# Patient Record
Sex: Female | Born: 1965 | Race: Black or African American | Hispanic: No | Smoking: Never smoker
Health system: Southern US, Community
[De-identification: ages and names within clinical notes are randomized; demographics above are authoritative.]

## PROBLEM LIST (undated history)

## (undated) DIAGNOSIS — K259 Gastric ulcer, unspecified as acute or chronic, without hemorrhage or perforation: Secondary | ICD-10-CM

## (undated) DIAGNOSIS — Z9289 Personal history of other medical treatment: Secondary | ICD-10-CM

## (undated) DIAGNOSIS — F4310 Post-traumatic stress disorder, unspecified: Secondary | ICD-10-CM

## (undated) DIAGNOSIS — K219 Gastro-esophageal reflux disease without esophagitis: Secondary | ICD-10-CM

## (undated) DIAGNOSIS — D573 Sickle-cell trait: Secondary | ICD-10-CM

## (undated) DIAGNOSIS — E785 Hyperlipidemia, unspecified: Secondary | ICD-10-CM

## (undated) DIAGNOSIS — K561 Intussusception: Secondary | ICD-10-CM

## (undated) DIAGNOSIS — G43909 Migraine, unspecified, not intractable, without status migrainosus: Secondary | ICD-10-CM

## (undated) DIAGNOSIS — M545 Low back pain, unspecified: Secondary | ICD-10-CM

## (undated) DIAGNOSIS — I10 Essential (primary) hypertension: Secondary | ICD-10-CM

## (undated) DIAGNOSIS — F329 Major depressive disorder, single episode, unspecified: Secondary | ICD-10-CM

## (undated) DIAGNOSIS — K8020 Calculus of gallbladder without cholecystitis without obstruction: Secondary | ICD-10-CM

## (undated) DIAGNOSIS — Z8489 Family history of other specified conditions: Secondary | ICD-10-CM

## (undated) DIAGNOSIS — I509 Heart failure, unspecified: Secondary | ICD-10-CM

## (undated) DIAGNOSIS — G8929 Other chronic pain: Secondary | ICD-10-CM

## (undated) DIAGNOSIS — Z87442 Personal history of urinary calculi: Secondary | ICD-10-CM

## (undated) DIAGNOSIS — F419 Anxiety disorder, unspecified: Secondary | ICD-10-CM

## (undated) DIAGNOSIS — M1990 Unspecified osteoarthritis, unspecified site: Secondary | ICD-10-CM

## (undated) DIAGNOSIS — D509 Iron deficiency anemia, unspecified: Secondary | ICD-10-CM

## (undated) DIAGNOSIS — F32A Depression, unspecified: Secondary | ICD-10-CM

## (undated) DIAGNOSIS — M5126 Other intervertebral disc displacement, lumbar region: Secondary | ICD-10-CM

## (undated) DIAGNOSIS — J189 Pneumonia, unspecified organism: Secondary | ICD-10-CM

## (undated) HISTORY — DX: Major depressive disorder, single episode, unspecified: F32.9

## (undated) HISTORY — DX: Anxiety disorder, unspecified: F41.9

## (undated) HISTORY — DX: Calculus of gallbladder without cholecystitis without obstruction: K80.20

## (undated) HISTORY — DX: Heart failure, unspecified: I50.9

## (undated) HISTORY — DX: Depression, unspecified: F32.A

## (undated) HISTORY — DX: Hyperlipidemia, unspecified: E78.5

---

## 1997-11-13 DIAGNOSIS — Z87442 Personal history of urinary calculi: Secondary | ICD-10-CM

## 1997-11-13 HISTORY — DX: Personal history of urinary calculi: Z87.442

## 1999-01-12 ENCOUNTER — Ambulatory Visit (HOSPITAL_COMMUNITY): Admission: RE | Admit: 1999-01-12 | Discharge: 1999-01-12 | Payer: Self-pay | Admitting: *Deleted

## 1999-01-26 ENCOUNTER — Encounter: Payer: Self-pay | Admitting: *Deleted

## 1999-01-26 ENCOUNTER — Ambulatory Visit (HOSPITAL_COMMUNITY): Admission: RE | Admit: 1999-01-26 | Discharge: 1999-01-26 | Payer: Self-pay | Admitting: *Deleted

## 1999-03-11 ENCOUNTER — Inpatient Hospital Stay (HOSPITAL_COMMUNITY): Admission: AD | Admit: 1999-03-11 | Discharge: 1999-03-11 | Payer: Self-pay | Admitting: *Deleted

## 1999-03-12 ENCOUNTER — Inpatient Hospital Stay (HOSPITAL_COMMUNITY): Admission: AD | Admit: 1999-03-12 | Discharge: 1999-03-14 | Payer: Self-pay | Admitting: *Deleted

## 1999-06-22 ENCOUNTER — Inpatient Hospital Stay (HOSPITAL_COMMUNITY): Admission: AD | Admit: 1999-06-22 | Discharge: 1999-06-22 | Payer: Self-pay | Admitting: *Deleted

## 1999-07-15 HISTORY — PX: TUBAL LIGATION: SHX77

## 1999-07-17 ENCOUNTER — Inpatient Hospital Stay (HOSPITAL_COMMUNITY): Admission: AD | Admit: 1999-07-17 | Discharge: 1999-07-21 | Payer: Self-pay | Admitting: *Deleted

## 1999-07-17 ENCOUNTER — Encounter (INDEPENDENT_AMBULATORY_CARE_PROVIDER_SITE_OTHER): Payer: Self-pay | Admitting: Specialist

## 1999-07-21 ENCOUNTER — Encounter (HOSPITAL_COMMUNITY): Admission: RE | Admit: 1999-07-21 | Discharge: 1999-10-19 | Payer: Self-pay | Admitting: *Deleted

## 1999-10-22 ENCOUNTER — Encounter (HOSPITAL_COMMUNITY): Admission: RE | Admit: 1999-10-22 | Discharge: 2000-01-20 | Payer: Self-pay | Admitting: *Deleted

## 2000-02-20 ENCOUNTER — Encounter: Admission: RE | Admit: 2000-02-20 | Discharge: 2000-05-20 | Payer: Self-pay | Admitting: *Deleted

## 2000-03-16 ENCOUNTER — Emergency Department (HOSPITAL_COMMUNITY): Admission: EM | Admit: 2000-03-16 | Discharge: 2000-03-16 | Payer: Self-pay | Admitting: Emergency Medicine

## 2000-03-16 ENCOUNTER — Encounter: Payer: Self-pay | Admitting: Emergency Medicine

## 2000-05-21 ENCOUNTER — Encounter: Admission: RE | Admit: 2000-05-21 | Discharge: 2000-08-19 | Payer: Self-pay | Admitting: *Deleted

## 2000-08-21 ENCOUNTER — Encounter: Admission: RE | Admit: 2000-08-21 | Discharge: 2000-11-19 | Payer: Self-pay | Admitting: *Deleted

## 2000-08-24 ENCOUNTER — Emergency Department (HOSPITAL_COMMUNITY): Admission: EM | Admit: 2000-08-24 | Discharge: 2000-08-24 | Payer: Self-pay | Admitting: Emergency Medicine

## 2000-11-21 ENCOUNTER — Encounter: Admission: RE | Admit: 2000-11-21 | Discharge: 2001-01-01 | Payer: Self-pay | Admitting: *Deleted

## 2001-03-27 ENCOUNTER — Ambulatory Visit (HOSPITAL_COMMUNITY): Admission: RE | Admit: 2001-03-27 | Discharge: 2001-03-27 | Payer: Self-pay | Admitting: Family Medicine

## 2001-03-27 ENCOUNTER — Encounter: Payer: Self-pay | Admitting: Family Medicine

## 2003-11-14 HISTORY — PX: LAPAROSCOPIC GASTRIC BYPASS: SUR771

## 2006-03-31 ENCOUNTER — Emergency Department (HOSPITAL_COMMUNITY): Admission: EM | Admit: 2006-03-31 | Discharge: 2006-04-01 | Payer: Self-pay | Admitting: Emergency Medicine

## 2006-11-13 HISTORY — PX: HERNIA REPAIR: SHX51

## 2006-11-13 HISTORY — PX: LAPAROSCOPIC TRANSABDOMINAL HERNIA: SHX5933

## 2007-04-15 ENCOUNTER — Inpatient Hospital Stay (HOSPITAL_COMMUNITY): Admission: EM | Admit: 2007-04-15 | Discharge: 2007-04-23 | Payer: Self-pay | Admitting: Emergency Medicine

## 2007-04-15 ENCOUNTER — Ambulatory Visit: Payer: Self-pay | Admitting: Hematology & Oncology

## 2007-04-26 ENCOUNTER — Ambulatory Visit: Payer: Self-pay | Admitting: Hematology & Oncology

## 2011-03-28 NOTE — Discharge Summary (Signed)
Julia Wheeler, ORNE                 ACCOUNT NO.:  [id number]   MEDICAL RECORD NO.:  [id number]          PATIENT TYPE:  INP   LOCATION:  5734                         FACILITY:  [facility]   PHYSICIAN:  Colin Benton, M.D.            DATE OF BIRTH:  02/12/1966   DATE OF ADMISSION:  04/14/2007  DATE OF DISCHARGE:  04/23/2007                               DISCHARGE SUMMARY   OPERATIVE PHYSICIAN:  Dr. Michaell Cowing.   DISCHARGING PHYSICIAN:  Dr. Colin Benton.   CONSULTANTS:  Dr. Myna Hidalgo with hematology.   CHIEF COMPLAINT/REASON FOR ADMISSION:  [name] is a 45 year old  female with a prior history of gastric bypass surgery with laparoscopic  approach in 2005.  She presented with two days of abrupt onset  intermittent crampy abdominal pain in the left upper abdomen radiating  to her back.  She has not passed any flatus since the onset of symptoms.  She presented to the ER because of her severe pain.  A CT of the abdomen  and pelvis revealed distal small bowel dilatation as well as small bowel  dilatation in the left abdomen with the largest diameter being 4.2 cm.  Surgical consultation was requested for admission.   PHYSICAL EXAMINATION:  VITAL SIGNS:  Stable.  She was afebrile.  ABDOMEN:  Slightly distended but soft.  No bowel sounds were  auscultated.  An NG tube had been placed by the ER staff but without a  significant amount of volume noted.  The patient had just recently  vomited.  There were no obvious herniations or scars in the abdominal  region.   LABORATORY DATA:  The patient's lab work was within normal limits except  for an elevated neutrophil count of 82%.  Urinalysis was positive for  nitrite, leukocyte esterase negative, wbc's in the urine 3-6, bacteria  many.  Urine culture pending.   DIAGNOSES:  The patient was admitted with the following diagnoses.  1. Small bowel obstruction with history of gastric bypass surgery.      Rule out adhesions versus internal hernia at bypass site.  2. Urinary  tract infection.  3. Volume depletion due to nausea and vomiting.  4. Anemia of uncertain etiology.   HOSPITAL COURSE:  The patient was admitted to the general floor where  she was kept on bowel rest, NG tube decompression, IV fluids. Labs were  checked as noted.  The patient did have abnormal hemoglobin of 8 on  admission, and after hydration hemoglobin dropped to 6.7.  The patient  was queried about this as a chronic problem.  She states she has been  diagnosed with anemia in the past and was supposed to be taking iron  pills, but had not.  She had also been fatigued at home.  She does have  sickle cell trait and denied any obvious sources of bleeding from the  gastrointestinal tract so hematology was consulted.   Dr. Myna Hidalgo did evaluate the patient, and basic workup  was initiated  per the hematology practice.  Initially they felt that the patient would  not need a  blood transfusion since she was hemodynamically stable with  normal blood pressure and normal heart rate.   In regards to patient's bowel obstruction after the first three days of  admission the patient seemed to be improving.  Her x-rays had improved  slightly.  She was passing flatus so her NG tube was clamped.  She had  also been placed on Cipro empirically for her UTI.  By hospital day #4  the patient's x-rays really had not improved when compared over the time  span.  She still had significant amount of small bowel that was dilated  up in the left upper quadrant, although the distal bowel obstruction  appeared to be resolved.  Therefore, after discussing with the patient  Dr. Michaell Cowing opted to take her to the OR where he did a laparoscopic repair  of internal hernia secondary to the patient's history of gastric bypass  procedure.  There were no adhesions that had to be lysed, and the  patient, otherwise, did well from this procedure.  The patient's  hemoglobin preoperatively was 7, and this stayed stable during the   immediate postoperative period.   In the postoperative period the patient did have an NG tube in place  because she had a mild postoperative ileus, but this was resolved by  postop day #3 and the patient had been started on a clear liquid diet.  She tolerated clear liquids well, but was tired of the sweet taste of  this type of diet so on postoperative day #4 she was advanced to a soft  diet and tolerated this well.   On postop day #4 hematology reevaluated the patient and now that she had  fully recovered from her bowel obstructive issues we were more able to  clarify if she was symptomatic from the anemia.  She reported to  Dr. Myna Hidalgo that she felt lousy.  She was dizzy upon standing and since  her hemoglobin was remaining at 7,  Dr. Myna Hidalgo felt that the patient would benefit from a blood transfusion  so on the evening of postoperative day  #4 the patient did receive 2 units of packed red blood cells.  She was  also treated with Tylenol, Benadryl, and Phenergan for any symptoms  related to the blood transfusion.   By postoperative day #5 the patient's hemoglobin had increased to 9.7  and she felt much better.  She had been tolerating the soft diet  advanced and is, otherwise, deemed appropriate for discharge home.  The  patient's urine culture it is important to note that was positive for  pansensitive E. coli.  The patient had been treated with the  fluoroquinolone since admission, and it was felt that she would not need  to continue any antibiotics after discharge since she was having no  symptoms of dysuria.   FINAL DISCHARGE DIAGNOSES:  1. Small bowel obstruction secondary to internal hernia x2 at her      gastric bypass anastomosis site.  2. Iron deficiency anemia in a patient with sickle cell trait status      post 2 units of packed red blood cells this admission.  3. Postoperative ileus resolved. 4. Pansensitive Escherichia coli urinary tract infection.   DISCHARGE  MEDICATIONS:  The patient will continue her multiple vitamin  and calcium that she was taking prior to admission.  She will be taking  Vicodin 5/325 1-2 tabs every four hours as needed for pain.  At this  point Dr. Myna Hidalgo does not wish to  initiate iron therapy.  Return to  work Monday, June 16.   DIET:  No restrictions.   WOUND CARE:  Remove bandages.   ACTIVITY:  Increase activity slowly.  May walk up steps.  May shower for  the next one week and then may begin tub baths after that if that is how  you normally bathe.  No lifting for one week more than 15 pounds.   FOLLOW-UP APPOINTMENTS:  1. You need to see Dr. Michaell Cowing, telephone number [phone].  You need to      call to be seen in two weeks.  2. You need to follow up with Dr. Myna Hidalgo, telephone number [phone].      You need to call to be seen in four weeks.      Allison L. Rolene Course.    ______________________________  Colin Benton, M.D.    ALE/MEDQ  D:  04/23/2007  T:  04/23/2007  Job:  [id number]   cc:   Ardeth Sportsman, MD  Rose Phi. Myna Hidalgo, M.D.

## 2011-03-28 NOTE — Consult Note (Signed)
NAMEVIRDIA, [name]                 ACCOUNT NO.:  [id number]   MEDICAL RECORD NO.:  [id number]          PATIENT TYPE:  INP   LOCATION:  5703                         FACILITY:  [facility]   PHYSICIAN:  Rose Phi. Myna Hidalgo, M.D. DATE OF BIRTH:  1966-10-29   DATE OF CONSULTATION:  04/17/2007  DATE OF DISCHARGE:                                 CONSULTATION   REFERRING PHYSICIAN:  Dr. Michaell Cowing of  [facility].   REASON FOR CONSULTATION:  1. Iron deficiency anemia.  2. Gastric bypass.  3. Sickle cell trait.   HISTORY OF PRESENT ILLNESS:  Julia Wheeler is a very nice 45 year old black  female who had gastric bypass in [place], [place], about 3 years  ago.  She has since moved to [place], [place], and she has not  had any kind of followup.  She has no family doctor.   She does have history of sickle cell trait.  She said that her brother  has it.   She has been chewing a lot of ice for the past several weeks.  She has  had heavy menstrual cycles for the past couple of years.  She is not a  vegetarian. Her appetite has been pretty good until recently, when she  began to have more abdominal pain.  She basically presented with  increasing abdominal pain.  She went to the emergency room.  She was  found to have small bowel dilation by CT scan.  She felt that there was  maybe a partial small bowel obstruction, likely from her gastric bypass  and adhesions.   On admission, she had a CBC with white blood cell count of 5.3,  hemoglobin 8, hematocrit 27, platelet count 341.  MCV 62.  She had a CBC  on April 16, 2007.  Her hemoglobin was 6.7, hematocrit 21.8.  Her MCV was  61.  The patient had an anemia panel done on April 16, 2007.  She had a  ferritin of 1, iron saturation 3%.  Her B12 and serum folate were  normal.  Her percent reticulocyte count when corrected for hemoglobin  was less than 0.2%.   She has had gastric aspirates from her NG tube, which have shown  positivity on two  occasions, and negative on one.   She has not been taking her iron pills that she had been supposed to.  She says that they cause constipation.   Note she also has an E. coli urinary tract infection on this admission.   We are asked to see her to help with the anemia.   She has about a 140 pound weight loss since her gastric bypass.   PAST MEDICAL HISTORY:  1. Morbid obesity, status post gastric bypass.  2. History of nephrolithiasis.  3. Sickle cell trait.   ALLERGIES:  None.   ADMISSION MEDICATIONS:  Calcium daily, multivitamins daily.  In the  hospital, she is on Cipro 400 mg IV b.i.d., Protonix 40 mg IV q.12  hours, Toradol as needed.   SOCIAL HISTORY:  Negative for tobacco use.  There is no significant  alcohol  use.  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  She has 3 children.   FAMILY HISTORY:  Remarkable for CVA.  Again, there is history of sickle  cell trait in the family.  She is not sure if anybody has iron  deficiency anemia.   REVIEW OF SYSTEMS:  As stated in the history of present illness.   PHYSICAL EXAMINATION:  GENERAL:  This is a fairly well-developed, well-  nourished black female in no obvious distress.  VITAL SIGNS:  Temperature 99.5, pulse is 89, respiratory rate 18, blood  pressure 140/83.  Weight is 140.  HEAD AND NECK:  Head shows a normocephalic, atraumatic skull.  There are  no ocular or oral lesions.  She does have pale conjunctivae.  Neck  supple, with no adenopathy.  Her thyroid is not palpable.  LUNGS:  Clear bilaterally.  CARDIAC:  Regular rate and rhythm.  Normal S1 and S2.  No murmurs, rubs,  or bruits.  ABDOMEN:  She has slightly distended abdomen.  She has occasional bowel  sounds.  There is some slight guarding.  No obvious palpable  hepatosplenomegaly is noted.  EXTREMITIES:  She has no clubbing, cyanosis or edema.  SKIN:  Examination shows no ecchymosis or petechiae.  NEUROLOGIC:  No obvious  neurological deficits.   LABORATORY DATA:  Additional laboratory studies show sodium of 132,  potassium 3.9, BUN 3, creatinine 0.7.  Total protein 7.8, albumin 3.9,  calcium 8.8.   Peripheral smear does show marked microcytic red blood cells.  She has  hypochromic red blood cells.  There is anisocytosis and poikilocytosis.  White cells appear normal in morphology and maturation.  There are no  immature myeloid cells.  There are no hypersegmented polynuclears.  Platelets are adequate in number and size.   IMPRESSION:  [name] is a 45 year old African American female with  sickle cell trait.  She has gastric bypass.  She now has partial small  bowel obstruction.  She has marked iron deficiency anemia.   She is a good candidate for IV iron.  IV iron is certainly the best, and  in my opinion, the only way to replace her iron stores.  She has marked  depletion of her iron stores.  This certainly has been a process going  on for several months, if not a couple of years.  She certainly is  adjusted to the low hemoglobin.  I do not think that she needs to have  blood transfusion at this point.  She is hemodynamically stable.   We will go ahead and give her IV iron on April 18, 2007.  Her total iron  replacement dose is 1900 mg.   I spoke with [name] and her husband.  They are both very nice.  They  do understand the situation.  I explained IV iron and how it is given.  I told [name] that she likely will not notice any benefit probably  for about a week or so.  Hopefully she will stop chewing ice, and begin  to have more energy.   We will certainly follow her along.  I do not see any need for any kind  of bone marrow test on her at this point.  All the laboratory studies  and her clinical scenario clearly indicate iron-deficiency anemia.   Will certainly follow up with [name] and plan to see her as an  outpatient.      Theron Arista  Tacy Dura, M.D. Electronically  Signed     PRE/MEDQ  D:  04/17/2007  T:  04/18/2007  Job:  [id number]   cc:   Ardeth Sportsman, MD

## 2011-03-28 NOTE — H&P (Signed)
NAMEJASLINE, Julia Wheeler NO.:  [id number]   MEDICAL RECORD NO.:  [id number]          PATIENT TYPE:  EMS   LOCATION:  MAJO                         FACILITY:  [facility]   PHYSICIAN:  Ardeth Sportsman, MD     DATE OF BIRTH:  10/16/1966   DATE OF ADMISSION:  04/14/2007  DATE OF DISCHARGE:                              HISTORY & PHYSICAL   CHIEF COMPLAINT:  Abdominal pain, probable SBO s/p Lap Gastric Bypass.   HISTORY OF PRESENT ILLNESS:  Ms. Chandonnet is a 45 year old female patient,  history of prior gastric surgery, laparoscopic in 2005.  About 2 days  ago, she developed abrupt onset of intermittent crampy abdominal pain,  more localized to the left abdomen, radiating towards her back.  She has  not passed any flatus.  She has not had any diarrhea.  Just prior to the  onset of these symptoms, she had noticed an increase of frequency in her  BMs.  She normally has 1 BM a day.  She was significant nausea and  vomiting and because of this and severe pain, she presented to the ER.  CT scan of the abdomen and pelvis was performed and demonstrated distal  small bowel dilatation, as well as small bowel dilatation in the left  abdomen with the largest diameter being 4.2 cm.  Based on these  findings, surgical consultation was requested.   PAST SURGICAL HISTORY:  1. Gastric bypass with laparoscopic approach in 2005, [facility].  Patient does not recall surgeon's name at this time.  2. Tubal ligation.   PAST MEDICAL HISTORY:  Renal calculi.  Chronic anemia.   FAMILY HISTORY:  Both parents have history of CVA.   SOCIAL HISTORY:  No alcohol.  No tobacco.  She works as a Psychologist, occupational.   DRUG ALLERGIES:  NKDA.   CURRENT MEDICATIONS:  Include:  1. Calcium daily.  2. Multivitamin daily.   REVIEW OF SYSTEMS:  Noncontributory, except for the following.  Eyes,  ENT, cardiac, respiratory, lymph, heme, heaptic, renal, endocrine,  neurological,  muskuloskeletal, allergic, dermatologic, gynecologic,  breast otherwise negative. GI as above. She has no problems related to  chronic nausea and vomiting.  No hematochezia, melena, hematemesis. She  tolerates normal sized portions at meals.  She has recently noticed an  unusual smell to her urine.   PHYSICAL EXAMINATION:  GENERAL:  A sleepy female patient, who when  aroused complains of severe abdominal pain.  VITAL SIGNS:  Temp 97.4.  BP 116/82.  Respirations 16.  Pulse 63.  NEURO:  Patient is awake.  She is oriented x3, moving all extremities x4  without any obvious focal neurological deficits.  ENT:  Head normocephalic.  Mucus membrances dry  Eyes: Sclerae noninjected, nonicteric.  PERRL, EOMI  NECK:  Supple.  No masses  Lymph: No adenopathy in head/neck/axillae/groins.  CHEST:  Bilateral lung sounds are clear to auscultation.  Respiratory  effort is nonlabored.  CARDIAC:  S1, S2 without obvious rubs, murmurs, thrills or gallops.  No  JVD.  Pulse is regular.  ABDOMEN:  Soft.  Slightly distended.  No bowel sounds are auscultated.  She has an NG tube that has been placed by the ER staff.  This is to  intermittent suction.  There is some yellow, brownish return noted,  small amount.  Patient has recently vomited.  No obvious herniations or  scars in the abdominal region. No peritonitis.  GEN: NEFM, no VB/VD  EXTREMITIES:  Symmetrical in appearance without edema, cyanosis or  clubbing.  MS:  FROM bilteral upper and lower extremities.   LAB/X-RAYS:  Sodium 137, potassium 4.0, CO2 26, BUN 9, creatinine 0.72,  glucose 135.  LFTs are normal.  White count 5300 with a neutrophil count  of 82%, hemoglobin is 8.0, hematocrit 27.0.  Urinalysis is positive for  nitrate.  Leukocyte esterase is negative.  White blood cell in the urine  is 3-6, bacteria is many.  A urine culture is pending.  CT scan as per  HPI.   IMPRESSION:  1. Small bowel obstruction in patient with prior history of  gastric      bypass surgery, possibly due to adhesions.  2. Urinary tract infection.  Urine culture pending.  3. Volume depletion secondary to nausea and vomiting.  4. Anemia.  Probably secondary to history of gastric bypass surgery.   PLAN:  1. Admit patient to general surgical services.  2. Bowel rest with NG tube, n.p.o. status and IV fluid hydration.  3. Cipro IV for urine infection.  4. Symptomatic treatment, i.e. Tylenol and Dilaudid for pain, Zofran      for nausea.  5. Because of the anemia, this is not a reported problem from the      patient, we will go ahead and check anemia indices and once she      begins having bowel movements we can consider checking stool for      blood, although with an obstructive pattern, patient may have a      false/positive for occult blood because of the processes related to      bowel obstruction.  6. Again, we will continue bowel rest for, at least, 3 days and check      plain films accordingly.      Allison L. Eliott Nine, MD  Electronically Signed    ALE/MEDQ  D:  04/15/2007  T:  04/15/2007  Job:  [id number]

## 2011-03-28 NOTE — Discharge Summary (Signed)
Julia Wheeler, Julia Wheeler                 ACCOUNT NO.:  [id number]   MEDICAL RECORD NO.:  [id number]          PATIENT TYPE:  INP   LOCATION:  5734                         FACILITY:  [facility]   PHYSICIAN:  Alfonse Ras, MD   DATE OF BIRTH:  Apr 28, 1966   DATE OF ADMISSION:  04/14/2007  DATE OF DISCHARGE:  04/23/2007                               DISCHARGE SUMMARY   DISCHARGING PHYSICIAN:  Alfonse Ras, M.D.   OPERATIVE PHYSICIAN:  Ardeth Sportsman, M.D.   CONSULTANTS:  Rose Phi. Myna Hidalgo, M.D. with hematology.   CHIEF COMPLAINT/REASON FOR ADMISSION:  [name] is a 40-year female  with history of prior gastric bypass surgery who 2 days prior to  admission developed intermittent crampy abdominal pain.  She presented  to the ER and on CT scan was found to have distal small bowel  obstruction as well as dilatation of small bowel in the left upper  abdomen with the largest diameter being 4.2 cm.  She was admitted by  surgery with a small bowel obstruction.  On initial exam, she was  afebrile, vital signs stable.  No bowel sounds.  Abdomen was slightly  distended.  Her labs were normal except for a hemoglobin of 8.0.  The  patient was admitted with the following diagnoses:  1. Small bowel obstruction in patient with a history of prior gastric      bypass surgery.  2. Urinary tract infection.  3. Volume depletion.  4. Anemia.   HOSPITAL COURSE:  The patient was admitted to the general floor where  she was kept on IV fluids, NG tube for bowel decompression and bowel  rest.  She had been started empirically on Cipro for a UTI, and after  hydration, the patient's hemoglobin fell to 6.7.  Because of this, I  talked additionally with the patient.  She states she has had a history  of microcytic anemia and has been on iron pills before but has not taken  them.  She has not had any history of any symptoms consistent with a GI  bleed and does have a history of sickle cell trait.  Because of  these  findings, hematology was consulted.  Dr. Myna Hidalgo evaluated the patient.   In regards to the patient's bowel obstruction, after about 4 days in the  hospital, she really was not improving much and her x-ray still  continued to show small bowel dilatation in the right upper quadrant.  Because of these findings, Dr. Michaell Cowing discussed with the patient the need  for surgical intervention, and she was taken to the OR on April 18, 2007  where she underwent a laparoscopic reduction and closure of internal  hernia secondary to internal hernias at her gastric bypass anastomosis  site.   In the immediate postoperative period, the patient had an NG tube in  place and had a mild postoperative ileus.  This resolved by  postoperative day #3, where she was started on clear liquids, and by  postop day #4, she was tolerating clears but did not like the taste.  She was passing flatus,  so she was advanced to a soft diet.  By  postoperative day #5, from a surgical standpoint, the patient was deemed  appropriate for discharge home.   In regards to the patient's anemia, as noted, Dr. Myna Hidalgo evaluated the  patient.  Her anemia indices were abnormal with a ferritin of 1, iron of  13, TIBC 385, MCV was in the mid to low 60s.  The patient initially  seemed to be tolerating her anemia well without any hypotension or  tachycardia, although it was difficult to a determine if she was having  any other symptoms because she was also feeling poorly from the bowel  obstruction and recent surgery.  On postoperative day #4, Dr. Myna Hidalgo  did come by and evaluate the patient in the afternoon, and she was  complaining of feeling lousy, having headaches and vertigo, and since  her hemoglobin remained stable around 7, he felt that at this point she  would probably benefit from transfusion before being discharged home.  She did receive 2 units of packed red blood cells on postoperative day  #4, and by postoperative day #5,  her hemoglobin was up to 9.7 and she  felt much better.  At this point, Dr. Myna Hidalgo did not feel that the  patient needed any oral iron therapy at home and was to follow up with  him in 1 month in the office.   In regards to the patient's UTI, she had been placed empirically on  Cipro IV.  Cultures subsequently grew pansensitive E-coli.  The patient  remained on the fluoroquinolone until date of discharge.  Since she was  asymptomatic and she had been on antibiotics for greater than 7 days, it  was felt that the UTI was adequately treated.   DISCHARGE DIAGNOSES:  1. Abdominal pain and small bowel obstruction secondary to internal      hernias at gastric bypass anastomosis site.  2. Status post laparoscopic assisted reduction and closure of internal      hernias x2.  3. Chronic microcytic anemia status post packed red blood cell      transfusion.  4. Escherichia coli urinary tract infection, pansensitive.   DISCHARGE MEDICATIONS:  1. The patient will continue the multiple vitamin and calcium she was      on prior to admission.  2. Vicodin 5/325, 1-2 tablets every 4 hours as needed for pain.   Return to work Monday, April 29, 2007.   DIET:  No restrictions.   WOUND CARE:  Remove bandages.   ACTIVITY:  Increase activity slowly.  May walk up steps.  May shower.  No lifting for 1 week greater than 15 pounds.   FOLLOWUP:  1. Dr. Michaell Cowing, [phone].  Call to be seen in 2 weeks.  2. Dr. Myna Hidalgo, [phone].  Call to be seen in 4 weeks.      Allison L. Rolene Course      Alfonse Ras, MD  Electronically Signed   ALE/MEDQ  D:  04/23/2007  T:  04/23/2007  Job:  [id number]   cc:   Ardeth Sportsman, MD  Rose Phi Myna Hidalgo, M.D.

## 2011-03-28 NOTE — Op Note (Signed)
Julia Wheeler, Julia Wheeler                 ACCOUNT NO.:  [id number]   MEDICAL RECORD NO.:  [id number]          PATIENT TYPE:  INP   LOCATION:  5703                         FACILITY:  [facility]   PHYSICIAN:  Ardeth Sportsman, MD     DATE OF BIRTH:  1966-09-06   DATE OF PROCEDURE:  DATE OF DISCHARGE:                               OPERATIVE REPORT   SURGEON:  Ardeth Sportsman, MD.   ASSISTANT SURGEON:  None.   PREOPERATIVE DIAGNOSIS:  Small bowel obstruction with a history of  laparoscopic gastric bypass.   POSTOPERATIVE DIAGNOSIS:  1. Small bowel obstruction with a history of laparoscopic gastric      bypass.  2. Internal hernias x two, incarcerated.   PROCEDURE PERFORMED:  1. Diagnostic laparoscopy.  2. Reduction of internal hernias and closure of mesenteric hernia      defects.   ANESTHESIA:  1. General anesthesia.  2. Local anesthetic in a field block around all port sites.   SPECIMENS:  None.   DRAINS:  None.   ESTIMATED BLOOD LOSS:  Less than 5 mL.   COMPLICATIONS:  None apparent.   INDICATIONS FOR PROCEDURE:  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  This was done in [facility] and she has moved to the area more recently.  I do not think she  has obtained a primary care physician at this time.  She had noted some  abdominal pain, nausea and vomiting and was admitted a few days ago on  our service for bowel obstruction.  Unfortunately, she did not improve.  Because nonoperative management had not improved with IV fluid and p.o.  nasogastric decompression, the options were discussed and recommendation  was made for a diagnostic laparoscopy with possible lysis of adhesions  and possible revision of anastomoses, possible closure and repair of  internal hernias.  Other differential diagnoses were discussed.   The risks, such as stroke, heart attack, deep venous  thrombosis,  pulmonary embolism and death were discussed.  Risks such as bleeding,  need for transfusion, wound infection, abscess, injury to other organs,  bowel injury, reoperation, incisional hernia, conversion to open, and  prolonged pain and other risks were discussed.  Questions were answered  and the patient agreed to proceed.   OPERATIVE FINDINGS:  The patient did not really have any significant  adhesions.  She did have a mesenteric defect from her antecolic limb of  the gastro jejunostomy to the jejuno jejunostomy.  That did not seem to  be particularly incarcerated, although there were some small bowel loops  within it.  I think the area of concern was the jejunal mesenteric  defect from the other limb of the jejuno jejunostomy.  There was  otherwise no evidence of any significant abnormalities in her abdomen.   DESCRIPTION OF PROCEDURE:  Informed consent was confirmed.  The patient  was already on IV antibiotics.  She had sequential compression devices  active during the entire case.  She underwent general  anesthesia without  difficulty.  She underwent general anesthesia without difficulty.  She  was positioned supine with both arms tucked.  A Foley catheter had been  placed.  Her abdomen was prepped and draped in the sterile fashion.   Entry was gained into the abdomen;  I initially attempted through  optical entry with the patient in steep reverse Trendelenburg, right  side up, through a stab incision and using a 5 mm / 0-degree scope.  However, on attempting, I could not see the planes very well and was not  comfortable with the amount of the pressure that was needed to help  delineate the planes.  I had stayed within the anterior abdominal wall  and therefore I decided to enter through a Veress technique.   An infraumbilical curvilinear incision was made.  A towel clamp was used  to hold the umbilical stalk for fascial countertraction.  The Veress was  passed into the  abdomen on the first pass easily and there was no  aspiration of blood or bile and it flushed well.  Capnoperitoneum to 15  mm Hg provided good abdominal insufflation.  A 5 mm port was placed.  Camera inspection revealed no intraabdominal injury and no evidence of  any bruising or injury from the attempted optical entry in the right  upper quadrant.  Under direct visualization, 5 mm ports were placed in  the right upper quadrant and in the left midabdomen.   Obviously dilated loops could be seen on the right side, and it was not  easy to tell the exact transition point.  Therefore, I decided to run  the bowel from the ileocecal valve at the terminal ileum more proximally  to help untwist things.  Small bowel was run and there were no interloop  adhesions.  I came to the jejunojejunostomy and, getting close to the  jejuno jejunostomy, it was obvious that there were loops of bowel that  were tucked within an internal hernia, primarily between the jejunal  mesenteric defect from the jejunojejunostomy.  Ultimately this was  reduced and run back all the way.  The biliopancreatic limb was run all  the way to the ligament of Treitz and appeared to be normal.  The  gastrojejunal limb was run from the jejunojejunostomy all the way to the  gastrojejunostomy and was seen to be nice and intact.  There was obvious  mesenteric defect between the gastrojejunostomy and the  jejunojejunostomy, although there did not seem to the source of  incarceration.  There was boggy dilated bowel, but there was no evidence  of any blood or any ischemia or necrosis.   Mesenteric defects were closed using a 2-0 silk running stitch, first  from the gastrojejunostomy all the way to the jejunojejunostomy and the  antecolic limb, taking it back to help secure that antecolic small bowel  limb to the stomach, transverse colon, and ultimately the  retroperitoneum to help close that defect down well.  The jejunal mesenteric  defect was closed from the jejunojejunostomy down to the  retroperitoneum.   The small bowel was again run from the terminal ileum to the jejuno  jejunostomy with no twisting or turning or incarceration.  Both limbs  were run from the jejunojejunostomy to the ligament of Treitz on one  limb, and the jejunojejunostomy to the gastrojejunostomy, and there was  no twisting or turning or incarceration.  This seemed to show adequate  closure of the internal hernias.  Camera inspection revealed no  intraabdominal injury, no leaking of bowel, no evidence of any bowel  injury or serosal tears.  The upper two abdominal ports were removed  with no evidence of leaking of blood on the peritoneal or skin side.   The capnoperitoneum was completely evacuated.  All ports were 5 mm and  there was no fascial defect requiring any fascial closure, and therefore  the skin was closed using a 4-0 Monocryl stitch.  A sterile dressing was  applied.  The patient was extubated and sent to the recovery room in  stable condition.  I made attempts to try and reach the family, but I  cannot get a hold of anybody.  I will continue to try and do that.      Ardeth Sportsman, MD  Electronically Signed     SCG/MEDQ  D:  04/18/2007  T:  04/18/2007  Job:  [id number]

## 2011-08-31 LAB — BASIC METABOLIC PANEL
BUN: 3 — ABNORMAL LOW
BUN: 4 — ABNORMAL LOW
BUN: 45 — ABNORMAL HIGH
BUN: 5 — ABNORMAL LOW
CO2: 30
Calcium: 8.5
Calcium: 8.8
Chloride: 100
Chloride: 102
Chloride: 103
Creatinine, Ser: 0.74
Creatinine, Ser: 0.89
GFR calc Af Amer: >60
GFR calc non Af Amer: >60
GFR calc non Af Amer: >60
Glucose, Bld: 100 — ABNORMAL HIGH
Glucose, Bld: 90
Glucose, Bld: 94
Glucose, Bld: 98
Potassium: 4.1
Sodium: 132 — ABNORMAL LOW
Sodium: 136

## 2011-08-31 LAB — CBC
HCT: 24.4 — ABNORMAL LOW
Hemoglobin: 6.7 — CL
Hemoglobin: 8 — ABNORMAL LOW
MCHC: 29.7 — ABNORMAL LOW
MCHC: 30
MCV: 62.5 — ABNORMAL LOW
MCV: 63.2 — ABNORMAL LOW
MCV: 68.3 — ABNORMAL LOW
MCV: 87.3
Platelets: 239
Platelets: 272
Platelets: 341
RBC: 4.32
RDW: 17.5 — ABNORMAL HIGH
RDW: 18.4 — ABNORMAL HIGH
WBC: 4.6
WBC: 6
WBC: 8.1

## 2011-08-31 LAB — CROSSMATCH: ABO/RH(D): O POS

## 2011-08-31 LAB — COMPREHENSIVE METABOLIC PANEL
ALT: 19
CO2: 26
Calcium: 9.5
GFR calc non Af Amer: >60
Glucose, Bld: 135 — ABNORMAL HIGH
Sodium: 137
Total Bilirubin: 0.6

## 2011-08-31 LAB — URINALYSIS, ROUTINE W REFLEX MICROSCOPIC
Hgb urine dipstick: NEGATIVE
Leukocytes, UA: NEGATIVE
Nitrite: POSITIVE — AB

## 2011-08-31 LAB — DIFFERENTIAL
Basophils Absolute: 0
Eosinophils Absolute: 0
Lymphocytes Relative: 14
Lymphs Abs: 0.7
Monocytes Absolute: 0.2
Neutro Abs: 4.4

## 2011-08-31 LAB — HEMOGLOBIN AND HEMATOCRIT, BLOOD
HCT: 22.6 — ABNORMAL LOW
HCT: 23.3 — ABNORMAL LOW

## 2011-08-31 LAB — URINE MICROSCOPIC-ADD ON

## 2011-08-31 LAB — IRON AND TIBC
Iron: 16 — ABNORMAL LOW
Saturation Ratios: 3 — ABNORMAL LOW
Saturation Ratios: 4 — ABNORMAL LOW
TIBC: 384
TIBC: 402
UIBC: 371

## 2011-08-31 LAB — VITAMIN B12: Vitamin B-12: 407 (ref 211–911)

## 2011-08-31 LAB — AMMONIA: Ammonia: 23

## 2011-08-31 LAB — SAVE SMEAR

## 2011-08-31 LAB — URINE CULTURE

## 2011-08-31 LAB — OCCULT BLOOD GASTRIC / DUODENUM (SPECIMEN CUP): Occult Blood, Gastric: NEGATIVE

## 2011-08-31 LAB — POCT PREGNANCY, URINE: Preg Test, Ur: NEGATIVE

## 2011-08-31 LAB — FOLATE: Folate: 14.5

## 2011-08-31 LAB — GASTRIC OCCULT BLOOD (1-CARD TO LAB): Occult Blood, Gastric: POSITIVE — AB

## 2012-12-08 ENCOUNTER — Encounter (HOSPITAL_COMMUNITY): Payer: Self-pay | Admitting: Emergency Medicine

## 2012-12-08 ENCOUNTER — Emergency Department (HOSPITAL_COMMUNITY)
Admission: EM | Admit: 2012-12-08 | Discharge: 2012-12-08 | Disposition: A | Discharge status: Home / Self Care | Payer: Self-pay | Attending: Emergency Medicine | Admitting: Emergency Medicine

## 2012-12-08 ENCOUNTER — Emergency Department (HOSPITAL_COMMUNITY): Payer: Self-pay

## 2012-12-08 DIAGNOSIS — R110 Nausea: Secondary | ICD-10-CM | POA: Insufficient documentation

## 2012-12-08 DIAGNOSIS — N83209 Unspecified ovarian cyst, unspecified side: Secondary | ICD-10-CM | POA: Insufficient documentation

## 2012-12-08 DIAGNOSIS — D573 Sickle-cell trait: Secondary | ICD-10-CM | POA: Insufficient documentation

## 2012-12-08 DIAGNOSIS — Z3202 Encounter for pregnancy test, result negative: Secondary | ICD-10-CM | POA: Insufficient documentation

## 2012-12-08 DIAGNOSIS — R350 Frequency of micturition: Secondary | ICD-10-CM | POA: Insufficient documentation

## 2012-12-08 DIAGNOSIS — R197 Diarrhea, unspecified: Secondary | ICD-10-CM | POA: Insufficient documentation

## 2012-12-08 DIAGNOSIS — N390 Urinary tract infection, site not specified: Secondary | ICD-10-CM | POA: Insufficient documentation

## 2012-12-08 HISTORY — DX: Sickle-cell trait: D57.3

## 2012-12-08 LAB — CBC WITH DIFFERENTIAL/PLATELET
Basophils Relative: 0 % (ref 0–1)
Eosinophils Relative: 0 % (ref 0–5)
Hemoglobin: 8.9 g/dL — ABNORMAL LOW (ref 12.0–15.0)
Lymphs Abs: 0.5 10*3/uL — ABNORMAL LOW (ref 0.7–4.0)
MCH: 20.1 pg — ABNORMAL LOW (ref 26.0–34.0)
MCHC: 31 g/dL (ref 30.0–36.0)
MCV: 64.8 fL — ABNORMAL LOW (ref 78.0–100.0)
Monocytes Absolute: 0.4 10*3/uL (ref 0.1–1.0)
Platelets: 268 10*3/uL (ref 150–400)
RBC: 4.43 MIL/uL (ref 3.87–5.11)

## 2012-12-08 LAB — URINE MICROSCOPIC-ADD ON

## 2012-12-08 LAB — COMPREHENSIVE METABOLIC PANEL
ALT: 13 U/L (ref 0–35)
AST: 19 U/L (ref 0–37)
Alkaline Phosphatase: 87 U/L (ref 39–117)
CO2: 25 mEq/L (ref 19–32)
Calcium: 9.1 mg/dL (ref 8.4–10.5)
GFR calc non Af Amer: >90 mL/min (ref >90)
Potassium: 4.3 mEq/L (ref 3.5–5.1)
Sodium: 135 mEq/L (ref 135–145)

## 2012-12-08 LAB — URINALYSIS, ROUTINE W REFLEX MICROSCOPIC
Bilirubin Urine: NEGATIVE
Glucose, UA: NEGATIVE mg/dL
Hgb urine dipstick: NEGATIVE
Specific Gravity, Urine: 1.015 (ref 1.005–1.030)
Urobilinogen, UA: 0.2 mg/dL (ref 0.0–1.0)
pH: 5.5 (ref 5.0–8.0)

## 2012-12-08 LAB — PREGNANCY, URINE: Preg Test, Ur: NEGATIVE

## 2012-12-08 MED ORDER — IOHEXOL 300 MG/ML  SOLN
100.0000 mL | Freq: Once | INTRAMUSCULAR | Status: AC | PRN
  Administered 2012-12-08: 100 mL via INTRAVENOUS

## 2012-12-08 MED ORDER — DEXTROSE 5 % IV SOLN
1.0000 g | Freq: Once | INTRAVENOUS | Status: AC
  Administered 2012-12-08: 1 g via INTRAVENOUS
  Filled 2012-12-08: qty 10

## 2012-12-08 MED ORDER — CIPROFLOXACIN HCL 500 MG PO TABS: 500.0000 mg | ORAL_TABLET | Freq: Two times a day (BID) | ORAL | Status: DC

## 2012-12-08 MED ORDER — IOHEXOL 300 MG/ML  SOLN
50.0000 mL | Freq: Once | INTRAMUSCULAR | Status: AC | PRN
  Administered 2012-12-08: 50 mL via ORAL

## 2012-12-08 MED ORDER — PROMETHAZINE HCL 25 MG PO TABS: 25.0000 mg | ORAL_TABLET | Freq: Four times a day (QID) | ORAL | Status: DC | PRN

## 2012-12-08 MED ORDER — ONDANSETRON HCL 4 MG/2ML IJ SOLN
4.0000 mg | Freq: Once | INTRAMUSCULAR | Status: AC
  Administered 2012-12-08: 4 mg via INTRAVENOUS
  Filled 2012-12-08: qty 2

## 2012-12-08 NOTE — ED Notes (Signed)
Left sided flank rads to back  On and off for months worse in the last month  Has not seen a  Dr  Has had frequency  Has had hx of kidney stones when she was preg, odor to urine for months, no vag d/c some diarrhea

## 2012-12-08 NOTE — ED Provider Notes (Signed)
History     CSN: [id number]  Arrival date & time 12/08/12  1000   First MD Initiated Contact with Patient 12/08/12 1002      No chief complaint on file.   (Consider location/radiation/quality/duration/timing/severity/associated sxs/prior treatment) HPI Comments: Patient with a history of Gastric Bypass and SBO secondary to internal abdominal hernia presents today with left sided abdominal pain.  Pain primary located in the LUQ, but radiates to her left flank area and down to her LLQ.  She reports that the pain has been present intermittently for the past few months, but was worse today.  She describes the pain as a twisting pain.  Pain gradual in onset.  She reports that it feels similar to when she was diagnosed with a SBO in the past.  She denies fever or chills.  Denies hematuria.  She has had increased urinary frequency and has noticed a strong odor to her urine over the past couple of weeks.  She denies fever or chills.  She has been nauseous, but no vomiting.  She has had intermittent diarrhea over the past couple of weeks.    The history is provided by the patient.    Past Medical History  Diagnosis Date  . Sickle cell trait     Past Surgical History  Procedure Date  . Gastric bypass   . Hernia repair     No family history on file.  History  Substance Use Topics  . Smoking status: Never Smoker   . Smokeless tobacco: Not on file  . Alcohol Use: Yes    OB History    Grav Para Term Preterm Abortions TAB SAB Ect Mult Living                  Review of Systems  Constitutional: Negative for fever and chills.  Gastrointestinal: Positive for nausea, abdominal pain and diarrhea. Negative for vomiting, constipation and abdominal distention.  Genitourinary: Positive for frequency and flank pain. Negative for dysuria, urgency, hematuria, vaginal bleeding, vaginal discharge, difficulty urinating and pelvic pain.       Urine odorous  Neurological: Negative for dizziness,  syncope and light-headedness.  All other systems reviewed and are negative.    Allergies  Morphine and related  Home Medications  No current outpatient prescriptions on file.  BP 148/97  Pulse 92  Temp 98.4 F (36.9 C) (Oral)  Resp 18  SpO2 100%  Physical Exam  Nursing note and vitals reviewed. Constitutional: She appears well-developed and well-nourished. No distress.  HENT:  Head: Normocephalic and atraumatic.  Mouth/Throat: Oropharynx is clear and moist.  Neck: Normal range of motion. Neck supple.  Cardiovascular: Normal rate, regular rhythm and normal heart sounds.   Pulmonary/Chest: Effort normal and breath sounds normal. No respiratory distress. She has no wheezes. She has no rales.  Abdominal: Soft. Normal appearance and bowel sounds are normal. She exhibits no distension and no mass. There is tenderness in the left upper quadrant and left lower quadrant. There is no rebound, no guarding and no CVA tenderness.  Musculoskeletal: Normal range of motion.  Neurological: She is alert.  Skin: Skin is warm and dry. She is not diaphoretic.  Psychiatric: She has a normal mood and affect.    ED Course  Procedures (including critical care time)  Labs Reviewed - No data to display Ct Abdomen Pelvis W Contrast  12/08/2012  *RADIOLOGY REPORT*  Clinical Data: Left flank pain, back pain  CT ABDOMEN AND PELVIS WITH CONTRAST  Technique:  Multidetector  CT imaging of the abdomen and pelvis was performed following the standard protocol during bolus administration of intravenous contrast.  Contrast: OMNIPAQUE IOHEXOL 300 MG/ML  SOLN, 50mL OMNIPAQUE IOHEXOL 300 MG/ML  SOLN  Comparison: 04/15/2007  Findings: Sagittal images of the spine are unremarkable.  The patient is status post gastric bypass surgery.  Small hiatal hernia is noted.  Lung bases are unremarkable.  The liver, spleen, pancreas and adrenal glands are unremarkable.  No calcified gallstones are noted within gallbladder.  No  aortic aneurysm.  Kidneys are symmetrical in size and enhancement.  No focal renal mass.  No hydronephrosis or hydroureter.  Delayed renal images shows bilateral renal symmetrical excretion. Tiny cyst is noted in medial aspect of the right kidney measures 5 mm.  Oral contrast material was given to the patient.  No small bowel obstruction.  No ascites or free air.  No adenopathy.  There is no pericecal inflammation.  Normal appendix is partially visualized in axial image 60.  A retroflexed uterus is noted.  Small amount of free fluid noted posterior cul-de-sac.  There is a  probable hemorrhagic follicle within left ovary measures 2.1 cm.  Pelvic phleboliths are noted. The urinary bladder is unremarkable.  IMPRESSION:  1.  Status post gastric bypass surgery.  Small hiatal hernia. 2.  No hydronephrosis or hydroureter. 3.  Normal appendix is partially visualized. 4.  Retroflexed uterus.  Probable hemorrhagic follicle within left ovary measures 2.1 cm.  Small amount of pelvic free fluid noted posterior cul-de-sac.   Original Report Authenticated By: Natasha Mead, M.D.      No diagnosis found.    MDM  Patient presenting with intermittent LUQ and LLQ abdominal pain for the past couple of months, worse today.  Due to the patient's history of SBO and Gastric Bypass a CT ab/pelvis was ordered, which shows a hemorrhagic follicle in the left ovary, but is otherwise negative.  Patient informed of results and given referral to Gynecology.  Patient also found to have a UTI.  Patient given dose of IV Ceftriaxone and discharged home with antibiotics.   Return precautions given.        Pascal Lux Snook, PA-C 12/09/12 1233

## 2012-12-08 NOTE — ED Notes (Signed)
Pt up to br to void and get ua which was sent

## 2012-12-09 NOTE — ED Provider Notes (Signed)
Medical screening examination/treatment/procedure(s) were performed by non-physician practitioner and as supervising physician I was immediately available for consultation/collaboration.   Richardean Canal, MD 12/09/12 [phone]

## 2012-12-10 LAB — URINE CULTURE: Colony Count: >=100000

## 2012-12-11 NOTE — ED Notes (Signed)
+   urine Patient treated with Cipro-sensitive to same-chart appended per protocol MD. 

## 2014-05-17 ENCOUNTER — Inpatient Hospital Stay (HOSPITAL_COMMUNITY)
Admission: EM | Admit: 2014-05-17 | Discharge: 2014-05-22 | DRG: 357 | Disposition: A | Discharge status: Home / Self Care | Payer: Medicaid Other | Attending: Gastroenterology | Admitting: Gastroenterology

## 2014-05-17 ENCOUNTER — Emergency Department (HOSPITAL_COMMUNITY): Payer: Medicaid Other

## 2014-05-17 ENCOUNTER — Encounter (HOSPITAL_COMMUNITY): Payer: Self-pay | Admitting: Emergency Medicine

## 2014-05-17 DIAGNOSIS — Z885 Allergy status to narcotic agent status: Secondary | ICD-10-CM

## 2014-05-17 DIAGNOSIS — K921 Melena: Secondary | ICD-10-CM

## 2014-05-17 DIAGNOSIS — Z9884 Bariatric surgery status: Secondary | ICD-10-CM

## 2014-05-17 DIAGNOSIS — K8020 Calculus of gallbladder without cholecystitis without obstruction: Secondary | ICD-10-CM | POA: Diagnosis present

## 2014-05-17 DIAGNOSIS — K561 Intussusception: Principal | ICD-10-CM

## 2014-05-17 DIAGNOSIS — K922 Gastrointestinal hemorrhage, unspecified: Secondary | ICD-10-CM | POA: Diagnosis present

## 2014-05-17 DIAGNOSIS — D573 Sickle-cell trait: Secondary | ICD-10-CM | POA: Diagnosis present

## 2014-05-17 DIAGNOSIS — D62 Acute posthemorrhagic anemia: Secondary | ICD-10-CM | POA: Diagnosis present

## 2014-05-17 DIAGNOSIS — Z87440 Personal history of urinary (tract) infections: Secondary | ICD-10-CM

## 2014-05-17 DIAGNOSIS — I10 Essential (primary) hypertension: Secondary | ICD-10-CM | POA: Diagnosis present

## 2014-05-17 DIAGNOSIS — D509 Iron deficiency anemia, unspecified: Secondary | ICD-10-CM | POA: Diagnosis present

## 2014-05-17 DIAGNOSIS — G8918 Other acute postprocedural pain: Secondary | ICD-10-CM | POA: Diagnosis not present

## 2014-05-17 DIAGNOSIS — R1012 Left upper quadrant pain: Secondary | ICD-10-CM

## 2014-05-17 DIAGNOSIS — D571 Sickle-cell disease without crisis: Secondary | ICD-10-CM | POA: Diagnosis present

## 2014-05-17 HISTORY — DX: Essential (primary) hypertension: I10

## 2014-05-17 HISTORY — DX: Iron deficiency anemia, unspecified: D50.9

## 2014-05-17 LAB — COMPREHENSIVE METABOLIC PANEL
ALT: 11 U/L (ref 0–35)
AST: 17 U/L (ref 0–37)
Albumin: 3.4 g/dL — ABNORMAL LOW (ref 3.5–5.2)
Alkaline Phosphatase: 65 U/L (ref 39–117)
Anion gap: 12 (ref 5–15)
BILIRUBIN TOTAL: 0.4 mg/dL (ref 0.3–1.2)
BUN: 24 mg/dL — ABNORMAL HIGH (ref 6–23)
CALCIUM: 8.7 mg/dL (ref 8.4–10.5)
CHLORIDE: 103 meq/L (ref 96–112)
CO2: 25 meq/L (ref 19–32)
Creatinine, Ser: 0.71 mg/dL (ref 0.50–1.10)
GLUCOSE: 123 mg/dL — AB (ref 70–99)
Potassium: 4 mEq/L (ref 3.7–5.3)
SODIUM: 140 meq/L (ref 137–147)
Total Protein: 6.6 g/dL (ref 6.0–8.3)

## 2014-05-17 LAB — LIPASE, BLOOD: Lipase: 19 U/L (ref 11–59)

## 2014-05-17 LAB — CBC
HCT: 22.7 % — ABNORMAL LOW (ref 36.0–46.0)
HEMOGLOBIN: 7 g/dL — AB (ref 12.0–15.0)
MCH: 21.7 pg — AB (ref 26.0–34.0)
MCHC: 30.8 g/dL (ref 30.0–36.0)
MCV: 70.5 fL — AB (ref 78.0–100.0)
Platelets: 217 10*3/uL (ref 150–400)
RBC: 3.22 MIL/uL — AB (ref 3.87–5.11)
RDW: 17.4 % — ABNORMAL HIGH (ref 11.5–15.5)
WBC: 6.2 10*3/uL (ref 4.0–10.5)

## 2014-05-17 MED ORDER — SODIUM CHLORIDE 0.9 % IV BOLUS (SEPSIS)
1000.0000 mL | Freq: Once | INTRAVENOUS | Status: AC
  Administered 2014-05-17: 1000 mL via INTRAVENOUS

## 2014-05-17 MED ORDER — PANTOPRAZOLE SODIUM 40 MG IV SOLR
40.0000 mg | Freq: Every day | INTRAVENOUS | Status: DC
  Administered 2014-05-17 – 2014-05-19 (×3): 40 mg via INTRAVENOUS
  Filled 2014-05-17 (×5): qty 40

## 2014-05-17 MED ORDER — ONDANSETRON HCL 4 MG/2ML IJ SOLN
4.0000 mg | Freq: Four times a day (QID) | INTRAMUSCULAR | Status: DC | PRN
  Administered 2014-05-19 – 2014-05-20 (×2): 4 mg via INTRAVENOUS
  Filled 2014-05-17 (×2): qty 2

## 2014-05-17 MED ORDER — FENTANYL CITRATE 0.05 MG/ML IJ SOLN
25.0000 ug | Freq: Once | INTRAMUSCULAR | Status: AC
  Administered 2014-05-17: 25 ug via INTRAVENOUS
  Filled 2014-05-17: qty 2

## 2014-05-17 MED ORDER — IOHEXOL 300 MG/ML  SOLN
25.0000 mL | Freq: Once | INTRAMUSCULAR | Status: AC | PRN
  Administered 2014-05-17: 25 mL via ORAL

## 2014-05-17 MED ORDER — KCL IN DEXTROSE-NACL 20-5-0.45 MEQ/L-%-% IV SOLN
INTRAVENOUS | Status: DC
  Administered 2014-05-17 – 2014-05-22 (×9): via INTRAVENOUS
  Filled 2014-05-17 (×17): qty 1000

## 2014-05-17 MED ORDER — FENTANYL CITRATE 0.05 MG/ML IJ SOLN
25.0000 ug | INTRAMUSCULAR | Status: DC | PRN
  Administered 2014-05-18 – 2014-05-22 (×7): 25 ug via INTRAVENOUS
  Filled 2014-05-17 (×7): qty 2

## 2014-05-17 MED ORDER — ONDANSETRON HCL 4 MG/2ML IJ SOLN
4.0000 mg | Freq: Once | INTRAMUSCULAR | Status: AC
  Administered 2014-05-17: 4 mg via INTRAVENOUS
  Filled 2014-05-17: qty 2

## 2014-05-17 MED ORDER — IOHEXOL 300 MG/ML  SOLN
100.0000 mL | Freq: Once | INTRAMUSCULAR | Status: AC | PRN
  Administered 2014-05-17: 100 mL via INTRAVENOUS

## 2014-05-17 NOTE — ED Notes (Signed)
PT returned from CT

## 2014-05-17 NOTE — ED Notes (Signed)
MD Wyatt at bedside.

## 2014-05-17 NOTE — ED Notes (Signed)
Attempted to call report x 1  

## 2014-05-17 NOTE — ED Notes (Signed)
Md Lockwood at bedside.  

## 2014-05-17 NOTE — ED Provider Notes (Signed)
CSN: [id number]     Arrival date & time 05/17/14  1413 History   First MD Initiated Contact with Patient 05/17/14 1504     Chief Complaint  Patient presents with  . Melena  . Abdominal Pain      HPI  Patient presents with concerns of abdominal pain, rectal bleeding, nausea. Symptoms began approximately 3 weeks ago, initially with left-sided abdominal pain.  Persistent in that regard. Soon after the onset of pain, patient also developed nausea.  She has had episodes of retching. Today she has had  3 episodes of bright red blood per rectum she denies any syncope, chest pain, dyspnea. Patient has a history of gastric bypass, Roux-en-Y, as well as prior hernia repair.   Past Medical History  Diagnosis Date  . Sickle cell trait   . Hypertension    Past Surgical History  Procedure Laterality Date  . Gastric bypass    . Hernia repair     No family history on file. History  Substance Use Topics  . Smoking status: Never Smoker   . Smokeless tobacco: Not on file  . Alcohol Use: Yes   OB History   Grav Para Term Preterm Abortions TAB SAB Ect Mult Living                 Review of Systems  Constitutional:       Per HPI, otherwise negative  HENT:       Per HPI, otherwise negative  Respiratory:       Per HPI, otherwise negative  Cardiovascular:       Per HPI, otherwise negative  Gastrointestinal: Positive for nausea, vomiting, abdominal pain and blood in stool.  Endocrine:       Negative aside from HPI  Genitourinary:       Neg aside from HPI   Musculoskeletal:       Per HPI, otherwise negative  Skin: Negative.   Neurological: Positive for dizziness and light-headedness. Negative for syncope.      Allergies  Morphine and related  Home Medications   Prior to Admission medications   Not on File   BP 128/94  Pulse 98  Temp(Src) 98.9 F (37.2 C) (Oral)  Resp 18  SpO2 100% Physical Exam  Nursing note and vitals reviewed. Constitutional: She is oriented to  person, place, and time. She appears well-developed and well-nourished. No distress.  HENT:  Head: Normocephalic and atraumatic.  Eyes: Conjunctivae and EOM are normal.  Cardiovascular: Normal rate and regular rhythm.   Pulmonary/Chest: Effort normal and breath sounds normal. No stridor. No respiratory distress.  Abdominal: Soft. Normal appearance. She exhibits no distension. There is no hepatosplenomegaly. There is tenderness in the left upper quadrant and left lower quadrant. There is no rebound and no CVA tenderness.  Musculoskeletal: She exhibits no edema.  Neurological: She is alert and oriented to person, place, and time. No cranial nerve deficit.  Skin: Skin is warm and dry.  Psychiatric: She has a normal mood and affect.    ED Course  Procedures (including critical care time) Labs Review Labs Reviewed  CBC - Abnormal; Notable for the following:    RBC 3.22 (*)    Hemoglobin 7.0 (*)    HCT 22.7 (*)    MCV 70.5 (*)    MCH 21.7 (*)    RDW 17.4 (*)    All other components within normal limits  COMPREHENSIVE METABOLIC PANEL - Abnormal; Notable for the following:    Glucose, Bld 123 (*)  BUN 24 (*)    Albumin 3.4 (*)    All other components within normal limits  LIPASE, BLOOD  POC OCCULT BLOOD, ED  TYPE AND SCREEN    Imaging Review Ct Abdomen Pelvis W Contrast  05/17/2014   CLINICAL DATA:  LUQ pain, Hx of gastric bypass  EXAM: CT ABDOMEN AND PELVIS WITH CONTRAST  TECHNIQUE: Multidetector CT imaging of the abdomen and pelvis was performed using the standard protocol following bolus administration of intravenous contrast.  CONTRAST:  OMNIPAQUE IOHEXOL 300 MG/ML  SOLN  COMPARISON:  CT and pelvis 12/08/2012  FINDINGS: The lung bases are negative.  The liver, spleen, adrenals, pancreas, kidneys are unremarkable. Stable small benign cyst lower pole right kidney. Patient is status post gastric bypass.  A small bowel intussusception within the left upper quadrant image 38 series  2.  There is no evidence of bowel obstruction. Air field and contrast filled loops of small bowel are appreciated.  The bowel is otherwise negative. The appendix is negative. Small echogenic gallstones within the dependent portion the gallbladder.  Otherwise no evidence of abdominal or pelvic masses, free fluid, loculated fluid collections, nor adenopathy.  There is no abdominal aortic aneurysm. The celiac, SMA, IMA, portal vein, SMV are opacified.  A small fat containing periumbilical hernia is appreciated. No evidence of an inguinal hernia. There are no aggressive appearing osseous lesions.  IMPRESSION: Small bowel intussusception left upper quadrant. No evidence associated bowel obstruction. This can be an incidental transient finding, a small mass lead point cannot be excluded. The patient has complaints left upper quadrant pain and further evaluation with GI consultation recommended.  No further evidence of obstructive or inflammatory abnormalities.  The gallstones within the gallbladder and benign cyst right kidney.   Electronically Signed   By: Salome Holmes M.D.   On: 05/17/2014 18:04    Update: Patient's pain completely resolved with fentanyl. After the previous CT scan I discussed the patient's case with our general surgical team. Review of the patients notes demonstrate that prior hernia repair, bowel structures in a similar region as prior intussusception.   MDM   Patient presents with new rectal bleeding, abdominal pain.  Patient is mildly tachycardic, but afebrile, with a soft abdomen and otherwise reassuring physical exam. Patient's evaluation demonstrates presence of intussusception. Patient was admitted for further evaluation and management of her pain was controlled in the emergency department.     Gerhard Munch, MD 05/17/14 2032

## 2014-05-17 NOTE — ED Notes (Signed)
Per EMS: Pt reports 3 episodes of bloody stools toda. States she has also had some left sided abdominal pain, denies N/V/D. Reports intermittent dizziness, but denies at this time.  Hx of hernia repair, HTN, gastric bypass. ST 120. 158/100.

## 2014-05-17 NOTE — H&P (Signed)
Julia Wheeler is an 48 y.o. female.   Chief Complaint: The patient came to the emergency department with rectal bleeding. HPI: The patient has had several weeks of nagging left upper quadrant abdominal discomfort. She is also chronically anemic. She is status post gastric bypass with normal we anastomosis approximately 3 years ago done in [place].  The patient has not seen a physician in this area for the last 3 years. She has been healthy with the exception of chronic anemia. This nagging left upper quadrant abdominal pain got worse today when she started having bright red blood per rectum followed by 2 prairie dog maroon colored bowel movements associated with bowel movements. The patient also got very lightheaded. She had a near syncopal episode. With this she came into the emergency department for evaluation.  A CT scan of the abdomen and pelvis demonstrated an area in the left upper quadrant of possible intussusception. This is possibly chronic and is nonobstructed.  Past Medical History  Diagnosis Date  . Sickle cell trait   . Hypertension   . Sickle cell anemia     Past Surgical History  Procedure Laterality Date  . Gastric bypass    . Hernia repair      No family history on file. Social History:  reports that she has never smoked. She does not have any smokeless tobacco history on file. She reports that she drinks alcohol. Her drug history is not on file.  Allergies:  Allergies  Allergen Reactions  . Morphine And Related     Makes me crazy     (Not in a hospital admission)  Results for orders placed during the hospital encounter of 05/17/14 (from the past 48 hour(s))  CBC     Status: Abnormal   Collection Time    05/17/14  2:27 PM      Result Value Ref Range   WBC 6.2  4.0 - 10.5 K/uL   RBC 3.22 (*) 3.87 - 5.11 MIL/uL   Hemoglobin 7.0 (*) 12.0 - 15.0 g/dL   HCT 22.7 (*) 36.0 - 46.0 %   MCV 70.5 (*) 78.0 - 100.0 fL   MCH 21.7 (*) 26.0 - 34.0 pg   MCHC  30.8  30.0 - 36.0 g/dL   RDW 17.4 (*) 11.5 - 15.5 %   Platelets 217  150 - 400 K/uL  COMPREHENSIVE METABOLIC PANEL     Status: Abnormal   Collection Time    05/17/14  2:27 PM      Result Value Ref Range   Sodium 140  137 - 147 mEq/L   Potassium 4.0  3.7 - 5.3 mEq/L   Chloride 103  96 - 112 mEq/L   CO2 25  19 - 32 mEq/L   Glucose, Bld 123 (*) 70 - 99 mg/dL   BUN 24 (*) 6 - 23 mg/dL   Creatinine, Ser 0.71  0.50 - 1.10 mg/dL   Calcium 8.7  8.4 - 10.5 mg/dL   Total Protein 6.6  6.0 - 8.3 g/dL   Albumin 3.4 (*) 3.5 - 5.2 g/dL   AST 17  0 - 37 U/L   ALT 11  0 - 35 U/L   Alkaline Phosphatase 65  39 - 117 U/L   Total Bilirubin 0.4  0.3 - 1.2 mg/dL   GFR calc non Af Amer >90  >90 mL/min   GFR calc Af Amer >90  >90 mL/min   Comment: (NOTE)     The eGFR has been  calculated using the CKD EPI equation.     This calculation has not been validated in all clinical situations.     eGFR's persistently <90 mL/min signify possible Chronic Kidney     Disease.   Anion gap 12  5 - 15  LIPASE, BLOOD     Status: None   Collection Time    05/17/14  2:27 PM      Result Value Ref Range   Lipase 19  11 - 59 U/L  TYPE AND SCREEN     Status: None   Collection Time    05/17/14  3:20 PM      Result Value Ref Range   ABO/RH(D) O POS     Antibody Screen NEG     Sample Expiration 05/20/2014     Ct Abdomen Pelvis W Contrast  05/17/2014   CLINICAL DATA:  LUQ pain, Hx of gastric bypass  EXAM: CT ABDOMEN AND PELVIS WITH CONTRAST  TECHNIQUE: Multidetector CT imaging of the abdomen and pelvis was performed using the standard protocol following bolus administration of intravenous contrast.  CONTRAST:  174m OMNIPAQUE IOHEXOL 300 MG/ML  SOLN  COMPARISON:  CT and pelvis 12/08/2012  FINDINGS: The lung bases are negative.  The liver, spleen, adrenals, pancreas, kidneys are unremarkable. Stable small benign cyst lower pole right kidney. Patient is status post gastric bypass.  A small bowel intussusception within the left  upper quadrant image 38 series 2.  There is no evidence of bowel obstruction. Air field and contrast filled loops of small bowel are appreciated.  The bowel is otherwise negative. The appendix is negative. Small echogenic gallstones within the dependent portion the gallbladder.  Otherwise no evidence of abdominal or pelvic masses, free fluid, loculated fluid collections, nor adenopathy.  There is no abdominal aortic aneurysm. The celiac, SMA, IMA, portal vein, SMV are opacified.  A small fat containing periumbilical hernia is appreciated. No evidence of an inguinal hernia. There are no aggressive appearing osseous lesions.  IMPRESSION: Small bowel intussusception left upper quadrant. No evidence associated bowel obstruction. This can be an incidental transient finding, a small mass lead point cannot be excluded. The patient has complaints left upper quadrant pain and further evaluation with GI consultation recommended.  No further evidence of obstructive or inflammatory abnormalities.  The gallstones within the gallbladder and benign cyst right kidney.   Electronically Signed   By: HMargaree MackintoshM.D.   On: 05/17/2014 18:04    Review of Systems  Constitutional: Negative.   HENT: Negative.   Eyes: Negative.   Respiratory: Negative.   Gastrointestinal: Negative for abdominal pain (currnelty the patient has abdominal pain.).  Musculoskeletal: Negative.   Skin: Negative.   Neurological: Positive for dizziness (mild lieghtheadedness earlier today with bleeding).    Blood pressure 145/91, pulse 83, temperature 98.9 F (37.2 C), temperature source Oral, resp. rate 18, last menstrual period 05/16/2014, SpO2 100.00%. Physical Exam  Constitutional: She is oriented to person, place, and time. She appears well-developed and well-nourished.  HENT:  Head: Normocephalic and atraumatic.  Eyes: Conjunctivae and EOM are normal. Pupils are equal, round, and reactive to light.  Mild proptosis bilaterally.  Neck:  Normal range of motion. Neck supple.  Cardiovascular: Normal rate, regular rhythm and normal heart sounds.   Respiratory: Effort normal and breath sounds normal.  GI: Soft. Bowel sounds are normal.  No palpable masses.  Musculoskeletal: Normal range of motion.  Neurological: She is alert and oriented to person, place, and time. She has normal reflexes.  Skin: Skin is warm.  Psychiatric: She has a normal mood and affect. Her behavior is normal. Judgment and thought content normal.     Assessment/Plan In addition to examining the patient I have reviewed her CT scan which does show a small segment of what appears to be intussuscepted bowel in the left upper quadrant. This correlates with her symptoms of abdominal discomfort in the left upper quadrant. However the patient's symptoms preceded today's admission to the emergency department.  The patient does not appear to have any ischemic bowel based on clinical examination. She is completely nontender to examination and she's only received 25 mcg of IV fentanyl.  I am reluctant to send the patient home the cause of the rectal bleeding and also because of her anemia. Although she is chronically anemic in the 9 range she rarely drops down into the 7 range which is where she is now.  I would type and screen the patient, admit her for IV hydration, and then discuss the case with the surgeon, next week for possible laparoscopic exploration and did a reduction of intussusception with small bowel resection. I explained this to the patient and her husband. They understand. She will be allowed to take some ice chips and water tonight but n.p.o. After midnight.  Gwenyth Ober 05/17/2014, 7:42 PM

## 2014-05-17 NOTE — ED Notes (Signed)
CT made aware that pt finished oral contrast  

## 2014-05-18 ENCOUNTER — Encounter (HOSPITAL_COMMUNITY): Payer: Self-pay | Admitting: General Surgery

## 2014-05-18 DIAGNOSIS — K921 Melena: Secondary | ICD-10-CM | POA: Diagnosis present

## 2014-05-18 DIAGNOSIS — G8918 Other acute postprocedural pain: Secondary | ICD-10-CM | POA: Diagnosis not present

## 2014-05-18 DIAGNOSIS — D509 Iron deficiency anemia, unspecified: Secondary | ICD-10-CM | POA: Diagnosis present

## 2014-05-18 DIAGNOSIS — R1012 Left upper quadrant pain: Secondary | ICD-10-CM | POA: Diagnosis present

## 2014-05-18 DIAGNOSIS — K922 Gastrointestinal hemorrhage, unspecified: Secondary | ICD-10-CM | POA: Diagnosis present

## 2014-05-18 DIAGNOSIS — Z87440 Personal history of urinary (tract) infections: Secondary | ICD-10-CM | POA: Diagnosis not present

## 2014-05-18 DIAGNOSIS — K625 Hemorrhage of anus and rectum: Secondary | ICD-10-CM | POA: Diagnosis present

## 2014-05-18 DIAGNOSIS — I10 Essential (primary) hypertension: Secondary | ICD-10-CM | POA: Diagnosis present

## 2014-05-18 DIAGNOSIS — D571 Sickle-cell disease without crisis: Secondary | ICD-10-CM | POA: Diagnosis present

## 2014-05-18 DIAGNOSIS — Z9884 Bariatric surgery status: Secondary | ICD-10-CM | POA: Diagnosis not present

## 2014-05-18 DIAGNOSIS — D62 Acute posthemorrhagic anemia: Secondary | ICD-10-CM

## 2014-05-18 DIAGNOSIS — K8020 Calculus of gallbladder without cholecystitis without obstruction: Secondary | ICD-10-CM | POA: Diagnosis present

## 2014-05-18 DIAGNOSIS — K561 Intussusception: Secondary | ICD-10-CM | POA: Diagnosis present

## 2014-05-18 DIAGNOSIS — Z885 Allergy status to narcotic agent status: Secondary | ICD-10-CM | POA: Diagnosis not present

## 2014-05-18 LAB — BASIC METABOLIC PANEL
ANION GAP: 11 (ref 5–15)
BUN: 19 mg/dL (ref 6–23)
CO2: 26 mEq/L (ref 19–32)
Calcium: 8.9 mg/dL (ref 8.4–10.5)
Chloride: 101 mEq/L (ref 96–112)
Creatinine, Ser: 0.79 mg/dL (ref 0.50–1.10)
GFR calc non Af Amer: >90 mL/min (ref >90)
Glucose, Bld: 86 mg/dL (ref 70–99)
POTASSIUM: 4 meq/L (ref 3.7–5.3)
Sodium: 138 mEq/L (ref 137–147)

## 2014-05-18 LAB — CBC
HCT: 19.5 % — ABNORMAL LOW (ref 36.0–46.0)
HEMATOCRIT: 24.7 % — AB (ref 36.0–46.0)
HEMOGLOBIN: 8.1 g/dL — AB (ref 12.0–15.0)
Hemoglobin: 6.3 g/dL — CL (ref 12.0–15.0)
MCH: 22.1 pg — ABNORMAL LOW (ref 26.0–34.0)
MCH: 24.3 pg — ABNORMAL LOW (ref 26.0–34.0)
MCHC: 31.3 g/dL (ref 30.0–36.0)
MCHC: 32.8 g/dL (ref 30.0–36.0)
MCV: 70.7 fL — ABNORMAL LOW (ref 78.0–100.0)
MCV: 74.2 fL — ABNORMAL LOW (ref 78.0–100.0)
PLATELETS: 192 10*3/uL (ref 150–400)
Platelets: 185 10*3/uL (ref 150–400)
RBC: 2.76 MIL/uL — AB (ref 3.87–5.11)
RBC: 3.33 MIL/uL — ABNORMAL LOW (ref 3.87–5.11)
RDW: 17.8 % — ABNORMAL HIGH (ref 11.5–15.5)
RDW: 17.6 % — AB (ref 11.5–15.5)
WBC: 5.8 10*3/uL (ref 4.0–10.5)
WBC: 6.7 10*3/uL (ref 4.0–10.5)

## 2014-05-18 LAB — PREPARE RBC (CROSSMATCH)

## 2014-05-18 MED ORDER — HYDROCODONE-ACETAMINOPHEN 5-325 MG PO TABS
1.0000 | ORAL_TABLET | ORAL | Status: DC | PRN
  Administered 2014-05-18 – 2014-05-21 (×8): 2 via ORAL
  Filled 2014-05-18 (×8): qty 2

## 2014-05-18 MED ORDER — PEG-KCL-NACL-NASULF-NA ASC-C 100 G PO SOLR
1.0000 | Freq: Once | ORAL | Status: AC
  Administered 2014-05-18: 200 g via ORAL
  Filled 2014-05-18: qty 1

## 2014-05-18 NOTE — Progress Notes (Signed)
Less distention, decreased LUQ pain since yesterday. Had a small bowel movement - possibly with some gross blood Receiving transfusion now. GI to perform EGD/ colonoscopy tomorrow. Patient interested in having Dr. Michaell Cowing perform surgery, if indicated.  Julia Wheeler. Julia Skains, MD, [facility]  General/ Trauma Surgery  05/18/2014 3:04 PM

## 2014-05-18 NOTE — Progress Notes (Signed)
[facility] Progress Note     Subjective: Pt c/o pain is still significant, but improved from yesterday.  No N/V.  Abdominal pain still in LUQ and lower abdomen feels "bloated/pressure".  No more bloody BM's since admission, had 3 prior to coming to the hospital.    Objective: Vital signs in last 24 hours: Temp:  [97.9 F (36.6 C)-98.9 F (37.2 C)] 98 F (36.7 C) (07/06 0617) Pulse Rate:  [78-114] 86 (07/06 0617) Resp:  [16-18] 16 (07/06 0617) BP: (122-145)/(80-98) 124/82 mmHg (07/06 0617) SpO2:  [99 %-100 %] 99 % (07/06 0617) Weight:  [175 lb 6.4 oz (79.561 kg)] 175 lb 6.4 oz (79.561 kg) (07/05 2122)    Intake/Output from previous day: 07/05 0701 - 07/06 0700 In: 712.7 [I.V.:711.7] Out: 300 [Urine:300] Intake/Output this shift:    PE: Gen:  Alert, NAD, pleasant Card:  RRR, no M/G/R heard Pulm:  CTA, no W/R/R Abd: Soft, mild distension, moderate tenderness in LUQ, mild tenderness in suprapubic region, +BS, no HSM Ext:  No erythema, edema, or tenderness Skin/mucous membranes dry and pale  Lab Results:   Recent Labs  05/17/14 1427 05/18/14 0500  WBC 6.2 5.8  HGB 7.0* 6.3*  HCT 22.7* 19.5*  PLT 217 192   BMET  Recent Labs  05/17/14 1427 05/18/14 0500  NA 140 138  K 4.0 4.0  CL 103 101  CO2 25 26  GLUCOSE 123* 86  BUN 24* 19  CREATININE 0.71 0.79  CALCIUM 8.7 8.9   PT/INR No results found for this basename: LABPROT, INR,  in the last 72 hours CMP     Component Value Date/Time   NA 138 05/18/2014 0500   K 4.0 05/18/2014 0500   CL 101 05/18/2014 0500   CO2 26 05/18/2014 0500   GLUCOSE 86 05/18/2014 0500   BUN 19 05/18/2014 0500   CREATININE 0.79 05/18/2014 0500   CALCIUM 8.9 05/18/2014 0500   PROT 6.6 05/17/2014 1427   ALBUMIN 3.4* 05/17/2014 1427   AST 17 05/17/2014 1427   ALT 11 05/17/2014 1427   ALKPHOS 65 05/17/2014 1427   BILITOT 0.4 05/17/2014 1427   GFRNONAA >90 05/18/2014 0500   GFRAA >90 05/18/2014 0500   Lipase     Component Value Date/Time    LIPASE 19 05/17/2014 1427       Studies/Results: Ct Abdomen Pelvis W Contrast  05/17/2014   CLINICAL DATA:  LUQ pain, Hx of gastric bypass  EXAM: CT ABDOMEN AND PELVIS WITH CONTRAST  TECHNIQUE: Multidetector CT imaging of the abdomen and pelvis was performed using the standard protocol following bolus administration of intravenous contrast.  CONTRAST:  OMNIPAQUE IOHEXOL 300 MG/ML  SOLN  COMPARISON:  CT and pelvis 12/08/2012  FINDINGS: The lung bases are negative.  The liver, spleen, adrenals, pancreas, kidneys are unremarkable. Stable small benign cyst lower pole right kidney. Patient is status post gastric bypass.  A small bowel intussusception within the left upper quadrant image 38 series 2.  There is no evidence of bowel obstruction. Air field and contrast filled loops of small bowel are appreciated.  The bowel is otherwise negative. The appendix is negative. Small echogenic gallstones within the dependent portion the gallbladder.  Otherwise no evidence of abdominal or pelvic masses, free fluid, loculated fluid collections, nor adenopathy.  There is no abdominal aortic aneurysm. The celiac, SMA, IMA, portal vein, SMV are opacified.  A small fat containing periumbilical hernia is appreciated. No evidence of an inguinal hernia. There are no  aggressive appearing osseous lesions.  IMPRESSION: Small bowel intussusception left upper quadrant. No evidence associated bowel obstruction. This can be an incidental transient finding, a small mass lead point cannot be excluded. The patient has complaints left upper quadrant pain and further evaluation with GI consultation recommended.  No further evidence of obstructive or inflammatory abnormalities.  The gallstones within the gallbladder and benign cyst right kidney.   Electronically Signed   By: Salome HolmesHector  Cooper M.D.   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 Michaell CowingGross) Sickle cell trait - 1 yr ago Hgb 8.9 H/o Iron deficiency anemia  Plan: 1.  NPO, bowel rest, IVF pain control, antiemetics 2.  Ask GI to consult and eval for upper and lower endoscopy to identify the source of the bleeding and possible reduction of the intussusception.  [facility] to see.  Await their recommendations. 3.  Getting 2 units pRBC's, will recheck CBC after administartion 4.  Ambulate once Hgb immproved and IS 5.  SCD's and hold pharm dvt proph due to bleeding    LOS: 1 day    DORT, Thadius Smisek 05/18/2014, 7:58 AM Pager: [phone]

## 2014-05-18 NOTE — Consult Note (Signed)
[facility] Consult: 11:53 AM 05/18/2014  LOS: 1 day    Referring Provider: Dr Lindie Spruce.   Primary Care Physician:  None for many years Primary Gastroenterologist:  unassigned     Reason for Consultation:  Rectal bleeding and anemia   HPI: Julia Wheeler is a 48 y.o. female.  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  S/p 04/2007 laparoscopic repair of 2 internal hernias.  Has sickle cell trait and long hx of anemia.  In 2008 Hgb was 6.7 and Ferritin 1 during surgery admission, transfused 2 units blood and infused with parenteral iron.  Seen by Dr Myna Hidalgo, but never followed up with him as was the MDs intent. Hx E coli UTI in 2008 and 11/2012.   3 weeks of left abdominal pain. Intensely worse after eating and drinking so po intake down and lost 12 # over last 10 days.  astarting at 3 Am yesterday, again at 11 AM and in afternoon: passed bloody and tarry stool.  Intense pain on left.  Dry heaves but no emesis. Admitted from ED.   CT scan:   Small bowel intussusception left upper quadrant. No evidence  associated bowel obstruction. This can be an incidental transient  finding, a small mass lead point cannot be excluded.  Incidental gallstones noted.   Hgb of 7.0 yesterday, to 6.3 today.  MCV of 70. In Jan 2014 Hgb was 8.9 and MCV was 64.  Getting transfused, 2 units ordered.  Gastric occult blood is negative.  Lots of bacteria, + nittites and some WBCs on urine. No abx yet.        Past Medical History  Diagnosis Date  . Sickle cell trait   . Hypertension   . Sickle cell anemia   . Iron deficiency anemia     Past Surgical History  Procedure Laterality Date  . Gastric bypass      In [place], [place]  . Hernia repair  2008    [name] (internal hernia)     Prior to Admission medications   Not on File    Scheduled Meds: . pantoprazole (PROTONIX) IV  40 mg Intravenous QHS   Infusions: . dextrose 5 % and 0.45 % NaCl with KCl 20 mEq/L 100 mL/hr at 05/17/14 2253   PRN Meds: fentaNYL, ondansetron   Allergies as of 05/17/2014 - Review Complete 05/17/2014  Allergen Reaction Noted  . Morphine and related  12/08/2012    No family history on file.  History   Social History  . Marital Status: Single    Spouse Name: N/A    Number of Children: N/A  . Years of Education: N/A   Occupational History  . Not on file.   Social History Main Topics  . Smoking status: Never Smoker   . Smokeless tobacco: Not on file  . Alcohol Use: Yes  . Drug Use: Not on file  . Sexual Activity: Not on file   Other Topics Concern  . Not on file   Social History Narrative  . No narrative  on file    REVIEW OF SYSTEMS: Constitutional:  Per HPI ENT:  No nose bleeds Pulm:  No dyspnea or cough CV:  No palpitations, no LE edema.  GU:  No hematuria, no frequency GI:  Per HPI Heme:  Per HPI   Transfusions:  Per HPI Neuro:  No headaches, no peripheral tingling or numbness.  Supposed to wear distance glasses, does not have and also has pressure on left eye Derm:  No itching, no rash or sores.  Endocrine:  No sweats or chills.  No polyuria or dysuria Immunization:  None for many years    PHYSICAL EXAM: Vital signs in last 24 hours: Filed Vitals:   05/18/14 1120  BP: 125/82  Pulse: 81  Temp: 98.2 F (36.8 C)  Resp: 18   Wt Readings from Last 3 Encounters:  05/17/14 79.561 kg (175 lb 6.4 oz)    General: looks somewhat chronically unwell Head:  No sweling or asymmetryu  Eyes:  No icterus or pallor Ears:  Not HOH  Nose:  No discharge Mouth:  Clear, moist  Neck:  No mass, no TMG, no JVD Lungs:  Clear bil Heart: RRR.  No MRG Abdomen:  Soft, tender without guarding on left and in suprpubis  No mass, no HSM Rectal: deferred,just passed  maroonish stool.  Musc/Skeltl: no joint swelling or deformity Extremities:  No edema  Neurologic:  orinted x 3.  No limb weakness or tremor Skin:  No telangectasia or rash Tattoos:  none Nodes:  No cervical adenopathy   Psych:  Pleasant, relaxed.   Intake/Output from previous day: 07/05 0701 - 07/06 0700 In: 712.7 [I.V.:711.7] Out: 300 [Urine:300] Intake/Output this shift: Total I/O In: 20 [I.V.:20] Out: -   LAB RESULTS:  Recent Labs  05/17/14 1427 05/18/14 0500  WBC 6.2 5.8  HGB 7.0* 6.3*  HCT 22.7* 19.5*  PLT 217 192   BMET Lab Results  Component Value Date   NA 138 05/18/2014   NA 140 05/17/2014   NA 135 12/08/2012   K 4.0 05/18/2014   K 4.0 05/17/2014   K 4.3 12/08/2012   CL 101 05/18/2014   CL 103 05/17/2014   CL 97 12/08/2012   CO2 26 05/18/2014   CO2 25 05/17/2014   CO2 25 12/08/2012   GLUCOSE 86 05/18/2014   GLUCOSE 123* 05/17/2014   GLUCOSE 90 12/08/2012   BUN 19 05/18/2014   BUN 24* 05/17/2014   BUN 12 12/08/2012   CREATININE 0.79 05/18/2014   CREATININE 0.71 05/17/2014   CREATININE 0.79 12/08/2012   CALCIUM 8.9 05/18/2014   CALCIUM 8.7 05/17/2014   CALCIUM 9.1 12/08/2012   LFT  Recent Labs  05/17/14 1427  PROT 6.6  ALBUMIN 3.4*  AST 17  ALT 11  ALKPHOS 65  BILITOT 0.4   PT/INR No results found for this basename: INR, PROTIME   Lipase     Component Value Date/Time   LIPASE 19 05/17/2014 1427    RADIOLOGY STUDIES: Ct Abdomen Pelvis W Contrast  05/17/2014   CLINICAL DATA:  LUQ pain, Hx of gastric bypass  EXAM: CT ABDOMEN AND PELVIS WITH CONTRAST  TECHNIQUE: Multidetector CT imaging of the abdomen and pelvis was performed using the standard protocol following bolus administration of intravenous contrast.  CONTRAST:  100mL OMNIPAQUE IOHEXOL 300 MG/ML  SOLN  COMPARISON:  CT and pelvis 12/08/2012  FINDINGS: The lung bases are negative.  The liver, spleen, adrenals, pancreas, kidneys are unremarkable. Stable small benign cyst lower pole right kidney. Patient  is status post  gastric bypass.  A small bowel intussusception within the left upper quadrant image 38 series 2.  There is no evidence of bowel obstruction. Air field and contrast filled loops of small bowel are appreciated.  The bowel is otherwise negative. The appendix is negative. Small echogenic gallstones within the dependent portion the gallbladder.  Otherwise no evidence of abdominal or pelvic masses, free fluid, loculated fluid collections, nor adenopathy.  There is no abdominal aortic aneurysm. The celiac, SMA, IMA, portal vein, SMV are opacified.  A small fat containing periumbilical hernia is appreciated. No evidence of an inguinal hernia. There are no aggressive appearing osseous lesions.  IMPRESSION: Small bowel intussusception left upper quadrant. No evidence associated bowel obstruction. This can be an incidental transient finding, a small mass lead point cannot be excluded. The patient has complaints left upper quadrant pain and further evaluation with GI consultation recommended.  No further evidence of obstructive or inflammatory abnormalities.  The gallstones within the gallbladder and benign cyst right kidney.   Electronically Signed   By: Salome Holmes M.D.   On: 05/17/2014 18:04    ENDOSCOPIC STUDIES: None ever  IMPRESSION:   *  GI bleed.  Rule out upper vs lower GI bleed  *  SB intususception  *  UTI?   *  2005 Gastric bypass  *  2008 lap repair of internal hernias.    PLAN:     *  egd and colonoscopy tomorrow.  Hopefully will be able to prep.  *  Needs urine culture.    Jennye Moccasin  05/18/2014, 11:53 AM Pager: [phone]    ________________________________________________________________________  [facility] MD note:  I personally examined the patient, reviewed the data and agree with the assessment and plan described above.  Given her altered anatomy, I think it is unlikely that I will be able to advance upper scope to the level of the intussusception but will certainly try.   That site may be the location of her bleeding, EGD and colonoscopy will be useful to check for other possible etiologies.  Will plan on egd/colonoscopy tomorrow.     Rob Bunting, MD [facility] Pager [phone]

## 2014-05-18 NOTE — Progress Notes (Signed)
Utilization Review Completed.Bobi Daudelin T7/04/2014  

## 2014-05-19 ENCOUNTER — Encounter (HOSPITAL_COMMUNITY): Payer: Self-pay | Admitting: *Deleted

## 2014-05-19 ENCOUNTER — Encounter (HOSPITAL_COMMUNITY): Admission: EM | Disposition: A | Discharge status: Home / Self Care | Payer: Self-pay | Source: Home / Self Care

## 2014-05-19 ENCOUNTER — Inpatient Hospital Stay (HOSPITAL_COMMUNITY): Payer: Medicaid Other

## 2014-05-19 HISTORY — PX: COLONOSCOPY: SHX5424

## 2014-05-19 HISTORY — PX: ESOPHAGOGASTRODUODENOSCOPY: SHX5428

## 2014-05-19 LAB — TYPE AND SCREEN
ABO/RH(D): O POS
Antibody Screen: NEGATIVE
UNIT DIVISION: 0
UNIT DIVISION: 0

## 2014-05-19 LAB — CBC
HCT: 25.1 % — ABNORMAL LOW (ref 36.0–46.0)
HEMOGLOBIN: 8.1 g/dL — AB (ref 12.0–15.0)
MCH: 24.5 pg — AB (ref 26.0–34.0)
MCHC: 32.3 g/dL (ref 30.0–36.0)
MCV: 75.8 fL — ABNORMAL LOW (ref 78.0–100.0)
Platelets: 174 10*3/uL (ref 150–400)
RBC: 3.31 MIL/uL — AB (ref 3.87–5.11)
RDW: 17.9 % — ABNORMAL HIGH (ref 11.5–15.5)
WBC: 4.9 10*3/uL (ref 4.0–10.5)

## 2014-05-19 SURGERY — COLONOSCOPY
Anesthesia: Moderate Sedation

## 2014-05-19 MED ORDER — DIPHENHYDRAMINE HCL 12.5 MG/5ML PO ELIX
12.5000 mg | ORAL_SOLUTION | Freq: Once | ORAL | Status: AC
  Administered 2014-05-19: 12.5 mg via ORAL
  Filled 2014-05-19: qty 5

## 2014-05-19 MED ORDER — MIDAZOLAM HCL 10 MG/2ML IJ SOLN
INTRAMUSCULAR | Status: DC | PRN
  Administered 2014-05-19 (×4): 2 mg via INTRAVENOUS

## 2014-05-19 MED ORDER — SODIUM CHLORIDE 0.9 % IV SOLN
INTRAVENOUS | Status: DC
Start: 2014-05-19 — End: 2014-05-19
  Administered 2014-05-19: 500 mL via INTRAVENOUS

## 2014-05-19 MED ORDER — DIPHENHYDRAMINE HCL 50 MG/ML IJ SOLN
INTRAMUSCULAR | Status: AC
Start: 2014-05-19 — End: 2014-05-19
  Filled 2014-05-19: qty 1

## 2014-05-19 MED ORDER — TECHNETIUM TC 99M-LABELED RED BLOOD CELLS IV KIT
25.0000 | PACK | Freq: Once | INTRAVENOUS | Status: AC | PRN
  Administered 2014-05-19: 25 via INTRAVENOUS

## 2014-05-19 MED ORDER — BUTAMBEN-TETRACAINE-BENZOCAINE 2-2-14 % EX AERO
INHALATION_SPRAY | CUTANEOUS | Status: DC | PRN
Start: 2014-05-19 — End: 2014-05-19
  Administered 2014-05-19: 2 via TOPICAL

## 2014-05-19 MED ORDER — FENTANYL CITRATE 0.05 MG/ML IJ SOLN
INTRAMUSCULAR | Status: DC | PRN
  Administered 2014-05-19 (×4): 25 ug via INTRAVENOUS

## 2014-05-19 MED ORDER — MIDAZOLAM HCL 5 MG/ML IJ SOLN
INTRAMUSCULAR | Status: AC
  Filled 2014-05-19: qty 2

## 2014-05-19 MED ORDER — FENTANYL CITRATE 0.05 MG/ML IJ SOLN
INTRAMUSCULAR | Status: AC
  Filled 2014-05-19: qty 4

## 2014-05-19 NOTE — Progress Notes (Signed)
[facility] Progress Note     Subjective: Pt doing okay, having pain from prep.  Had 4 bloody BM's with prep, BM's now clear.  Overall feeling better this am.  No N/V.  Ready to "find out a diagnosis".  I reminder her this may not give [place] a definitive diagnosis and she understands.    Objective: Vital signs in last 24 hours: Temp:  [97.4 F (36.3 C)-98.5 F (36.9 C)] 97.7 F (36.5 C) (07/06 2120) Pulse Rate:  [77-97] 79 (07/06 2120) Resp:  [17-18] 17 (07/06 2120) BP: (119-140)/(73-90) 127/87 mmHg (07/06 2120) SpO2:  [99 %-100 %] 100 % (07/06 2120) Last BM Date: 05/18/14  Intake/Output from previous day: 07/06 0701 - 07/07 0700 In: 20 [I.V.:20] Out: 3 [Stool:3] Intake/Output this shift:    PE: Gen:  Alert, NAD, pleasant Abd: Soft, tender in LUQ, no other tenderness, ND, +BS, no HSM   Lab Results:   Recent Labs  05/18/14 1855 05/19/14 0720  WBC 6.7 4.9  HGB 8.1* 8.1*  HCT 24.7* 25.1*  PLT 185 174   BMET  Recent Labs  05/17/14 1427 05/18/14 0500  NA 140 138  K 4.0 4.0  CL 103 101  CO2 25 26  GLUCOSE 123* 86  BUN 24* 19  CREATININE 0.71 0.79  CALCIUM 8.7 8.9   PT/INR No results found for this basename: LABPROT, INR,  in the last 72 hours CMP     Component Value Date/Time   NA 138 05/18/2014 0500   K 4.0 05/18/2014 0500   CL 101 05/18/2014 0500   CO2 26 05/18/2014 0500   GLUCOSE 86 05/18/2014 0500   BUN 19 05/18/2014 0500   CREATININE 0.79 05/18/2014 0500   CALCIUM 8.9 05/18/2014 0500   PROT 6.6 05/17/2014 1427   ALBUMIN 3.4* 05/17/2014 1427   AST 17 05/17/2014 1427   ALT 11 05/17/2014 1427   ALKPHOS 65 05/17/2014 1427   BILITOT 0.4 05/17/2014 1427   GFRNONAA >90 05/18/2014 0500   GFRAA >90 05/18/2014 0500   Lipase     Component Value Date/Time   LIPASE 19 05/17/2014 1427       Studies/Results: Ct Abdomen Pelvis W Contrast  05/17/2014   CLINICAL DATA:  LUQ pain, Hx of gastric bypass  EXAM: CT ABDOMEN AND PELVIS WITH CONTRAST  TECHNIQUE: Multidetector CT  imaging of the abdomen and pelvis was performed using the standard protocol following bolus administration of intravenous contrast.  CONTRAST:  OMNIPAQUE IOHEXOL 300 MG/ML  SOLN  COMPARISON:  CT and pelvis 12/08/2012  FINDINGS: The lung bases are negative.  The liver, spleen, adrenals, pancreas, kidneys are unremarkable. Stable small benign cyst lower pole right kidney. Patient is status post gastric bypass.  A small bowel intussusception within the left upper quadrant image 38 series 2.  There is no evidence of bowel obstruction. Air field and contrast filled loops of small bowel are appreciated.  The bowel is otherwise negative. The appendix is negative. Small echogenic gallstones within the dependent portion the gallbladder.  Otherwise no evidence of abdominal or pelvic masses, free fluid, loculated fluid collections, nor adenopathy.  There is no abdominal aortic aneurysm. The celiac, SMA, IMA, portal vein, SMV are opacified.  A small fat containing periumbilical hernia is appreciated. No evidence of an inguinal hernia. There are no aggressive appearing osseous lesions.  IMPRESSION: Small bowel intussusception left upper quadrant. No evidence associated bowel obstruction. This can be an incidental transient finding, a small mass lead point cannot be  excluded. The patient has complaints left upper quadrant pain and further evaluation with GI consultation recommended.  No further evidence of obstructive or inflammatory abnormalities.  The gallstones within the gallbladder and benign cyst right kidney.   Electronically Signed   By: Salome HolmesHector  Cooper M.D.   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 Michaell CowingGross)  Sickle cell  trait - 1 yr ago Hgb 8.9  H/o Iron deficiency anemia   Plan:  1. NPO, bowel rest, IVF pain control, antiemetics  2. GI pending EGD and Colonoscopy today at 11:00 am to see if we can find a source of bleeding, await results 3. Received 2 units pRBC's, Hgb stable today 4. Ambulate and IS   5. SCD's and hold pharm dvt proph due to bleeding ?timing to resume this - after colonoscopy or at all?     LOS: 2 days    Aris GeorgiaDORT, Jara Feider 05/19/2014, 7:55 AM Pager: [phone]

## 2014-05-19 NOTE — Op Note (Signed)
[facility] [address] [place], [address]   COLONOSCOPY PROCEDURE REPORT  PATIENT: Julia, Wheeler  MR#: [id number] BIRTHDATE: Feb 18, 1966 , 47  yrs. old GENDER: Female ENDOSCOPIST: Rachael Fee, MD PROCEDURE DATE:  05/19/2014 PROCEDURE:   Colonoscopy, diagnostic First Screening Colonoscopy - Avg.  risk and is 50 yrs.  old or older - No.  Prior Negative Screening - Now for repeat screening. N/A  History of Adenoma - Now for follow-up colonoscopy & has been > or = to 3 yrs.  N/A  Polyps Removed Today? No.  Recommend repeat exam, <10 yrs? No. ASA CLASS:   Class II INDICATIONS:abdominal pain, overt GI bleeding (marroon stools), intussusception on CT (small bowel). MEDICATIONS: Fentanyl 75 mcg IV and Versed 8 mg IV  DESCRIPTION OF PROCEDURE:   After the risks benefits and alternatives of the procedure were thoroughly explained, informed consent was obtained.  A digital rectal exam revealed no abnormalities of the rectum.   The Pentax Ped Colon [id number] endoscope was introduced through the anus and advanced to the cecum, which was identified by both the appendix and ileocecal valve. No adverse events experienced.   The quality of the prep was good.  The instrument was then slowly withdrawn as the colon was fully examined.    COLON FINDINGS: A normal appearing cecum, ileocecal valve, and appendiceal orifice were identified.  The ascending, hepatic flexure, transverse, splenic flexure, descending, sigmoid colon and rectum appeared unremarkable.  No polyps or cancers were seen. Retroflexed views revealed no abnormalities. The time to cecum=3 minutes 00 seconds.  Withdrawal time=10 minutes 00 seconds.  The scope was withdrawn and the procedure completed. COMPLICATIONS: There were no complications.  ENDOSCOPIC IMPRESSION: Normal colon No old or recent blood.  RECOMMENDATIONS: Will proceed with EGD now.   eSigned:  Rachael Fee, MD 05/19/2014  12:24 PM

## 2014-05-19 NOTE — Progress Notes (Signed)
Pt A&O x4; pt returns back from Endo. Denies any pain. Vitals taken; call light within reach and will continue to monitor quietly. Dionne Bucy RN

## 2014-05-19 NOTE — Op Note (Signed)
[facility] [address] [place], [address]   ENDOSCOPY PROCEDURE REPORT  PATIENT: Wheeler, Julia  MR#: [id number] BIRTHDATE: 01-01-1966 , 47  yrs. old GENDER: Female ENDOSCOPIST: Rachael Fee, MD REFERRED BY: PROCEDURE DATE:  05/19/2014 PROCEDURE:  EGD, diagnostic ASA CLASS:     Class II INDICATIONS:  abdominal pain, gi bleeding; CT scan showed small bowel intussusception; Roux g bypass many years ago, internal hernia repair 3 years ago. MEDICATIONS: Fentanyl 25 mcg IV TOPICAL ANESTHETIC: Cetacaine Spray  DESCRIPTION OF PROCEDURE: After the risks benefits and alternatives of the procedure were thoroughly explained, informed consent was obtained.  The Pentax Gastroscope [id number] endoscope was introduced through the mouth and advanced to the proximal jejunum. Without limitations.  The instrument was slowly withdrawn as the mucosa was fully examined.    Remnant stomach pouch was normal apeparing.  There was gastro-jejunostomy anastomosis that appeared to have two limbs. One was much longer than the other, appeared normal.  The other was short, possibly blind ending.  There was no blood in visualized UGI tract.  Retroflexed views revealed no abnormalities.     The scope was then withdrawn from the patient and the procedure completed.  COMPLICATIONS: There were no complications. ENDOSCOPIC IMPRESSION: Remnant stomach pouch was normal apeparing.  There was gastro-jejunostomy anastomosis that appeared to have two limbs. One was much longer than the other, appeared normal.  The other was short, possibly blind ending.  There was no blood in visualized UGI tract.  RECOMMENDATIONS: I will order nuclear medicine bleeding scan today.  Still unclear location of her overt bleeding, may be from the site of intussusception.    eSigned:  Rachael Fee, MD 05/19/2014 12:37 PM

## 2014-05-19 NOTE — Progress Notes (Signed)
Pt transported off unit to endoscopy for procedure. P. Amo Ladarrious Kirksey Rn.

## 2014-05-19 NOTE — Interval H&P Note (Signed)
History and Physical Interval Note:  05/19/2014 11:47 AM  Julia Wheeler  has presented today for surgery, with the diagnosis of anemia, abdominal pain, gi bleed  The various methods of treatment have been discussed with the patient and family. After consideration of risks, benefits and other options for treatment, the patient has consented to  Procedure(s): COLONOSCOPY (N/A) ESOPHAGOGASTRODUODENOSCOPY (EGD) (N/A) as a surgical intervention .  The patient's history has been reviewed, patient examined, no change in status, stable for surgery.  I have reviewed the patient's chart and labs.  Questions were answered to the patient's satisfaction.     Rachael Fee

## 2014-05-19 NOTE — H&P (View-Only) (Signed)
[facility] Consult: 11:53 AM 05/18/2014  LOS: 1 day    Referring Provider: Dr Lindie Spruce.   Primary Care Physician:  None for many years Primary Gastroenterologist:  unassigned     Reason for Consultation:  Rectal bleeding and anemia   HPI: Julia Wheeler is a 48 y.o. female.  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  S/p 04/2007 laparoscopic repair of 2 internal hernias.  Has sickle cell trait and long hx of anemia.  In 2008 Hgb was 6.7 and Ferritin 1 during surgery admission, transfused 2 units blood and infused with parenteral iron.  Seen by Dr Myna Hidalgo, but never followed up with him as was the MDs intent. Hx E coli UTI in 2008 and 11/2012.   3 weeks of left abdominal pain. Intensely worse after eating and drinking so po intake down and lost 12 # over last 10 days.  astarting at 3 Am yesterday, again at 11 AM and in afternoon: passed bloody and tarry stool.  Intense pain on left.  Dry heaves but no emesis. Admitted from ED.   CT scan:   Small bowel intussusception left upper quadrant. No evidence  associated bowel obstruction. This can be an incidental transient  finding, a small mass lead point cannot be excluded.  Incidental gallstones noted.   Hgb of 7.0 yesterday, to 6.3 today.  MCV of 70. In Jan 2014 Hgb was 8.9 and MCV was 64.  Getting transfused, 2 units ordered.  Gastric occult blood is negative.  Lots of bacteria, + nittites and some WBCs on urine. No abx yet.        Past Medical History  Diagnosis Date  . Sickle cell trait   . Hypertension   . Sickle cell anemia   . Iron deficiency anemia     Past Surgical History  Procedure Laterality Date  . Gastric bypass      In [place], [place]  . Hernia repair  2008    [name] (internal hernia)     Prior to Admission medications   Not on File    Scheduled Meds: . pantoprazole (PROTONIX) IV  40 mg Intravenous QHS   Infusions: . dextrose 5 % and 0.45 % NaCl with KCl 20 mEq/L 100 mL/hr at 05/17/14 2253   PRN Meds: fentaNYL, ondansetron   Allergies as of 05/17/2014 - Review Complete 05/17/2014  Allergen Reaction Noted  . Morphine and related  12/08/2012    No family history on file.  History   Social History  . Marital Status: Single    Spouse Name: N/A    Number of Children: N/A  . Years of Education: N/A   Occupational History  . Not on file.   Social History Main Topics  . Smoking status: Never Smoker   . Smokeless tobacco: Not on file  . Alcohol Use: Yes  . Drug Use: Not on file  . Sexual Activity: Not on file   Other Topics Concern  . Not on file   Social History Narrative  . No narrative  on file    REVIEW OF SYSTEMS: Constitutional:  Per HPI ENT:  No nose bleeds Pulm:  No dyspnea or cough CV:  No palpitations, no LE edema.  GU:  No hematuria, no frequency GI:  Per HPI Heme:  Per HPI   Transfusions:  Per HPI Neuro:  No headaches, no peripheral tingling or numbness.  Supposed to wear distance glasses, does not have and also has pressure on left eye Derm:  No itching, no rash or sores.  Endocrine:  No sweats or chills.  No polyuria or dysuria Immunization:  None for many years    PHYSICAL EXAM: Vital signs in last 24 hours: Filed Vitals:   05/18/14 1120  BP: 125/82  Pulse: 81  Temp: 98.2 F (36.8 C)  Resp: 18   Wt Readings from Last 3 Encounters:  05/17/14 79.561 kg (175 lb 6.4 oz)    General: looks somewhat chronically unwell Head:  No sweling or asymmetryu  Eyes:  No icterus or pallor Ears:  Not HOH  Nose:  No discharge Mouth:  Clear, moist  Neck:  No mass, no TMG, no JVD Lungs:  Clear bil Heart: RRR.  No MRG Abdomen:  Soft, tender without guarding on left and in suprpubis  No mass, no HSM Rectal: deferred,just passed  maroonish stool.  Musc/Skeltl: no joint swelling or deformity Extremities:  No edema  Neurologic:  orinted x 3.  No limb weakness or tremor Skin:  No telangectasia or rash Tattoos:  none Nodes:  No cervical adenopathy   Psych:  Pleasant, relaxed.   Intake/Output from previous day: 07/05 0701 - 07/06 0700 In: 712.7 [I.V.:711.7] Out: 300 [Urine:300] Intake/Output this shift: Total I/O In: 20 [I.V.:20] Out: -   LAB RESULTS:  Recent Labs  05/17/14 1427 05/18/14 0500  WBC 6.2 5.8  HGB 7.0* 6.3*  HCT 22.7* 19.5*  PLT 217 192   BMET Lab Results  Component Value Date   NA 138 05/18/2014   NA 140 05/17/2014   NA 135 12/08/2012   K 4.0 05/18/2014   K 4.0 05/17/2014   K 4.3 12/08/2012   CL 101 05/18/2014   CL 103 05/17/2014   CL 97 12/08/2012   CO2 26 05/18/2014   CO2 25 05/17/2014   CO2 25 12/08/2012   GLUCOSE 86 05/18/2014   GLUCOSE 123* 05/17/2014   GLUCOSE 90 12/08/2012   BUN 19 05/18/2014   BUN 24* 05/17/2014   BUN 12 12/08/2012   CREATININE 0.79 05/18/2014   CREATININE 0.71 05/17/2014   CREATININE 0.79 12/08/2012   CALCIUM 8.9 05/18/2014   CALCIUM 8.7 05/17/2014   CALCIUM 9.1 12/08/2012   LFT  Recent Labs  05/17/14 1427  PROT 6.6  ALBUMIN 3.4*  AST 17  ALT 11  ALKPHOS 65  BILITOT 0.4   PT/INR No results found for this basename: INR, PROTIME   Lipase     Component Value Date/Time   LIPASE 19 05/17/2014 1427    RADIOLOGY STUDIES: Ct Abdomen Pelvis W Contrast  05/17/2014   CLINICAL DATA:  LUQ pain, Hx of gastric bypass  EXAM: CT ABDOMEN AND PELVIS WITH CONTRAST  TECHNIQUE: Multidetector CT imaging of the abdomen and pelvis was performed using the standard protocol following bolus administration of intravenous contrast.  CONTRAST:  100mL OMNIPAQUE IOHEXOL 300 MG/ML  SOLN  COMPARISON:  CT and pelvis 12/08/2012  FINDINGS: The lung bases are negative.  The liver, spleen, adrenals, pancreas, kidneys are unremarkable. Stable small benign cyst lower pole right kidney. Patient  is status post  gastric bypass.  A small bowel intussusception within the left upper quadrant image 38 series 2.  There is no evidence of bowel obstruction. Air field and contrast filled loops of small bowel are appreciated.  The bowel is otherwise negative. The appendix is negative. Small echogenic gallstones within the dependent portion the gallbladder.  Otherwise no evidence of abdominal or pelvic masses, free fluid, loculated fluid collections, nor adenopathy.  There is no abdominal aortic aneurysm. The celiac, SMA, IMA, portal vein, SMV are opacified.  A small fat containing periumbilical hernia is appreciated. No evidence of an inguinal hernia. There are no aggressive appearing osseous lesions.  IMPRESSION: Small bowel intussusception left upper quadrant. No evidence associated bowel obstruction. This can be an incidental transient finding, a small mass lead point cannot be excluded. The patient has complaints left upper quadrant pain and further evaluation with GI consultation recommended.  No further evidence of obstructive or inflammatory abnormalities.  The gallstones within the gallbladder and benign cyst right kidney.   Electronically Signed   By: Salome Holmes M.D.   On: 05/17/2014 18:04    ENDOSCOPIC STUDIES: None ever  IMPRESSION:   *  GI bleed.  Rule out upper vs lower GI bleed  *  SB intususception  *  UTI?   *  2005 Gastric bypass  *  2008 lap repair of internal hernias.    PLAN:     *  egd and colonoscopy tomorrow.  Hopefully will be able to prep.  *  Needs urine culture.    Jennye Moccasin  05/18/2014, 11:53 AM Pager: [phone]    ________________________________________________________________________  [facility] MD note:  I personally examined the patient, reviewed the data and agree with the assessment and plan described above.  Given her altered anatomy, I think it is unlikely that I will be able to advance upper scope to the level of the intussusception but will certainly try.   That site may be the location of her bleeding, EGD and colonoscopy will be useful to check for other possible etiologies.  Will plan on egd/colonoscopy tomorrow.     Rob Bunting, MD [facility] Pager [phone]

## 2014-05-19 NOTE — Progress Notes (Signed)
Patient still with some blood per rectum, although Hgb stable from last night EGD/ colonoscopy showed no sign of bleeding, so bleeding site is probably from SB.  Bleeding scan ordered.    If she continues to have pain/ bleeding, will considered diagnostic laparoscopy/ possible SB resection if able to localize on bleeding scan.  Wilmon Arms. Corliss Skains, MD, [facility]  General/ Trauma Surgery  05/19/2014 1:53 PM

## 2014-05-20 ENCOUNTER — Inpatient Hospital Stay (HOSPITAL_COMMUNITY): Payer: Medicaid Other

## 2014-05-20 ENCOUNTER — Encounter (HOSPITAL_COMMUNITY): Payer: Self-pay | Admitting: Gastroenterology

## 2014-05-20 DIAGNOSIS — K921 Melena: Secondary | ICD-10-CM

## 2014-05-20 DIAGNOSIS — K561 Intussusception: Secondary | ICD-10-CM

## 2014-05-20 DIAGNOSIS — K625 Hemorrhage of anus and rectum: Secondary | ICD-10-CM

## 2014-05-20 DIAGNOSIS — D62 Acute posthemorrhagic anemia: Secondary | ICD-10-CM

## 2014-05-20 DIAGNOSIS — R1012 Left upper quadrant pain: Secondary | ICD-10-CM

## 2014-05-20 LAB — CBC
HEMATOCRIT: 25.2 % — AB (ref 36.0–46.0)
HEMOGLOBIN: 8 g/dL — AB (ref 12.0–15.0)
MCH: 24.2 pg — ABNORMAL LOW (ref 26.0–34.0)
MCHC: 31.7 g/dL (ref 30.0–36.0)
MCV: 76.4 fL — ABNORMAL LOW (ref 78.0–100.0)
Platelets: 196 10*3/uL (ref 150–400)
RBC: 3.3 MIL/uL — ABNORMAL LOW (ref 3.87–5.11)
RDW: 18.4 % — ABNORMAL HIGH (ref 11.5–15.5)
WBC: 5.6 10*3/uL (ref 4.0–10.5)

## 2014-05-20 LAB — SURGICAL PCR SCREEN
MRSA, PCR: NEGATIVE
STAPHYLOCOCCUS AUREUS: NEGATIVE

## 2014-05-20 MED ORDER — MENTHOL 3 MG MT LOZG: 1.0000 | LOZENGE | OROMUCOSAL | Status: DC | PRN

## 2014-05-20 MED ORDER — DIPHENHYDRAMINE HCL 50 MG/ML IJ SOLN: 12.5000 mg | Freq: Four times a day (QID) | INTRAMUSCULAR | Status: DC | PRN

## 2014-05-20 MED ORDER — LORAZEPAM 2 MG/ML IJ SOLN
0.5000 mg | Freq: Three times a day (TID) | INTRAMUSCULAR | Status: DC | PRN
  Administered 2014-05-21 – 2014-05-22 (×2): 0.5 mg via INTRAVENOUS
  Filled 2014-05-20 (×2): qty 1

## 2014-05-20 MED ORDER — BISACODYL 10 MG RE SUPP: 10.0000 mg | Freq: Two times a day (BID) | RECTAL | Status: DC | PRN

## 2014-05-20 MED ORDER — CEFAZOLIN SODIUM-DEXTROSE 2-3 GM-% IV SOLR
2.0000 g | INTRAVENOUS | Status: AC
  Administered 2014-05-21: 2 g via INTRAVENOUS
  Filled 2014-05-20: qty 50

## 2014-05-20 MED ORDER — ACETAMINOPHEN 650 MG RE SUPP: 650.0000 mg | Freq: Four times a day (QID) | RECTAL | Status: DC | PRN

## 2014-05-20 MED ORDER — LACTATED RINGERS IV BOLUS (SEPSIS): 1000.0000 mL | Freq: Three times a day (TID) | INTRAVENOUS | Status: AC | PRN

## 2014-05-20 MED ORDER — ALUM & MAG HYDROXIDE-SIMETH 200-200-20 MG/5ML PO SUSP
30.0000 mL | Freq: Four times a day (QID) | ORAL | Status: DC | PRN
  Administered 2014-05-20: 30 mL via ORAL
  Filled 2014-05-20: qty 30

## 2014-05-20 MED ORDER — METRONIDAZOLE IN NACL 5-0.79 MG/ML-% IV SOLN
500.0000 mg | INTRAVENOUS | Status: AC
  Filled 2014-05-20: qty 100

## 2014-05-20 MED ORDER — PHENOL 1.4 % MT LIQD: 2.0000 | OROMUCOSAL | Status: DC | PRN

## 2014-05-20 MED ORDER — MAGIC MOUTHWASH
15.0000 mL | Freq: Four times a day (QID) | ORAL | Status: DC | PRN
  Administered 2014-05-22: 15 mL via ORAL
  Filled 2014-05-20 (×2): qty 15

## 2014-05-20 MED ORDER — CHLORHEXIDINE GLUCONATE 4 % EX LIQD
1.0000 "application " | Freq: Once | CUTANEOUS | Status: AC
  Administered 2014-05-21: 1 via TOPICAL
  Filled 2014-05-20 (×2): qty 15

## 2014-05-20 MED ORDER — LACTATED RINGERS IV BOLUS (SEPSIS)
1000.0000 mL | Freq: Once | INTRAVENOUS | Status: AC
  Administered 2014-05-21: 1000 mL via INTRAVENOUS

## 2014-05-20 MED ORDER — CHLORHEXIDINE GLUCONATE 4 % EX LIQD
1.0000 "application " | Freq: Once | CUTANEOUS | Status: AC
  Administered 2014-05-20: 1 via TOPICAL
  Filled 2014-05-20 (×2): qty 15

## 2014-05-20 MED ORDER — METOPROLOL TARTRATE 1 MG/ML IV SOLN
5.0000 mg | Freq: Four times a day (QID) | INTRAVENOUS | Status: DC | PRN
  Filled 2014-05-20: qty 5

## 2014-05-20 MED ORDER — LIP MEDEX EX OINT
1.0000 "application " | TOPICAL_OINTMENT | Freq: Two times a day (BID) | CUTANEOUS | Status: DC
  Administered 2014-05-21 – 2014-05-22 (×2): 1 via TOPICAL
  Filled 2014-05-20: qty 7

## 2014-05-20 MED ORDER — PROMETHAZINE HCL 25 MG/ML IJ SOLN: 6.2500 mg | Freq: Four times a day (QID) | INTRAMUSCULAR | Status: DC | PRN

## 2014-05-20 NOTE — Progress Notes (Signed)
[facility] Progress Note    Since last GI note: Colonoscopy, EGD yesterday: no site of bleeding noted.  No blood in GI tract.  Nuc Med bleeding scan last night also showed no bleeding site.  She has not had overt bleeding in 24-36 hours.  Still has left sided abd pains, improved overall since her admission.  Objective: Vital signs in last 24 hours: Temp:  [97.3 F (36.3 C)-98.1 F (36.7 C)] 98 F (36.7 C) (07/08 0617) Pulse Rate:  [65-100] 81 (07/08 0617) Resp:  [10-16] 14 (07/08 0617) BP: (130-165)/(77-112) 141/87 mmHg (07/08 0617) SpO2:  [98 %-100 %] 100 % (07/07 2222) Last BM Date: 05/19/14 General: alert and oriented times 3 Heart: regular rate and rythm Abdomen: soft, non-tender, non-distended, normal bowel sounds   Lab Results:  Recent Labs  05/18/14 1855 05/19/14 0720 05/20/14 0030  WBC 6.7 4.9 5.6  HGB 8.1* 8.1* 8.0*  PLT 185 174 196  MCV 74.2* 75.8* 76.4*    Recent Labs  05/17/14 1427 05/18/14 0500  NA 140 138  K 4.0 4.0  CL 103 101  CO2 25 26  GLUCOSE 123* 86  BUN 24* 19  CREATININE 0.71 0.79  CALCIUM 8.7 8.9    Recent Labs  05/17/14 1427  PROT 6.6  ALBUMIN 3.4*  AST 17  ALT 11  ALKPHOS 65  BILITOT 0.4   Studies/Results: Nm Gi Blood Loss  05/19/2014   CLINICAL DATA:  GI bleeding, site not visible on EGD, colonoscopy; perhaps at site of recent intussusception; remote bariatric surgery  EXAM: NUCLEAR MEDICINE GASTROINTESTINAL BLEEDING SCAN  TECHNIQUE: Sequential abdominal images were obtained following intravenous administration of Tc-39m labeled red blood cells.  RADIOPHARMACEUTICALS:  25 mCi Tc-74m in-vitro labeled red cells.  COMPARISON:  CT abdomen pelvis 05/17/2014  FINDINGS: Expected unremarkable radiotracer activity in normal structures.  There is no evidence of abnormal mobile accumulation of radiotracer activity consistent with active GI hemorrhage.  IMPRESSION: No scintigraphic evidence of active GI hemorrhage.    Electronically Signed   By: Salome Holmes M.D.   On: 05/19/2014 19:25     Medications: Scheduled Meds: . pantoprazole (PROTONIX) IV  40 mg Intravenous QHS   Continuous Infusions: . dextrose 5 % and 0.45 % NaCl with KCl 20 mEq/L 100 mL/hr at 05/19/14 2258   PRN Meds:.fentaNYL, HYDROcodone-acetaminophen, ondansetron    Assessment/Plan: 48 y.o. female with roux g bypass anatomy, intussusception on CT who presented with abd pain/gi bleeding.  Presenting symptoms were left sided pain and bleeding.  Hb was 6.3 at low, has bumped appropriately to 8 after 2 units PRBC.  EGD/colonoscopy failed to localize bleeding or pain etiology.  With her abnormal anatomy, however, a signficant portion of her UGI trac is not visible via upper endoscopy.  She had left sided SB intussusception noted on CT, this may be cause of her left sided abd pains and perhaps also the site of bleeding.  Diagnostic lap may be an appropriate next step.   Rachael Fee, MD  05/20/2014, 8:07 AM [facility] Pager [phone]

## 2014-05-20 NOTE — Progress Notes (Signed)
The patient feels better than at admission but still with significant left upper quadrant pain. Tolerating sips only.  Feeling bloated.  Persistent problem the past 3 weeks. Some flatus.  No bowel movements. LUQ TTP but no peritonitis  At this point, I think she would benefit from diagnostic laparoscopy.  Another option could be upper GI to rule out/in intussusception.  I think I would do that only if she became pain-free.  We will transfer patient to [place] for better laparoscopic option since [facility] system overwhelmed right now.  Discussed with patient.  She agrees with surgery:  The anatomy & physiology of the digestive tract was discussed.  The pathophysiology of perforation was discussed.  Differential diagnosis such as perforated ulcer or colon, etc was discussed.   Natural history risks without surgery such as death was discussed.  I recommended abdominal exploration to diagnose & treat the source of the problem.  Laparoscopic & open techniques were discussed.   Risks such as bleeding, infection, abscess, leak, reoperation, bowel resection, possible ostomy, hernia, heart attack, death, and other risks were discussed.   The risks of no intervention will lead to serious problems including death.   I expressed a good likelihood that surgery will address the problem.    Goals of post-operative recovery were discussed as well.  We will work to minimize complications although risks in an emergent setting are high.   Questions were answered.  The patient expressed understanding & wishes to proceed with surgery.        

## 2014-05-20 NOTE — Progress Notes (Signed)
Called [facility] and gave report to [place] on 5W for patient. Patient ready and will be transferred by [facility].

## 2014-05-20 NOTE — Progress Notes (Signed)
[facility] Progress Note  1 Day Post-Op  Subjective: Feels overall better, but pain still present in LUQ.  CSP, EGD, and tRBC scan did not give [place] a diagnosis or definitive source of bleeding.  No N/V.  Tolerating clears well.  No more BM's or blood per rectum since yesterdays last prep BM.  Ambulating well.  Using IS.    Objective: Vital signs in last 24 hours: Temp:  [97.3 F (36.3 C)-98.1 F (36.7 C)] 98 F (36.7 C) (07/08 0617) Pulse Rate:  [65-100] 81 (07/08 0617) Resp:  [10-16] 14 (07/08 0617) BP: (130-165)/(77-112) 141/87 mmHg (07/08 0617) SpO2:  [98 %-100 %] 100 % (07/07 2222) Last BM Date: 05/19/14  Intake/Output from previous day: 07/07 0701 - 07/08 0700 In: 800 [P.O.:600; I.V.:200] Out: -  Intake/Output this shift:    PE: Gen:  Alert, NAD, pleasant Abd: Soft, tender in LUQ, ND, +BS, no HSM, previous laparoscopic abdominal scars noted   Lab Results:   Recent Labs  05/19/14 0720 05/20/14 0030  WBC 4.9 5.6  HGB 8.1* 8.0*  HCT 25.1* 25.2*  PLT 174 196   BMET  Recent Labs  05/17/14 1427 05/18/14 0500  NA 140 138  K 4.0 4.0  CL 103 101  CO2 25 26  GLUCOSE 123* 86  BUN 24* 19  CREATININE 0.71 0.79  CALCIUM 8.7 8.9   PT/INR No results found for this basename: LABPROT, INR,  in the last 72 hours CMP     Component Value Date/Time   NA 138 05/18/2014 0500   K 4.0 05/18/2014 0500   CL 101 05/18/2014 0500   CO2 26 05/18/2014 0500   GLUCOSE 86 05/18/2014 0500   BUN 19 05/18/2014 0500   CREATININE 0.79 05/18/2014 0500   CALCIUM 8.9 05/18/2014 0500   PROT 6.6 05/17/2014 1427   ALBUMIN 3.4* 05/17/2014 1427   AST 17 05/17/2014 1427   ALT 11 05/17/2014 1427   ALKPHOS 65 05/17/2014 1427   BILITOT 0.4 05/17/2014 1427   GFRNONAA >90 05/18/2014 0500   GFRAA >90 05/18/2014 0500   Lipase     Component Value Date/Time   LIPASE 19 05/17/2014 1427       Studies/Results: Nm Gi Blood Loss  05/19/2014   CLINICAL DATA:  GI bleeding, site not visible on EGD,  colonoscopy; perhaps at site of recent intussusception; remote bariatric surgery  EXAM: NUCLEAR MEDICINE GASTROINTESTINAL BLEEDING SCAN  TECHNIQUE: Sequential abdominal images were obtained following intravenous administration of Tc-50m labeled red blood cells.  RADIOPHARMACEUTICALS:  25 mCi Tc-73m in-vitro labeled red cells.  COMPARISON:  CT abdomen pelvis 05/17/2014  FINDINGS: Expected unremarkable radiotracer activity in normal structures.  There is no evidence of abnormal mobile accumulation of radiotracer activity consistent with active GI hemorrhage.  IMPRESSION: No scintigraphic evidence of active GI hemorrhage.   Electronically Signed   By: Salome Holmes M.D.   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 Michaell Cowing)  Sickle cell trait - 1 yr ago Hgb 8.9  H/o Iron deficiency anemia   Plan:  1. Clears only, IVF pain control, antiemetics  2. GI EGD and Colonoscopy did not identify any source of bleeding of what anatomy they could  see, tRBC scan was negative for active bleeding.  Appreciate GI's help! 3. Has received 2 units pRBC's, Hgb stable today  4. Ambulate and IS  5. SCD's and hold pharm dvt proph due to bleeding ?timing to resume this with Dr. Tsuei 6. She continues to have pain although improved.  Repeat KUB ordered for today some what improved, not ready by rads yet.  Discussed the option of a diagnostic laparoscopy, possible open, possible bowel resection.  She understands this and would like to proceed with surgery if recommended by Dr. Tsuei.      LOS: 3 days    DORT, Graciela Plato 05/20/2014, 8:53 AM Pager: [phone]   

## 2014-05-20 NOTE — Progress Notes (Addendum)
Spoke with Julia Wheeler in [place] regarding orders to have anesthesia write for labs, EKG, and CXR.  Instructed to call in am since this is a 1045 case.  Patient also does not want to sign surgical consent until [name] has discussed what exactly and open procedure would entail should he have to convert to an open procedure

## 2014-05-21 ENCOUNTER — Encounter (HOSPITAL_COMMUNITY): Admission: EM | Disposition: A | Discharge status: Home / Self Care | Payer: Self-pay | Source: Home / Self Care

## 2014-05-21 ENCOUNTER — Inpatient Hospital Stay (HOSPITAL_COMMUNITY): Payer: Medicaid Other | Admitting: Anesthesiology

## 2014-05-21 ENCOUNTER — Encounter (HOSPITAL_COMMUNITY): Payer: Medicaid Other | Admitting: Anesthesiology

## 2014-05-21 ENCOUNTER — Encounter (HOSPITAL_COMMUNITY): Payer: Self-pay | Admitting: Anesthesiology

## 2014-05-21 DIAGNOSIS — R109 Unspecified abdominal pain: Secondary | ICD-10-CM

## 2014-05-21 HISTORY — PX: LAPAROSCOPIC SMALL BOWEL RESECTION: SHX5929

## 2014-05-21 LAB — TYPE AND SCREEN
ABO/RH(D): O POS
Antibody Screen: NEGATIVE

## 2014-05-21 LAB — ABO/RH: ABO/RH(D): O POS

## 2014-05-21 SURGERY — EXCISION, SMALL INTESTINE, LAPAROSCOPIC
Anesthesia: General

## 2014-05-21 MED ORDER — ONDANSETRON HCL 4 MG/2ML IJ SOLN
INTRAMUSCULAR | Status: DC | PRN
  Administered 2014-05-21: 4 mg via INTRAVENOUS

## 2014-05-21 MED ORDER — PROPOFOL 10 MG/ML IV BOLUS
INTRAVENOUS | Status: DC | PRN
  Administered 2014-05-21: 180 mg via INTRAVENOUS

## 2014-05-21 MED ORDER — GLYCOPYRROLATE 0.2 MG/ML IJ SOLN
INTRAMUSCULAR | Status: DC | PRN
  Administered 2014-05-21: .8 mg via INTRAVENOUS

## 2014-05-21 MED ORDER — CEFAZOLIN SODIUM-DEXTROSE 2-3 GM-% IV SOLR
INTRAVENOUS | Status: AC
  Filled 2014-05-21: qty 50

## 2014-05-21 MED ORDER — SODIUM CHLORIDE 0.9 % IJ SOLN
3.0000 mL | Freq: Two times a day (BID) | INTRAMUSCULAR | Status: DC
  Administered 2014-05-21: 3 mL via INTRAVENOUS

## 2014-05-21 MED ORDER — DEXAMETHASONE SODIUM PHOSPHATE 10 MG/ML IJ SOLN
INTRAMUSCULAR | Status: AC
  Filled 2014-05-21: qty 1

## 2014-05-21 MED ORDER — MIDAZOLAM HCL 2 MG/2ML IJ SOLN
INTRAMUSCULAR | Status: AC
  Filled 2014-05-21: qty 2

## 2014-05-21 MED ORDER — BUPIVACAINE-EPINEPHRINE 0.25% -1:200000 IJ SOLN
INTRAMUSCULAR | Status: DC | PRN
  Administered 2014-05-21: 9 mL

## 2014-05-21 MED ORDER — SACCHAROMYCES BOULARDII 250 MG PO CAPS
250.0000 mg | ORAL_CAPSULE | Freq: Two times a day (BID) | ORAL | Status: DC
  Administered 2014-05-21 – 2014-05-22 (×3): 250 mg via ORAL
  Filled 2014-05-21 (×4): qty 1

## 2014-05-21 MED ORDER — TAB-A-VITE/IRON PO TABS
1.0000 | ORAL_TABLET | Freq: Every day | ORAL | Status: DC
  Administered 2014-05-21 – 2014-05-22 (×2): 1 via ORAL
  Filled 2014-05-21 (×2): qty 1

## 2014-05-21 MED ORDER — HYDROMORPHONE HCL PF 1 MG/ML IJ SOLN
INTRAMUSCULAR | Status: AC
  Filled 2014-05-21: qty 1

## 2014-05-21 MED ORDER — POLYETHYLENE GLYCOL 3350 17 G PO PACK
17.0000 g | PACK | Freq: Every day | ORAL | Status: DC
  Administered 2014-05-21 – 2014-05-22 (×2): 17 g via ORAL
  Filled 2014-05-21 (×2): qty 1

## 2014-05-21 MED ORDER — HYDROMORPHONE HCL PF 1 MG/ML IJ SOLN: 0.2500 mg | INTRAMUSCULAR | Status: DC | PRN

## 2014-05-21 MED ORDER — SODIUM CHLORIDE 0.9 % IV SOLN: 250.0000 mL | INTRAVENOUS | Status: DC | PRN

## 2014-05-21 MED ORDER — ROCURONIUM BROMIDE 100 MG/10ML IV SOLN
INTRAVENOUS | Status: AC
  Filled 2014-05-21: qty 1

## 2014-05-21 MED ORDER — LACTATED RINGERS IV SOLN
INTRAVENOUS | Status: DC | PRN
  Administered 2014-05-21 (×2): via INTRAVENOUS

## 2014-05-21 MED ORDER — LIDOCAINE HCL (CARDIAC) 20 MG/ML IV SOLN
INTRAVENOUS | Status: DC | PRN
  Administered 2014-05-21: 50 mg via INTRAVENOUS

## 2014-05-21 MED ORDER — STERILE WATER FOR IRRIGATION IR SOLN
Status: DC | PRN
  Administered 2014-05-21: 1500 mL

## 2014-05-21 MED ORDER — PROPOFOL 10 MG/ML IV BOLUS
INTRAVENOUS | Status: AC
  Filled 2014-05-21: qty 20

## 2014-05-21 MED ORDER — LACTATED RINGERS IV SOLN
INTRAVENOUS | Status: DC
  Administered 2014-05-21: 1000 mL via INTRAVENOUS

## 2014-05-21 MED ORDER — LIDOCAINE HCL (CARDIAC) 20 MG/ML IV SOLN
INTRAVENOUS | Status: AC
  Filled 2014-05-21: qty 5

## 2014-05-21 MED ORDER — SUFENTANIL CITRATE 50 MCG/ML IV SOLN
INTRAVENOUS | Status: DC | PRN
  Administered 2014-05-21: 20 ug via INTRAVENOUS
  Administered 2014-05-21 (×3): 10 ug via INTRAVENOUS

## 2014-05-21 MED ORDER — SUFENTANIL CITRATE 50 MCG/ML IV SOLN
INTRAVENOUS | Status: AC
  Filled 2014-05-21: qty 1

## 2014-05-21 MED ORDER — LACTATED RINGERS IV SOLN: INTRAVENOUS | Status: DC

## 2014-05-21 MED ORDER — SODIUM CHLORIDE 0.9 % IJ SOLN
INTRAMUSCULAR | Status: AC
  Filled 2014-05-21: qty 10

## 2014-05-21 MED ORDER — ONDANSETRON HCL 4 MG/2ML IJ SOLN
INTRAMUSCULAR | Status: AC
  Filled 2014-05-21: qty 2

## 2014-05-21 MED ORDER — 0.9 % SODIUM CHLORIDE (POUR BTL) OPTIME
TOPICAL | Status: DC | PRN
  Administered 2014-05-21: 2000 mL

## 2014-05-21 MED ORDER — MIDAZOLAM HCL 5 MG/5ML IJ SOLN
INTRAMUSCULAR | Status: DC | PRN
  Administered 2014-05-21: 2 mg via INTRAVENOUS

## 2014-05-21 MED ORDER — PROMETHAZINE HCL 25 MG/ML IJ SOLN: 6.2500 mg | INTRAMUSCULAR | Status: DC | PRN

## 2014-05-21 MED ORDER — SODIUM CHLORIDE 0.9 % IJ SOLN: 3.0000 mL | INTRAMUSCULAR | Status: DC | PRN

## 2014-05-21 MED ORDER — SUCCINYLCHOLINE CHLORIDE 20 MG/ML IJ SOLN
INTRAMUSCULAR | Status: DC | PRN
  Administered 2014-05-21: 160 mg via INTRAVENOUS

## 2014-05-21 MED ORDER — PROMETHAZINE HCL 25 MG/ML IJ SOLN: 6.2500 mg | Freq: Four times a day (QID) | INTRAMUSCULAR | Status: DC | PRN

## 2014-05-21 MED ORDER — DEXAMETHASONE SODIUM PHOSPHATE 10 MG/ML IJ SOLN
INTRAMUSCULAR | Status: DC | PRN
  Administered 2014-05-21: 10 mg via INTRAVENOUS

## 2014-05-21 MED ORDER — NEOSTIGMINE METHYLSULFATE 10 MG/10ML IV SOLN
INTRAVENOUS | Status: DC | PRN
Start: 2014-05-21 — End: 2014-05-21
  Administered 2014-05-21: 4 mg via INTRAVENOUS

## 2014-05-21 MED ORDER — DIPHENHYDRAMINE HCL 50 MG/ML IJ SOLN: 12.5000 mg | Freq: Four times a day (QID) | INTRAMUSCULAR | Status: DC | PRN

## 2014-05-21 MED ORDER — BUPIVACAINE-EPINEPHRINE 0.25% -1:200000 IJ SOLN
INTRAMUSCULAR | Status: AC
Start: 2014-05-21 — End: 2014-05-21
  Filled 2014-05-21: qty 1

## 2014-05-21 MED ORDER — ROCURONIUM BROMIDE 100 MG/10ML IV SOLN
INTRAVENOUS | Status: DC | PRN
  Administered 2014-05-21: 35 mg via INTRAVENOUS

## 2014-05-21 SURGICAL SUPPLY — 72 items
APPLIER CLIP 5 13 M/L LIGAMAX5 (MISCELLANEOUS)
APPLIER CLIP ROT 10 11.4 M/L (STAPLE)
BLADE EXTENDED COATED 6.5IN (ELECTRODE) IMPLANT
BLADE HEX COATED 2.75 (ELECTRODE) ×4 IMPLANT
BLADE SURG SZ10 CARB STEEL (BLADE) ×2 IMPLANT
CABLE HIGH FREQUENCY MONO STRZ (ELECTRODE) ×2 IMPLANT
CANISTER SUCTION 2500CC (MISCELLANEOUS) ×2 IMPLANT
CATH KIT ON Q 7.5IN SLV (PAIN MANAGEMENT) IMPLANT
CELLS DAT CNTRL 66122 CELL SVR (MISCELLANEOUS) IMPLANT
CLIP APPLIE 5 13 M/L LIGAMAX5 (MISCELLANEOUS) IMPLANT
CLIP APPLIE ROT 10 11.4 M/L (STAPLE) IMPLANT
COUNTER NEEDLE 20 DBL MAG RED (NEEDLE) IMPLANT
COVER MAYO STAND STRL (DRAPES) ×4 IMPLANT
DECANTER SPIKE VIAL GLASS SM (MISCELLANEOUS) ×2 IMPLANT
DRAIN CHANNEL 19F RND (DRAIN) IMPLANT
DRAPE LAPAROSCOPIC ABDOMINAL (DRAPES) ×2 IMPLANT
DRAPE LG THREE QUARTER DISP (DRAPES) ×2 IMPLANT
DRAPE UTILITY XL STRL (DRAPES) ×4 IMPLANT
DRAPE WARM FLUID 44X44 (DRAPE) ×4 IMPLANT
DRSG OPSITE POSTOP 4X10 (GAUZE/BANDAGES/DRESSINGS) IMPLANT
DRSG OPSITE POSTOP 4X6 (GAUZE/BANDAGES/DRESSINGS) IMPLANT
DRSG OPSITE POSTOP 4X8 (GAUZE/BANDAGES/DRESSINGS) IMPLANT
DRSG TEGADERM 2-3/8X2-3/4 SM (GAUZE/BANDAGES/DRESSINGS) ×2 IMPLANT
DRSG TEGADERM 4X4.75 (GAUZE/BANDAGES/DRESSINGS) IMPLANT
ELECT REM PT RETURN 9FT ADLT (ELECTROSURGICAL) ×2
ELECTRODE REM PT RTRN 9FT ADLT (ELECTROSURGICAL) ×1 IMPLANT
ENDOLOOP SUT PDS II  0 18 (SUTURE)
ENDOLOOP SUT PDS II 0 18 (SUTURE) IMPLANT
GAUZE SPONGE 2X2 8PLY STRL LF (GAUZE/BANDAGES/DRESSINGS) ×1 IMPLANT
GAUZE SPONGE 4X4 12PLY STRL (GAUZE/BANDAGES/DRESSINGS) IMPLANT
GLOVE ECLIPSE 8.0 STRL XLNG CF (GLOVE) ×4 IMPLANT
GLOVE INDICATOR 8.0 STRL GRN (GLOVE) ×4 IMPLANT
GOWN STRL REUS W/TWL XL LVL3 (GOWN DISPOSABLE) ×12 IMPLANT
KIT BASIN OR (CUSTOM PROCEDURE TRAY) ×2 IMPLANT
LEGGING LITHOTOMY PAIR STRL (DRAPES) ×2 IMPLANT
LUBRICANT JELLY K Y 4OZ (MISCELLANEOUS) IMPLANT
PENCIL BUTTON HOLSTER BLD 10FT (ELECTRODE) ×4 IMPLANT
RTRCTR WOUND ALEXIS 18CM MED (MISCELLANEOUS)
SCISSORS LAP 5X35 DISP (ENDOMECHANICALS) ×2 IMPLANT
SEALER TISSUE G2 CVD JAW 35 (ENDOMECHANICALS) IMPLANT
SEALER TISSUE G2 CVD JAW 45CM (ENDOMECHANICALS)
SEALER TISSUE G2 STRG ARTC 35C (ENDOMECHANICALS) IMPLANT
SET IRRIG TUBING LAPAROSCOPIC (IRRIGATION / IRRIGATOR) ×2 IMPLANT
SLEEVE XCEL OPT CAN 5 100 (ENDOMECHANICALS) ×4 IMPLANT
SPONGE GAUZE 2X2 STER 10/PKG (GAUZE/BANDAGES/DRESSINGS) ×1
SPONGE LAP 18X18 X RAY DECT (DISPOSABLE) ×4 IMPLANT
STAPLER VISISTAT 35W (STAPLE) ×2 IMPLANT
SUCTION POOLE TIP (SUCTIONS) ×2 IMPLANT
SUT MNCRL AB 4-0 PS2 18 (SUTURE) ×2 IMPLANT
SUT PDS AB 1 CTX 36 (SUTURE) IMPLANT
SUT PDS AB 1 TP1 96 (SUTURE) IMPLANT
SUT PROLENE 0 CT 2 (SUTURE) IMPLANT
SUT SILK 2 0 (SUTURE) ×1
SUT SILK 2 0 SH CR/8 (SUTURE) ×2 IMPLANT
SUT SILK 2-0 18XBRD TIE 12 (SUTURE) ×1 IMPLANT
SUT SILK 3 0 (SUTURE) ×1
SUT SILK 3 0 SH CR/8 (SUTURE) IMPLANT
SUT SILK 3-0 18XBRD TIE 12 (SUTURE) ×1 IMPLANT
SYR BULB IRRIGATION 50ML (SYRINGE) ×2 IMPLANT
SYS LAPSCP GELPORT 120MM (MISCELLANEOUS)
SYSTEM LAPSCP GELPORT 120MM (MISCELLANEOUS) IMPLANT
TAPE UMBILICAL COTTON 1/8X30 (MISCELLANEOUS) IMPLANT
TOWEL OR 17X26 10 PK STRL BLUE (TOWEL DISPOSABLE) ×2 IMPLANT
TOWEL OR NON WOVEN STRL DISP B (DISPOSABLE) ×2 IMPLANT
TRAY FOLEY CATH 14FRSI W/METER (CATHETERS) ×2 IMPLANT
TRAY LAP CHOLE (CUSTOM PROCEDURE TRAY) ×2 IMPLANT
TROCAR BLADELESS OPT 5 100 (ENDOMECHANICALS) ×2 IMPLANT
TROCAR XCEL NON-BLD 11X100MML (ENDOMECHANICALS) IMPLANT
TUBING CONNECTING 10 (TUBING) IMPLANT
TUBING FILTER THERMOFLATOR (ELECTROSURGICAL) ×2 IMPLANT
TUNNELER SHEATH ON-Q 16GX12 DP (PAIN MANAGEMENT) IMPLANT
YANKAUER SUCT BULB TIP 10FT TU (MISCELLANEOUS) ×4 IMPLANT

## 2014-05-21 NOTE — Interval H&P Note (Signed)
History and Physical Interval Note:  05/21/2014 11:29 AM  Julia Wheeler  has presented today for surgery, with the diagnosis of Gastrointestinal hemorrhage with melena  The various methods of treatment have been discussed with the patient and family. After consideration of risks, benefits and other options for treatment, the patient has consented to  Procedure(s): DIAGNOSTIC POSSIBLE LAPAROSCOPIC SMALL BOWEL RESECTION, POSSIBLE OPEN (N/A) as a surgical intervention .  The patient's history has been reviewed, patient examined, no change in status, stable for surgery.  I have reviewed the patient's chart and labs.  Questions were answered to the patient's satisfaction.     Riven Beebe C.

## 2014-05-21 NOTE — Discharge Instructions (Signed)
LAPAROSCOPIC SURGERY: POST OP INSTRUCTIONS ° °1. DIET: Follow a light bland diet the first 24 hours after arrival home, such as soup, liquids, crackers, etc.  Be sure to include lots of fluids daily.  Avoid fast food or heavy meals as your are more likely to get nauseated.  Eat a low fat the next few days after surgery.   °2. Take your usually prescribed home medications unless otherwise directed. °3. PAIN CONTROL: °a. Pain is best controlled by a usual combination of three different methods TOGETHER: °i. Ice/Heat °ii. Over the counter pain medication °iii. Prescription pain medication °b. Most patients will experience some swelling and bruising around the incisions.  Ice packs or heating pads (30-60 minutes up to 6 times a day) will help. Use ice for the first few days to help decrease swelling and bruising, then switch to heat to help relax tight/sore spots and speed recovery.  Some people prefer to use ice alone, heat alone, alternating between ice & heat.  Experiment to what works for you.  Swelling and bruising can take several weeks to resolve.   °c. It is helpful to take an over-the-counter pain medication regularly for the first few weeks.  Choose one of the following that works best for you: °i. Naproxen (Aleve, etc)  Two 220mg tabs twice a day °ii. Ibuprofen (Advil, etc) Three 200mg tabs four times a day (every meal & bedtime) °iii. Acetaminophen (Tylenol, etc) 500-650mg four times a day (every meal & bedtime) °d. A  prescription for pain medication (such as oxycodone, hydrocodone, etc) should be given to you upon discharge.  Take your pain medication as prescribed.  °i. If you are having problems/concerns with the prescription medicine (does not control pain, nausea, vomiting, rash, itching, etc), please call us [phone] to see if we need to switch you to a different pain medicine that will work better for you and/or control your side effect better. °ii. If you need a refill on your pain medication,  please contact your pharmacy.  They will contact our office to request authorization. Prescriptions will not be filled after 5 pm or on week-ends. °4. Avoid getting constipated.  Between the surgery and the pain medications, it is common to experience some constipation.  Increasing fluid intake and taking a fiber supplement (such as Metamucil, Citrucel, FiberCon, MiraLax, etc) 1-2 times a day regularly will usually help prevent this problem from occurring.  A mild laxative (prune juice, Milk of Magnesia, MiraLax, etc) should be taken according to package directions if there are no bowel movements after 48 hours.   °5. Watch out for diarrhea.  If you have many loose bowel movements, simplify your diet to bland foods & liquids for a few days.  Stop any stool softeners and decrease your fiber supplement.  Switching to mild anti-diarrheal medications (Kayopectate, Pepto Bismol) can help.  If this worsens or does not improve, please call us. °6. Wash / shower every day.  You may shower over the dressings as they are waterproof.  Continue to shower over incision(s) after the dressing is off. °7. Remove your waterproof bandages 5 days after surgery.  You may leave the incision open to air.  You may replace a dressing/Band-Aid to cover the incision for comfort if you wish.  °8. ACTIVITIES as tolerated:   °a. You may resume regular (light) daily activities beginning the next day--such as daily self-care, walking, climbing stairs--gradually increasing activities as tolerated.  If you can walk 30 minutes without difficulty, it   is safe to try more intense activity such as jogging, treadmill, bicycling, low-impact aerobics, swimming, etc. b. Save the most intensive and strenuous activity for last such as sit-ups, heavy lifting, contact sports, etc  Refrain from any heavy lifting or straining until you are off narcotics for pain control.   c. DO NOT PUSH THROUGH PAIN.  Let pain be your guide: If it hurts to do something, don't  do it.  Pain is your body warning you to avoid that activity for another week until the pain goes down. d. You may drive when you are no longer taking prescription pain medication, you can comfortably wear a seatbelt, and you can safely maneuver your car and apply brakes. e. Dennis Bast may have sexual intercourse when it is comfortable.  9. FOLLOW UP in our office a. Please call [facility] at [phone] to set up an appointment to see your surgeon in the office for a follow-up appointment approximately 2-3 weeks after your surgery. b. Make sure that you call for this appointment the day you arrive home to insure a convenient appointment time. 10. IF YOU HAVE DISABILITY OR FAMILY LEAVE FORMS, BRING THEM TO THE OFFICE FOR PROCESSING.  DO NOT GIVE THEM TO YOUR DOCTOR.   WHEN TO CALL us [phone]: 1. Poor pain control 2. Reactions / problems with new medications (rash/itching, nausea, etc)  3. Fever over 101.5 F (38.5 C) 4. Inability to urinate 5. Nausea and/or vomiting 6. Worsening swelling or bruising 7. Continued bleeding from incision. 8. Increased pain, redness, or drainage from the incision   The clinic staff is available to answer your questions during regular business hours (8:30am-5pm).  Please dont hesitate to call and ask to speak to one of our nurses for clinical concerns.   If you have a medical emergency, go to the nearest emergency room or call 911.  A surgeon from [facility] is always on call at the [facility], [place], [place], [place], [place]  [address] ? MAIN: [phone] ? TOLL FREE: [phone] ?  FAX [phone] [email or web address]  GETTING TO GOOD BOWEL HEALTH. Irregular bowel habits such as constipation and diarrhea can lead to many problems over time.  Having one soft bowel movement a day is the most important way to prevent further problems.  The anorectal canal is designed to handle  stretching and feces to safely manage our ability to get rid of solid waste (feces, poop, stool) out of our body.  BUT, hard constipated stools can act like ripping concrete bricks and diarrhea can be a burning fire to this very sensitive area of our body, causing inflamed hemorrhoids, anal fissures, increasing risk is perirectal abscesses, abdominal pain/bloating, an making irritable bowel worse.     The goal: ONE SOFT BOWEL MOVEMENT A DAY!  To have soft, regular bowel movements:    Drink at least 8 tall glasses of water a day.     Take plenty of fiber.  Fiber is the undigested part of plant food that passes into the colon, acting s natures broom to encourage bowel motility and movement.  Fiber can absorb and hold large amounts of water. This results in a larger, bulkier stool, which is soft and easier to pass. Work gradually over several weeks up to 6 servings a day of fiber (25g a day even more if needed) in the form of: o Vegetables -- Root (potatoes, carrots, turnips), leafy green (lettuce, salad greens, celery,  spinach), or cooked high residue (cabbage, broccoli, etc) o Fruit -- Fresh (unpeeled skin & pulp), Dried (prunes, apricots, cherries, etc ),  or stewed ( applesauce)  o Whole grain breads, pasta, etc (whole wheat)  o Bran cereals    Bulking Agents -- This type of water-retaining fiber generally is easily obtained each day by one of the following:  o Psyllium bran -- The psyllium plant is remarkable because its ground seeds can retain so much water. This product is available as Metamucil, Konsyl, Effersyllium, Per Diem Fiber, or the less expensive generic preparation in drug and health food stores. Although labeled a laxative, it really is not a laxative.  o Methylcellulose -- This is another fiber derived from wood which also retains water. It is available as Citrucel. o Polyethylene Glycol - and artificial fiber commonly called Miralax or Glycolax.  It is helpful for people with gassy or  bloated feelings with regular fiber o Flax Seed - a less gassy fiber than psyllium   No reading or other relaxing activity while on the toilet. If bowel movements take longer than 5 minutes, you are too constipated   AVOID CONSTIPATION.  High fiber and water intake usually takes care of this.  Sometimes a laxative is needed to stimulate more frequent bowel movements, but    Laxatives are not a good long-term solution as it can wear the colon out. o Osmotics (Milk of Magnesia, Fleets phosphosoda, Magnesium citrate, MiraLax, GoLytely) are safer than  o Stimulants (Senokot, Castor Oil, Dulcolax, Ex Lax)    o Do not take laxatives for more than 7days in a row.    IF SEVERELY CONSTIPATED, try a Bowel Retraining Program: o Do not use laxatives.  o Eat a diet high in roughage, such as bran cereals and leafy vegetables.  o Drink six (6) ounces of prune or apricot juice each morning.  o Eat two (2) large servings of stewed fruit each day.  o Take one (1) heaping tablespoon of a psyllium-based bulking agent twice a day. Use sugar-free sweetener when possible to avoid excessive calories.  o Eat a normal breakfast.  o Set aside 15 minutes after breakfast to sit on the toilet, but do not strain to have a bowel movement.  o If you do not have a bowel movement by the third day, use an enema and repeat the above steps.    Controlling diarrhea o Switch to liquids and simpler foods for a few days to avoid stressing your intestines further. o Avoid dairy products (especially milk & ice cream) for a short time.  The intestines often can lose the ability to digest lactose when stressed. o Avoid foods that cause gassiness or bloating.  Typical foods include beans and other legumes, cabbage, broccoli, and dairy foods.  Every person has some sensitivity to other foods, so listen to our body and avoid those foods that trigger problems for you. o Adding fiber (Citrucel, Metamucil, psyllium, Miralax) gradually can help  thicken stools by absorbing excess fluid and retrain the intestines to act more normally.  Slowly increase the dose over a few weeks.  Too much fiber too soon can backfire and cause cramping & bloating. o Probiotics (such as active yogurt, Align, etc) may help repopulate the intestines and colon with normal bacteria and calm down a sensitive digestive tract.  Most studies show it to be of mild help, though, and such products can be costly. o Medicines:   Bismuth subsalicylate (ex. Kayopectate, [place]) every  30 minutes for up to 6 doses can help control diarrhea.  Avoid if pregnant.   Loperamide (Immodium) can slow down diarrhea.  Start with two tablets (4mg  total) first and then try one tablet every 6 hours.  Avoid if you are having fevers or severe pain.  If you are not better or start feeling worse, stop all medicines and call your doctor for advice o Call your doctor if you are getting worse or not better.  Sometimes further testing (cultures, endoscopy, X-ray studies, bloodwork, etc) may be needed to help diagnose and treat the cause of the diarrhea. o   Bariatric Surgery You have so much to gain by losing weight. You may have already tried every diet and exercise plan imaginable. And, you may have sought advice from your family physician, too.  Sometimes, in spite of such diligent efforts, you may not be able to achieve long-term results by yourself. In cases of severe obesity, bariatric or weight loss surgery is a proven method of achieving long-term weight control.  Our Services Our bariatric surgery programs offer our patients new hope and a long-term weight-loss solution. Since introducing our services in 2003, we have conducted more than 2,200 successful procedures. Our program is designated as an Special educational needs teacher and is also designated as a Engineer, manufacturing systems by [employer].  Our exceptional weight-loss surgery team  specializes in diagnosis, treatment, follow-up care and ongoing support for our patients with severe weight loss challenges.  We currently offer laparoscopic adjustable gastric band (LAP-BAND), gastric bypass, and sleeve gastrectomy.    Attend our Bariatrics Seminar Choosing to undergo a bariatric procedure is a big decision, and one that should not be taken lightly. You now have two options in how you learn more about weight-loss surgery - in person or online. Our objective is to ensure you have all of the information that you need to evaluate the advantages and obligations of this life-changing procedure. Please note that you are not alone in this process, and our experienced team is ready to assist and answer all of your questions.  There are several ways to register for a seminar (either on-line or in person):   1) Call [phone] 2) Go on-line to [facility] and register for either type of  seminar.    [email or web address]

## 2014-05-21 NOTE — Anesthesia Preprocedure Evaluation (Signed)
Anesthesia Evaluation  Patient identified by MRN, date of birth, ID band Patient awake    Reviewed: Allergy & Precautions, H&P , NPO status , Patient's Chart, lab work & pertinent test results  Airway Mallampati: II TM Distance: >3 FB Neck ROM: Full    Dental no notable dental hx.    Pulmonary neg pulmonary ROS,  breath sounds clear to auscultation  Pulmonary exam normal       Cardiovascular Exercise Tolerance: Good hypertension, Rhythm:Regular Rate:Normal     Neuro/Psych negative neurological ROS  negative psych ROS   GI/Hepatic negative GI ROS, Neg liver ROS,   Endo/Other  negative endocrine ROS  Renal/GU negative Renal ROS  negative genitourinary   Musculoskeletal negative musculoskeletal ROS (+)   Abdominal   Peds negative pediatric ROS (+)  Hematology  (+) anemia , Hgb 8.0 Type and Screen sent   Anesthesia Other Findings   Reproductive/Obstetrics negative OB ROS                           Anesthesia Physical Anesthesia Plan  ASA: II  Anesthesia Plan: General   Post-op Pain Management:    Induction: Intravenous  Airway Management Planned: Oral ETT  Additional Equipment:   Intra-op Plan:   Post-operative Plan: Extubation in OR  Informed Consent: I have reviewed the patients History and Physical, chart, labs and discussed the procedure including the risks, benefits and alternatives for the proposed anesthesia with the patient or authorized representative who has indicated his/her understanding and acceptance.   Dental advisory given  Plan Discussed with: CRNA  Anesthesia Plan Comments:         Anesthesia Quick Evaluation

## 2014-05-21 NOTE — Progress Notes (Signed)
Consult for Bariatric RN received from Dr. Michaell Cowing.  Came to see patient and determine interest in follow-up with Bariatric Surgeon and Bariatric Nutrition.  Patient is interested in follow up with one of the bariatric surgeons at [facility].  Will provide information on the 4 bariatric surgeons to allow patient to determine a preference.  Will also provide patient with recommended vitamin supplementation for post-operative bariatric patients.    Patient will require a [facility] to [facility] on an outpatient basis for follow-up and a bariatric refresher.  I would encourage nutrition labs as the patient has had no bariatric follow up since surgery and is currently not taking any supplements.    Skip Estimable MSN, RN, CNOR Bariatric Nurse Coordinator  [phone]

## 2014-05-21 NOTE — Op Note (Addendum)
05/17/2014 - 05/21/2014  12:40 PM  PATIENT:  Julia Wheeler  48 y.o. female  Patient Care Team: Provider Default, MD as PCP - General  PRE-OPERATIVE DIAGNOSIS:    Gastrointestinal hemorrhage with melena, Possible jejunal intussusception  POST-OPERATIVE DIAGNOSIS:    Abdominal pain of uncertain etiology  PROCEDURE:  Procedure(s): DIAGNOSTIC LAPAROSCOPY   SURGEON:  Surgeon(s): Ardeth SportsmanSteven C. Sheritta Deeg, MD  ASSISTANT: RN   ANESTHESIA:   local and general  EBL:  Total I/O In: 1000 [I.V.:1000] Out: 300 [Urine:300]  Delay start of Pharmacological VTE agent (>24hrs) due to surgical blood loss or risk of bleeding:  no  DRAINS: none   SPECIMEN:  No Specimen  DISPOSITION OF SPECIMEN:  N/A  COUNTS:  YES  PLAN OF CARE: Admit to inpatient   PATIENT DISPOSITION:  PACU - hemodynamically stable.  INDICATION:   Patient Status post laparoscopic gastric bypass in 2005.  [place] years ago.  Had abdominal pain.  Internal hernias found and repaired by me in 2008.  Recommended plugging into the bariatric program for at least annual followup and supplements.  That did not happen.  Does not have primary care physician.  Developed bleeding.  Admitted.  Transfused.  Colonoscopy and upper endoscopy reveals no evidence of bleeding.  No marginal ulcer at her gastrojejunostomy.  Bleeding scan shows no evidence of active bleeding.  CT scan showed evidence of possible jejunal intussusception.  Persistent abdominal pain and left upper quadrant.  Dissection.  I offered diagnostic laparoscopy to rule out persistent intussusception, internal hernias, or other pathology.  She agreed to proceed  The anatomy & physiology of the digestive tract was discussed.  The pathophysiology of abdominal pain & intussussception was discussed.  Differential diagnosis was discussed.   Natural history risks without surgery such as death was discussed.  I recommended abdominal exploration to diagnose & treat the  source of the problem.  Laparoscopic & open techniques were discussed.   Risks such as bleeding, infection, abscess, leak, reoperation, bowel resection, possible ostomy, hernia, heart attack, death, and other risks were discussed.   The risks of no intervention will lead to serious problems including death.   I expressed a good likelihood that surgery will address the problem.    Goals of post-operative recovery were discussed as well.  We will work to minimize complications although risks in an emergent setting are high.   Questions were answered.  The patient expressed understanding & wishes to proceed with surgery.      OR FINDINGS:   Classic anatomy status post gastric bypass.  No evidence of intussusception.  No evidence of enteritis.  No evidence of hematoma or ecchymosis or ischemia or necrosis or perforation.  No internal hernia.  No obstruction.  Normal gastrojejunal and biliopancreatic limbs.  Normal jejunojejunostomy.  Colon normal.  No evidence of volvulus of colon.  No diverticulitis.  No inflammation.  No cholecystitis.  No gastric bloat or irritation.  Essentially normal findings.   DESCRIPTION:   Informed consent was confirmed.  The patient underwent general anaesthesia without difficulty.  The patient was positioned appropriately.  VTE prevention in place.  The patient's abdomen was clipped, prepped, & draped in a sterile fashion.  Surgical timeout confirmed our plan.  The patient was positioned in reverse Trendelenburg.  Abdominal entry with a 5mm port was gained using optical entry technique in the right upper abdomen.  Entry was clean.  I induced carbon dioxide insufflation.  Camera inspection revealed no injury.  Extra ports  were carefully placed under direct laparoscopic visualization.  There were no intra-abdominal adhesions.  I saw a normal sigmoid colon.  I isolated the cecum.  I ran the small bowel from the terminal ileum to the jejunojejunostomy.  No abnormalities.  No  Meckel's diverticulum.  No interloop adhesions.  Jejunojejunostomy seemed appropriately mildly dilated.  No inflammation.  I ran both limbs proximally.  I ran to the ligament of Treitz.  Biliopancreatic limb seemed normal without inflammation or irritation.  I ran from the gastrojejunostomy back to the jejunojejunostomy.  No evidence of intussusception or other abnormalities.  She had a short blind loop at the gastrojejunostomy.  Classic candy cane with the blind limb lateral.  Was about 10 cm long but not dilated.  I left it alone.  The small intestine was rolled away.  I could inspect the colon from the rectum peritoneal reflection up towards the splenic flexure and then along the the transverse colon proximally to the cecum.  No abnormalities found.  I looked for any evidence of internal hernias.  My repair of internal hernias from 2008 was intact and there was no evidence of any new internal hernias.  There is no evidence of inflammation or irritation.  The stomach did not seem bloated.  Colon did not look inflamed or irritated.  There is no evidence of any volvulus or torsion.  I reran the intestine again from both limbs to the jejunojejunostomy and from the ileocecal valve to the jejunojejunostomy.  Again no abnormalities found.  Her mesentery was quite thin.  No evidence of lymphadenopathy.  No evidence of any abnormalities in the retroperitoneum.  Pancreas did not seem inflamed.  Inspection of the rest of organs noted everything else seemed normal.  In the absence of finding any pathology, I felt no reason to continue the surgery.  Carbon dioxide was evacuated.  Ports removed.  Skin closed with Monocryl stitch.  Sterile dressing applied.  Apparently the patient's sister is not here but I was able to call her @ [phone] and discuss intraoperative findings.  I discussed with our bariatric coordinator.  They will try again to plug her into our bariatric program to make sure she gets followup for her  anemia and dietary instructions.  Hopefully we can establish her with her primary care physician as well since she has been not involved in medical care for many years.

## 2014-05-21 NOTE — Transfer of Care (Signed)
Immediate Anesthesia Transfer of Care Note  Patient: Julia Wheeler  Procedure(s) Performed: Procedure(s): DIAGNOSTIC LAPAROSCOPy  (N/A)  Patient Location: PACU  Anesthesia Type:General  Level of Consciousness: awake, alert , oriented and patient cooperative  Airway & Oxygen Therapy: Patient Spontanous Breathing and Patient connected to face mask oxygen  Post-op Assessment: Report given to PACU RN, Post -op Vital signs reviewed and stable and Patient moving all extremities X 4  Post vital signs: stable  Complications: No apparent anesthesia complications

## 2014-05-21 NOTE — H&P (View-Only) (Signed)
The patient feels better than at admission but still with significant left upper quadrant pain. Tolerating sips only.  Feeling bloated.  Persistent problem the past 3 weeks. Some flatus.  No bowel movements. LUQ TTP but no peritonitis  At this point, I think she would benefit from diagnostic laparoscopy.  Another option could be upper GI to rule out/in intussusception.  I think I would do that only if she became pain-free.  We will transfer patient to [facility] for better laparoscopic option since [facility] system overwhelmed right now.  Discussed with patient.  She agrees with surgery:  The anatomy & physiology of the digestive tract was discussed.  The pathophysiology of perforation was discussed.  Differential diagnosis such as perforated ulcer or colon, etc was discussed.   Natural history risks without surgery such as death was discussed.  I recommended abdominal exploration to diagnose & treat the source of the problem.  Laparoscopic & open techniques were discussed.   Risks such as bleeding, infection, abscess, leak, reoperation, bowel resection, possible ostomy, hernia, heart attack, death, and other risks were discussed.   The risks of no intervention will lead to serious problems including death.   I expressed a good likelihood that surgery will address the problem.    Goals of post-operative recovery were discussed as well.  We will work to minimize complications although risks in an emergent setting are high.   Questions were answered.  The patient expressed understanding & wishes to proceed with surgery.

## 2014-05-21 NOTE — Anesthesia Postprocedure Evaluation (Signed)
  Anesthesia Post-op Note  Patient: Julia Wheeler  Procedure(s) Performed: Procedure(s) (LRB): DIAGNOSTIC LAPAROSCOPy  (N/A)  Patient Location: PACU  Anesthesia Type: General  Level of Consciousness: awake and alert   Airway and Oxygen Therapy: Patient Spontanous Breathing  Post-op Pain: mild  Post-op Assessment: Post-op Vital signs reviewed, Patient's Cardiovascular Status Stable, Respiratory Function Stable, Patent Airway and No signs of Nausea or vomiting  Last Vitals:  Filed Vitals:   05/21/14 1429  BP: 131/88  Pulse: 67  Temp: 36.5 C  Resp: 15    Post-op Vital Signs: stable   Complications: No apparent anesthesia complications

## 2014-05-21 NOTE — H&P (View-Only) (Signed)
[facility] Progress Note  1 Day Post-Op  Subjective: Feels overall better, but pain still present in LUQ.  CSP, EGD, and tRBC scan did not give [place] a diagnosis or definitive source of bleeding.  No N/V.  Tolerating clears well.  No more BM's or blood per rectum since yesterdays last prep BM.  Ambulating well.  Using IS.    Objective: Vital signs in last 24 hours: Temp:  [97.3 F (36.3 C)-98.1 F (36.7 C)] 98 F (36.7 C) (07/08 0617) Pulse Rate:  [65-100] 81 (07/08 0617) Resp:  [10-16] 14 (07/08 0617) BP: (130-165)/(77-112) 141/87 mmHg (07/08 0617) SpO2:  [98 %-100 %] 100 % (07/07 2222) Last BM Date: 05/19/14  Intake/Output from previous day: 07/07 0701 - 07/08 0700 In: 800 [P.O.:600; I.V.:200] Out: -  Intake/Output this shift:    PE: Gen:  Alert, NAD, pleasant Abd: Soft, tender in LUQ, ND, +BS, no HSM, previous laparoscopic abdominal scars noted   Lab Results:   Recent Labs  05/19/14 0720 05/20/14 0030  WBC 4.9 5.6  HGB 8.1* 8.0*  HCT 25.1* 25.2*  PLT 174 196   BMET  Recent Labs  05/17/14 1427 05/18/14 0500  NA 140 138  K 4.0 4.0  CL 103 101  CO2 25 26  GLUCOSE 123* 86  BUN 24* 19  CREATININE 0.71 0.79  CALCIUM 8.7 8.9   PT/INR No results found for this basename: LABPROT, INR,  in the last 72 hours CMP     Component Value Date/Time   NA 138 05/18/2014 0500   K 4.0 05/18/2014 0500   CL 101 05/18/2014 0500   CO2 26 05/18/2014 0500   GLUCOSE 86 05/18/2014 0500   BUN 19 05/18/2014 0500   CREATININE 0.79 05/18/2014 0500   CALCIUM 8.9 05/18/2014 0500   PROT 6.6 05/17/2014 1427   ALBUMIN 3.4* 05/17/2014 1427   AST 17 05/17/2014 1427   ALT 11 05/17/2014 1427   ALKPHOS 65 05/17/2014 1427   BILITOT 0.4 05/17/2014 1427   GFRNONAA >90 05/18/2014 0500   GFRAA >90 05/18/2014 0500   Lipase     Component Value Date/Time   LIPASE 19 05/17/2014 1427       Studies/Results: Nm Gi Blood Loss  05/19/2014   CLINICAL DATA:  GI bleeding, site not visible on EGD,  colonoscopy; perhaps at site of recent intussusception; remote bariatric surgery  EXAM: NUCLEAR MEDICINE GASTROINTESTINAL BLEEDING SCAN  TECHNIQUE: Sequential abdominal images were obtained following intravenous administration of Tc-50m labeled red blood cells.  RADIOPHARMACEUTICALS:  25 mCi Tc-73m in-vitro labeled red cells.  COMPARISON:  CT abdomen pelvis 05/17/2014  FINDINGS: Expected unremarkable radiotracer activity in normal structures.  There is no evidence of abnormal mobile accumulation of radiotracer activity consistent with active GI hemorrhage.  IMPRESSION: No scintigraphic evidence of active GI hemorrhage.   Electronically Signed   By: Salome Holmes M.D.   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 Michaell Cowing)  Sickle cell trait - 1 yr ago Hgb 8.9  H/o Iron deficiency anemia   Plan:  1. Clears only, IVF pain control, antiemetics  2. GI EGD and Colonoscopy did not identify any source of bleeding of what anatomy they could  see, tRBC scan was negative for active bleeding.  Appreciate GI's help! 3. Has received 2 units pRBC's, Hgb stable today  4. Ambulate and IS  5. SCD's and hold pharm dvt proph due to bleeding ?timing to resume this with Dr. Corliss Skainssuei 6. She continues to have pain although improved.  Repeat KUB ordered for today some what improved, not ready by rads yet.  Discussed the option of a diagnostic laparoscopy, possible open, possible bowel resection.  She understands this and would like to proceed with surgery if recommended by Dr. Corliss Skainssuei.      LOS: 3 days    Aris GeorgiaDORT, Madie Cahn 05/20/2014, 8:53 AM Pager: [phone]

## 2014-05-22 ENCOUNTER — Telehealth (HOSPITAL_BASED_OUTPATIENT_CLINIC_OR_DEPARTMENT_OTHER): Payer: Self-pay | Admitting: *Deleted

## 2014-05-22 ENCOUNTER — Encounter (HOSPITAL_COMMUNITY): Payer: Self-pay | Admitting: Surgery

## 2014-05-22 ENCOUNTER — Encounter: Payer: Self-pay | Admitting: General Surgery

## 2014-05-22 DIAGNOSIS — D573 Sickle-cell trait: Secondary | ICD-10-CM | POA: Diagnosis present

## 2014-05-22 DIAGNOSIS — D509 Iron deficiency anemia, unspecified: Secondary | ICD-10-CM | POA: Diagnosis present

## 2014-05-22 MED ORDER — OXYCODONE HCL 5 MG PO TABS: 5.0000 mg | ORAL_TABLET | ORAL | Status: DC | PRN

## 2014-05-22 MED ORDER — ACETAMINOPHEN 325 MG PO TABS: 650.0000 mg | ORAL_TABLET | Freq: Four times a day (QID) | ORAL | Status: DC | PRN

## 2014-05-22 MED ORDER — TAB-A-VITE/IRON PO TABS: ORAL_TABLET | ORAL | Status: DC

## 2014-05-22 MED ORDER — POLYETHYLENE GLYCOL 3350 17 G PO PACK: PACK | ORAL | Status: DC

## 2014-05-22 MED ORDER — SACCHAROMYCES BOULARDII 250 MG PO CAPS: 250.0000 mg | ORAL_CAPSULE | Freq: Two times a day (BID) | ORAL | Status: DC

## 2014-05-22 NOTE — Progress Notes (Signed)
Feeling better.  Okay to mobilize and advance diet.  Bariatric training/support appreciated.  D/C patient from hospital when patient meets criteria (anticipate in 0-1 day(s)):  Tolerating oral intake well Ambulating in walkways Adequate pain control without IV medications Urinating  Having flatus

## 2014-05-22 NOTE — Progress Notes (Signed)
1 Day Post-Op  Subjective: She says she feels better. Bariatric nurse and dietitian has seen her.  Plan to mobilize her and if she does well home later today.  Objective: Vital signs in last 24 hours: Temp:  [97.2 F (36.2 C)-98.2 F (36.8 C)] 97.5 F (36.4 C) (07/10 0556) Pulse Rate:  [64-101] 73 (07/10 0556) Resp:  [12-21] 16 (07/10 0556) BP: (127-150)/(69-95) 137/89 mmHg (07/10 0556) SpO2:  [99 %-100 %] 100 % (07/10 0556) Last BM Date: 05/19/14 Bariatric diet Afebrile, VSS No labs  Intake/Output from previous day: 07/09 0701 - 07/10 0700 In: 3570 [P.O.:520; I.V.:3050] Out: 1850 [Urine:1850] Intake/Output this shift: Total I/O In: 240 [P.O.:240] Out: 700 [Urine:700]  General appearance: alert, cooperative and no distress Resp: clear to auscultation bilaterally GI: soft sore, port site dressing are dry.  Lab Results:   Recent Labs  05/20/14 0030  WBC 5.6  HGB 8.0*  HCT 25.2*  PLT 196    BMET No results found for this basename: NA, K, CL, CO2, GLUCOSE, BUN, CREATININE, CALCIUM,  in the last 72 hours PT/INR No results found for this basename: LABPROT, INR,  in the last 72 hours   Recent Labs Lab 05/17/14 1427  AST 17  ALT 11  ALKPHOS 65  BILITOT 0.4  PROT 6.6  ALBUMIN 3.4*     Lipase     Component Value Date/Time   LIPASE 19 05/17/2014 1427     Studies/Results: No results found.  Medications: . lip balm  1 application Topical BID  . multivitamins with iron  1 tablet Oral Daily  . polyethylene glycol  17 g Oral Daily  . saccharomyces boulardii  250 mg Oral BID  . sodium chloride  3 mL Intravenous Q12H    Assessment/Plan 1.  Abdominal pain with possible: Gastrointestinal hemorrhage with melena, Possible jejunal intussusception;  2.  Post op:Abdominal pain of uncertain etiology;  S/p DIAGNOSTIC LAPAROSCOPY , nothing found on  exam.05/21/14 Dr. Michaell Cowing 3.  Diagnostic EGD 05/19/14  Dr. Rob Bunting:  Remnant stomach pouch was normal apeparing. There  was  gastro-jejunostomy anastomosis that appeared to have two limbs.  One was much longer than the other, appeared normal. The other was  short, possibly blind ending. There was no blood in visualized UGI tract. 4.  Colonoscopy;  Normal colon  No old or recent blood, 05/19/14 .  Dr. 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, [place] 2005)  H/o SBO with 2 internal hernias (04/18/2007 - Dr. Michaell Cowing)  Sickle cell trait - 1 yr ago Hgb 8.9  H/o Iron deficiency anemia  Transfused 2 units PRBC 05/18/14.     Plan:  If she does well home later today.   LOS: 5 days    Alphonza Tramell 05/22/2014

## 2014-05-22 NOTE — Plan of Care (Signed)
Problem: Food- and Nutrition-Related Knowledge Deficit (NB-1.1) Goal: Nutrition education Formal process to instruct or train a patient/client in a skill or to impart knowledge to help patients/clients voluntarily manage or modify food choices and eating behavior to maintain or improve health. Outcome: Completed/Met Date Met:  05/22/14 Received consult for bariatric nutrition and multivitamin/mineral review  Recommend nutrition labs as pt has not been compliant with bariatric MVI/mineral supplements. Would also benefit from addition of calcium citrate, vitamin D-3 and increasing MVI w/iron to BID  Pt underwent gastric bypass procedure in 2005. Pt reported some success in losing weight, lowest weight was 167 lbs. Diet recall indicates pt "grazes" throughout the day with low fat, no sugar diet. Did reported overeating, which has resulted in some weight gain to current weight of 180 lbs. Reported to have lost 5 lbs through unhealthy means of dehydrating self and consuming three high protein meals/day.  Encouraged pt to comply with general healthy diet for controlled, manageable weight loss. To avoid over-eating, discussed w/pt consuming protein first, then eating vegetables, fruit and lastly starch to curb appetite. Also recommended pt listen to body cues and to stop eating once feeling full. Verbalized understanding. Reviewed general healthy diet to avoid dumping syndrome. Bariatric RN has also provided pt with list of recommended bariatric nutrition, which pt reported to have read extensively and had no further questions regarding material.  Pt was also unaware of proper multivitamins and mineral regimen.Had been taking all MVI and minerals at same time, and was getting vitamin B12 shot weekly. Provided pt with "Bariatric Surgery Vitamin and Mineral Supplementation" handout from the [employer]. Also provided pt with example schedule of weekly MVI/mineral schedules. Encouraged pt to  consume calcium and MVI w/iron separately to improve absorption of nutrients  Recommend pt follow up with outpatient bariatric RD for further nutrition counseling. Teach back method used, pt expressed understanding, expect good compliance.  Atlee Abide MS RD LDN Clinical Dietitian [phone]

## 2014-05-22 NOTE — Progress Notes (Addendum)
Pt DC instructions reviewed. All questions and concerns addressed. Pt without s/s of distress or discomfort,  VSS, DSGs C/D/I. DC home with family. Offered info on PCP but pt refused.

## 2014-05-22 NOTE — Telephone Encounter (Signed)
Encounter made in error. No call to patient.

## 2014-05-22 NOTE — Discharge Summary (Signed)
Physician Discharge Summary  Patient ID: Julia Wheeler MRN: [id number] DOB/AGE: 1966/03/16 48 y.o.  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 Michaell CowingGross)  Sickle cell trait - 1 yr ago Hgb 8.9    Discharge Diagnoses:  1. Abdominal pain with possible: Gastrointestinal hemorrhage with melena, Possible jejunal intussusception;  2.  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  H/o SBO with 2 internal hernias (04/18/2007 - Dr. Michaell CowingGross)  4.  Sickle cell trait - 1 yr ago Hgb 8.9  5.  H/o Iron deficiency anemia  With severe anemia on admit.  Transfused 2 units PRBC 05/18/14.     Principal Problem:   Abdominal pain, left upper quadrant Active Problems:   Intussusception of intestine - physiologic & self-resolved   GI bleed   Sickle cell trait   Iron deficiency anemia   PROCEDURES:  1.  Diagnostic EGD 05/19/14 Dr. Rob Buntinganiel Jacobs: Remnant stomach pouch was normal  apeparing. There was gastro-jejunostomy anastomosis that appeared to have two limbs. One was  much longer than the other, appeared normal. The other was short, possibly blind ending. There was  no blood in visualized UGI tract.  2.  Colonoscopy; Normal colon No old or recent blood, 05/19/14 . Dr. Francisco Capuchinaniel Jabobs 3.  S/p DIAGNOSTIC LAPAROSCOPY , nothing found on exam.05/21/14 Dr. Chelsea AusGross    Hospital Course:  The patient has had several weeks of nagging left upper quadrant abdominal discomfort. Julia Wheeler is also chronically anemic. Julia Wheeler is status post gastric bypass with normal we anastomosis approximately 3 years ago done in [facility] [place].  The patient has not seen a physician in this area for the last 3 years. Julia Wheeler has  been healthy with the exception of chronic anemia. This nagging left upper quadrant abdominal pain got worse today when Julia Wheeler started having bright red blood per rectum followed by 2 prairie dog maroon colored bowel movements associated with bowel movements. The patient also got very lightheaded. Julia Wheeler had a near syncopal episode. With this Julia Wheeler came into the emergency department for evaluation.  A CT scan of the abdomen and pelvis demonstrated an area in the left upper quadrant of possible intussusception. This is possibly chronic and is nonobstructed.  Julia Wheeler was seen and admitted by Dr. Lindie SpruceWyatt.  Her exam was non tender, on his exam, but had a very low hemoglobin.  Julia Wheeler was hydrated and still had pain and swell, no further bloody stools.  Julia Wheeler was seen by GI, Dr. Christella HartiganJacobs and he performed the above noted EGD, and colonoscopy.  Nothing was found on either exam EGD:  Remnant stomach pouch was normal apeparing. There was  gastro-jejunostomy anastomosis that appeared to have two limbs.  One was much longer than the other, appeared normal. The other was  short, possibly blind ending. There was no blood in visualized UGI  tract. Retroflexed views revealed no abnormalities. The scope  was then withdrawn from the patient and the procedure completed. Julia Wheeler received 2 units of PRBC on 05/18/14.  Julia Wheeler did well with both the EGD and colonsocopy.   Julia Wheeler was seen by Dr. Michaell CowingGross 05/20/14.  No source for bleeding could be found so [facility]    oN 05/21/14 Julia Wheeler underwent exploratopy laparoscopy.  Again nothing was found to corroborate her symptoms/blood loss.  Findings at surgery:  Classic anatomy status post gastric bypass. No evidence of intussusception. No evidence of enteritis. No evidence of hematoma or ecchymosis or ischemia or necrosis or perforation. No internal hernia. No obstruction. Normal gastrojejunal and biliopancreatic limbs. Normal jejunojejunostomy. Colon normal. No evidence of volvulus of colon. No  diverticulitis. No inflammation. No cholecystitis. No gastric bloat or irritation. Julia Wheeler did well with the surgery and her diet was advanced the following AM.  Julia Wheeler wanted to go home after lunch.  Julia Wheeler remains anemic with H/H: 8/25.  Julia Wheeler has been started on MVI with iron for her anemia.  Julia Wheeler has not seen a PCP in sometime.  We instructed ehr to obtain a relationship and follow up with PCP.  Julia Wheeler is doing well and request discharge home today.  Follow up with [name] in 2 weeks.  CBC    Component Value Date/Time   WBC 5.6 05/20/2014 0030   RBC 3.30* 05/20/2014 0030   RBC 3.65* 04/16/2007 0540   HGB 8.0* 05/20/2014 0030   HCT 25.2* 05/20/2014 0030   PLT 196 05/20/2014 0030   MCV 76.4* 05/20/2014 0030   MCH 24.2* 05/20/2014 0030   MCHC 31.7 05/20/2014 0030   RDW 18.4* 05/20/2014 0030   LYMPHSABS 0.5* 12/08/2012 1031   MONOABS 0.4 12/08/2012 1031   EOSABS 0.0 12/08/2012 1031   BASOSABS 0.0 12/08/2012 1031   CMP     Component Value Date/Time   NA 138 05/18/2014 0500   K 4.0 05/18/2014 0500   CL 101 05/18/2014 0500   CO2 26 05/18/2014 0500   GLUCOSE 86 05/18/2014 0500   BUN 19 05/18/2014 0500   CREATININE 0.79 05/18/2014 0500   CALCIUM 8.9 05/18/2014 0500   PROT 6.6 05/17/2014 1427   ALBUMIN 3.4* 05/17/2014 1427   AST 17 05/17/2014 1427   ALT 11 05/17/2014 1427   ALKPHOS 65 05/17/2014 1427   BILITOT 0.4 05/17/2014 1427   GFRNONAA >90 05/18/2014 0500   GFRAA >90 05/18/2014 0500   Condition on d/c:  improved   Disposition: 01-Home or Self Care     Medication List         acetaminophen 325 MG tablet  Commonly known as:  TYLENOL  Take 2 tablets (650 mg total) by mouth every 6 (six) hours as needed (Do not take more than 4000 mg of tylenol (acetaminophen) per day.).     multivitamins with iron Tabs tablet  Multivitamin with iron, take one daily.  Any over the counter brand is fine.     oxyCODONE 5 MG immediate release tablet  Commonly known as:  ROXICODONE  Take 1-2 tablets (5-10 mg total) by mouth every 4 (four) hours as  needed for severe pain.     polyethylene glycol packet  Commonly known as:  MIRALAX / GLYCOLAX  You may take this as per label instructions as needed for acute constipation.  You can buy this over the counter.     saccharomyces boulardii 250 MG capsule  Commonly known as:  FLORASTOR  Take 1 capsule (250 mg total) by mouth 2 (two) times daily.           Follow-up Information   Follow up with [facility] In 2 weeks. (See [place] only as needed, If symptoms worsen)    Contact information:   [address] [place] [address]       Follow up with Ardeth Sportsman., MD.  Schedule an appointment as soon as possible for a visit in 2 weeks.   Specialty:  General Surgery   Contact information:   [address] [place] [address] [phone]       Follow up with Rachael Fee, MD. (As needed.  This is the gastroenterologist who did your studies in the hospital, As needed)    Specialty:  Gastroenterology   Contact information:   [address] [place] [address] [phone]       Please follow up. (You need to obtain a primary care doctor and see them in 2-3 weeks.)       Signed: Dashay Giesler 05/22/2014, 2:52 PM

## 2014-05-22 NOTE — Discharge Summary (Signed)
Laparoscopy without abdominal catastrophe.  Seems to be improving with bowel regimen advancing diet.    Bariatric support given in Hospital with outpatient followup recommendation.  Hopefully she will continue to improve.  Hopefully she will plug-in to regular preventative healthcare for her gastric bypass and other health issues.

## 2014-05-25 ENCOUNTER — Telehealth (INDEPENDENT_AMBULATORY_CARE_PROVIDER_SITE_OTHER): Payer: Self-pay

## 2014-05-25 NOTE — Telephone Encounter (Signed)
LM with family member for pt to call office b/c I need to give pt a f/u appt with [name]. Appt made for 8/5 arrive at 10:45/11:00.

## 2014-06-17 ENCOUNTER — Encounter (INDEPENDENT_AMBULATORY_CARE_PROVIDER_SITE_OTHER): Payer: Medicaid Other | Admitting: Surgery

## 2014-12-26 ENCOUNTER — Encounter (HOSPITAL_COMMUNITY): Payer: Self-pay | Admitting: Emergency Medicine

## 2014-12-26 DIAGNOSIS — R188 Other ascites: Secondary | ICD-10-CM | POA: Diagnosis present

## 2014-12-26 DIAGNOSIS — F329 Major depressive disorder, single episode, unspecified: Secondary | ICD-10-CM | POA: Diagnosis present

## 2014-12-26 DIAGNOSIS — Z885 Allergy status to narcotic agent status: Secondary | ICD-10-CM

## 2014-12-26 DIAGNOSIS — E860 Dehydration: Secondary | ICD-10-CM | POA: Diagnosis present

## 2014-12-26 DIAGNOSIS — A084 Viral intestinal infection, unspecified: Principal | ICD-10-CM | POA: Diagnosis present

## 2014-12-26 DIAGNOSIS — D509 Iron deficiency anemia, unspecified: Secondary | ICD-10-CM | POA: Diagnosis present

## 2014-12-26 DIAGNOSIS — E871 Hypo-osmolality and hyponatremia: Secondary | ICD-10-CM | POA: Diagnosis present

## 2014-12-26 DIAGNOSIS — E876 Hypokalemia: Secondary | ICD-10-CM | POA: Diagnosis present

## 2014-12-26 DIAGNOSIS — I10 Essential (primary) hypertension: Secondary | ICD-10-CM | POA: Diagnosis present

## 2014-12-26 DIAGNOSIS — K5900 Constipation, unspecified: Secondary | ICD-10-CM | POA: Diagnosis present

## 2014-12-26 DIAGNOSIS — D573 Sickle-cell trait: Secondary | ICD-10-CM | POA: Diagnosis present

## 2014-12-26 DIAGNOSIS — Z9884 Bariatric surgery status: Secondary | ICD-10-CM

## 2014-12-26 MED ORDER — ONDANSETRON 4 MG PO TBDP
ORAL_TABLET | ORAL | Status: AC
  Filled 2014-12-26: qty 1

## 2014-12-26 MED ORDER — ONDANSETRON 4 MG PO TBDP
8.0000 mg | ORAL_TABLET | Freq: Once | ORAL | Status: AC
  Administered 2014-12-26: 8 mg via ORAL

## 2014-12-26 NOTE — ED Notes (Signed)
Pt. reports persistent emesis , diarrhea , chills and body aches onset this afternoon after eating lunch .

## 2014-12-26 NOTE — ED Provider Notes (Signed)
MSE was initiated and I personally evaluated the patient and placed orders (if any) at  11:22 PM on December 26, 2014.  The patient appears stable so that the remainder of the MSE may be completed by another provider.  Nausea and vomiting and diarrhea that began today. Abdominal cramping. No fever. Abdomen nontender  Filed Vitals:   12/26/14 2318  BP: 178/107  Pulse: 97  Temp: 97.7 F (36.5 C)  Resp: 20     Lyanne Co, MD 12/26/14 2322

## 2014-12-27 ENCOUNTER — Emergency Department (HOSPITAL_COMMUNITY): Payer: Medicaid Other

## 2014-12-27 ENCOUNTER — Inpatient Hospital Stay (HOSPITAL_COMMUNITY)
Admission: EM | Admit: 2014-12-27 | Discharge: 2014-12-30 | DRG: 392 | Disposition: A | Discharge status: Home / Self Care | Payer: Medicaid Other | Attending: Internal Medicine | Admitting: Internal Medicine

## 2014-12-27 ENCOUNTER — Encounter (HOSPITAL_COMMUNITY): Payer: Self-pay | Admitting: Radiology

## 2014-12-27 DIAGNOSIS — E876 Hypokalemia: Secondary | ICD-10-CM | POA: Diagnosis present

## 2014-12-27 DIAGNOSIS — K5900 Constipation, unspecified: Secondary | ICD-10-CM | POA: Diagnosis present

## 2014-12-27 DIAGNOSIS — D509 Iron deficiency anemia, unspecified: Secondary | ICD-10-CM | POA: Diagnosis present

## 2014-12-27 DIAGNOSIS — Z885 Allergy status to narcotic agent status: Secondary | ICD-10-CM | POA: Diagnosis not present

## 2014-12-27 DIAGNOSIS — R1084 Generalized abdominal pain: Secondary | ICD-10-CM

## 2014-12-27 DIAGNOSIS — R109 Unspecified abdominal pain: Secondary | ICD-10-CM | POA: Diagnosis present

## 2014-12-27 DIAGNOSIS — R112 Nausea with vomiting, unspecified: Secondary | ICD-10-CM | POA: Diagnosis present

## 2014-12-27 DIAGNOSIS — E860 Dehydration: Secondary | ICD-10-CM | POA: Diagnosis present

## 2014-12-27 DIAGNOSIS — F329 Major depressive disorder, single episode, unspecified: Secondary | ICD-10-CM | POA: Diagnosis present

## 2014-12-27 DIAGNOSIS — I10 Essential (primary) hypertension: Secondary | ICD-10-CM | POA: Diagnosis present

## 2014-12-27 DIAGNOSIS — K529 Noninfective gastroenteritis and colitis, unspecified: Secondary | ICD-10-CM | POA: Diagnosis present

## 2014-12-27 DIAGNOSIS — R188 Other ascites: Secondary | ICD-10-CM | POA: Diagnosis present

## 2014-12-27 DIAGNOSIS — F32A Depression, unspecified: Secondary | ICD-10-CM | POA: Diagnosis present

## 2014-12-27 DIAGNOSIS — D573 Sickle-cell trait: Secondary | ICD-10-CM | POA: Diagnosis present

## 2014-12-27 DIAGNOSIS — A084 Viral intestinal infection, unspecified: Secondary | ICD-10-CM | POA: Diagnosis present

## 2014-12-27 DIAGNOSIS — E871 Hypo-osmolality and hyponatremia: Secondary | ICD-10-CM | POA: Diagnosis present

## 2014-12-27 DIAGNOSIS — Z9884 Bariatric surgery status: Secondary | ICD-10-CM | POA: Diagnosis not present

## 2014-12-27 HISTORY — DX: Family history of other specified conditions: Z84.89

## 2014-12-27 HISTORY — DX: Pneumonia, unspecified organism: J18.9

## 2014-12-27 HISTORY — DX: Post-traumatic stress disorder, unspecified: F43.10

## 2014-12-27 HISTORY — DX: Unspecified osteoarthritis, unspecified site: M19.90

## 2014-12-27 HISTORY — DX: Migraine, unspecified, not intractable, without status migrainosus: G43.909

## 2014-12-27 HISTORY — DX: Personal history of other medical treatment: Z92.89

## 2014-12-27 LAB — COMPREHENSIVE METABOLIC PANEL
ALT: 20 U/L (ref 0–35)
AST: 40 U/L — ABNORMAL HIGH (ref 0–37)
Albumin: 4.2 g/dL (ref 3.5–5.2)
Alkaline Phosphatase: 109 U/L (ref 39–117)
Anion gap: 9 (ref 5–15)
BUN: 6 mg/dL (ref 6–23)
CALCIUM: 9.9 mg/dL (ref 8.4–10.5)
CO2: 28 mmol/L (ref 19–32)
Chloride: 92 mmol/L — ABNORMAL LOW (ref 96–112)
Creatinine, Ser: 0.72 mg/dL (ref 0.50–1.10)
Glucose, Bld: 143 mg/dL — ABNORMAL HIGH (ref 70–99)
Potassium: 3.7 mmol/L (ref 3.5–5.1)
SODIUM: 129 mmol/L — AB (ref 135–145)
Total Bilirubin: 0.8 mg/dL (ref 0.3–1.2)
Total Protein: 8.3 g/dL (ref 6.0–8.3)

## 2014-12-27 LAB — CBC WITH DIFFERENTIAL/PLATELET
BASOS ABS: 0 10*3/uL (ref 0.0–0.1)
BASOS PCT: 0 % (ref 0–1)
EOS ABS: 0 10*3/uL (ref 0.0–0.7)
EOS PCT: 0 % (ref 0–5)
HCT: 30.8 % — ABNORMAL LOW (ref 36.0–46.0)
Hemoglobin: 9.1 g/dL — ABNORMAL LOW (ref 12.0–15.0)
Lymphocytes Relative: 7 % — ABNORMAL LOW (ref 12–46)
Lymphs Abs: 0.5 10*3/uL — ABNORMAL LOW (ref 0.7–4.0)
MCH: 19.6 pg — ABNORMAL LOW (ref 26.0–34.0)
MCHC: 29.5 g/dL — ABNORMAL LOW (ref 30.0–36.0)
MCV: 66.4 fL — AB (ref 78.0–100.0)
MONO ABS: 0.1 10*3/uL (ref 0.1–1.0)
Monocytes Relative: 2 % — ABNORMAL LOW (ref 3–12)
Neutro Abs: 6.8 10*3/uL (ref 1.7–7.7)
Neutrophils Relative %: 91 % — ABNORMAL HIGH (ref 43–77)
Platelets: 245 10*3/uL (ref 150–400)
RBC: 4.64 MIL/uL (ref 3.87–5.11)
RDW: 18.4 % — AB (ref 11.5–15.5)
WBC: 7.4 10*3/uL (ref 4.0–10.5)

## 2014-12-27 LAB — URINALYSIS, ROUTINE W REFLEX MICROSCOPIC
Bilirubin Urine: NEGATIVE
GLUCOSE, UA: NEGATIVE mg/dL
Hgb urine dipstick: NEGATIVE
KETONES UR: 40 mg/dL — AB
LEUKOCYTES UA: NEGATIVE
Nitrite: NEGATIVE
PROTEIN: NEGATIVE mg/dL
Specific Gravity, Urine: 1.015 (ref 1.005–1.030)
UROBILINOGEN UA: 1 mg/dL (ref 0.0–1.0)
pH: 5.5 (ref 5.0–8.0)

## 2014-12-27 LAB — OCCULT BLOOD X 1 CARD TO LAB, STOOL: FECAL OCCULT BLD: NEGATIVE

## 2014-12-27 LAB — LIPASE, BLOOD: Lipase: 24 U/L (ref 11–59)

## 2014-12-27 LAB — I-STAT CG4 LACTIC ACID, ED: LACTIC ACID, VENOUS: 0.61 mmol/L (ref 0.5–2.0)

## 2014-12-27 MED ORDER — ONDANSETRON HCL 4 MG/2ML IJ SOLN
4.0000 mg | Freq: Once | INTRAMUSCULAR | Status: AC
  Administered 2014-12-27: 4 mg via INTRAVENOUS
  Filled 2014-12-27: qty 2

## 2014-12-27 MED ORDER — ACETAMINOPHEN 650 MG RE SUPP
650.0000 mg | Freq: Four times a day (QID) | RECTAL | Status: DC | PRN
Start: 2014-12-27 — End: 2014-12-30

## 2014-12-27 MED ORDER — ACETAMINOPHEN 325 MG PO TABS
650.0000 mg | ORAL_TABLET | Freq: Four times a day (QID) | ORAL | Status: DC | PRN
Start: 2014-12-27 — End: 2014-12-30

## 2014-12-27 MED ORDER — SODIUM CHLORIDE 0.9 % IV SOLN
80.0000 mg | Freq: Once | INTRAVENOUS | Status: AC
  Administered 2014-12-27: 80 mg via INTRAVENOUS
  Filled 2014-12-27: qty 80

## 2014-12-27 MED ORDER — CIPROFLOXACIN IN D5W 400 MG/200ML IV SOLN
400.0000 mg | Freq: Once | INTRAVENOUS | Status: AC
  Administered 2014-12-27: 400 mg via INTRAVENOUS
  Filled 2014-12-27: qty 200

## 2014-12-27 MED ORDER — SODIUM CHLORIDE 0.9 % IV BOLUS (SEPSIS)
2000.0000 mL | Freq: Once | INTRAVENOUS | Status: AC
  Administered 2014-12-27: 2000 mL via INTRAVENOUS

## 2014-12-27 MED ORDER — HYDRALAZINE HCL 20 MG/ML IJ SOLN: 5.0000 mg | Freq: Four times a day (QID) | INTRAMUSCULAR | Status: DC | PRN

## 2014-12-27 MED ORDER — IOHEXOL 300 MG/ML  SOLN
100.0000 mL | Freq: Once | INTRAMUSCULAR | Status: AC | PRN
  Administered 2014-12-27: 100 mL via INTRAVENOUS

## 2014-12-27 MED ORDER — ONDANSETRON HCL 4 MG/2ML IJ SOLN
4.0000 mg | Freq: Four times a day (QID) | INTRAMUSCULAR | Status: DC | PRN
  Administered 2014-12-27 – 2014-12-28 (×5): 4 mg via INTRAVENOUS
  Filled 2014-12-27 (×5): qty 2

## 2014-12-27 MED ORDER — METRONIDAZOLE IN NACL 5-0.79 MG/ML-% IV SOLN
500.0000 mg | Freq: Three times a day (TID) | INTRAVENOUS | Status: DC
  Administered 2014-12-27 – 2014-12-30 (×9): 500 mg via INTRAVENOUS
  Filled 2014-12-27 (×10): qty 100

## 2014-12-27 MED ORDER — IOHEXOL 300 MG/ML  SOLN
25.0000 mL | Freq: Once | INTRAMUSCULAR | Status: AC | PRN
  Administered 2014-12-27: 25 mL via ORAL

## 2014-12-27 MED ORDER — METRONIDAZOLE IN NACL 5-0.79 MG/ML-% IV SOLN
500.0000 mg | Freq: Three times a day (TID) | INTRAVENOUS | Status: DC
  Filled 2014-12-27 (×2): qty 100

## 2014-12-27 MED ORDER — ONDANSETRON HCL 4 MG PO TABS
4.0000 mg | ORAL_TABLET | Freq: Four times a day (QID) | ORAL | Status: DC | PRN
  Administered 2014-12-29: 4 mg via ORAL
  Filled 2014-12-27: qty 1

## 2014-12-27 MED ORDER — OXYCODONE HCL 5 MG PO TABS
5.0000 mg | ORAL_TABLET | ORAL | Status: DC | PRN
  Administered 2014-12-27 – 2014-12-28 (×8): 5 mg via ORAL
  Filled 2014-12-27 (×8): qty 1

## 2014-12-27 MED ORDER — FENTANYL CITRATE 0.05 MG/ML IJ SOLN
50.0000 ug | Freq: Once | INTRAMUSCULAR | Status: AC
  Administered 2014-12-27: 50 ug via INTRAVENOUS
  Filled 2014-12-27: qty 2

## 2014-12-27 MED ORDER — HYDROMORPHONE HCL 1 MG/ML IJ SOLN
1.0000 mg | Freq: Once | INTRAMUSCULAR | Status: AC
  Administered 2014-12-27: 1 mg via INTRAVENOUS
  Filled 2014-12-27: qty 1

## 2014-12-27 MED ORDER — ENOXAPARIN SODIUM 40 MG/0.4ML ~~LOC~~ SOLN
40.0000 mg | SUBCUTANEOUS | Status: DC
  Administered 2014-12-27 – 2014-12-30 (×4): 40 mg via SUBCUTANEOUS
  Filled 2014-12-27 (×4): qty 0.4

## 2014-12-27 MED ORDER — CIPROFLOXACIN IN D5W 400 MG/200ML IV SOLN
400.0000 mg | Freq: Two times a day (BID) | INTRAVENOUS | Status: DC
  Administered 2014-12-27 – 2014-12-30 (×6): 400 mg via INTRAVENOUS
  Filled 2014-12-27 (×7): qty 200

## 2014-12-27 MED ORDER — METRONIDAZOLE IN NACL 5-0.79 MG/ML-% IV SOLN
500.0000 mg | Freq: Once | INTRAVENOUS | Status: AC
  Administered 2014-12-27: 500 mg via INTRAVENOUS
  Filled 2014-12-27: qty 100

## 2014-12-27 MED ORDER — DEXTROSE-NACL 5-0.9 % IV SOLN
INTRAVENOUS | Status: DC
  Administered 2014-12-27 – 2014-12-29 (×4): via INTRAVENOUS

## 2014-12-27 MED ORDER — CIPROFLOXACIN IN D5W 400 MG/200ML IV SOLN
400.0000 mg | Freq: Two times a day (BID) | INTRAVENOUS | Status: DC
  Filled 2014-12-27: qty 200

## 2014-12-27 NOTE — H&P (Signed)
History and Physical  Julia Wheeler:[id number] DOB: 03-12-1966 DOA: 12/27/2014  Referring physician: Loren Racer, ER physician PCP: No primary care physician  Chief Complaint: Nausea, vomiting and abdominal pain  HPI: Julia Wheeler is a 49 y.o. female  With past medical history of hypertension, gastric bypass and depression as well as previous episode of intussusception who was in her usual state of health when she states yesterday evening she went to a fast food place for dinner. She was supposed to go to a movie after, that for the movie started, she was in the bathroom with nausea vomiting and multiple episodes of diarrhea. She went home and continued to have further episodes. Symptoms persisted and so she came to the emergency room. In the emergency room, patient was noted to have a normal white count with a 91% shift, mild low sodium of 129 and a CT scan which noted proximal small bowel thickening concerning for severe enteritis infectious versus inflammatory versus ischemic. Lactic acid level checked and found to be unrevealing. General surgery evaluated patient and felt that this is more likely infectious. Patient started on IV fluids and hospitalist call for further evaluation and admission.   Review of Systems:  Patient seen after arrival to floor Pt complains of persisting nausea. She feels like she do not throw up or have anymore diarrhea as she is active is nothing there. She feels very weak.  Pt denies any headache, vision changes, dysphagia, chest pain, palpitations, shortness of breath, wheeze, cough, hematuria, dysuria, constipation, focal extremity numbness or weakness or pain.  Review of systems are otherwise negative  Past Medical History  Diagnosis Date  . Sickle cell trait   . Hypertension   . Iron deficiency anemia 2008, 2015   Past Surgical History  Procedure Laterality Date  . Laparoscopic gastric bypass  2005    In [place], [place]  . Laparoscopic  transabdominal hernia  2008    [name] (internal hernia with SBR)  . Colonoscopy N/A 05/19/2014    Procedure: COLONOSCOPY;  Surgeon: Rachael Fee, MD;  Location: [facility] ENDOSCOPY;  Service: Endoscopy;  Laterality: N/A;  . Esophagogastroduodenoscopy N/A 05/19/2014    Procedure: ESOPHAGOGASTRODUODENOSCOPY (EGD);  Surgeon: Rachael Fee, MD;  Location: [facility] ENDOSCOPY;  Service: Endoscopy;  Laterality: N/A;  . Laparoscopic small bowel resection N/A 05/21/2014    Procedure: DIAGNOSTIC LAPAROSCOPy ;  Surgeon: Ardeth Sportsman, MD;  Location: [facility] ORS;  Service: General;  Laterality: N/A;   Social History:  reports that she has never smoked. She does not have any smokeless tobacco history on file. She reports that she drinks alcohol. Her drug history is not on file. Patient lives at home with her boyfriend & is able to participate in activities of daily living with out assistance  Allergies  Allergen Reactions  . Morphine And Related     Makes me crazy    Family history: Hypertension,CVA, CAD  Prior to Admission medications   Medication Sig Start Date End Date Taking? Authorizing Provider  acetaminophen (TYLENOL) 325 MG tablet Take 2 tablets (650 mg total) by mouth every 6 (six) hours as needed (Do not take more than 4000 mg of tylenol (acetaminophen) per day.). 05/22/14  Yes Sherrie George, PA-C  citalopram (CELEXA) 20 MG tablet Take 20 mg by mouth daily.   Yes Historical Provider, MD  diazepam (VALIUM) 10 MG tablet Take 10 mg by mouth at bedtime as needed for anxiety.   Yes Historical Provider, MD  FLUoxetine (PROZAC)  10 MG capsule Take 10 mg by mouth daily.   Yes Historical Provider, MD  lisinopril-hydrochlorothiazide (PRINZIDE,ZESTORETIC) 10-12.5 MG per tablet Take 1 tablet by mouth daily.   Yes Historical Provider, MD  Multiple Vitamins-Iron (MULTIVITAMINS WITH IRON) TABS tablet Multivitamin with iron, take one daily.  Any over the counter brand is fine. Patient not taking: Reported on 12/27/2014  05/22/14   Sherrie George, PA-C  oxyCODONE (ROXICODONE) 5 MG immediate release tablet Take 1-2 tablets (5-10 mg total) by mouth every 4 (four) hours as needed for severe pain. Patient not taking: Reported on 12/27/2014 05/22/14   Sherrie George, PA-C  polyethylene glycol [facility] Ethelene Hal) packet You may take this as per label instructions as needed for acute constipation.  You can buy this over the counter. Patient not taking: Reported on 12/27/2014 05/22/14   Sherrie George, PA-C  saccharomyces boulardii (FLORASTOR) 250 MG capsule Take 1 capsule (250 mg total) by mouth 2 (two) times daily. Patient not taking: Reported on 12/27/2014 05/22/14   Sherrie George, PA-C    Physical Exam: BP 129/89 mmHg  Pulse 92  Temp(Src) 97.4 F (36.3 C) (Oral)  Resp 18  Ht  (1.727 m)  Wt 90.719 kg (200 lb)  BMI 30.42 kg/m2  SpO2 100%  LMP 11/25/2014 (Approximate)  General:  Alert and oriented 3, no acute distress Eyes: Sclera nonicteric, extraocular movements are intact ENT: Normocephalic, atraumatic, mucous membranes are dry Neck: No JVD Cardiovascular: Regular rate and rhythm, S1 and S2 Respiratory: Clear to auscultation bilaterally Abdomen: Soft, generalized nonspecific tenderness, nondistended, hypoactive bowel sounds Skin: No skin breaks, tears or lesions Musculoskeletal: No clubbing or cyanosis or edema Psychiatric: Patient is appropriate, no evidence of psychoses Neurologic: No focal deficits           Labs on Admission:  Basic Metabolic Panel:  Recent Labs Lab 12/26/14 2355  NA 129*  K 3.7  CL 92*  CO2 28  GLUCOSE 143*  BUN 6  CREATININE 0.72  CALCIUM 9.9   Liver Function Tests:  Recent Labs Lab 12/26/14 2355  AST 40*  ALT 20  ALKPHOS 109  BILITOT 0.8  PROT 8.3  ALBUMIN 4.2    Recent Labs Lab 12/26/14 2322  LIPASE 24   No results for input(s): AMMONIA in the last 168 hours. CBC:  Recent Labs Lab 12/26/14 2355  WBC 7.4  NEUTROABS 6.8  HGB 9.1*    HCT 30.8*  MCV 66.4*  PLT 245   Cardiac Enzymes: No results for input(s): CKTOTAL, CKMB, CKMBINDEX, TROPONINI in the last 168 hours.  BNP (last 3 results) No results for input(s): BNP in the last 8760 hours.  ProBNP (last 3 results) No results for input(s): PROBNP in the last 8760 hours.  CBG: No results for input(s): GLUCAP in the last 168 hours.  Radiological Exams on Admission: Ct Abdomen Pelvis W Contrast  12/27/2014   CLINICAL DATA:  Patient with left-sided abdominal pain. Nausea, vomiting and diarrhea.  EXAM: CT ABDOMEN AND PELVIS WITH CONTRAST  TECHNIQUE: Multidetector CT imaging of the abdomen and pelvis was performed using the standard protocol following bolus administration of intravenous contrast.  CONTRAST:  OMNIPAQUE IOHEXOL 300 MG/ML  SOLN  COMPARISON:  05/17/2014  FINDINGS: Lower chest: No consolidative or nodular pulmonary opacities. Minimal dependent atelectasis left lung base normal heart size.  Hepatobiliary: Liver is normal in size and contour without focal hepatic lesion identified. Gallbladder is decompressed. No intrahepatic or extrahepatic biliary ductal dilatation.  Pancreas: Unremarkable  Spleen: Unremarkable  Adrenals/Urinary Tract: Normal adrenal glands. Kidneys enhance symmetrically with contrast. No hydronephrosis. Urinary bladder is unremarkable.  Stomach/Bowel: Postoperative changes compatible with gastric bypass procedure. There is marked circumferential wall thickening of the proximal small bowel to the level of the anastomosis within the left upper quadrant with associated mesenteric fat stranding and surrounding fluid. Mildly dilated small bowel (4 cm) near the small bowel anastomosis. Colon is relatively decompressed. Normal appendix.  Vascular/Lymphatic: Normal caliber abdominal aorta. No retroperitoneal lymphadenopathy.  Other: Uterus is unremarkable. There is a moderate amount of fluid/ascites throughout the abdomen.  Musculoskeletal: Lower lumbar  spine degenerative change. No aggressive or acute appearing osseous lesions.  IMPRESSION: There is marked thickening of the proximal small bowel to the level of the anastomosis with surrounding fluid and mesenteric fat stranding. Findings are concerning for severe enteritis, differential includes infectious, inflammatory or ischemic etiologies.  Postoperative changes compatible with gastric bypass. Small bowel is mildly dilated near the small bowel anastomosis.  Interval development of a moderate amount of ascites.  Critical Value/emergent results were called by telephone at the time of interpretation on 12/27/2014 at 7:12 am to Dr. Loren Racer , who verbally acknowledged these results.   Electronically Signed   By: Annia Belt M.D.   On: 12/27/2014 07:13     Assessment/Plan Present on Admission:   . Enteritis with abdominal pain and secondary nausea and vomiting: With lactic acid level normal, no signs of any bowel ischemia. Will treat with IV fluids, medicine for pain and nausea and IV antibiotics. Ice chips for now. Advance diet as tolerated.   [name] Hyponatremia: Secondary to dehydration. Mild. Hydrate.  . Essential hypertension: Holding oral medication. When necessary IV hydralazine.  . Depression: Holding oral SSRIs  . Anemia, iron deficiency: Watch hemoglobin.  Consultants: Gen. surgery  Code Status: Full code  Family Communication: Patient's boyfriend left before I got there.   Disposition Plan: Home in next 1-2 days once able to tolerate solid food  Time spent: 35 minutes  Hollice Espy [facility] Pager [phone]

## 2014-12-27 NOTE — Progress Notes (Signed)
UR Completed.  [phone]  

## 2014-12-27 NOTE — ED Notes (Signed)
Surgery at bedside.

## 2014-12-27 NOTE — ED Provider Notes (Signed)
CSN: [id number]     Arrival date & time 12/26/14  2313 History  This chart was scribed for Loren Racer, MD by Abel Presto, ED Scribe. This patient was seen in room B17C/B17C and the patient's care was started at 1:29 AM.    Chief Complaint  Patient presents with  . Emesis  . Diarrhea    Patient is a 49 y.o. female presenting with vomiting and diarrhea. The history is provided by the patient. No language interpreter was used.  Emesis Associated symptoms: abdominal pain and diarrhea   Associated symptoms: no chills and no headaches   Diarrhea Associated symptoms: abdominal pain and vomiting   Associated symptoms: no chills, no fever and no headaches    HPI Comments: Julia Wheeler is a 49 y.o. female with h/o of intussusception, HTN, iron deficiency, and laparoscopic gastric bypass who presents to the Emergency Department complaining of vomiting and diarrhea with onset around 2:30 PM. Pt states she ate chicken nuggets from [employer] around 2 PM. Pt notes associated nausea began first. Pt notes left sided abdominal pain that has been constant since this afternoon. She had small amount of hematochezia around 9 AM.   Pt has only eaten 3 saltine crackers and 2 tsp of sugarsince. Pt states emesis is foam-like now. Pt notes symptoms feel like food poisoning. Pt denies fever and hematemesis. No sick contacts.  Past Medical History  Diagnosis Date  . Sickle cell trait   . Hypertension   . Iron deficiency anemia 2008, 2015   Past Surgical History  Procedure Laterality Date  . Laparoscopic gastric bypass  2005    In [place], [place]  . Laparoscopic transabdominal hernia  2008    [name] (internal hernia with SBR)  . Colonoscopy N/A 05/19/2014    Procedure: COLONOSCOPY;  Surgeon: Rachael Fee, MD;  Location: [facility] ENDOSCOPY;  Service: Endoscopy;  Laterality: N/A;  . Esophagogastroduodenoscopy N/A 05/19/2014    Procedure: ESOPHAGOGASTRODUODENOSCOPY (EGD);  Surgeon: Rachael Fee, MD;   Location: [facility]COPY;  Service: Endoscopy;  Laterality: N/A;  . Laparoscopic small bowel resection N/A 05/21/2014    Procedure: DIAGNOSTIC LAPAROSCOPy ;  Surgeon: Ardeth Sportsman, MD;  Location: [facility] ORS;  Service: General;  Laterality: N/A;   History reviewed. No pertinent family history. History  Substance Use Topics  . Smoking status: Never Smoker   . Smokeless tobacco: Not on file  . Alcohol Use: Yes   OB History    No data available     Review of Systems  Constitutional: Negative for fever and chills.  Respiratory: Negative for shortness of breath.   Cardiovascular: Negative for chest pain.  Gastrointestinal: Positive for nausea, vomiting, abdominal pain, diarrhea and blood in stool. Negative for constipation.  Genitourinary: Negative for dysuria, frequency and flank pain.  Musculoskeletal: Negative for back pain, neck pain and neck stiffness.  Skin: Negative for rash and wound.  Neurological: Negative for dizziness, weakness, light-headedness, numbness and headaches.  All other systems reviewed and are negative.     Allergies  Morphine and related  Home Medications   Prior to Admission medications   Medication Sig Start Date End Date Taking? Authorizing Provider  acetaminophen (TYLENOL) 325 MG tablet Take 2 tablets (650 mg total) by mouth every 6 (six) hours as needed (Do not take more than 4000 mg of tylenol (acetaminophen) per day.). 05/22/14  Yes Sherrie George, PA-C  citalopram (CELEXA) 20 MG tablet Take 20 mg by mouth daily.   Yes Historical Provider,  MD  diazepam (VALIUM) 10 MG tablet Take 10 mg by mouth at bedtime as needed for anxiety.   Yes Historical Provider, MD  FLUoxetine (PROZAC) 10 MG capsule Take 10 mg by mouth daily.   Yes Historical Provider, MD  lisinopril-hydrochlorothiazide (PRINZIDE,ZESTORETIC) 10-12.5 MG per tablet Take 1 tablet by mouth daily.   Yes Historical Provider, MD  Multiple Vitamins-Iron (MULTIVITAMINS WITH IRON) TABS tablet Multivitamin  with iron, take one daily.  Any over the counter brand is fine. Patient not taking: Reported on 12/27/2014 05/22/14   Sherrie George, PA-C  oxyCODONE (ROXICODONE) 5 MG immediate release tablet Take 1-2 tablets (5-10 mg total) by mouth every 4 (four) hours as needed for severe pain. Patient not taking: Reported on 12/27/2014 05/22/14   Sherrie George, PA-C  polyethylene glycol [facility] / Ethelene Hal) packet You may take this as per label instructions as needed for acute constipation.  You can buy this over the counter. Patient not taking: Reported on 12/27/2014 05/22/14   Sherrie George, PA-C  saccharomyces boulardii (FLORASTOR) 250 MG capsule Take 1 capsule (250 mg total) by mouth 2 (two) times daily. Patient not taking: Reported on 12/27/2014 05/22/14   Sherrie George, PA-C   BP 142/87 mmHg  Pulse 96  Temp(Src) 98.2 F (36.8 C) (Oral)  Resp 17  Ht  (1.727 m)  Wt 200 lb (90.719 kg)  BMI 30.42 kg/m2  SpO2 98%  LMP 11/25/2014 (Approximate) Physical Exam  Constitutional: She is oriented to person, place, and time. She appears well-developed and well-nourished. No distress.  HENT:  Head: Normocephalic and atraumatic.  Mouth/Throat: Oropharynx is clear and moist.  Eyes: Conjunctivae and EOM are normal. Pupils are equal, round, and reactive to light.  Neck: Normal range of motion. Neck supple.  Cardiovascular: Normal rate and regular rhythm.   Pulmonary/Chest: Effort normal and breath sounds normal. No respiratory distress. She has no wheezes. She has no rales.  Abdominal: Soft. Bowel sounds are normal. She exhibits no distension and no mass. There is tenderness (mild tenderness to palpation in the left upper quadrant. No rebound or guarding.). There is no rebound and no guarding.  Musculoskeletal: Normal range of motion. She exhibits no edema or tenderness.  Neurological: She is alert and oriented to person, place, and time.  Skin: Skin is warm and dry. No rash noted. No erythema.   Psychiatric: She has a normal mood and affect. Her behavior is normal.  Nursing note and vitals reviewed.   ED Course  Procedures (including critical care time) DIAGNOSTIC STUDIES: Oxygen Saturation is 100% on room air, normal by my interpretation.    COORDINATION OF CARE: 1:37 AM Discussed treatment plan with patient at beside, the patient agrees with the plan and has no further questions at this time.   Labs Review Labs Reviewed  CBC WITH DIFFERENTIAL/PLATELET - Abnormal; Notable for the following:    Hemoglobin 9.1 (*)    HCT 30.8 (*)    MCV 66.4 (*)    MCH 19.6 (*)    MCHC 29.5 (*)    RDW 18.4 (*)    Neutrophils Relative % 91 (*)    Lymphocytes Relative 7 (*)    Monocytes Relative 2 (*)    Lymphs Abs 0.5 (*)    All other components within normal limits  COMPREHENSIVE METABOLIC PANEL - Abnormal; Notable for the following:    Sodium 129 (*)    Chloride 92 (*)    Glucose, Bld 143 (*)    AST 40 (*)  All other components within normal limits  URINALYSIS, ROUTINE W REFLEX MICROSCOPIC - Abnormal; Notable for the following:    Ketones, ur 40 (*)    All other components within normal limits  LIPASE, BLOOD  OCCULT BLOOD X 1 CARD TO LAB, STOOL  BASIC METABOLIC PANEL  CBC  I-STAT CG4 LACTIC ACID, ED    Imaging Review Ct Abdomen Pelvis W Contrast  12/27/2014   CLINICAL DATA:  Patient with left-sided abdominal pain. Nausea, vomiting and diarrhea.  EXAM: CT ABDOMEN AND PELVIS WITH CONTRAST  TECHNIQUE: Multidetector CT imaging of the abdomen and pelvis was performed using the standard protocol following bolus administration of intravenous contrast.  CONTRAST:  OMNIPAQUE IOHEXOL 300 MG/ML  SOLN  COMPARISON:  05/17/2014  FINDINGS: Lower chest: No consolidative or nodular pulmonary opacities. Minimal dependent atelectasis left lung base normal heart size.  Hepatobiliary: Liver is normal in size and contour without focal hepatic lesion identified. Gallbladder is decompressed. No  intrahepatic or extrahepatic biliary ductal dilatation.  Pancreas: Unremarkable  Spleen: Unremarkable  Adrenals/Urinary Tract: Normal adrenal glands. Kidneys enhance symmetrically with contrast. No hydronephrosis. Urinary bladder is unremarkable.  Stomach/Bowel: Postoperative changes compatible with gastric bypass procedure. There is marked circumferential wall thickening of the proximal small bowel to the level of the anastomosis within the left upper quadrant with associated mesenteric fat stranding and surrounding fluid. Mildly dilated small bowel (4 cm) near the small bowel anastomosis. Colon is relatively decompressed. Normal appendix.  Vascular/Lymphatic: Normal caliber abdominal aorta. No retroperitoneal lymphadenopathy.  Other: Uterus is unremarkable. There is a moderate amount of fluid/ascites throughout the abdomen.  Musculoskeletal: Lower lumbar spine degenerative change. No aggressive or acute appearing osseous lesions.  IMPRESSION: There is marked thickening of the proximal small bowel to the level of the anastomosis with surrounding fluid and mesenteric fat stranding. Findings are concerning for severe enteritis, differential includes infectious, inflammatory or ischemic etiologies.  Postoperative changes compatible with gastric bypass. Small bowel is mildly dilated near the small bowel anastomosis.  Interval development of a moderate amount of ascites.  Critical Value/emergent results were called by telephone at the time of interpretation on 12/27/2014 at 7:12 am to Dr. Loren Racer , who verbally acknowledged these results.   Electronically Signed   By: Annia Belt M.D.   On: 12/27/2014 07:13     EKG Interpretation None      MDM   Final diagnoses:  Abdominal pain    I personally performed the services described in this documentation, which was scribed in my presence. The recorded information has been reviewed and is accurate.  Discussed with [facility] hospitalist. We'll see patient and  likely admit. Also discussed with surgery who will consult on the patient.    Loren Racer, MD 12/27/14 [phone]

## 2014-12-27 NOTE — ED Notes (Signed)
Pt returned from Ct.

## 2014-12-27 NOTE — ED Notes (Signed)
Pt reports food poisoning from [employer] today at 1400.  Pt reports n/v/d.  Pt has tenderness to abdomen.  Pt alert and oriented.

## 2014-12-27 NOTE — ED Notes (Addendum)
Pt taken to CT.

## 2014-12-27 NOTE — ED Notes (Signed)
Called CT to inform of patient having finished oral contrast.

## 2014-12-27 NOTE — Consult Note (Signed)
Reason for Consult: rule out ischemic bowel, abdominal pain and diarrhea  Referring Physician: Dr. Julianne Rice   HPI: Julia Wheeler is a 49 year old fmale with a history of gastric bypass in 2005([place]), intussusception 05/2014 s/p laparoscopy which was negative, chronic anemia who presents with abdominal pain and diarrhea.  Duration of symptoms is 1 day.  Onset was sudden.  Coarse is improving.  Time pattern is constant.  Moderate in severity.  Associated with vomiting and diarrhea which started 1/2 hour she had chicken nuggets from [employer].  She denies ill close contacts.  States significant other had a burger and felt fine.  She denies fever or chills.  She reports an episode of hematochezia yesterday which she has had intermittently.  She had an endoscopy and a colonoscopy in July of 2015 for this complaint which was grossly negative.    At present time, she has not vomited in over 4 hours.  Her pain is better and has not had additional diarrhea.  Her work up shows a normal white count and a normal lactic acid.  CT of abdomen and pelvis shows thickening of proximal bowel near the anastomosis site with surrounding mesenteric fat stranding, ascites.  We have been asked to evaluate for possible ischemic bowel.   Past Medical History  Diagnosis Date  . Sickle cell trait   . Hypertension   . Iron deficiency anemia 2008, 2015    Past Surgical History  Procedure Laterality Date  . Laparoscopic gastric bypass  2005    In [place], [place]  . Laparoscopic transabdominal hernia  2008    Dr Johney Maine (internal hernia with SBR)  . Colonoscopy N/A 05/19/2014    Procedure: COLONOSCOPY;  Surgeon: Milus Banister, MD;  Location: [place];  Service: Endoscopy;  Laterality: N/A;  . Esophagogastroduodenoscopy N/A 05/19/2014    Procedure: ESOPHAGOGASTRODUODENOSCOPY (EGD);  Surgeon: Milus Banister, MD;  Location: [place];  Service: Endoscopy;  Laterality: N/A;  . Laparoscopic small bowel resection  N/A 05/21/2014    Procedure: DIAGNOSTIC LAPAROSCOPy ;  Surgeon: Adin Hector, MD;  Location: [facility] ORS;  Service: General;  Laterality: N/A;    History reviewed. No pertinent family history.  Social History:  reports that she has never smoked. She does not have any smokeless tobacco history on file. She reports that she drinks alcohol. Her drug history is not on file.  Allergies:  Allergies  Allergen Reactions  . Morphine And Related     Makes me crazy    Medications:  Prior to Admission medications   Medication Sig Start Date End Date Taking? Authorizing Provider  acetaminophen (TYLENOL) 325 MG tablet Take 2 tablets (650 mg total) by mouth every 6 (six) hours as needed (Do not take more than 4000 mg of tylenol (acetaminophen) per day.). 05/22/14  Yes Earnstine Regal, PA-C  citalopram (CELEXA) 20 MG tablet Take 20 mg by mouth daily.   Yes Historical Provider, MD  diazepam (VALIUM) 10 MG tablet Take 10 mg by mouth at bedtime as needed for anxiety.   Yes Historical Provider, MD  FLUoxetine (PROZAC) 10 MG capsule Take 10 mg by mouth daily.   Yes Historical Provider, MD  lisinopril-hydrochlorothiazide (PRINZIDE,ZESTORETIC) 10-12.5 MG per tablet Take 1 tablet by mouth daily.   Yes Historical Provider, MD  Multiple Vitamins-Iron (MULTIVITAMINS WITH IRON) TABS tablet Multivitamin with iron, take one daily.  Any over the counter brand is fine. Patient not taking: Reported on 12/27/2014 05/22/14   Earnstine Regal, PA-C  oxyCODONE (ROXICODONE) 5 MG immediate release tablet Take 1-2 tablets (5-10 mg total) by mouth every 4 (four) hours as needed for severe pain. Patient not taking: Reported on 12/27/2014 05/22/14   Earnstine Regal, PA-C  polyethylene glycol [facility] / Floria Raveling) packet You may take this as per label instructions as needed for acute constipation.  You can buy this over the counter. Patient not taking: Reported on 12/27/2014 05/22/14   Earnstine Regal, PA-C  saccharomyces boulardii  (FLORASTOR) 250 MG capsule Take 1 capsule (250 mg total) by mouth 2 (two) times daily. Patient not taking: Reported on 12/27/2014 05/22/14   Earnstine Regal, PA-C     Results for orders placed or performed during the hospital encounter of 12/27/14 (from the past 48 hour(s))  Lipase, blood     Status: None   Collection Time: 12/26/14 11:22 PM  Result Value Ref Range   Lipase 24 11 - 59 U/L  CBC with Differential/Platelet     Status: Abnormal   Collection Time: 12/26/14 11:55 PM  Result Value Ref Range   WBC 7.4 4.0 - 10.5 K/uL   RBC 4.64 3.87 - 5.11 MIL/uL   Hemoglobin 9.1 (L) 12.0 - 15.0 g/dL   HCT 30.8 (L) 36.0 - 46.0 %   MCV 66.4 (L) 78.0 - 100.0 fL   MCH 19.6 (L) 26.0 - 34.0 pg   MCHC 29.5 (L) 30.0 - 36.0 g/dL   RDW 18.4 (H) 11.5 - 15.5 %   Platelets 245 150 - 400 K/uL   Neutrophils Relative % 91 (H) 43 - 77 %   Lymphocytes Relative 7 (L) 12 - 46 %   Monocytes Relative 2 (L) 3 - 12 %   Eosinophils Relative 0 0 - 5 %   Basophils Relative 0 0 - 1 %   Neutro Abs 6.8 1.7 - 7.7 K/uL   Lymphs Abs 0.5 (L) 0.7 - 4.0 K/uL   Monocytes Absolute 0.1 0.1 - 1.0 K/uL   Eosinophils Absolute 0.0 0.0 - 0.7 K/uL   Basophils Absolute 0.0 0.0 - 0.1 K/uL   RBC Morphology POLYCHROMASIA PRESENT     Comment: TARGET CELLS  Comprehensive metabolic panel     Status: Abnormal   Collection Time: 12/26/14 11:55 PM  Result Value Ref Range   Sodium 129 (L) 135 - 145 mmol/L   Potassium 3.7 3.5 - 5.1 mmol/L   Chloride 92 (L) 96 - 112 mmol/L   CO2 28 19 - 32 mmol/L   Glucose, Bld 143 (H) 70 - 99 mg/dL   BUN 6 6 - 23 mg/dL   Creatinine, Ser 0.72 0.50 - 1.10 mg/dL   Calcium 9.9 8.4 - 10.5 mg/dL   Total Protein 8.3 6.0 - 8.3 g/dL   Albumin 4.2 3.5 - 5.2 g/dL   AST 40 (H) 0 - 37 U/L   ALT 20 0 - 35 U/L   Alkaline Phosphatase 109 39 - 117 U/L   Total Bilirubin 0.8 0.3 - 1.2 mg/dL   GFR calc non Af Amer >90 >90 mL/min   GFR calc Af Amer >90 >90 mL/min    Comment: (NOTE) The eGFR has been calculated  using the CKD EPI equation. This calculation has not been validated in all clinical situations. eGFR's persistently <90 mL/min signify possible Chronic Kidney Disease.    Anion gap 9 5 - 15  Occult blood card to lab, stool RN will collect     Status: None   Collection Time: 12/27/14  1:55 AM  Result Value Ref  Range   Fecal Occult Bld NEGATIVE NEGATIVE  Urinalysis, Routine w reflex microscopic     Status: Abnormal   Collection Time: 12/27/14  6:00 AM  Result Value Ref Range   Color, Urine YELLOW YELLOW   APPearance CLEAR CLEAR   Specific Gravity, Urine 1.015 1.005 - 1.030   pH 5.5 5.0 - 8.0   Glucose, UA NEGATIVE NEGATIVE mg/dL   Hgb urine dipstick NEGATIVE NEGATIVE   Bilirubin Urine NEGATIVE NEGATIVE   Ketones, ur 40 (A) NEGATIVE mg/dL   Protein, ur NEGATIVE NEGATIVE mg/dL   Urobilinogen, UA 1.0 0.0 - 1.0 mg/dL   Nitrite NEGATIVE NEGATIVE   Leukocytes, UA NEGATIVE NEGATIVE    Comment: MICROSCOPIC NOT DONE ON URINES WITH NEGATIVE PROTEIN, BLOOD, LEUKOCYTES, NITRITE, OR GLUCOSE <1000 mg/dL.  I-Stat CG4 Lactic Acid, ED     Status: None   Collection Time: 12/27/14  7:35 AM  Result Value Ref Range   Lactic Acid, Venous 0.61 0.5 - 2.0 mmol/L    Ct Abdomen Pelvis W Contrast  12/27/2014   CLINICAL DATA:  Patient with left-sided abdominal pain. Nausea, vomiting and diarrhea.  EXAM: CT ABDOMEN AND PELVIS WITH CONTRAST  TECHNIQUE: Multidetector CT imaging of the abdomen and pelvis was performed using the standard protocol following bolus administration of intravenous contrast.  CONTRAST:  126m OMNIPAQUE IOHEXOL 300 MG/ML  SOLN  COMPARISON:  05/17/2014  FINDINGS: Lower chest: No consolidative or nodular pulmonary opacities. Minimal dependent atelectasis left lung base normal heart size.  Hepatobiliary: Liver is normal in size and contour without focal hepatic lesion identified. Gallbladder is decompressed. No intrahepatic or extrahepatic biliary ductal dilatation.  Pancreas: Unremarkable   Spleen: Unremarkable  Adrenals/Urinary Tract: Normal adrenal glands. Kidneys enhance symmetrically with contrast. No hydronephrosis. Urinary bladder is unremarkable.  Stomach/Bowel: Postoperative changes compatible with gastric bypass procedure. There is marked circumferential wall thickening of the proximal small bowel to the level of the anastomosis within the left upper quadrant with associated mesenteric fat stranding and surrounding fluid. Mildly dilated small bowel (4 cm) near the small bowel anastomosis. Colon is relatively decompressed. Normal appendix.  Vascular/Lymphatic: Normal caliber abdominal aorta. No retroperitoneal lymphadenopathy.  Other: Uterus is unremarkable. There is a moderate amount of fluid/ascites throughout the abdomen.  Musculoskeletal: Lower lumbar spine degenerative change. No aggressive or acute appearing osseous lesions.  IMPRESSION: There is marked thickening of the proximal small bowel to the level of the anastomosis with surrounding fluid and mesenteric fat stranding. Findings are concerning for severe enteritis, differential includes infectious, inflammatory or ischemic etiologies.  Postoperative changes compatible with gastric bypass. Small bowel is mildly dilated near the small bowel anastomosis.  Interval development of a moderate amount of ascites.  Critical Value/emergent results were called by telephone at the time of interpretation on 12/27/2014 at 7:12 am to Dr. DJulianne Rice, who verbally acknowledged these results.   Electronically Signed   By: DLovey NewcomerM.D.   On: 12/27/2014 07:13    Review of Systems  All other systems reviewed and are negative.  Blood pressure 114/77, pulse 99, temperature 97.7 F (36.5 C), temperature source Oral, resp. rate 16, last menstrual period 11/25/2014, SpO2 99 %. Physical Exam  Constitutional: She is oriented to person, place, and time. She appears well-developed and well-nourished. No distress.  Cardiovascular: Normal rate,  regular rhythm, normal heart sounds and intact distal pulses.  Exam reveals no gallop and no friction rub.   No murmur heard. Respiratory: Effort normal and breath sounds normal. No respiratory distress.  She has no wheezes. She has no rales. She exhibits no tenderness.  GI: Soft. Bowel sounds are normal. She exhibits no distension and no mass. There is no rebound and no guarding.  She exhibits very minimal tenderness to deep palpation to her LUQ.   Musculoskeletal: Normal range of motion. She exhibits no edema or tenderness.  Neurological: She is alert and oriented to person, place, and time.  Skin: Skin is warm and dry. She is not diaphoretic.  Psychiatric: She has a normal mood and affect. Her behavior is normal. Judgment and thought content normal.    Assessment/Plan: Small bowel thickening and mesenteric stranding near the anastomosis site Ascites Enteritis  The patient does not have an acute abdomen on physical exam.  Lactic acid and white count are normal. She likely has enteritis.  We agree with admission, IV hydration and antibiotics.  Spoke with Dr. Hassell Done regarding the peritoneal fluid which is likely reactive from enteritis.  He has graciously offered to follow the patient on outpatient basis for her bypass.  We will follow along throughout the hospitalization.  Thank you for the consult.   Shakina Choy ANP-BC 12/27/2014, 10:26 AM

## 2014-12-28 ENCOUNTER — Encounter (HOSPITAL_COMMUNITY): Payer: Self-pay | Admitting: General Practice

## 2014-12-28 LAB — BASIC METABOLIC PANEL
Anion gap: 9 (ref 5–15)
BUN: <5 mg/dL — ABNORMAL LOW (ref 6–23)
CALCIUM: 8.5 mg/dL (ref 8.4–10.5)
CO2: 28 mmol/L (ref 19–32)
CREATININE: 0.96 mg/dL (ref 0.50–1.10)
Chloride: 98 mmol/L (ref 96–112)
GFR calc Af Amer: 80 mL/min — ABNORMAL LOW (ref >90)
GFR calc non Af Amer: 69 mL/min — ABNORMAL LOW (ref >90)
GLUCOSE: 102 mg/dL — AB (ref 70–99)
POTASSIUM: 3.2 mmol/L — AB (ref 3.5–5.1)
SODIUM: 135 mmol/L (ref 135–145)

## 2014-12-28 LAB — RETICULOCYTES
RBC.: 3.71 MIL/uL — ABNORMAL LOW (ref 3.87–5.11)
RETIC CT PCT: 1.2 % (ref 0.4–3.1)
Retic Count, Absolute: 44.5 10*3/uL (ref 19.0–186.0)

## 2014-12-28 LAB — CBC
HCT: 26.3 % — ABNORMAL LOW (ref 36.0–46.0)
Hemoglobin: 7.9 g/dL — ABNORMAL LOW (ref 12.0–15.0)
MCH: 19.7 pg — ABNORMAL LOW (ref 26.0–34.0)
MCHC: 30 g/dL (ref 30.0–36.0)
MCV: 65.4 fL — AB (ref 78.0–100.0)
PLATELETS: 216 10*3/uL (ref 150–400)
RBC: 4.02 MIL/uL (ref 3.87–5.11)
RDW: 18.4 % — ABNORMAL HIGH (ref 11.5–15.5)
WBC: 6.4 10*3/uL (ref 4.0–10.5)

## 2014-12-28 MED ORDER — CHLORHEXIDINE GLUCONATE 0.12 % MT SOLN
15.0000 mL | Freq: Two times a day (BID) | OROMUCOSAL | Status: DC
  Administered 2014-12-28: 15 mL via OROMUCOSAL
  Filled 2014-12-28 (×2): qty 15

## 2014-12-28 MED ORDER — SODIUM CHLORIDE 0.9 % IV BOLUS (SEPSIS)
500.0000 mL | Freq: Once | INTRAVENOUS | Status: AC
  Administered 2014-12-28: 500 mL via INTRAVENOUS

## 2014-12-28 MED ORDER — SACCHAROMYCES BOULARDII 250 MG PO CAPS
250.0000 mg | ORAL_CAPSULE | Freq: Two times a day (BID) | ORAL | Status: DC
Start: 2014-12-28 — End: 2014-12-30
  Administered 2014-12-28 – 2014-12-30 (×5): 250 mg via ORAL
  Filled 2014-12-28 (×6): qty 1

## 2014-12-28 MED ORDER — PROMETHAZINE HCL 25 MG/ML IJ SOLN
12.5000 mg | Freq: Four times a day (QID) | INTRAMUSCULAR | Status: DC | PRN
  Administered 2014-12-28 – 2014-12-29 (×4): 12.5 mg via INTRAVENOUS
  Filled 2014-12-28 (×4): qty 1

## 2014-12-28 MED ORDER — DIAZEPAM 5 MG PO TABS
10.0000 mg | ORAL_TABLET | Freq: Every evening | ORAL | Status: DC | PRN
  Administered 2014-12-28 – 2014-12-29 (×2): 10 mg via ORAL
  Filled 2014-12-28 (×2): qty 2

## 2014-12-28 MED ORDER — OXYCODONE HCL 5 MG PO TABS
5.0000 mg | ORAL_TABLET | ORAL | Status: DC | PRN
  Administered 2014-12-29 – 2014-12-30 (×4): 10 mg via ORAL
  Filled 2014-12-28 (×4): qty 2

## 2014-12-28 MED ORDER — POTASSIUM CHLORIDE 10 MEQ/100ML IV SOLN
10.0000 meq | INTRAVENOUS | Status: AC
  Administered 2014-12-28 (×2): 10 meq via INTRAVENOUS
  Filled 2014-12-28 (×2): qty 100

## 2014-12-28 MED ORDER — CETYLPYRIDINIUM CHLORIDE 0.05 % MT LIQD
7.0000 mL | Freq: Two times a day (BID) | OROMUCOSAL | Status: DC
  Administered 2014-12-28 (×2): 7 mL via OROMUCOSAL

## 2014-12-28 NOTE — Progress Notes (Signed)
PATIENT DETAILS Name: Julia Wheeler Age: 49 y.o. Sex: female Date of Birth: Nov 13, 1966 Admit Date: 12/27/2014 Admitting Physician Hollice Espy, MD [id number], Provider, MD  Subjective: Abdominal pain better, no vomiting still nauseous. No diarrhea seems to have resolved  Assessment/Plan: Principal Problem:   Enteritis: Likely infectious-viral etiology. Continue IV fluids, empiric Cipro Flagyl. If diarrhea recurs, send stool studies. Continue with supportive care with antiemetics and IV fluids. Abdomen is soft and nontender.  Active Problems:   Anemia: Has chronic microcytic anemia, EGD/ colonoscopy on July 2015 was negative. Obtain anemia panel, apparently is supposed to be on iron for which she does not take a regular basis. Decrease in hemoglobin likely secondary to IV fluid hydration. No overt blood loss apparent, suspect may be secondary to absorption issues given history of gastric bypass.    Hyponatremia: Resolved with IV fluids. Likely secondary to dehydration    Hypokalemia: Replete and recheck. Likely secondary to GI loss    Hypertension: The pressure currently controlled, she claims she was recently started on anti- hypertensive medication-follow and resume accordingly    History of gastric bypass in 2005  Disposition: Remain inpatient  Antibiotics:  See below   Anti-infectives    Start     Dose/Rate Route Frequency Ordered Stop   12/27/14 2030  ciprofloxacin (CIPRO) IVPB 400 mg     400 mg 200 mL/hr over 60 Minutes Intravenous Every 12 hours 12/27/14 1246     12/27/14 1800  metroNIDAZOLE (FLAGYL) IVPB 500 mg     500 mg 100 mL/hr over 60 Minutes Intravenous Every 8 hours 12/27/14 1247     12/27/14 1230  ciprofloxacin (CIPRO) IVPB 400 mg  Status:  Discontinued     400 mg 200 mL/hr over 60 Minutes Intravenous Every 12 hours 12/27/14 1137 12/27/14 1246   12/27/14 1230  metroNIDAZOLE (FLAGYL) IVPB 500 mg  Status:  Discontinued     500 mg 100 mL/hr  over 60 Minutes Intravenous Every 8 hours 12/27/14 1137 12/27/14 1247   12/27/14 0800  ciprofloxacin (CIPRO) IVPB 400 mg     400 mg 200 mL/hr over 60 Minutes Intravenous  Once 12/27/14 0748 12/27/14 0955   12/27/14 0800  metroNIDAZOLE (FLAGYL) IVPB 500 mg     500 mg 100 mL/hr over 60 Minutes Intravenous  Once 12/27/14 0748 12/27/14 1056      DVT Prophylaxis: Prophylactic Lovenox  Code Status: Full code   Family Communication None at bedside-patient fully awake and alert understands above plan.  Procedures:  None  CONSULTS:  general surgery  Time spent 40 minutes-which includes 50% of the time with face-to-face with patient/ family and coordinating care related to the above assessment and plan.  MEDICATIONS: Scheduled Meds: . antiseptic oral rinse  7 mL Mouth Rinse q12n4p  . chlorhexidine  15 mL Mouth Rinse BID  . ciprofloxacin  400 mg Intravenous Q12H  . enoxaparin (LOVENOX) injection  40 mg Subcutaneous Q24H  . metronidazole  500 mg Intravenous Q8H   Continuous Infusions: . dextrose 5 % and 0.9% NaCl 100 mL/hr at 12/28/14 0027   PRN Meds:.acetaminophen **OR** acetaminophen, hydrALAZINE, ondansetron **OR** ondansetron (ZOFRAN) IV, oxyCODONE, promethazine    PHYSICAL EXAM: Vital signs in last 24 hours: Filed Vitals:   12/27/14 1854 12/27/14 2137 12/28/14 0530 12/28/14 0612  BP: 136/81 142/87 143/99   Pulse: 90 96 90   Temp: 98.8 F (37.1 C) 98.2 F (36.8 C) 97.9 F (36.6 C)   TempSrc:  Oral Oral Oral   Resp: Height:      Weight:    90.8 kg (200 lb 2.8 oz)  SpO2: 99% 98% 98%     Weight change:  Filed Weights   12/27/14 1033 12/28/14 0612  Weight: 90.719 kg (200 lb) 90.8 kg (200 lb 2.8 oz)   Body mass index is 30.44 kg/(m^2).   Gen Exam: Awake and alert with clear speech.  Neck: Supple, No JVD.   Chest: B/L Clear.   CVS: S1 S2 Regular, no murmurs.  Abdomen: soft, BS +, non tender, non distended.  Extremities: no edema, lower extremities  warm to touch. Neurologic: Non Focal.   Skin: No Rash.   Wounds: N/A.    Intake/Output from previous day:  Intake/Output Summary (Last 24 hours) at 12/28/14 1147 Last data filed at 12/28/14 0923  Gross per 24 hour  Intake   1231 ml  Output      0 ml  Net   1231 ml     LAB RESULTS: CBC  Recent Labs Lab 12/26/14 2355 12/28/14 0523  WBC 7.4 6.4  HGB 9.1* 7.9*  HCT 30.8* 26.3*  PLT 245 216  MCV 66.4* 65.4*  MCH 19.6* 19.7*  MCHC 29.5* 30.0  RDW 18.4* 18.4*  LYMPHSABS 0.5*  --   MONOABS 0.1  --   EOSABS 0.0  --   BASOSABS 0.0  --     Chemistries   Recent Labs Lab 12/26/14 2355 12/28/14 0523  NA 129* 135  K 3.7 3.2*  CL 92* 98  CO2 28 28  GLUCOSE 143* 102*  BUN 6 <5*  CREATININE 0.72 0.96  CALCIUM 9.9 8.5    CBG: No results for input(s): GLUCAP in the last 168 hours.  GFR Estimated Creatinine Clearance: 84.5 mL/min (by C-G formula based on Cr of 0.96).  Coagulation profile No results for input(s): INR, PROTIME in the last 168 hours.  Cardiac Enzymes No results for input(s): CKMB, TROPONINI, MYOGLOBIN in the last 168 hours.  Invalid input(s): CK  Invalid input(s): POCBNP No results for input(s): DDIMER in the last 72 hours. No results for input(s): HGBA1C in the last 72 hours. No results for input(s): CHOL, HDL, LDLCALC, TRIG, CHOLHDL, LDLDIRECT in the last 72 hours. No results for input(s): TSH, T4TOTAL, T3FREE, THYROIDAB in the last 72 hours.  Invalid input(s): FREET3 No results for input(s): VITAMINB12, FOLATE, FERRITIN, TIBC, IRON, RETICCTPCT in the last 72 hours.  Recent Labs  12/26/14 2322  LIPASE 24    Urine Studies No results for input(s): UHGB, CRYS in the last 72 hours.  Invalid input(s): UACOL, UAPR, USPG, UPH, UTP, UGL, UKET, UBIL, UNIT, UROB, ULEU, UEPI, UWBC, URBC, UBAC, CAST, UCOM, BILUA  MICROBIOLOGY: No results found for this or any previous visit (from the past 240 hour(s)).  RADIOLOGY STUDIES/RESULTS: Ct Abdomen  Pelvis W Contrast  12/27/2014   CLINICAL DATA:  Patient with left-sided abdominal pain. Nausea, vomiting and diarrhea.  EXAM: CT ABDOMEN AND PELVIS WITH CONTRAST  TECHNIQUE: Multidetector CT imaging of the abdomen and pelvis was performed using the standard protocol following bolus administration of intravenous contrast.  CONTRAST:  OMNIPAQUE IOHEXOL 300 MG/ML  SOLN  COMPARISON:  05/17/2014  FINDINGS: Lower chest: No consolidative or nodular pulmonary opacities. Minimal dependent atelectasis left lung base normal heart size.  Hepatobiliary: Liver is normal in size and contour without focal hepatic lesion identified. Gallbladder is decompressed. No intrahepatic or extrahepatic biliary ductal dilatation.  Pancreas: Unremarkable  Spleen: Unremarkable  Adrenals/Urinary Tract: Normal adrenal glands. Kidneys enhance symmetrically with contrast. No hydronephrosis. Urinary bladder is unremarkable.  Stomach/Bowel: Postoperative changes compatible with gastric bypass procedure. There is marked circumferential wall thickening of the proximal small bowel to the level of the anastomosis within the left upper quadrant with associated mesenteric fat stranding and surrounding fluid. Mildly dilated small bowel (4 cm) near the small bowel anastomosis. Colon is relatively decompressed. Normal appendix.  Vascular/Lymphatic: Normal caliber abdominal aorta. No retroperitoneal lymphadenopathy.  Other: Uterus is unremarkable. There is a moderate amount of fluid/ascites throughout the abdomen.  Musculoskeletal: Lower lumbar spine degenerative change. No aggressive or acute appearing osseous lesions.  IMPRESSION: There is marked thickening of the proximal small bowel to the level of the anastomosis with surrounding fluid and mesenteric fat stranding. Findings are concerning for severe enteritis, differential includes infectious, inflammatory or ischemic etiologies.  Postoperative changes compatible with gastric bypass. Small bowel is  mildly dilated near the small bowel anastomosis.  Interval development of a moderate amount of ascites.  Critical Value/emergent results were called by telephone at the time of interpretation on 12/27/2014 at 7:12 am to Dr. Loren Racer , who verbally acknowledged these results.   Electronically Signed   By: Annia Belt M.D.   On: 12/27/2014 07:13    Jeoffrey Massed, MD  [facility] Pager:[phone]  If 7PM-7AM, please contact night-coverage [email or web address] Password [facility] 12/28/2014, 11:47 AM   LOS: 1 day

## 2014-12-28 NOTE — Progress Notes (Signed)
Patient ID: Julia Wheeler, female   DOB: August 04, 1966, 49 y.o.   MRN: [id number]     [facility]      [address] [place], [place] [address]    Phone: [phone] FAX: [phone]     Subjective: No vomiting or diarrhea since yesterday morning.  C/o nausea.  Afebrile. VSS.  WBC normal today.    Objective:  Vital signs:  Filed Vitals:   12/27/14 1854 12/27/14 2137 12/28/14 0530 12/28/14 0612  BP: 136/81 142/87 143/99   Pulse: 90 96 90   Temp: 98.8 F (37.1 C) 98.2 F (36.8 C) 97.9 F (36.6 C)   TempSrc: Oral Oral Oral   Resp: _0 Height:      Weight:    200 lb 2.8 oz (90.8 kg)  SpO2: 99% 98% 98%     Last BM Date: 12/27/14  Intake/Output   Yesterday:  02/14 0701 - 02/15 0700 In: 1231 [I.V.:1231] Out: -    I/O last 3 completed shifts: In: 1231 [I.V.:1231] Out: -     Physical Exam: General: Pt awake/alert/oriented x4 in no acute distress Abdomen: Soft.  Nondistended.  Mild TTP to LUQ.  No evidence of peritonitis.  No incarcerated hernias.    Problem List:   Principal Problem:   Enteritis Active Problems:   Abdominal pain   Nausea with vomiting   Hyponatremia   Essential hypertension   Depression   Anemia, iron deficiency    Results:   Labs: Results for orders placed or performed during the hospital encounter of 12/27/14 (from the past 48 hour(s))  Lipase, blood     Status: None   Collection Time: 12/26/14 11:22 PM  Result Value Ref Range   Lipase 24 11 - 59 U/L  CBC with Differential/Platelet     Status: Abnormal   Collection Time: 12/26/14 11:55 PM  Result Value Ref Range   WBC 7.4 4.0 - 10.5 K/uL   RBC 4.64 3.87 - 5.11 MIL/uL   Hemoglobin 9.1 (L) 12.0 - 15.0 g/dL   HCT 30.8 (L) 36.0 - 46.0 %   MCV 66.4 (L) 78.0 - 100.0 fL   MCH 19.6 (L) 26.0 - 34.0 pg   MCHC 29.5 (L) 30.0 - 36.0 g/dL   RDW 18.4 (H) 11.5 - 15.5 %   Platelets 245 150 - 400 K/uL   Neutrophils Relative % 91 (H) 43 - 77  %   Lymphocytes Relative 7 (L) 12 - 46 %   Monocytes Relative 2 (L) 3 - 12 %   Eosinophils Relative 0 0 - 5 %   Basophils Relative 0 0 - 1 %   Neutro Abs 6.8 1.7 - 7.7 K/uL   Lymphs Abs 0.5 (L) 0.7 - 4.0 K/uL   Monocytes Absolute 0.1 0.1 - 1.0 K/uL   Eosinophils Absolute 0.0 0.0 - 0.7 K/uL   Basophils Absolute 0.0 0.0 - 0.1 K/uL   RBC Morphology POLYCHROMASIA PRESENT     Comment: TARGET CELLS  Comprehensive metabolic panel     Status: Abnormal   Collection Time: 12/26/14 11:55 PM  Result Value Ref Range   Sodium 129 (L) 135 - 145 mmol/L   Potassium 3.7 3.5 - 5.1 mmol/L   Chloride 92 (L) 96 - 112 mmol/L   CO2 28 19 - 32 mmol/L   Glucose, Bld 143 (H) 70 - 99 mg/dL   BUN 6 6 - 23 mg/dL   Creatinine, Ser 0.72 0.50 -  1.10 mg/dL   Calcium 9.9 8.4 - 10.5 mg/dL   Total Protein 8.3 6.0 - 8.3 g/dL   Albumin 4.2 3.5 - 5.2 g/dL   AST 40 (H) 0 - 37 U/L   ALT 20 0 - 35 U/L   Alkaline Phosphatase 109 39 - 117 U/L   Total Bilirubin 0.8 0.3 - 1.2 mg/dL   GFR calc non Af Amer >90 >90 mL/min   GFR calc Af Amer >90 >90 mL/min    Comment: (NOTE) The eGFR has been calculated using the CKD EPI equation. This calculation has not been validated in all clinical situations. eGFR's persistently <90 mL/min signify possible Chronic Kidney Disease.    Anion gap 9 5 - 15  Occult blood card to lab, stool RN will collect     Status: None   Collection Time: 12/27/14  1:55 AM  Result Value Ref Range   Fecal Occult Bld NEGATIVE NEGATIVE  Urinalysis, Routine w reflex microscopic     Status: Abnormal   Collection Time: 12/27/14  6:00 AM  Result Value Ref Range   Color, Urine YELLOW YELLOW   APPearance CLEAR CLEAR   Specific Gravity, Urine 1.015 1.005 - 1.030   pH 5.5 5.0 - 8.0   Glucose, UA NEGATIVE NEGATIVE mg/dL   Hgb urine dipstick NEGATIVE NEGATIVE   Bilirubin Urine NEGATIVE NEGATIVE   Ketones, ur 40 (A) NEGATIVE mg/dL   Protein, ur NEGATIVE NEGATIVE mg/dL   Urobilinogen, UA 1.0 0.0 - 1.0 mg/dL    Nitrite NEGATIVE NEGATIVE   Leukocytes, UA NEGATIVE NEGATIVE    Comment: MICROSCOPIC NOT DONE ON URINES WITH NEGATIVE PROTEIN, BLOOD, LEUKOCYTES, NITRITE, OR GLUCOSE <1000 mg/dL.  I-Stat CG4 Lactic Acid, ED     Status: None   Collection Time: 12/27/14  7:35 AM  Result Value Ref Range   Lactic Acid, Venous 0.61 0.5 - 2.0 mmol/L  Basic metabolic panel     Status: Abnormal   Collection Time: 12/28/14  5:23 AM  Result Value Ref Range   Sodium 135 135 - 145 mmol/L   Potassium 3.2 (L) 3.5 - 5.1 mmol/L   Chloride 98 96 - 112 mmol/L   CO2 28 19 - 32 mmol/L   Glucose, Bld 102 (H) 70 - 99 mg/dL   BUN <5 (L) 6 - 23 mg/dL   Creatinine, Ser 0.96 0.50 - 1.10 mg/dL   Calcium 8.5 8.4 - 10.5 mg/dL   GFR calc non Af Amer 69 (L) >90 mL/min   GFR calc Af Amer 80 (L) >90 mL/min    Comment: (NOTE) The eGFR has been calculated using the CKD EPI equation. This calculation has not been validated in all clinical situations. eGFR's persistently <90 mL/min signify possible Chronic Kidney Disease.    Anion gap 9 5 - 15  CBC     Status: Abnormal   Collection Time: 12/28/14  5:23 AM  Result Value Ref Range   WBC 6.4 4.0 - 10.5 K/uL    Comment: REPEATED TO VERIFY   RBC 4.02 3.87 - 5.11 MIL/uL   Hemoglobin 7.9 (L) 12.0 - 15.0 g/dL    Comment: REPEATED TO VERIFY DELTA CHECK NOTED    HCT 26.3 (L) 36.0 - 46.0 %   MCV 65.4 (L) 78.0 - 100.0 fL   MCH 19.7 (L) 26.0 - 34.0 pg   MCHC 30.0 30.0 - 36.0 g/dL   RDW 18.4 (H) 11.5 - 15.5 %   Platelets 216 150 - 400 K/uL    Comment: REPEATED TO  VERIFY DELTA CHECK NOTED     Imaging / Studies: Ct Abdomen Pelvis W Contrast  12/27/2014   CLINICAL DATA:  Patient with left-sided abdominal pain. Nausea, vomiting and diarrhea.  EXAM: CT ABDOMEN AND PELVIS WITH CONTRAST  TECHNIQUE: Multidetector CT imaging of the abdomen and pelvis was performed using the standard protocol following bolus administration of intravenous contrast.  CONTRAST:  113m OMNIPAQUE IOHEXOL 300 MG/ML   SOLN  COMPARISON:  05/17/2014  FINDINGS: Lower chest: No consolidative or nodular pulmonary opacities. Minimal dependent atelectasis left lung base normal heart size.  Hepatobiliary: Liver is normal in size and contour without focal hepatic lesion identified. Gallbladder is decompressed. No intrahepatic or extrahepatic biliary ductal dilatation.  Pancreas: Unremarkable  Spleen: Unremarkable  Adrenals/Urinary Tract: Normal adrenal glands. Kidneys enhance symmetrically with contrast. No hydronephrosis. Urinary bladder is unremarkable.  Stomach/Bowel: Postoperative changes compatible with gastric bypass procedure. There is marked circumferential wall thickening of the proximal small bowel to the level of the anastomosis within the left upper quadrant with associated mesenteric fat stranding and surrounding fluid. Mildly dilated small bowel (4 cm) near the small bowel anastomosis. Colon is relatively decompressed. Normal appendix.  Vascular/Lymphatic: Normal caliber abdominal aorta. No retroperitoneal lymphadenopathy.  Other: Uterus is unremarkable. There is a moderate amount of fluid/ascites throughout the abdomen.  Musculoskeletal: Lower lumbar spine degenerative change. No aggressive or acute appearing osseous lesions.  IMPRESSION: There is marked thickening of the proximal small bowel to the level of the anastomosis with surrounding fluid and mesenteric fat stranding. Findings are concerning for severe enteritis, differential includes infectious, inflammatory or ischemic etiologies.  Postoperative changes compatible with gastric bypass. Small bowel is mildly dilated near the small bowel anastomosis.  Interval development of a moderate amount of ascites.  Critical Value/emergent results were called by telephone at the time of interpretation on 12/27/2014 at 7:12 am to Dr. DJulianne Rice, who verbally acknowledged these results.   Electronically Signed   By: DLovey NewcomerM.D.   On: 12/27/2014 07:13    Medications /  Allergies:  Scheduled Meds: . antiseptic oral rinse  7 mL Mouth Rinse q12n4p  . chlorhexidine  15 mL Mouth Rinse BID  . ciprofloxacin  400 mg Intravenous Q12H  . enoxaparin (LOVENOX) injection  40 mg Subcutaneous Q24H  . metronidazole  500 mg Intravenous Q8H   Continuous Infusions: . dextrose 5 % and 0.9% NaCl 100 mL/hr at 12/28/14 0027   PRN Meds:.acetaminophen **OR** acetaminophen, hydrALAZINE, ondansetron **OR** ondansetron (ZOFRAN) IV, oxyCODONE, promethazine  Antibiotics: Anti-infectives    Start     Dose/Rate Route Frequency Ordered Stop   12/27/14 2030  ciprofloxacin (CIPRO) IVPB 400 mg     400 mg 200 mL/hr over 60 Minutes Intravenous Every 12 hours 12/27/14 1246     12/27/14 1800  metroNIDAZOLE (FLAGYL) IVPB 500 mg     500 mg 100 mL/hr over 60 Minutes Intravenous Every 8 hours 12/27/14 1247     12/27/14 1230  ciprofloxacin (CIPRO) IVPB 400 mg  Status:  Discontinued     400 mg 200 mL/hr over 60 Minutes Intravenous Every 12 hours 12/27/14 1137 12/27/14 1246   12/27/14 1230  metroNIDAZOLE (FLAGYL) IVPB 500 mg  Status:  Discontinued     500 mg 100 mL/hr over 60 Minutes Intravenous Every 8 hours 12/27/14 1137 12/27/14 1247   12/27/14 0800  ciprofloxacin (CIPRO) IVPB 400 mg     400 mg 200 mL/hr over 60 Minutes Intravenous  Once 12/27/14 0748 12/27/14 0955   12/27/14  0800  metroNIDAZOLE (FLAGYL) IVPB 500 mg     500 mg 100 mL/hr over 60 Minutes Intravenous  Once 12/27/14 0748 12/27/14 1056      Assessment/Plan Small bowel thickening and mesenteric stranding near the anastomosis site Ascites Enteritis  -Normal white count, bit more tender to LUQ today, no guarding.  No acute abdomen.   -Continue with antibiotics -Bowel rest -Iv hydration Anemia Per primary team. Reported hematochezia yesterday. Has a negative endoscopy and colonoscopy for the same complaint in 05/2014. -does not take iron regularly at home Hx Gastric bypass 2005 -OP f/u with Dr. Tora Duck, [facility] Pager [phone](7A-4:30P) For consults and floor pages call [phone](7A-4:30P)  12/28/2014 9:26 AM

## 2014-12-29 DIAGNOSIS — F329 Major depressive disorder, single episode, unspecified: Secondary | ICD-10-CM

## 2014-12-29 LAB — IRON AND TIBC
Iron: 11 ug/dL — ABNORMAL LOW (ref 42–145)
Saturation Ratios: 2 % — ABNORMAL LOW (ref 20–55)
TIBC: 444 ug/dL (ref 250–470)
UIBC: 433 ug/dL — ABNORMAL HIGH (ref 125–400)

## 2014-12-29 LAB — OCCULT BLOOD X 1 CARD TO LAB, STOOL: Fecal Occult Bld: NEGATIVE

## 2014-12-29 LAB — CBC
HEMATOCRIT: 23 % — AB (ref 36.0–46.0)
HEMOGLOBIN: 6.8 g/dL — AB (ref 12.0–15.0)
MCH: 20.1 pg — ABNORMAL LOW (ref 26.0–34.0)
MCHC: 29.6 g/dL — AB (ref 30.0–36.0)
MCV: 67.8 fL — ABNORMAL LOW (ref 78.0–100.0)
Platelets: 177 10*3/uL (ref 150–400)
RBC: 3.39 MIL/uL — ABNORMAL LOW (ref 3.87–5.11)
RDW: 18.7 % — ABNORMAL HIGH (ref 11.5–15.5)
WBC: 4.8 10*3/uL (ref 4.0–10.5)

## 2014-12-29 LAB — FOLATE: FOLATE: 19.6 ng/mL

## 2014-12-29 LAB — BASIC METABOLIC PANEL
Anion gap: 11 (ref 5–15)
BUN: 8 mg/dL (ref 6–23)
CHLORIDE: 106 mmol/L (ref 96–112)
CO2: 21 mmol/L (ref 19–32)
Calcium: 8.3 mg/dL — ABNORMAL LOW (ref 8.4–10.5)
Creatinine, Ser: 0.85 mg/dL (ref 0.50–1.10)
GFR calc Af Amer: >90 mL/min (ref >90)
GFR calc non Af Amer: 80 mL/min — ABNORMAL LOW (ref >90)
GLUCOSE: 73 mg/dL (ref 70–99)
Potassium: 3.7 mmol/L (ref 3.5–5.1)
Sodium: 138 mmol/L (ref 135–145)

## 2014-12-29 LAB — VITAMIN B12: Vitamin B-12: 663 pg/mL (ref 211–911)

## 2014-12-29 LAB — FERRITIN: Ferritin: 14 ng/mL (ref 10–291)

## 2014-12-29 LAB — PREPARE RBC (CROSSMATCH)

## 2014-12-29 MED ORDER — FLUOXETINE HCL 10 MG PO CAPS
10.0000 mg | ORAL_CAPSULE | Freq: Every day | ORAL | Status: DC
  Administered 2014-12-29 – 2014-12-30 (×2): 10 mg via ORAL
  Filled 2014-12-29 (×2): qty 1

## 2014-12-29 MED ORDER — SODIUM CHLORIDE 0.9 % IV SOLN: Freq: Once | INTRAVENOUS | Status: DC

## 2014-12-29 NOTE — Clinical Documentation Improvement (Signed)
Possible Clinical Conditions?    Acute Blood Loss Anemia  Acute on chronic blood loss anemia  Chronic blood loss anemia  Precipitous drop in Hematocrit  Other Condition  Cannot Clinically Determine    Supporting Information:  Signs and Symptoms (unable to ambulate, weakness, dizziness, unable to participate in care)  Diagnostics: Component     Latest Ref Rng 12/26/2014 12/28/2014 12/29/2014            Hemoglobin     12.0 - 15.0 g/dL 9.1 (L) 7.9 (L) 6.8 (LL)  HCT     36.0 - 46.0 % 30.8 (L) 26.3 (L) 23.0 (L)    Thank You, Nevin Bloodgood, RN, BSN, CCDS,Clinical Documentation Specialist:  [phone]  [phone]=[facility]- Health Information Management

## 2014-12-29 NOTE — Progress Notes (Signed)
PATIENT DETAILS Name: Julia Wheeler Age: 49 y.o. Sex: female Date of Birth: 06/09/1966 Admit Date: 12/27/2014 Admitting Physician Hollice Espy, MD EEF:[id number], Provider, MD  Subjective: No further nausea, vomiting or diarrhea. Abdominal pain significantly better.   Assessment/Plan: Principal Problem:   Enteritis: Likely infectious-viral etiology. Continue IV fluids, empiric Cipro Flagyl. If diarrhea recurs, send stool studies. Significantly improved with supportive care with antiemetics and IV fluids. Abdomen is soft and nontender this morning. If clinical improvement continues, suspect home on 2/17  Active Problems:   Anemia with Precipitous drop in Hematocrit: Has chronic microcytic anemia, EGD/ colonoscopy on July 2015 was negative. Anemia panel confirms iron deficiency anemia-likely secondary to absorption issues given history of gastric bypass. Decrease in hemoglobin likely secondary to acute illness and IV fluids . Transfuse 1 unit of PRBC, recheck CBC in a.m. No overt GI bleeding at this time.    Hyponatremia: Resolved with IV fluids. Likely secondary to dehydration    Hypokalemia: Resolved. Likely secondary to GI loss    Hypertension: The pressure currently controlled, she claims she was recently started on anti- hypertensive medication-follow and resume accordingly    History of gastric bypass in 2005    Depression/anxiety: Resume Prozac  Disposition: Remain inpatient-home in the next 1-2 day-clinical improvement continues  Antibiotics:  See below   Anti-infectives    Start     Dose/Rate Route Frequency Ordered Stop   12/27/14 2030  ciprofloxacin (CIPRO) IVPB 400 mg     400 mg 200 mL/hr over 60 Minutes Intravenous Every 12 hours 12/27/14 1246     12/27/14 1800  metroNIDAZOLE (FLAGYL) IVPB 500 mg     500 mg 100 mL/hr over 60 Minutes Intravenous Every 8 hours 12/27/14 1247     12/27/14 1230  ciprofloxacin (CIPRO) IVPB 400 mg  Status:  Discontinued       400 mg 200 mL/hr over 60 Minutes Intravenous Every 12 hours 12/27/14 1137 12/27/14 1246   12/27/14 1230  metroNIDAZOLE (FLAGYL) IVPB 500 mg  Status:  Discontinued     500 mg 100 mL/hr over 60 Minutes Intravenous Every 8 hours 12/27/14 1137 12/27/14 1247   12/27/14 0800  ciprofloxacin (CIPRO) IVPB 400 mg     400 mg 200 mL/hr over 60 Minutes Intravenous  Once 12/27/14 0748 12/27/14 0955   12/27/14 0800  metroNIDAZOLE (FLAGYL) IVPB 500 mg     500 mg 100 mL/hr over 60 Minutes Intravenous  Once 12/27/14 0748 12/27/14 1056      DVT Prophylaxis: Prophylactic Lovenox  Code Status: Full code   Family Communication None at bedside-patient fully awake and alert understands above plan.  Procedures:  None  CONSULTS:  general surgery   MEDICATIONS: Scheduled Meds: . sodium chloride   Intravenous Once  . ciprofloxacin  400 mg Intravenous Q12H  . enoxaparin (LOVENOX) injection  40 mg Subcutaneous Q24H  . metronidazole  500 mg Intravenous Q8H  . saccharomyces boulardii  250 mg Oral BID   Continuous Infusions: . dextrose 5 % and 0.9% NaCl 75 mL/hr at 12/29/14 0119   PRN Meds:.acetaminophen **OR** acetaminophen, diazepam, hydrALAZINE, ondansetron **OR** ondansetron (ZOFRAN) IV, oxyCODONE, promethazine    PHYSICAL EXAM: Vital signs in last 24 hours: Filed Vitals:   12/29/14 0527 12/29/14 0922 12/29/14 1021 12/29/14 1058  BP: 123/86 158/95 137/84 130/77  Pulse: 80 83 97 89  Temp: 98.2 F (36.8 C)  98.2 F (36.8 C) 98.2 F (36.8 C)  TempSrc: Oral  Oral Oral  Resp: Height:      Weight:      SpO2: 97%  100% 99%    Weight change:  Filed Weights   12/27/14 1033 12/28/14 0612  Weight: 90.719 kg (200 lb) 90.8 kg (200 lb 2.8 oz)   Body mass index is 30.44 kg/(m^2).   Gen Exam: Awake and alert with clear speech. Not in distress Neck: Supple, No JVD.   Chest: B/L Clear.  No rales or rhonchi CVS: S1 S2 Regular, no murmurs.  Abdomen: soft, BS +, non tender, non  distended.  Extremities: no edema, lower extremities warm to touch. Neurologic: Non Focal.   Skin: No Rash.   Wounds: N/A.    Intake/Output from previous day:  Intake/Output Summary (Last 24 hours) at 12/29/14 1157 Last data filed at 12/29/14 1043  Gross per 24 hour  Intake   1590 ml  Output      0 ml  Net   1590 ml     LAB RESULTS: CBC  Recent Labs Lab 12/26/14 2355 12/28/14 0523 12/29/14 0413  WBC 7.4 6.4 4.8  HGB 9.1* 7.9* 6.8*  HCT 30.8* 26.3* 23.0*  PLT 245 216 177  MCV 66.4* 65.4* 67.8*  MCH 19.6* 19.7* 20.1*  MCHC 29.5* 30.0 29.6*  RDW 18.4* 18.4* 18.7*  LYMPHSABS 0.5*  --   --   MONOABS 0.1  --   --   EOSABS 0.0  --   --   BASOSABS 0.0  --   --     Chemistries   Recent Labs Lab 12/26/14 2355 12/28/14 0523 12/29/14 0413  NA 129* 135 138  K 3.7 3.2* 3.7  CL 92* 98 106  CO2 GLUCOSE 143* 102* 73  BUN 6 <5* 8  CREATININE 0.72 0.96 0.85  CALCIUM 9.9 8.5 8.3*    CBG: No results for input(s): GLUCAP in the last 168 hours.  GFR Estimated Creatinine Clearance: 95.5 mL/min (by C-G formula based on Cr of 0.85).  Coagulation profile No results for input(s): INR, PROTIME in the last 168 hours.  Cardiac Enzymes No results for input(s): CKMB, TROPONINI, MYOGLOBIN in the last 168 hours.  Invalid input(s): CK  Invalid input(s): POCBNP No results for input(s): DDIMER in the last 72 hours. No results for input(s): HGBA1C in the last 72 hours. No results for input(s): CHOL, HDL, LDLCALC, TRIG, CHOLHDL, LDLDIRECT in the last 72 hours. No results for input(s): TSH, T4TOTAL, T3FREE, THYROIDAB in the last 72 hours.  Invalid input(s): FREET3  Recent Labs  12/28/14 1859  VITAMINB12 663  FOLATE 19.6  FERRITIN 14  TIBC 444  IRON 11*  RETICCTPCT 1.2    Recent Labs  12/26/14 2322  LIPASE 24    Urine Studies No results for input(s): UHGB, CRYS in the last 72 hours.  Invalid input(s): UACOL, UAPR, USPG, UPH, UTP, UGL, UKET, UBIL,  UNIT, UROB, ULEU, UEPI, UWBC, URBC, UBAC, CAST, UCOM, BILUA  MICROBIOLOGY: No results found for this or any previous visit (from the past 240 hour(s)).  RADIOLOGY STUDIES/RESULTS: Ct Abdomen Pelvis W Contrast  12/27/2014   CLINICAL DATA:  Patient with left-sided abdominal pain. Nausea, vomiting and diarrhea.  EXAM: CT ABDOMEN AND PELVIS WITH CONTRAST  TECHNIQUE: Multidetector CT imaging of the abdomen and pelvis was performed using the standard protocol following bolus administration of intravenous contrast.  CONTRAST:  OMNIPAQUE IOHEXOL 300 MG/ML  SOLN  COMPARISON:  05/17/2014  FINDINGS: Lower chest: No  consolidative or nodular pulmonary opacities. Minimal dependent atelectasis left lung base normal heart size.  Hepatobiliary: Liver is normal in size and contour without focal hepatic lesion identified. Gallbladder is decompressed. No intrahepatic or extrahepatic biliary ductal dilatation.  Pancreas: Unremarkable  Spleen: Unremarkable  Adrenals/Urinary Tract: Normal adrenal glands. Kidneys enhance symmetrically with contrast. No hydronephrosis. Urinary bladder is unremarkable.  Stomach/Bowel: Postoperative changes compatible with gastric bypass procedure. There is marked circumferential wall thickening of the proximal small bowel to the level of the anastomosis within the left upper quadrant with associated mesenteric fat stranding and surrounding fluid. Mildly dilated small bowel (4 cm) near the small bowel anastomosis. Colon is relatively decompressed. Normal appendix.  Vascular/Lymphatic: Normal caliber abdominal aorta. No retroperitoneal lymphadenopathy.  Other: Uterus is unremarkable. There is a moderate amount of fluid/ascites throughout the abdomen.  Musculoskeletal: Lower lumbar spine degenerative change. No aggressive or acute appearing osseous lesions.  IMPRESSION: There is marked thickening of the proximal small bowel to the level of the anastomosis with surrounding fluid and mesenteric fat  stranding. Findings are concerning for severe enteritis, differential includes infectious, inflammatory or ischemic etiologies.  Postoperative changes compatible with gastric bypass. Small bowel is mildly dilated near the small bowel anastomosis.  Interval development of a moderate amount of ascites.  Critical Value/emergent results were called by telephone at the time of interpretation on 12/27/2014 at 7:12 am to Dr. Loren Racer , who verbally acknowledged these results.   Electronically Signed   By: Annia Belt M.D.   On: 12/27/2014 07:13    Jeoffrey Massed, MD  [facility] Pager:[phone]  If 7PM-7AM, please contact night-coverage [email or web address] Password [facility] 12/29/2014, 11:57 AM   LOS: 2 days

## 2014-12-29 NOTE — Progress Notes (Signed)
CRITICAL VALUE ALERT  Critical value received:  Hgb 6.8   Date of notification:  12/29/14  Time of notification:  0609  Critical value read back:Yes.    Nurse who received alert: Donivan Scull RN  MD notified (1st page):  Tama Gander NP  Time of first page:  0615  MD notified (2nd page): Tama Gander NP  Time of second [id number]  Responding MD: Tama Gander NP  Time MD responded: [phone] orders received for blood transfusion

## 2014-12-29 NOTE — Progress Notes (Signed)
Patient ID: Julia Wheeler, female   DOB: [birth year], 49 y.o.   MRN: [id number]     [facility]      [address] [place], [place] [address]    Phone: [phone] FAX: [phone]     Subjective: No further diarrhea.  hgb down to 6.8.  No overt signs of bleeding. VSS.  Improved abdominal pain.   Objective:  Vital signs:  Filed Vitals:   12/28/14 0612 12/28/14 1304 12/28/14 2206 12/29/14 0527  BP:  140/86 127/79 123/86  Pulse:  94 90 80  Temp:  99 F (37.2 C) 98.3 F (36.8 C) 98.2 F (36.8 C)  TempSrc:  Oral Oral Oral  Resp:  _0 Height:      Weight: 200 lb 2.8 oz (90.8 kg)     SpO2:  100% 99% 97%    Last BM Date: 12/27/14  Intake/Output   Yesterday:  02/15 0701 - 02/16 0700 In: 1100 [P.O.:200; I.V.:824; IV Piggyback:76] Out: -  This shift:    I/O last 3 completed shifts: In: 2331 [P.O.:200; I.V.:2055; IV Piggyback:76] Out: -      Physical Exam: General: Pt awake/alert/oriented x4 in no acute distress Abdomen: Soft. Nondistended. minimal TTP to LUQ. No evidence of peritonitis. No incarcerated hernias.    Problem List:   Principal Problem:   Enteritis Active Problems:   Abdominal pain   Nausea with vomiting   Hyponatremia   Essential hypertension   Depression   Anemia, iron deficiency    Results:   Labs: Results for orders placed or performed during the hospital encounter of 12/27/14 (from the past 48 hour(s))  Basic metabolic panel     Status: Abnormal   Collection Time: 12/28/14  5:23 AM  Result Value Ref Range   Sodium 135 135 - 145 mmol/L   Potassium 3.2 (L) 3.5 - 5.1 mmol/L   Chloride 98 96 - 112 mmol/L   CO2 28 19 - 32 mmol/L   Glucose, Bld 102 (H) 70 - 99 mg/dL   BUN <5 (L) 6 - 23 mg/dL   Creatinine, Ser 0.96 0.50 - 1.10 mg/dL   Calcium 8.5 8.4 - 10.5 mg/dL   GFR calc non Af Amer 69 (L) >90 mL/min   GFR calc Af Amer 80 (L) >90 mL/min    Comment: (NOTE) The eGFR has been  calculated using the CKD EPI equation. This calculation has not been validated in all clinical situations. eGFR's persistently <90 mL/min signify possible Chronic Kidney Disease.    Anion gap 9 5 - 15  CBC     Status: Abnormal   Collection Time: 12/28/14  5:23 AM  Result Value Ref Range   WBC 6.4 4.0 - 10.5 K/uL    Comment: REPEATED TO VERIFY   RBC 4.02 3.87 - 5.11 MIL/uL   Hemoglobin 7.9 (L) 12.0 - 15.0 g/dL    Comment: REPEATED TO VERIFY DELTA CHECK NOTED    HCT 26.3 (L) 36.0 - 46.0 %   MCV 65.4 (L) 78.0 - 100.0 fL   MCH 19.7 (L) 26.0 - 34.0 pg   MCHC 30.0 30.0 - 36.0 g/dL   RDW 18.4 (H) 11.5 - 15.5 %   Platelets 216 150 - 400 K/uL    Comment: REPEATED TO VERIFY DELTA CHECK NOTED   Vitamin B12     Status: None   Collection Time: 12/28/14  6:59 PM  Result Value Ref Range   Vitamin B-12 663 211 -  911 pg/mL    Comment: Performed at [facility]  Folate     Status: None   Collection Time: 12/28/14  6:59 PM  Result Value Ref Range   Folate 19.6 ng/mL    Comment: (NOTE) Reference Ranges        Deficient:       0.4 - 3.3 ng/mL        Indeterminate:   3.4 - 5.4 ng/mL        Normal:              > 5.4 ng/mL Performed at [facility]   Iron and TIBC     Status: Abnormal   Collection Time: 12/28/14  6:59 PM  Result Value Ref Range   Iron 11 (L) 42 - 145 ug/dL   TIBC 444 250 - 470 ug/dL   Saturation Ratios 2 (L) 20 - 55 %   UIBC 433 (H) 125 - 400 ug/dL    Comment: Performed at [facility]  Ferritin     Status: None   Collection Time: 12/28/14  6:59 PM  Result Value Ref Range   Ferritin 14 10 - 291 ng/mL    Comment: Performed at [facility]  Reticulocytes     Status: Abnormal   Collection Time: 12/28/14  6:59 PM  Result Value Ref Range   Retic Ct Pct 1.2 0.4 - 3.1 %   RBC. 3.71 (L) 3.87 - 5.11 MIL/uL   Retic Count, Manual 44.5 19.0 - 186.0 K/uL  CBC     Status: Abnormal   Collection Time: 12/29/14  4:13 AM  Result Value Ref Range    WBC 4.8 4.0 - 10.5 K/uL   RBC 3.39 (L) 3.87 - 5.11 MIL/uL   Hemoglobin 6.8 (LL) 12.0 - 15.0 g/dL    Comment: CRITICAL RESULT CALLED TO, READ BACK BY AND VERIFIED WITH: SHELTON,C RN 12/29/2014 0609 JORDANS REPEATED TO VERIFY    HCT 23.0 (L) 36.0 - 46.0 %   MCV 67.8 (L) 78.0 - 100.0 fL   MCH 20.1 (L) 26.0 - 34.0 pg   MCHC 29.6 (L) 30.0 - 36.0 g/dL   RDW 18.7 (H) 11.5 - 15.5 %   Platelets 177 150 - 400 K/uL  Basic metabolic panel     Status: Abnormal   Collection Time: 12/29/14  4:13 AM  Result Value Ref Range   Sodium 138 135 - 145 mmol/L   Potassium 3.7 3.5 - 5.1 mmol/L   Chloride 106 96 - 112 mmol/L    Comment: DELTA CHECK NOTED   CO2 21 19 - 32 mmol/L   Glucose, Bld 73 70 - 99 mg/dL   BUN 8 6 - 23 mg/dL   Creatinine, Ser 0.85 0.50 - 1.10 mg/dL   Calcium 8.3 (L) 8.4 - 10.5 mg/dL   GFR calc non Af Amer 80 (L) >90 mL/min   GFR calc Af Amer >90 >90 mL/min    Comment: (NOTE) The eGFR has been calculated using the CKD EPI equation. This calculation has not been validated in all clinical situations. eGFR's persistently <90 mL/min signify possible Chronic Kidney Disease.    Anion gap 11 5 - 15    Imaging / Studies: No results found.  Medications / Allergies:  Scheduled Meds: . sodium chloride   Intravenous Once  . ciprofloxacin  400 mg Intravenous Q12H  . enoxaparin (LOVENOX) injection  40 mg Subcutaneous Q24H  . metronidazole  500 mg Intravenous Q8H  . saccharomyces boulardii  250 mg Oral BID   Continuous Infusions: . dextrose 5 % and 0.9% NaCl 75 mL/hr at 12/29/14 0119   PRN Meds:.acetaminophen **OR** acetaminophen, diazepam, hydrALAZINE, ondansetron **OR** ondansetron (ZOFRAN) IV, oxyCODONE, promethazine  Antibiotics: Anti-infectives    Start     Dose/Rate Route Frequency Ordered Stop   12/27/14 2030  ciprofloxacin (CIPRO) IVPB 400 mg     400 mg 200 mL/hr over 60 Minutes Intravenous Every 12 hours 12/27/14 1246     12/27/14 1800  metroNIDAZOLE (FLAGYL) IVPB 500  mg     500 mg 100 mL/hr over 60 Minutes Intravenous Every 8 hours 12/27/14 1247     12/27/14 1230  ciprofloxacin (CIPRO) IVPB 400 mg  Status:  Discontinued     400 mg 200 mL/hr over 60 Minutes Intravenous Every 12 hours 12/27/14 1137 12/27/14 1246   12/27/14 1230  metroNIDAZOLE (FLAGYL) IVPB 500 mg  Status:  Discontinued     500 mg 100 mL/hr over 60 Minutes Intravenous Every 8 hours 12/27/14 1137 12/27/14 1247   12/27/14 0800  ciprofloxacin (CIPRO) IVPB 400 mg     400 mg 200 mL/hr over 60 Minutes Intravenous  Once 12/27/14 0748 12/27/14 0955   12/27/14 0800  metroNIDAZOLE (FLAGYL) IVPB 500 mg     500 mg 100 mL/hr over 60 Minutes Intravenous  Once 12/27/14 0748 12/27/14 1056      Assessment/Plan Small bowel thickening and mesenteric stranding near the anastomosis site Ascites Enteritis  -Normal white count, minimally tender, no diarrhea, tolerating PO.   -non surgical abdomen -further management per primary team. Anemia -hgb 6.8 today, no symptoms.  VSS.  Per primary team. Has a negative endoscopy and colonoscopy for the same complaint in 05/2014. -does not take iron regularly at home -iron low Hx Gastric bypass 2005 -OP f/u with Dr. Hassell Done   Will sign off.  Please call with questions or concerns.    Erby Pian, [facility] Pager [phone]) For consults and floor pages call [phone]([id number]:30P)  12/29/2014 8:10 AM

## 2014-12-30 DIAGNOSIS — R109 Unspecified abdominal pain: Secondary | ICD-10-CM

## 2014-12-30 LAB — TYPE AND SCREEN
ABO/RH(D): O POS
Antibody Screen: NEGATIVE
Unit division: 0

## 2014-12-30 LAB — CBC
HCT: 26.1 % — ABNORMAL LOW (ref 36.0–46.0)
Hemoglobin: 7.9 g/dL — ABNORMAL LOW (ref 12.0–15.0)
MCH: 20.7 pg — ABNORMAL LOW (ref 26.0–34.0)
MCHC: 30.3 g/dL (ref 30.0–36.0)
MCV: 68.3 fL — ABNORMAL LOW (ref 78.0–100.0)
PLATELETS: 184 10*3/uL (ref 150–400)
RBC: 3.82 MIL/uL — ABNORMAL LOW (ref 3.87–5.11)
RDW: 19.6 % — AB (ref 11.5–15.5)
WBC: 4.1 10*3/uL (ref 4.0–10.5)

## 2014-12-30 MED ORDER — CIPROFLOXACIN HCL 500 MG PO TABS: 500.0000 mg | ORAL_TABLET | Freq: Two times a day (BID) | ORAL | Status: DC

## 2014-12-30 MED ORDER — FLUCONAZOLE 150 MG PO TABS: 150.0000 mg | ORAL_TABLET | Freq: Every day | ORAL | Status: DC

## 2014-12-30 MED ORDER — FERROUS SULFATE 325 (65 FE) MG PO TABS: 325.0000 mg | ORAL_TABLET | Freq: Two times a day (BID) | ORAL | Status: DC

## 2014-12-30 MED ORDER — METRONIDAZOLE 500 MG PO TABS: 500.0000 mg | ORAL_TABLET | Freq: Three times a day (TID) | ORAL | Status: DC

## 2014-12-30 NOTE — Progress Notes (Signed)
D/c to home via wheelchair.  VSS. Pain denied. Breathing regular and unlabored on room air. Instructions given and understood by patient. Rx's given at d/c

## 2014-12-30 NOTE — Discharge Summary (Signed)
Physician Discharge Summary  Julia Wheeler KPT:[id number] DOB: 09-Dec-1965 DOA: 12/27/2014  PCP: Default, Provider, MD  Admit date: 12/27/2014 Discharge date: 12/30/2014  Recommendations for Outpatient Follow-up:  1. Pt will need to follow up with PCP in 2 weeks post discharge 2. Please obtain BMP in 2 weeks  Discharge Diagnoses:   Acute GastroEnteritis: Likely infectious-viral etiology. Continue IV fluids, empiric Cipro Flagyl. IfSignificantly improved with supportive care with antiemetics and IV fluids. Abdomen is soft and nontender this morning. -CT of the abdomen and pelvis at the time of admission showed proximal small bowel thickening with surrounding inflammation and fat stranding -Gen. surgery was consulted--they did not feel that the patient had any acute surgical issues -The patient was treated conservatively with opioids, IV fluids, and bowel rest -She clinically improved, and her diet was advanced which she tolerated -The patient will go home with ciprofloxacin and Flagyl for 7 additional days which will complete 10 days of therapy   Anemia , likely iron deficiency: Has chronic microcytic anemia, EGD/ colonoscopy on July 2015 was negative. Anemia panel confirms iron deficiency anemia-likely secondary to absorption issues given history of gastric bypass. Decrease in hemoglobin likely secondary to acute illness and IV fluids . Transfused 1 unit of PRBC -FOBT was neg -No overt GI bleeding at this time. -Recommended ferrous sulfate 325 mg twice a day -For the patient's constipation issues, the patient should take MiraLAX daily   Hyponatremia: Resolved with IV fluids. Likely secondary to dehydration   Hypokalemia: Resolved. Likely secondary to GI loss   Hypertension: The pressure currently controlled, she claims she was recently started on anti- hypertensive medication-follow and resume accordingly -The patient's blood pressure started trending up -She will go home back on  her home regimen of lisinopril/HCTZ   History of gastric bypass in 2005   Depression/anxiety: Resume Prozac  Discharge Condition: stable  Disposition:  Follow-up Information    Follow up with Valarie Merino, MD.   Specialty:  General Surgery   Why:  follow up gastric bypass.     Contact information:   [address] [place] [address] [phone]       Diet:soft Wt Readings from Last 3 Encounters:  12/28/14 90.8 kg (200 lb 2.8 oz)  05/17/14 79.561 kg (175 lb 6.4 oz)    History of present illness:  49 year old fmale with a history of gastric bypass in 2005([place]), intussusception 05/2014 s/p laparoscopy which was negative, chronic anemia who presents with abdominal pain and diarrhea. Duration of symptoms is 1 day. Onset was sudden. Coarse is improving. Time pattern is constant. Moderate in severity. Associated with vomiting and diarrhea which started 1/2 hour she had chicken nuggets from [employer]. She denies ill close contacts. States significant other had a burger and felt fine. She denies fever or chills. She reports an episode of hematochezia yesterday which she has had intermittently. She had an endoscopy and a colonoscopy in July of 2015 for this complaint which was grossly negative.    Her pain is better and has not had additional diarrhea. Her work up shows a normal white count and a normal lactic acid. CT of abdomen and pelvis shows thickening of proximal bowel near the anastomosis site with surrounding mesenteric fat stranding, ascites. We have been asked to evaluate for possible ischemic bowel. The patient was treated with bowel rest, intravenous fluids, and opioids. There was no acute surgical issues. She clinically improved. Her diet was advanced which she tolerated.  Consultants: General Surgery  Discharge Exam: Filed Vitals:   12/30/14 1356  BP: 128/85  Pulse: 84  Temp: 98 F (36.7 C)  Resp: 17   Filed Vitals:    12/29/14 1436 12/29/14 2206 12/30/14 0533 12/30/14 1356  BP: 143/87 129/90 137/92 128/85  Pulse: 104 85 83 84  Temp: 98.5 F (36.9 C) 98.5 F (36.9 C) 98.3 F (36.8 C) 98 F (36.7 C)  TempSrc: Oral Oral Oral Oral  Resp: Height:      Weight:      SpO2: 100% 100% 100% 100%   General: A&O x 3, NAD, pleasant, cooperative Cardiovascular: RRR, no rub, no gallop, no S3 Respiratory: CTAB, no wheeze, no rhonchi Abdomen:soft, nontender, nondistended, positive bowel sounds Extremities: No edema, No lymphangitis, no petechiae  Discharge Instructions      Discharge Instructions    Diet - low sodium heart healthy    Complete by:  As directed      Increase activity slowly    Complete by:  As directed             Medication List    STOP taking these medications        oxyCODONE 5 MG immediate release tablet  Commonly known as:  ROXICODONE      TAKE these medications        acetaminophen 325 MG tablet  Commonly known as:  TYLENOL  Take 2 tablets (650 mg total) by mouth every 6 (six) hours as needed (Do not take more than 4000 mg of tylenol (acetaminophen) per day.).     ciprofloxacin 500 MG tablet  Commonly known as:  CIPRO  Take 1 tablet (500 mg total) by mouth 2 (two) times daily.     citalopram 20 MG tablet  Commonly known as:  CELEXA  Take 20 mg by mouth daily.     diazepam 10 MG tablet  Commonly known as:  VALIUM  Take 10 mg by mouth at bedtime as needed for anxiety.     ferrous sulfate 325 (65 FE) MG tablet  Take 1 tablet (325 mg total) by mouth 2 (two) times daily with a meal.     fluconazole 150 MG tablet  Commonly known as:  DIFLUCAN  Take 1 tablet (150 mg total) by mouth daily.     FLUoxetine 10 MG capsule  Commonly known as:  PROZAC  Take 10 mg by mouth daily.     lisinopril-hydrochlorothiazide 10-12.5 MG per tablet  Commonly known as:  PRINZIDE,ZESTORETIC  Take 1 tablet by mouth daily.     metroNIDAZOLE 500 MG tablet  Commonly known  as:  FLAGYL  Take 1 tablet (500 mg total) by mouth every 8 (eight) hours.     multivitamins with iron Tabs tablet  Multivitamin with iron, take one daily.  Any over the counter brand is fine.     polyethylene glycol packet  Commonly known as:  MIRALAX / GLYCOLAX  You may take this as per label instructions as needed for acute constipation.  You can buy this over the counter.     saccharomyces boulardii 250 MG capsule  Commonly known as:  FLORASTOR  Take 1 capsule (250 mg total) by mouth 2 (two) times daily.         The results of significant diagnostics from this hospitalization (including imaging, microbiology, ancillary and laboratory) are listed below for reference.    Significant Diagnostic Studies: Ct Abdomen Pelvis W Contrast  12/27/2014  CLINICAL DATA:  Patient with left-sided abdominal pain. Nausea, vomiting and diarrhea.  EXAM: CT ABDOMEN AND PELVIS WITH CONTRAST  TECHNIQUE: Multidetector CT imaging of the abdomen and pelvis was performed using the standard protocol following bolus administration of intravenous contrast.  CONTRAST:  OMNIPAQUE IOHEXOL 300 MG/ML  SOLN  COMPARISON:  05/17/2014  FINDINGS: Lower chest: No consolidative or nodular pulmonary opacities. Minimal dependent atelectasis left lung base normal heart size.  Hepatobiliary: Liver is normal in size and contour without focal hepatic lesion identified. Gallbladder is decompressed. No intrahepatic or extrahepatic biliary ductal dilatation.  Pancreas: Unremarkable  Spleen: Unremarkable  Adrenals/Urinary Tract: Normal adrenal glands. Kidneys enhance symmetrically with contrast. No hydronephrosis. Urinary bladder is unremarkable.  Stomach/Bowel: Postoperative changes compatible with gastric bypass procedure. There is marked circumferential wall thickening of the proximal small bowel to the level of the anastomosis within the left upper quadrant with associated mesenteric fat stranding and surrounding fluid. Mildly  dilated small bowel (4 cm) near the small bowel anastomosis. Colon is relatively decompressed. Normal appendix.  Vascular/Lymphatic: Normal caliber abdominal aorta. No retroperitoneal lymphadenopathy.  Other: Uterus is unremarkable. There is a moderate amount of fluid/ascites throughout the abdomen.  Musculoskeletal: Lower lumbar spine degenerative change. No aggressive or acute appearing osseous lesions.  IMPRESSION: There is marked thickening of the proximal small bowel to the level of the anastomosis with surrounding fluid and mesenteric fat stranding. Findings are concerning for severe enteritis, differential includes infectious, inflammatory or ischemic etiologies.  Postoperative changes compatible with gastric bypass. Small bowel is mildly dilated near the small bowel anastomosis.  Interval development of a moderate amount of ascites.  Critical Value/emergent results were called by telephone at the time of interpretation on 12/27/2014 at 7:12 am to Dr. Loren Racer , who verbally acknowledged these results.   Electronically Signed   By: Annia Belt M.D.   On: 12/27/2014 07:13     Microbiology: No results found for this or any previous visit (from the past 240 hour(s)).   Labs: Basic Metabolic Panel:  Recent Labs Lab 12/26/14 2355 12/28/14 0523 12/29/14 0413  NA 129* 135 138  K 3.7 3.2* 3.7  CL 92* 98 106  CO2 GLUCOSE 143* 102* 73  BUN 6 <5* 8  CREATININE 0.72 0.96 0.85  CALCIUM 9.9 8.5 8.3*   Liver Function Tests:  Recent Labs Lab 12/26/14 2355  AST 40*  ALT 20  ALKPHOS 109  BILITOT 0.8  PROT 8.3  ALBUMIN 4.2    Recent Labs Lab 12/26/14 2322  LIPASE 24   No results for input(s): AMMONIA in the last 168 hours. CBC:  Recent Labs Lab 12/26/14 2355 12/28/14 0523 12/29/14 0413 12/30/14 0515  WBC 7.4 6.4 4.8 4.1  NEUTROABS 6.8  --   --   --   HGB 9.1* 7.9* 6.8* 7.9*  HCT 30.8* 26.3* 23.0* 26.1*  MCV 66.4* 65.4* 67.8* 68.3*  PLT 245 216 177 184    Cardiac Enzymes: No results for input(s): CKTOTAL, CKMB, CKMBINDEX, TROPONINI in the last 168 hours. BNP: Invalid input(s): POCBNP CBG: No results for input(s): GLUCAP in the last 168 hours.  Time coordinating discharge:  Greater than 30 minutes  Signed:  Shaquel Chavous, DO [facility] Pager: [phone] 12/30/2014, 2:21 PM

## 2015-08-26 ENCOUNTER — Inpatient Hospital Stay (HOSPITAL_COMMUNITY)
Admission: AD | Admit: 2015-08-26 | Discharge: 2015-09-03 | DRG: 885 | Disposition: A | Discharge status: Home / Self Care | Payer: Medicaid Other | Source: Intra-hospital | Attending: Psychiatry | Admitting: Psychiatry

## 2015-08-26 ENCOUNTER — Encounter (HOSPITAL_COMMUNITY): Payer: Self-pay | Admitting: Emergency Medicine

## 2015-08-26 ENCOUNTER — Emergency Department (HOSPITAL_COMMUNITY)
Admission: EM | Admit: 2015-08-26 | Discharge: 2015-08-26 | Disposition: A | Discharge status: Other Acute Inpatient Hospital | Payer: Medicaid Other | Attending: Emergency Medicine | Admitting: Emergency Medicine

## 2015-08-26 ENCOUNTER — Encounter (HOSPITAL_COMMUNITY): Payer: Self-pay | Admitting: *Deleted

## 2015-08-26 ENCOUNTER — Emergency Department (HOSPITAL_COMMUNITY): Payer: Medicaid Other

## 2015-08-26 DIAGNOSIS — Z8669 Personal history of other diseases of the nervous system and sense organs: Secondary | ICD-10-CM | POA: Diagnosis not present

## 2015-08-26 DIAGNOSIS — Z87442 Personal history of urinary calculi: Secondary | ICD-10-CM | POA: Diagnosis not present

## 2015-08-26 DIAGNOSIS — F1020 Alcohol dependence, uncomplicated: Secondary | ICD-10-CM | POA: Diagnosis present

## 2015-08-26 DIAGNOSIS — Z9884 Bariatric surgery status: Secondary | ICD-10-CM

## 2015-08-26 DIAGNOSIS — Z8701 Personal history of pneumonia (recurrent): Secondary | ICD-10-CM | POA: Insufficient documentation

## 2015-08-26 DIAGNOSIS — M158 Other polyosteoarthritis: Secondary | ICD-10-CM | POA: Diagnosis not present

## 2015-08-26 DIAGNOSIS — F332 Major depressive disorder, recurrent severe without psychotic features: Secondary | ICD-10-CM | POA: Diagnosis present

## 2015-08-26 DIAGNOSIS — R45851 Suicidal ideations: Secondary | ICD-10-CM

## 2015-08-26 DIAGNOSIS — Z79899 Other long term (current) drug therapy: Secondary | ICD-10-CM | POA: Insufficient documentation

## 2015-08-26 DIAGNOSIS — Z8679 Personal history of other diseases of the circulatory system: Secondary | ICD-10-CM | POA: Diagnosis not present

## 2015-08-26 DIAGNOSIS — F29 Unspecified psychosis not due to a substance or known physiological condition: Secondary | ICD-10-CM | POA: Diagnosis present

## 2015-08-26 DIAGNOSIS — Y998 Other external cause status: Secondary | ICD-10-CM | POA: Diagnosis not present

## 2015-08-26 DIAGNOSIS — Y9389 Activity, other specified: Secondary | ICD-10-CM | POA: Insufficient documentation

## 2015-08-26 DIAGNOSIS — F1010 Alcohol abuse, uncomplicated: Secondary | ICD-10-CM | POA: Diagnosis not present

## 2015-08-26 DIAGNOSIS — D509 Iron deficiency anemia, unspecified: Secondary | ICD-10-CM | POA: Insufficient documentation

## 2015-08-26 DIAGNOSIS — F4310 Post-traumatic stress disorder, unspecified: Secondary | ICD-10-CM | POA: Insufficient documentation

## 2015-08-26 DIAGNOSIS — I10 Essential (primary) hypertension: Secondary | ICD-10-CM | POA: Diagnosis present

## 2015-08-26 DIAGNOSIS — S3991XA Unspecified injury of abdomen, initial encounter: Secondary | ICD-10-CM | POA: Insufficient documentation

## 2015-08-26 DIAGNOSIS — Z915 Personal history of self-harm: Secondary | ICD-10-CM | POA: Diagnosis not present

## 2015-08-26 DIAGNOSIS — W1839XA Other fall on same level, initial encounter: Secondary | ICD-10-CM | POA: Diagnosis not present

## 2015-08-26 DIAGNOSIS — F10129 Alcohol abuse with intoxication, unspecified: Secondary | ICD-10-CM | POA: Diagnosis present

## 2015-08-26 DIAGNOSIS — F333 Major depressive disorder, recurrent, severe with psychotic symptoms: Principal | ICD-10-CM | POA: Diagnosis present

## 2015-08-26 DIAGNOSIS — Y907 Blood alcohol level of 200-239 mg/100 ml: Secondary | ICD-10-CM | POA: Diagnosis present

## 2015-08-26 DIAGNOSIS — Z62810 Personal history of physical and sexual abuse in childhood: Secondary | ICD-10-CM | POA: Diagnosis present

## 2015-08-26 DIAGNOSIS — S0990XA Unspecified injury of head, initial encounter: Secondary | ICD-10-CM | POA: Insufficient documentation

## 2015-08-26 DIAGNOSIS — Z792 Long term (current) use of antibiotics: Secondary | ICD-10-CM | POA: Diagnosis not present

## 2015-08-26 DIAGNOSIS — Y9289 Other specified places as the place of occurrence of the external cause: Secondary | ICD-10-CM | POA: Insufficient documentation

## 2015-08-26 LAB — COMPREHENSIVE METABOLIC PANEL
ALBUMIN: 4.1 g/dL (ref 3.5–5.0)
ALT: 20 U/L (ref 14–54)
ANION GAP: 12 (ref 5–15)
AST: 32 U/L (ref 15–41)
Alkaline Phosphatase: 107 U/L (ref 38–126)
BUN: 15 mg/dL (ref 6–20)
CHLORIDE: 105 mmol/L (ref 101–111)
CO2: 26 mmol/L (ref 22–32)
Calcium: 9.3 mg/dL (ref 8.9–10.3)
Creatinine, Ser: 0.76 mg/dL (ref 0.44–1.00)
GFR calc non Af Amer: >60 mL/min (ref >60)
GLUCOSE: 72 mg/dL (ref 65–99)
POTASSIUM: 3.9 mmol/L (ref 3.5–5.1)
SODIUM: 143 mmol/L (ref 135–145)
Total Bilirubin: 0.6 mg/dL (ref 0.3–1.2)
Total Protein: 7.9 g/dL (ref 6.5–8.1)

## 2015-08-26 LAB — CBC WITH DIFFERENTIAL/PLATELET
BASOS PCT: 0 %
Basophils Absolute: 0 10*3/uL (ref 0.0–0.1)
EOS ABS: 0.1 10*3/uL (ref 0.0–0.7)
EOS PCT: 1 %
HCT: 36.8 % (ref 36.0–46.0)
HEMOGLOBIN: 12.2 g/dL (ref 12.0–15.0)
LYMPHS ABS: 2.1 10*3/uL (ref 0.7–4.0)
Lymphocytes Relative: 30 %
MCH: 29.9 pg (ref 26.0–34.0)
MCHC: 33.2 g/dL (ref 30.0–36.0)
MCV: 90.2 fL (ref 78.0–100.0)
MONOS PCT: 6 %
Monocytes Absolute: 0.4 10*3/uL (ref 0.1–1.0)
NEUTROS PCT: 63 %
Neutro Abs: 4.2 10*3/uL (ref 1.7–7.7)
PLATELETS: 263 10*3/uL (ref 150–400)
RBC: 4.08 MIL/uL (ref 3.87–5.11)
RDW: 15.4 % (ref 11.5–15.5)
WBC: 6.7 10*3/uL (ref 4.0–10.5)

## 2015-08-26 LAB — CK: Total CK: 68 U/L (ref 38–234)

## 2015-08-26 LAB — ETHANOL: ALCOHOL ETHYL (B): 228 mg/dL — AB (ref <5)

## 2015-08-26 MED ORDER — ACETAMINOPHEN 325 MG PO TABS: 650.0000 mg | ORAL_TABLET | ORAL | Status: DC | PRN

## 2015-08-26 MED ORDER — ONDANSETRON HCL 4 MG PO TABS: 4.0000 mg | ORAL_TABLET | Freq: Three times a day (TID) | ORAL | Status: DC | PRN

## 2015-08-26 MED ORDER — SODIUM CHLORIDE 0.9 % IV BOLUS (SEPSIS)
1000.0000 mL | Freq: Once | INTRAVENOUS | Status: AC
  Administered 2015-08-26: 1000 mL via INTRAVENOUS

## 2015-08-26 MED ORDER — ZOLPIDEM TARTRATE 5 MG PO TABS: 5.0000 mg | ORAL_TABLET | Freq: Every evening | ORAL | Status: DC | PRN

## 2015-08-26 MED ORDER — LORAZEPAM 1 MG PO TABS: 1.0000 mg | ORAL_TABLET | Freq: Three times a day (TID) | ORAL | Status: DC | PRN

## 2015-08-26 MED ORDER — TRAZODONE HCL 50 MG PO TABS: 50.0000 mg | ORAL_TABLET | Freq: Every evening | ORAL | Status: DC | PRN

## 2015-08-26 MED ORDER — SERTRALINE HCL 25 MG PO TABS
25.0000 mg | ORAL_TABLET | Freq: Every day | ORAL | Status: DC
  Administered 2015-08-26 – 2015-08-27 (×2): 25 mg via ORAL
  Filled 2015-08-26 (×3): qty 1

## 2015-08-26 MED ORDER — THIAMINE HCL 100 MG/ML IJ SOLN: 100.0000 mg | Freq: Every day | INTRAMUSCULAR | Status: DC

## 2015-08-26 MED ORDER — LORAZEPAM 1 MG PO TABS: 0.0000 mg | ORAL_TABLET | Freq: Two times a day (BID) | ORAL | Status: DC

## 2015-08-26 MED ORDER — VITAMIN B-1 100 MG PO TABS
100.0000 mg | ORAL_TABLET | Freq: Every day | ORAL | Status: DC
  Administered 2015-08-26 – 2015-08-27 (×2): 100 mg via ORAL
  Filled 2015-08-26 (×3): qty 1

## 2015-08-26 MED ORDER — VITAMIN B-1 100 MG PO TABS: 100.0000 mg | ORAL_TABLET | Freq: Every day | ORAL | Status: DC

## 2015-08-26 MED ORDER — HYDROXYZINE HCL 25 MG PO TABS
25.0000 mg | ORAL_TABLET | ORAL | Status: DC | PRN
  Administered 2015-08-30 – 2015-09-03 (×11): 25 mg via ORAL
  Filled 2015-08-26 (×12): qty 1

## 2015-08-26 MED ORDER — LORAZEPAM 1 MG PO TABS
0.0000 mg | ORAL_TABLET | Freq: Four times a day (QID) | ORAL | Status: DC
  Administered 2015-08-26 – 2015-08-27 (×3): 1 mg via ORAL
  Filled 2015-08-26 (×3): qty 1

## 2015-08-26 MED ORDER — LORAZEPAM 1 MG PO TABS: 0.0000 mg | ORAL_TABLET | Freq: Four times a day (QID) | ORAL | Status: DC

## 2015-08-26 MED ORDER — OLANZAPINE 2.5 MG PO TABS
2.5000 mg | ORAL_TABLET | Freq: Every day | ORAL | Status: DC
  Administered 2015-08-26 – 2015-09-02 (×8): 2.5 mg via ORAL
  Filled 2015-08-26 (×11): qty 1

## 2015-08-26 NOTE — Progress Notes (Signed)
UA collected and sent to lab.

## 2015-08-26 NOTE — ED Notes (Signed)
Pt in room crying, telling daughter at bedside to leave  Daughter told pt that she was not leaving her  Pt became more upset  Daughter to waiting area

## 2015-08-26 NOTE — Tx Team (Addendum)
Initial Interdisciplinary Treatment Plan   PATIENT STRESSORS: Health problems Substance abuse Traumatic event   PATIENT STRENGTHS: Ability for insight Active sense of humor Average or above average intelligence   PROBLEM LIST: Problem List/Patient Goals Date to be addressed Date deferred Reason deferred Estimated date of resolution  Alcoholism 10/13016     depression 10/13016     PTSD  08/26/15     Suicidality 08/26/15     HTN 08/26/15     " I dont think my BF understands my condition" " I wouldn't care if I didn't wake up tomorrow"                         DISCHARGE CRITERIA:  Ability to meet basic life and health needs Adequate post-discharge living arrangements Improved stabilization in mood, thinking, and/or behavior  PRELIMINARY DISCHARGE PLAN: Attend aftercare/continuing care group Attend PHP/IOP  PATIENT/FAMIILY INVOLVEMENT: This treatment plan has been presented to and reviewed with the patient, Julia Wheeler, and/or family member, .  The patient and family have been given the opportunity to ask questions and make suggestions.  Javeria, Lenis 08/26/2015, 10:58 AM
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
## 2015-08-26 NOTE — ED Notes (Signed)
Pt discharged ambulatory with [place] driver.  All belongings were returned to pt.
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
## 2015-08-26 NOTE — ED Notes (Signed)
Pt oriented to room and unit.  She is very tearful.  She states "i still feel like i wouldn't mind not waking up"  I reassured pt. Of her safety and she contracted for safety.  Video monitoring and 15 minute checks in place.

## 2015-08-26 NOTE — Progress Notes (Signed)
Patient ID: Julia Wheeler, female   DOB: 10/21/1966, 49 y.o.   MRN: [id number] D: Client visible on the unit, minimal interaction noted.Client is pleasant, but preoccupied, report day been "alright" admits to racing thoughts and voices, but then regresses "I don't know" A: Write reviewed medications, administered as ordered, encouraged client to report any side effects. Staff will monitor q30min for safety.R: Client is safe on the unit, attended  [place] and sang.

## 2015-08-26 NOTE — BHH Suicide Risk Assessment (Signed)
[facility] INPATIENT:  Family/Significant Other Suicide Prevention Education  Suicide Prevention Education:  Patient Refusal for Family/Significant Other Suicide Prevention Education: The patient Julia Wheeler has refused to provide written consent for family/significant other to be provided Family/Significant Other Suicide Prevention Education during admission and/or prior to discharge.  Physician notified.    Sallee Lange 08/26/2015, 12:44 PM

## 2015-08-26 NOTE — BH Assessment (Signed)
Patient accepted to [facility] by May, NP to Dr. Elna Breslow. The nursing report # is [phone]. The room assignment is is 504-1. Support paperwork completed and patient is ready for transfer. Patient awaiting transport via [facility].

## 2015-08-26 NOTE — ED Notes (Signed)
TTS in with pt. 
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
## 2015-08-26 NOTE — ED Notes (Signed)
Patient transported to CT 

## 2015-08-26 NOTE — Progress Notes (Signed)
The patient attended Karaoke group this evening and was appropriately behaved. She volunteered to sing one song.

## 2015-08-26 NOTE — ED Notes (Signed)
Returned from CT.
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
## 2015-08-26 NOTE — ED Notes (Signed)
MD at bedside. 

## 2015-08-26 NOTE — [facility] Group Notes (Signed)
[facility] LCSW Group Therapy  08/26/2015 3:20 PM  Type of Therapy:  Group Therapy  Participation Level:  Active  Participation Quality:  Appropriate  Affect:  Depressed  Cognitive:  Oriented  Insight:  Limited  Engagement in Therapy:  Developing/Improving  Modes of Intervention:  Discussion, Exploration, Problem-solving and Support  Today's Topic: Overcoming Obstacles. Patients identified one short term goal and potential obstacles in reaching this goal. Patients processed barriers involved in overcoming these obstacles. Patients identified steps necessary for overcoming these obstacles and explored motivation (internal and external) for facing these difficulties head on.   Patient new to unit, adjusting to group.  Sat quietly. Did resonate w other's discussion of stigma and rejection by family members, did not offer personal experience to group.     Sallee Lange 08/26/2015, 3:20 PM

## 2015-08-26 NOTE — ED Notes (Signed)
Report called to [place] in [place]

## 2015-08-26 NOTE — ED Notes (Signed)
Pt is alert, tearful, answering few questions  Daughter at bedside

## 2015-08-26 NOTE — [facility] Suicide Risk Assessment (Signed)
[facility] Admission Suicide Risk Assessment   Nursing information obtained from:    Demographic factors:    Current Mental Status:    Loss Factors:    Historical Factors:    Risk Reduction Factors:    Total Time spent with patient: 30 minutes Principal Problem: MDD (major depressive disorder), recurrent, severe, with psychosis (HCC) Diagnosis:   Patient Active Problem List   Diagnosis Date Noted  . MDD (major depressive disorder), recurrent, severe, with psychosis (HCC) [F33.3] 08/26/2015  . PTSD (post-traumatic stress disorder) [F43.10] 08/26/2015  . Alcohol use disorder, moderate, dependence (HCC) [F10.20] 08/26/2015  . Head trauma [S09.90XA] 08/26/2015  . AP (abdominal pain) [R10.9]   . Abdominal pain [R10.9] 12/27/2014  . Enteritis [K52.9] 12/27/2014  . Nausea with vomiting [R11.2] 12/27/2014  . Hyponatremia [E87.1] 12/27/2014  . Essential hypertension [I10] 12/27/2014  . Depression [F32.9] 12/27/2014  . Anemia, iron deficiency [D50.9] 12/27/2014  . Sickle cell trait (HCC) [D57.3]   . Iron deficiency anemia [D50.9]   . GI bleed [K92.2] 05/18/2014  . Abdominal pain, left upper quadrant [R10.12] 05/18/2014  . Intussusception of intestine - physiologic & self-resolved [K56.1] 05/17/2014     Continued Clinical Symptoms:  Alcohol Use Disorder Identification Test Final Score (AUDIT): 8 The "Alcohol Use Disorders Identification Test", Guidelines for Use in Primary Care, Second Edition.  World Science writer [facility]). Score between 0-7:  no or low risk or alcohol related problems. Score between 8-15:  moderate risk of alcohol related problems. Score between 16-19:  high risk of alcohol related problems. Score 20 or above:  warrants further diagnostic evaluation for alcohol dependence and treatment.   CLINICAL FACTORS:   Panic Attacks Alcohol/Substance Abuse/Dependencies Previous Psychiatric Diagnoses and Treatments    Psychiatric Specialty Exam: Physical Exam  ROS  Blood  pressure 141/93, pulse 107, temperature 98.9 F (37.2 C), temperature source Oral, resp. rate 14, height 5\' 7"  (1.702 m), last menstrual period 03/26/2015, SpO2 100 %.There is no weight on file to calculate BMI.  Please see H&P.  COGNITIVE FEATURES THAT CONTRIBUTE TO RISK:  Closed-mindedness, Polarized thinking and Thought constriction (tunnel vision)    SUICIDE RISK:   Moderate:  Frequent suicidal ideation with limited intensity, and duration, some specificity in terms of plans, no associated intent, good self-control, limited dysphoria/symptomatology, some risk factors present, and identifiable protective factors, including available and accessible social support.  PLAN OF CARE: Please see H&P.    Medical Decision Making:  Review of Psycho-Social Stressors (1), Review or order clinical lab tests (1), Review and summation of old records (2), Review of Last Therapy Session (1), Review of Medication Regimen & Side Effects (2) and Review of New Medication or Change in Dosage (2)  I certify that inpatient services furnished can reasonably be expected to improve the patient's condition.   Tauri Ethington md 08/26/2015, 11:55 AM

## 2015-08-26 NOTE — ED Notes (Signed)
MD at bedside. C-collar removed/.

## 2015-08-26 NOTE — Progress Notes (Addendum)
Pt anxious, tearful and sad. Pt nervous, paranoid and afraid to have a roommate. Pt moved to a new room. Pt EKG completed. Pt have specimen cup for UA. Writer waiting for pt to return sample.  Pt noted to be tremulous and anxious. Noted on CIWA
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
## 2015-08-27 LAB — PREGNANCY, URINE: Preg Test, Ur: NEGATIVE

## 2015-08-27 MED ORDER — TRAZODONE HCL 50 MG PO TABS
50.0000 mg | ORAL_TABLET | Freq: Every day | ORAL | Status: DC
  Administered 2015-08-27: 50 mg via ORAL
  Filled 2015-08-27 (×3): qty 1

## 2015-08-27 MED ORDER — ONDANSETRON 4 MG PO TBDP: 4.0000 mg | ORAL_TABLET | Freq: Four times a day (QID) | ORAL | Status: AC | PRN

## 2015-08-27 MED ORDER — LORAZEPAM 1 MG PO TABS
1.0000 mg | ORAL_TABLET | Freq: Four times a day (QID) | ORAL | Status: AC | PRN
  Administered 2015-08-29: 1 mg via ORAL
  Filled 2015-08-27: qty 1

## 2015-08-27 MED ORDER — SERTRALINE HCL 50 MG PO TABS
50.0000 mg | ORAL_TABLET | Freq: Every day | ORAL | Status: DC
  Administered 2015-08-28 – 2015-08-30 (×3): 50 mg via ORAL
  Filled 2015-08-27 (×4): qty 1

## 2015-08-27 MED ORDER — ADULT MULTIVITAMIN W/MINERALS CH
1.0000 | ORAL_TABLET | Freq: Every day | ORAL | Status: DC
  Administered 2015-08-27 – 2015-09-03 (×8): 1 via ORAL
  Filled 2015-08-27 (×12): qty 1

## 2015-08-27 MED ORDER — LORAZEPAM 1 MG PO TABS
1.0000 mg | ORAL_TABLET | Freq: Four times a day (QID) | ORAL | Status: AC
  Administered 2015-08-27 (×4): 1 mg via ORAL
  Filled 2015-08-27 (×4): qty 1

## 2015-08-27 MED ORDER — LORAZEPAM 1 MG PO TABS
1.0000 mg | ORAL_TABLET | Freq: Two times a day (BID) | ORAL | Status: AC
  Administered 2015-08-29 (×2): 1 mg via ORAL
  Filled 2015-08-27 (×2): qty 1

## 2015-08-27 MED ORDER — TRIAMTERENE-HCTZ 37.5-25 MG PO TABS
1.5000 | ORAL_TABLET | Freq: Every day | ORAL | Status: DC
  Administered 2015-08-27 – 2015-09-03 (×8): 1.5 via ORAL
  Filled 2015-08-27 (×6): qty 1.5
  Filled 2015-08-27: qty 2
  Filled 2015-08-27 (×4): qty 1.5

## 2015-08-27 MED ORDER — ATORVASTATIN CALCIUM 10 MG PO TABS
10.0000 mg | ORAL_TABLET | Freq: Every day | ORAL | Status: DC
  Administered 2015-08-27 – 2015-09-02 (×7): 10 mg via ORAL
  Filled 2015-08-27 (×9): qty 1

## 2015-08-27 MED ORDER — LORAZEPAM 1 MG PO TABS
1.0000 mg | ORAL_TABLET | Freq: Three times a day (TID) | ORAL | Status: AC
  Administered 2015-08-28 (×3): 1 mg via ORAL
  Filled 2015-08-27 (×3): qty 1

## 2015-08-27 MED ORDER — VITAMIN B-1 100 MG PO TABS
100.0000 mg | ORAL_TABLET | Freq: Every day | ORAL | Status: DC
  Administered 2015-08-28 – 2015-09-03 (×7): 100 mg via ORAL
  Filled 2015-08-27 (×10): qty 1

## 2015-08-27 MED ORDER — LORAZEPAM 1 MG PO TABS
1.0000 mg | ORAL_TABLET | Freq: Every day | ORAL | Status: AC
  Administered 2015-08-30: 1 mg via ORAL
  Filled 2015-08-27: qty 1

## 2015-08-27 MED ORDER — LOPERAMIDE HCL 2 MG PO CAPS: 2.0000 mg | ORAL_CAPSULE | ORAL | Status: AC | PRN

## 2015-08-27 MED ORDER — HYDROXYZINE HCL 25 MG PO TABS
25.0000 mg | ORAL_TABLET | Freq: Four times a day (QID) | ORAL | Status: AC | PRN
  Administered 2015-08-27 – 2015-08-29 (×3): 25 mg via ORAL
  Filled 2015-08-27 (×2): qty 1

## 2015-08-27 MED ORDER — AMLODIPINE BESYLATE 5 MG PO TABS
5.0000 mg | ORAL_TABLET | Freq: Every day | ORAL | Status: DC
  Administered 2015-08-27 – 2015-09-02 (×7): 5 mg via ORAL
  Filled 2015-08-27 (×9): qty 1
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
## 2015-08-27 NOTE — Progress Notes (Addendum)
Patient ID: Julia Wheeler, female   DOB: [birth year], 49 y.o.   MRN: [id number] D   ---  Pt. B/P shows elevated diastolic  Pressure of over  " 297 ".  Pt. Shows no sign of distress .  She is encouraged to reduce physical activity.  Staff with re-take B/P after pt. Returns from cafeteria from lunch. -----   Pts. B/P retaken after group at 1445 hrs and showed good improvement both sitting and standing   ( see Flow Sheet )   --- A --  Monitor pt. --- R --  Pt. Remains safe

## 2015-08-27 NOTE — Progress Notes (Signed)
Patient ID: Julia Wheeler, female   DOB: 1966-07-29, 48 y.o.   MRN: [id number] D: Client has visit with BF and daughter this evening, reports visit went well. Client reports depression and anxiety as "4" of 10. "feel way better" reports groups helpful "just the general conversation" Client appeared puzzled during our conversation when asked if she was hearing voices "no, well it's just that I allowed the drinking to get ahead of me, I didn't realize it was a problem, but it was" "I would like to get a good night's sleep, my REM sleep" A: Writer provided emotional support, reviewed medication, encouraged client to report symptoms of withdrawal i.e. Anxiety, agitation, sweating, tremors. Staff will monitor q31min for safety. R: Client is safe on the unit, attended group.

## 2015-08-27 NOTE — Progress Notes (Signed)
D  --  Pt. States no pain at this time but does say she is highly anxious.  She agrees to seek staff as needed and contracts for safety.  Pt. Is quiet, pleasant and stays to herself in her room most of time.  She maintains a neat, clean appearance and in polite to talk with,  But has a sad. Depressed affect.   Pt. Complained of being dizzy this AM due to head injury (hematoma) to back of head prior to admission.  Pt. Was receptive to advise to make slow, easy movements and to keep Nurse advised .  =--- A ---  Support, safety cks and meds provided.  --- R --  Pt. Remain safe on unit

## 2015-08-27 NOTE — [facility] Group Notes (Signed)
[facility] LCSW Aftercare Discharge Planning Group Note   08/27/2015 3:41 PM  Participation Quality:  Attentive  Mood/Affect:  Depressed  Depression Rating:  6  Anxiety Rating:  5 -6  Thoughts of Suicide:  No,"wouldnt hurt my daughter like that"  Will you contract for safety?   NA  Current AVH:  No  Plan for Discharge/Comments:  Return home, follow up w Vesta Mixer who is current provider, will be offered TCT  Transportation Means: boyfriend  Supports:  Family members and boyfriend  Sallee Lange

## 2015-08-27 NOTE — [facility] Group Notes (Signed)
[facility] LCSW Group Therapy  08/27/2015 1:48 PM   Type of Therapy:  Group Therapy  Participation Level:  Limited  Participation Quality:  Quiet, attentive  Affect:  Oriented  Cognitive:  Appropriate  Insight:  Developing/Improving  Engagement in Therapy:  Observer  Modes of Intervention:  Discussion, Exploration, Socialization and Support  Summary of Progress/Problems:  Chaplain led group explored concept of hope and its relevance to mental health recovery.  Patients explored themes including what matters to them personally, how others responses are similar/different, and what they are hopeful for.  Group members discussed relevance of social supports, innter strength and using their own stories to craft a recovery path.  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  Described journey from social drinker to alcoholic, and realizing that drinking enabled her to forget problems.  Expressed hope that she can provide better role model for daughter.    Sallee Lange

## 2015-08-27 NOTE — Plan of Care (Signed)
Problem: Alteration in mood Goal: STG-Patient reports thoughts of self-harm to staff Outcome: Progressing Client is safe on the unit, AEB q80min safety checks, currently denies self harm thoughts.

## 2015-08-27 NOTE — Progress Notes (Signed)
[facility] Group Notes:  (Nursing/MHT/Case Management/Adjunct)  Date:  08/27/2015  Time:  9:05 PM  Type of Therapy:  Psychoeducational Skills  Participation Level:  Active  Participation Quality:  Appropriate  Affect:  Appropriate  Cognitive:  Appropriate  Insight:  Good  Engagement in Group:  Engaged  Modes of Intervention:  Education  Summary of Progress/Problems: The patient verbalized in group that she had a good day overall. First of all, she states that she felt better as a whole. Secondly, she indicated that she enjoyed going outside for fresh air. Finally, she mentioned that she has been thinking about returning to [facility] for the support groups. She verbalized in group that her boyfriend drinks alcohol and that she drinks out of a "habit". She understands that it is dangerous to mix alcohol with her medication. She is open to the idea of attending support groups at [facility] or possibly attend Alcoholics Anonymous meetings.   [name] S 08/27/2015, 9:05 PM

## 2015-08-27 NOTE — Plan of Care (Signed)
Problem: Diagnosis: Increased Risk For Suicide Attempt Goal: STG-Patient Will Comply With Medication Regime Outcome: Progressing Client is compliant with medications regime AEB taking medications with no reports of side effects.

## 2015-08-28 MED ORDER — TRAZODONE HCL 100 MG PO TABS
100.0000 mg | ORAL_TABLET | Freq: Every day | ORAL | Status: DC
  Administered 2015-08-28 – 2015-08-29 (×2): 100 mg via ORAL
  Filled 2015-08-28 (×3): qty 1

## 2015-08-28 NOTE — Progress Notes (Signed)
Patient ID: Julia Wheeler, female   DOB: 07-01-1966, 49 y.o.   MRN: [id number] D: Client in room this evening, appears sad, subdued, and lethargic. Client reports "looking for my BF and daughter to come" Client reports depression "4" of 10. A:Writer encouraged group tonight, reviewed medications, administered as ordered. Client encouraged to report effectiveness or side effects. Staff will monitor q72min for safety. R:Client is safe on the unit, did not attend group.
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
## 2015-08-28 NOTE — [facility] Group Notes (Signed)
[facility] Group Notes:  (Clinical Social Work)  08/28/2015  11:15-12:00PM  Summary of Progress/Problems:   The main focus of today's process group was to discuss patients' feelings related to being hospitalized.  It was agreed in general by the group that it would be preferable to avoid future hospitalizations, and we discussed means of doing that.  As a follow-up, problems with adhering to medication recommendations were discussed.  The patient expressed her primary feeling about being hospitalized is okay but she was initially confused because she did not think she had a problem.  She was asked questions at various times throughout the group, as was every participant, and had a flattened affect and poverty of speech each time.  Type of Therapy:  Group Therapy - Process  Participation Level:  Active  Participation Quality:  Attentive  Affect:  Flat  Cognitive:  Confused  Insight:  Limited  Engagement in Therapy:  Limited  Modes of Intervention:  Exploration, Discussion  Ambrose Mantle, LCSW 08/28/2015, 12:35 PM

## 2015-08-28 NOTE — Progress Notes (Signed)
Patient ID: Julia Wheeler, female   DOB: 1966/04/12, 49 y.o.   MRN: [id number]   D: Pt has been very flat and depressed on the unit today. Pt has also been very isolative, and very guarded on the unit. Pt reported that her depression was a 4, her hopelessness was a 0, and her anxiety was a 4. Pt reported that her goal was to work on skills that she could use at home to avoid drinking. Pt reported being negative SI/HI, no AH/VH noted. A: 15 min checks continued for patient safety. R: Pt safety maintained.

## 2015-08-28 NOTE — Progress Notes (Signed)
The focus of this group is to help patients review their daily goal of treatment and discuss progress on daily workbooks. Pt did not attend the evening group. 

## 2015-08-29 NOTE — Progress Notes (Signed)
Did  Not attend group 

## 2015-08-29 NOTE — Progress Notes (Signed)
D: Pt denies SI/HI/AVH. Pt is pleasant and cooperative. Pt stayed in room majority of the evening, pt came out to use the phone, but pt not interacting much with peers or staff.   A: Pt was offered support and encouragement. Pt was given scheduled medications. Pt was encourage to attend groups. Q 15 minute checks were done for safety.  R:. Pt is taking medication. Pt has no complaints.Pt receptive to treatment and safety maintained on unit.

## 2015-08-29 NOTE — [facility] Group Notes (Addendum)
[facility] Group Notes:  (Nursing/MHT/Case Management/Adjunct)  Date:  08/29/2015  Time:  1:24 PM  Type of Therapy:  Psychoeducational Skills  Participation Level:  Active  Participation Quality:  Appropriate  Affect:  Appropriate  Cognitive:  Appropriate  Insight:  Appropriate  Engagement in Group:  Engaged  Modes of Intervention:  Discussion  Summary of Progress/Problems: Pt did attend self inventory group, pt reported that she was negative SI/HI, no AH/VH noted. Pt rated her depression as a 4, her anxiety 6 and her helplessness/hopelessness as a 0.     Pt reported no issues or concerns.   Jacquelyne Balint Shanta 08/29/2015, 1:24 PM

## 2015-08-29 NOTE — Plan of Care (Signed)
Problem: Alteration in mood & ability to function due to Goal: LTG-Pt reports reduction in suicidal thoughts (Patient reports reduction in suicidal thoughts and is able to verbalize a safety plan for whenever patient is feeling suicidal)  Outcome: Progressing Pt denies SI at this time     

## 2015-08-29 NOTE — [facility] Group Notes (Signed)
[facility] Group Notes:  (Clinical Social Work)  08/29/2015  1:15-2:15PM  Summary of Progress/Problems:   The main focus of today's process group was to   1)  discuss the importance of adding supports  2)  define health supports versus unhealthy supports  3)  identify the patient's current unhealthy supports and plan how to handle them  4)  Identify the patient's current healthy supports and plan what to add.  An emphasis was placed on using counselor, doctor, therapy groups, 12-step groups, and problem-specific support groups to expand supports.    The patient expressed full comprehension of the concepts presented, and agreed that there is a need to add more supports.  The patient stated her anxiety was 6 at the beginning of group and at the end of group said it was a 4, felt that the music had helped to lower her anxiety.  Type of Therapy:  Process Group with Motivational Interviewing  Participation Level:  Active  Participation Quality:  Appropriate  Affect:  Anxious, Depressed and Flat  Cognitive:  Alert  Insight:  Developing/Improving  Engagement in Therapy:  Engaged  Modes of Intervention:   Education, Support and Processing, Activity  Ambrose Mantle, LCSW 08/29/2015
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
## 2015-08-29 NOTE — Progress Notes (Signed)
Patient ID: Julia Wheeler, female   DOB: [birth year], 49 y.o.   MRN: [id number]   D: Pt has been very flat and depressed on the unit today. Pt has also been very isolative, and very guarded on the unit. Pt reported that her depression was a 4, her hopelessness was a 0, and her anxiety was a 4. Pt reported that her goal was to work on skills for going home. Pt reported being negative SI/HI, no AH/VH noted. A: 15 min checks continued for patient safety. R: Pt safety maintained.

## 2015-08-30 MED ORDER — SERTRALINE HCL 100 MG PO TABS
100.0000 mg | ORAL_TABLET | Freq: Every day | ORAL | Status: DC
  Administered 2015-08-31 – 2015-09-03 (×4): 100 mg via ORAL
  Filled 2015-08-30 (×6): qty 1

## 2015-08-30 MED ORDER — TRAZODONE HCL 150 MG PO TABS
150.0000 mg | ORAL_TABLET | Freq: Every day | ORAL | Status: DC
  Administered 2015-08-30 – 2015-09-01 (×3): 150 mg via ORAL
  Filled 2015-08-30 (×4): qty 1

## 2015-08-30 NOTE — Plan of Care (Signed)
Problem: Diagnosis: Increased Risk For Suicide Attempt Goal: LTG-Patient Will Show Positive Response to Medication LTG (by discharge) : Patient will show positive response to medication and will participate in the development of the discharge plan.  Outcome: Progressing Pt denies withdrawal symptoms Goal: LTG-Patient Will Report Improved Mood and Deny Suicidal LTG (by discharge) Patient will report improved mood and deny suicidal ideation.  Outcome: Progressing Pt denies si thoughts
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
## 2015-08-30 NOTE — Progress Notes (Signed)
D:Pt has a flat/sad affect. She denies withdrawal symptoms and rates her anxiety and depression as a 6 on 0-10 scale with 10 being the most. Pt reports did not realize how much she had been drinking until her daughter found her passed out and called for help. A:Offered support, encouragement and 15 minute checks. R:Pt denies si and hi. Safety maintained on the unit.

## 2015-08-30 NOTE — [facility] Group Notes (Signed)
[facility] LCSW Group Therapy 08/30/2015  1:15 pm  Type of Therapy: Group Therapy Participation Level: Active  Participation Quality: Attentive, Sharing and Supportive  Affect: Depressed and Flat  Cognitive: Alert and Oriented  Insight: Developing/Improving and Engaged  Engagement in Therapy: Developing/Improving and Engaged  Modes of Intervention: Clarification, Confrontation, Discussion, Education, Exploration,  Limit-setting, Orientation, Problem-solving, Rapport Building, Dance movement psychotherapist, Socialization and Support  Summary of Progress/Problems: Pt identified obstacles faced currently and processed barriers involved in overcoming these obstacles. Pt identified steps necessary for overcoming these obstacles and explored motivation (internal and external) for facing these difficulties head on. Pt further identified one area of concern in their lives and chose a goal to focus on for today. Patient discussed wanting to go back in time to change certain "footprints" that she has left in her children's lives. She discussed feeling as though even minimal things are a "weight" for her now. CSW and other group members provided patient with emotional support and encouragement.  Samuella Bruin, MSW, [employer] Clinical Social Worker [facility] [phone]

## 2015-08-30 NOTE — [facility] Group Notes (Signed)
   [facility] LCSW Aftercare Discharge Planning Group Note  08/30/2015  8:45 AM   Participation Quality: Alert, Appropriate and Oriented  Mood/Affect: Depressed and Flat  Depression Rating: 5-6  Anxiety Rating: 6  Thoughts of Suicide: Pt denies SI/HI  Will you contract for safety? Yes  Current AVH: Pt denies  Plan for Discharge/Comments: Pt attended discharge planning group and actively participated in group. CSW provided pt with today's workbook. Patient reports higher levels of depression and anxiety this morning but does not verbalize why. She reports that she slept fair last night. Patient plans to return home to follow up with outpatient services at discharge.   Transportation Means: Pt reports access to transportation  Supports: No supports mentioned at this time  Samuella Bruin, MSW, [employer] Clinical Social Worker [employer] [phone]

## 2015-08-31 LAB — LIPID PANEL
CHOL/HDL RATIO: 2.3 ratio
Cholesterol: 196 mg/dL (ref 0–200)
HDL: 86 mg/dL (ref >40)
LDL CALC: 85 mg/dL (ref 0–99)
Triglycerides: 127 mg/dL (ref <150)
VLDL: 25 mg/dL (ref 0–40)

## 2015-08-31 LAB — TSH: TSH: 0.333 u[IU]/mL — ABNORMAL LOW (ref 0.350–4.500)

## 2015-08-31 LAB — T4, FREE: Free T4: 0.64 ng/dL (ref 0.61–1.12)

## 2015-08-31 MED ORDER — ENSURE ENLIVE PO LIQD
237.0000 mL | Freq: Three times a day (TID) | ORAL | Status: DC
  Administered 2015-08-31 – 2015-09-03 (×6): 237 mL via ORAL

## 2015-08-31 MED ORDER — LORATADINE 10 MG PO TABS
10.0000 mg | ORAL_TABLET | Freq: Every day | ORAL | Status: DC
  Administered 2015-08-31 – 2015-09-02 (×3): 10 mg via ORAL
  Filled 2015-08-31 (×6): qty 1

## 2015-08-31 NOTE — Progress Notes (Signed)
D   Pt appears depressed and sad   She also endorses depression and anxiety   Pt complained of a stopped up feeling in her ears and requested and antihistamine   She attended and participated in group appropriately A   Verbal support given    Medications administered and effectiveness monitored    Q 15 min checks  R   Pt safe and receptive to verbal support

## 2015-08-31 NOTE — Progress Notes (Signed)
Patient ID: Julia Wheeler, female   DOB: Jan 28, 1966, 49 y.o.   MRN: [id number]  Adult Psychoeducational Group Note  Date:  08/31/2015 Time: 09:35am  Group Topic/Focus:  Recovery Goals:   The focus of this group is to identify appropriate goals for recovery and establish a plan to achieve them.  Participation Level:  Active  Participation Quality:  Appropriate  Affect:  Appropriate  Cognitive:  Alert and Oriented  Insight: Improving  Engagement in Group:  Engaged  Modes of Intervention:  Activity, Education and Support  Additional Comments:  Pt participated in group, able to identify one daily goal to accomplish today.  Aurora Mask 08/31/2015, 10:51 AM

## 2015-08-31 NOTE — Progress Notes (Signed)
Patient ID: Julia Wheeler, female   DOB: 1965/11/25, 49 y.o.   MRN: [id number]   Pt currently presents with a flat, suspicious affect and depressed behavior. Per self inventory, pt rates depression at a 4, hopelessness 0 and anxiety 4. Pt's daily goal is to "my emotions" and they intend to do so by "absorb what's being said." Pt reports fair sleep, a fair appetite, low energy and good concentration. Pt reports tearfully to writer that "I'm worried about going home to live with my boyfriend, he drinks alcohol. I don't want to drink anymore but I'm worried I'll get back into it."  Pt provided with medications er providers orders. Pt's labs and vitals were monitored throughout the day. Pt supported emotionally and encouraged to express concerns and questions. Pt educated on medications and substance abuse. Pt encouraged to speak with social worker but AA groups and to have a designated contact/support person assigned post discharge if cravings occur.   Pt's safety ensured with 15 minute and environmental checks. Pt currently denies SI/HI and A/V hallucinations. Pt verbally agrees to seek staff if SI/HI or A/VH occurs and to consult with staff before acting on these thoughts. Will continue POC.
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
## 2015-08-31 NOTE — Progress Notes (Signed)
D    Pt is pleasant on approach and cooperative   She requested to sleep in the quiet room as her new roommate makes her uncomfortable   She informed staff she would be discharged tomorrow and is looking forward to same   Discussed follow information and AA meetings for Alcholics and families    A   Verbal support given    Medications administered and effectiveness monitored    Q 15 min checks   Allowed pt to sleep in the quiet room R    Pt safe at present and was receptive to verbal support

## 2015-08-31 NOTE — [facility] Group Notes (Signed)
[facility] LCSW Group Therapy  08/31/2015   1:15 PM   Type of Therapy:  Group Therapy  Participation Level:  Active  Participation Quality:  Attentive, Sharing and Supportive  Affect:  Depressed and Flat  Cognitive:  Alert and Oriented  Insight:  Developing/Improving and Engaged  Engagement in Therapy:  Developing/Improving and Engaged  Modes of Intervention:  Clarification, Confrontation, Discussion, Education, Exploration, Limit-setting, Orientation, Problem-solving, Rapport Building, Dance movement psychotherapist, Socialization and Support  Summary of Progress/Problems: The topic for group therapy was feelings about diagnosis.  Pt actively participated in group discussion on their past and current diagnosis and how they feel towards this.  Pt also identified how society and family members judge them, based on their diagnosis as well as stereotypes and stigmas. Patient discussed being able to talk to her family about her diagnosis but feels that they don't understand. CSW and other group members provided patient with emotional support and encouragement.  Samuella Bruin, MSW, [employer] Clinical Social Worker [facility] [phone]

## 2015-09-01 LAB — PROLACTIN: PROLACTIN: 21.9 ng/mL (ref 4.8–23.3)

## 2015-09-01 LAB — HEMOGLOBIN A1C
Hgb A1c MFr Bld: 5.6 % (ref 4.8–5.6)
MEAN PLASMA GLUCOSE: 114 mg/dL

## 2015-09-01 NOTE — [facility] Group Notes (Signed)
[facility] LCSW Aftercare Discharge Planning Group Note   09/01/2015 11:15 AM  Participation Quality:  Active  Mood/Affect:  Appropriate  Depression Rating:  4  Anxiety Rating:  4  Thoughts of Suicide:  No Will you contract for safety?   NA  Current AVH:  Yes  Plan for Discharge/Comments:  Pt was alert and pleasant in group. Pt seemed to be nervous as evidenced by constant fidgeting and foot shaking. However, when asked about it pt said "I'm always like this...this is everyday". Pt goes to a support group through [place] and is interested in continuing those services. Towards the end of group pt reported that she has been seeing faces and wanted to know "Is that a problem?".   Transportation Means:   Supports:  Jonathon Jordan
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
## 2015-09-01 NOTE — [facility] Group Notes (Signed)
[facility] LCSW Group Therapy  09/01/2015 1:40 PM  Type of Therapy: Group Therapy  Participation Level: Active  Participation Quality: Attentive  Affect: Flat  Cognitive: Oriented  Insight: Limited  Engagement in Therapy: Engaged  Modes of Intervention: Discussion and Socialization  Summary of Progress/Problems: Onalee Hua from [employer] was here to tell his story of recovery and play his guitar. Pt was alert and pleasant throughout the group. Pt asked the speaker questions how to become a peer support specialist and seemed very interested in it.  Vito Backers. Beverely Pace 09/01/2015 1:40 PM

## 2015-09-01 NOTE — Progress Notes (Signed)
D    Pt is pleasant on approach and cooperative   She complains of acid reflux    Pt thinking seems to be a little disorganized  A   Verbal support given    Medications administered and effectiveness monitored    Q 15 min checks R    Pt safe at present and was receptive to verbal support

## 2015-09-01 NOTE — Progress Notes (Signed)
Adult Psychoeducational Group Note  Date:  09/01/2015 Time:  9:36 PM  Group Topic/Focus:  Wrap-Up Group:   The focus of this group is to help patients review their daily goal of treatment and discuss progress on daily workbooks.  Participation Level:  Active  Participation Quality:  Appropriate  Affect:  Appropriate  Cognitive:  Alert  Insight: Appropriate  Engagement in Group:  Engaged  Modes of Intervention:  Discussion  Additional Comments:  On a scale from 1-10 (1 being the worst, 10 being the best), patient rated her day as a 6. Patient stated having a fair day. Patient stated she's starting to socialize more with others.   Julia Wheeler L Connie Lasater 09/01/2015, 9:36 PM

## 2015-09-01 NOTE — Progress Notes (Signed)
D: Pt presents with worry and anxiety this shift. Pt denies SI/HI. Denies AVH. Pt reports she did not sleep well last night. Pt moved to  Room without roommate. Tearful at times during conversation. Pt denies s/s of withdrawal.   A:Special checks q 15 mins in place for safety. Medication administered per MD order (see eMAR) Encouragement and support provided. CIWA protocol in place.  R:Safety mantained. Compliant with medication regimen. Will continue to monitor.

## 2015-09-02 LAB — T3: T3, Total: 86 ng/dL (ref 71–180)

## 2015-09-02 MED ORDER — DOXEPIN HCL 10 MG PO CAPS
10.0000 mg | ORAL_CAPSULE | Freq: Every day | ORAL | Status: DC
  Administered 2015-09-02: 10 mg via ORAL
  Filled 2015-09-02 (×2): qty 1

## 2015-09-02 NOTE — Progress Notes (Signed)
Recreation Therapy Notes  10.20.2016 @ approximately 2:30pm. Per MD order LRT met with patient to introduce and educate patient about stress management techniques she can use inpatient and post d/c. LRT introduced patient to diaphragmatic breathing and progressive muscle relaxation. Patient responded well to diaphragmatic breathing, demonstrating ability to practice independently. Patient repored this technique to be helpful. Patient participated in progressive muscle relaxation, but was not able to fully commit to technique. Patient began to demonstrate signs of anxiety, bouncing foot up and down. LRT chose to stop full body progressive muscle relaxation at this time, but did educate patient on benefit of making and releasing fist and making and releasing toe fists. Patient advised to use techniques when feeling most stress, by patient report this would be in the cafeteria. LRT encouraged patient to use technique dinner tonight and breakfast in the morning. LRT to follow up 10.21.2016.  Laureen Ochs Marwin Primmer, LRT/CTRS  Lane Hacker 09/02/2015 3:38 PM
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
## 2015-09-02 NOTE — [facility] Group Notes (Signed)
Pt attended karaoke group and sang a song.  Pt also socialized with her peers.  Caroll Rancher, MHT

## 2015-09-02 NOTE — Progress Notes (Signed)
D: Per patient self inventory form pt reports she slept poor last night with the use of sleep medication. She reports a fair appetite, low energy level, poor concentration. She rates depression 6/10, hopelessness 0/10, anxiety 6/10- all on 0-10 scale, 10 being the worse. Pt denies s/s of withdrawal. She denies physical pain. Pt reports her goal for the day is "getting my sleep medication and night terror medicines to a level that they work." Pt report she will "speak to Dr. Elna Wheeler to see what can be done." Pt attended nursing group on the unit. Increased anxiety noted. Slow to respond on approach. Disconnected during conversation.  A:special checks q 15 mins in place for safety. Medication administered per MD order (see eMAR) Encouragement and support provided. Dr. Elna Wheeler notified of pt concerns in treatment team.  R:Safety maintained. Compliant with medication regimen. Will continue to monitor.

## 2015-09-02 NOTE — [facility] Group Notes (Signed)
[facility] LCSW Group Therapy  09/02/2015 1:15 pm  Type of Therapy: Process Group Therapy  Participation Level:  Active  Participation Quality:  Appropriate  Affect:  Flat  Cognitive:  Oriented  Insight:  Improving  Engagement in Group:  Limited  Engagement in Therapy:  Limited  Modes of Intervention:  Activity, Clarification, Education, Problem-solving and Support  Summary of Progress/Problems: Today's group addressed the issue of overcoming obstacles.  Patients were asked to identify their biggest obstacle post d/c that stands in the way of their on-going success, and then problem solve as to how to manage this. Difficult time initially coming up with an answer.  "I guess this is hard because I am still trying to understand my condition.  Although I have been diagnosed for a year, it was probably going on for a year before that.  I need to continue to identify strategies for dealing with anxiety related to symptoms."  Identifed her outpatient therapist and group as supports as she does this.  Also described how she drinks when she becomes overwhelmed with symptoms, resulting in losing control and ending up in hospital.  Struggled with expressng herself accurately, and was frustrated about that, but persisted.  Daryel Gerald B 09/02/2015   1:35 PM

## 2015-09-02 NOTE — [facility] Group Notes (Signed)
[facility] Group Notes:  (Nursing/MHT/Case Management/Adjunct)  Date:  09/02/2015  Time: 0930 Type of Therapy:  Nurse Education  Participation Level:  Minimal  Participation Quality:  Resistant  Affect:  Anxious  Cognitive:  Alert  Insight:  Improving  Engagement in Group:  Lacking  Modes of Intervention:  Activity, Discussion, Socialization and Support  Summary of Progress/Problems:  Julia Wheeler 09/02/2015, 1:28 PM

## 2015-09-03 MED ORDER — OLANZAPINE 2.5 MG PO TABS: 2.5000 mg | ORAL_TABLET | Freq: Every day | ORAL | Status: DC

## 2015-09-03 MED ORDER — LORATADINE 10 MG PO TABS: 10.0000 mg | ORAL_TABLET | Freq: Every day | ORAL | Status: DC

## 2015-09-03 MED ORDER — ATORVASTATIN CALCIUM 10 MG PO TABS: 10.0000 mg | ORAL_TABLET | Freq: Every day | ORAL | Status: DC

## 2015-09-03 MED ORDER — SERTRALINE HCL 100 MG PO TABS: 100.0000 mg | ORAL_TABLET | Freq: Every day | ORAL | Status: DC

## 2015-09-03 MED ORDER — HYDROXYZINE HCL 25 MG PO TABS: 25.0000 mg | ORAL_TABLET | ORAL | Status: DC | PRN

## 2015-09-03 MED ORDER — DOXEPIN HCL 10 MG PO CAPS
20.0000 mg | ORAL_CAPSULE | Freq: Every day | ORAL | Status: DC
  Filled 2015-09-03 (×2): qty 2

## 2015-09-03 MED ORDER — TRIAMTERENE-HCTZ 37.5-25 MG PO TABS: 1.5000 | ORAL_TABLET | Freq: Every day | ORAL | Status: DC

## 2015-09-03 MED ORDER — DOXEPIN HCL 10 MG PO CAPS: 20.0000 mg | ORAL_CAPSULE | Freq: Every day | ORAL | Status: DC

## 2015-09-03 NOTE — Progress Notes (Signed)
  D: When asked about her day pt stated, "feeling much better. Sometimes get that panicky, anxiety".  Pt also asked the writer why she was assigned to be on the 500 hall. Stated that she was listening to some of her peers and was told that all halls are not locked. Pt was concerned that something in her history had her put on the acute hall. Writer informed pt that she was not apart of the decision making process that assigned pt.  Stated, it could have been any number of reasons not limited to  Her history, and bed availability. Pt voiced no other concerns or questions.   A:  Support and encouragement was offered. 15 min checks continued for safety.  R: Pt remains safe.

## 2015-09-03 NOTE — Progress Notes (Signed)
  Julia Wheeler Adult Case Management Discharge Plan :  Will you be returning to the same living situation after discharge:  Yes,  home At discharge, do you have transportation home?: Yes,  family Do you have the ability to pay for your medications: Yes,  MCD  Release of information consent forms completed and in the chart;  Patient's signature needed at discharge.  Patient to Follow up at: Follow-up Information    Follow up with [facility] On 09/07/2015.   Why:  Patient current in services w this provider.  Appointment on Tuesday at 10:00 AM with Dr Julia Wheeler.  Then on thursday at 10:30 with your therapist Julia Braun.  Has Transitional Care Team at discharge   Contact information:   [address] Julia Wheeler [place]  [address] Phone:  [phone] Fax:  [phone]      Patient denies SI/HI: Yes,  yes    Safety Planning and Suicide Prevention discussed: Yes,  yes  Have you used any form of tobacco in the last 30 days? (Cigarettes, Smokeless Tobacco, Cigars, and/or Pipes): No  Has patient been referred to the Quitline?: N/A patient is not a smoker  Julia Wheeler, Julia Wheeler 09/03/2015, 11:11 AM
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
## 2015-09-03 NOTE — Progress Notes (Signed)
Recreation Therapy Notes  10.21.2016 @ approximately 9:15am, LRT met with patient to follow up on stress management techniques introduced in previous 1:1. Patient reported she used both techniques at dinner and breakfast this morning and that they were helpful. Patient acknowledged it is simply a task of focusing her attention. LRT praised patient for recognition and encouraged patient to use techniques post d/c. Patient agreeable.   Laureen Ochs Lossie Kalp, LRT/CTRS  Jimie Kuwahara L 09/03/2015 10:52 AM

## 2015-09-03 NOTE — Progress Notes (Signed)
Patient discharged home with prescriptions. Patient was stable and appreciative at that time. All papers and prescriptions were given and valuables returned. Maintained on routine safety checks until discharged.  Verbal understanding expressed. Denies SI/HI and A/VH. Patient given opportunity to express concerns and ask questions.  

## 2015-12-15 ENCOUNTER — Other Ambulatory Visit: Payer: Self-pay | Admitting: Nurse Practitioner

## 2015-12-15 ENCOUNTER — Ambulatory Visit (HOSPITAL_COMMUNITY)
Admission: RE | Admit: 2015-12-15 | Discharge: 2015-12-15 | Disposition: A | Discharge status: Home / Self Care | Payer: Medicaid Other | Source: Ambulatory Visit | Attending: Nurse Practitioner | Admitting: Nurse Practitioner

## 2015-12-15 DIAGNOSIS — R609 Edema, unspecified: Secondary | ICD-10-CM

## 2015-12-15 DIAGNOSIS — M79605 Pain in left leg: Secondary | ICD-10-CM | POA: Diagnosis not present

## 2015-12-15 DIAGNOSIS — M79604 Pain in right leg: Secondary | ICD-10-CM | POA: Insufficient documentation

## 2015-12-15 NOTE — Progress Notes (Signed)
VASCULAR LAB PRELIMINARY  PRELIMINARY  PRELIMINARY  PRELIMINARY  Bilateral lower extremity venous duplex completed.    Preliminary report:  Bilateral:  No evidence of DVT, superficial thrombosis, or Baker's Cyst.   [name], RVS 12/15/2015, 6:42 PM

## 2016-01-25 ENCOUNTER — Ambulatory Visit (INDEPENDENT_AMBULATORY_CARE_PROVIDER_SITE_OTHER): Payer: Medicaid Other | Admitting: Cardiology

## 2016-01-25 ENCOUNTER — Encounter: Payer: Self-pay | Admitting: Cardiology

## 2016-01-25 VITALS — BP 112/78 | HR 85 | Ht 68.0 in | Wt 193.4 lb

## 2016-01-25 DIAGNOSIS — I10 Essential (primary) hypertension: Secondary | ICD-10-CM

## 2016-01-25 DIAGNOSIS — R600 Localized edema: Secondary | ICD-10-CM | POA: Diagnosis not present

## 2016-01-25 NOTE — Addendum Note (Signed)
Addended by: Baird Lyons on: 01/25/2016 11:09 AM   Modules accepted: Orders
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
## 2016-01-25 NOTE — Patient Instructions (Addendum)
Medication Instructions:  Your physician recommends that you continue on your current medications as directed. Please refer to the Current Medication list given to you today.  Labwork: None ordered  Testing/Procedures: Your physician has requested that you have an echocardiogram. Echocardiography is a painless test that uses sound waves to create images of your heart. It provides your doctor with information about the size and shape of your heart and how well your heart's chambers and valves are working. This procedure takes approximately one hour. There are no restrictions for this procedure.  Follow-Up: No follow up is needed at this time with Dr. Elberta Fortis.  He will see you on an as needed basis.  If you need a refill on your cardiac medications before your next appointment, please call your pharmacy.  Thank you for choosing [facility]!!

## 2016-02-09 ENCOUNTER — Other Ambulatory Visit (HOSPITAL_COMMUNITY): Payer: Self-pay

## 2016-02-10 ENCOUNTER — Other Ambulatory Visit: Payer: Self-pay

## 2016-02-10 ENCOUNTER — Ambulatory Visit (HOSPITAL_COMMUNITY): Payer: Medicaid Other | Attending: Cardiovascular Disease

## 2016-02-10 DIAGNOSIS — R600 Localized edema: Secondary | ICD-10-CM | POA: Diagnosis not present

## 2016-02-10 DIAGNOSIS — D649 Anemia, unspecified: Secondary | ICD-10-CM | POA: Insufficient documentation

## 2016-02-10 DIAGNOSIS — I10 Essential (primary) hypertension: Secondary | ICD-10-CM | POA: Diagnosis not present

## 2016-02-21 ENCOUNTER — Other Ambulatory Visit: Payer: Self-pay | Admitting: *Deleted

## 2016-02-21 ENCOUNTER — Telehealth: Payer: Self-pay | Admitting: Cardiology

## 2016-02-21 MED ORDER — CARVEDILOL 6.25 MG PO TABS: 6.2500 mg | ORAL_TABLET | Freq: Two times a day (BID) | ORAL | Status: DC

## 2016-02-21 MED ORDER — LISINOPRIL 10 MG PO TABS: 10.0000 mg | ORAL_TABLET | Freq: Every day | ORAL | Status: DC

## 2016-02-21 NOTE — Telephone Encounter (Signed)
New Message:    Pt would like her echo results from 02-10-16 please.

## 2016-02-21 NOTE — Telephone Encounter (Signed)
PT AWARE OF ECHO RESULTS./CY 

## 2016-02-21 NOTE — Telephone Encounter (Signed)
UNABLE  TO LEAVE MESSAGE  VOICE MAIL BOX  NOT  SET UP WILL TRY LATER .Zack Seal

## 2016-02-25 ENCOUNTER — Emergency Department (HOSPITAL_COMMUNITY)
Admission: EM | Admit: 2016-02-25 | Discharge: 2016-02-25 | Disposition: A | Discharge status: Home / Self Care | Payer: Medicaid Other | Attending: Emergency Medicine | Admitting: Emergency Medicine

## 2016-02-25 ENCOUNTER — Encounter (HOSPITAL_COMMUNITY): Payer: Self-pay | Admitting: Family Medicine

## 2016-02-25 DIAGNOSIS — Z79899 Other long term (current) drug therapy: Secondary | ICD-10-CM | POA: Insufficient documentation

## 2016-02-25 DIAGNOSIS — Z8701 Personal history of pneumonia (recurrent): Secondary | ICD-10-CM | POA: Diagnosis not present

## 2016-02-25 DIAGNOSIS — F4310 Post-traumatic stress disorder, unspecified: Secondary | ICD-10-CM | POA: Insufficient documentation

## 2016-02-25 DIAGNOSIS — Z8739 Personal history of other diseases of the musculoskeletal system and connective tissue: Secondary | ICD-10-CM | POA: Insufficient documentation

## 2016-02-25 DIAGNOSIS — G43909 Migraine, unspecified, not intractable, without status migrainosus: Secondary | ICD-10-CM | POA: Diagnosis not present

## 2016-02-25 DIAGNOSIS — I10 Essential (primary) hypertension: Secondary | ICD-10-CM | POA: Diagnosis not present

## 2016-02-25 DIAGNOSIS — Z862 Personal history of diseases of the blood and blood-forming organs and certain disorders involving the immune mechanism: Secondary | ICD-10-CM | POA: Insufficient documentation

## 2016-02-25 DIAGNOSIS — Z8669 Personal history of other diseases of the nervous system and sense organs: Secondary | ICD-10-CM | POA: Diagnosis not present

## 2016-02-25 DIAGNOSIS — R220 Localized swelling, mass and lump, head: Secondary | ICD-10-CM | POA: Diagnosis not present

## 2016-02-25 DIAGNOSIS — T464X5A Adverse effect of angiotensin-converting-enzyme inhibitors, initial encounter: Secondary | ICD-10-CM | POA: Diagnosis not present

## 2016-02-25 MED ORDER — SODIUM CHLORIDE 0.9 % IV BOLUS (SEPSIS)
500.0000 mL | Freq: Once | INTRAVENOUS | Status: AC
  Administered 2016-02-25: 500 mL via INTRAVENOUS

## 2016-02-25 MED ORDER — DIPHENHYDRAMINE HCL 50 MG/ML IJ SOLN
25.0000 mg | Freq: Once | INTRAMUSCULAR | Status: AC
  Administered 2016-02-25: 25 mg via INTRAVENOUS
  Filled 2016-02-25: qty 1

## 2016-02-25 MED ORDER — METHYLPREDNISOLONE SODIUM SUCC 125 MG IJ SOLR
125.0000 mg | Freq: Once | INTRAMUSCULAR | Status: AC
  Administered 2016-02-25: 125 mg via INTRAVENOUS
  Filled 2016-02-25: qty 2

## 2016-02-25 MED ORDER — FAMOTIDINE IN NACL 20-0.9 MG/50ML-% IV SOLN
20.0000 mg | Freq: Once | INTRAVENOUS | Status: AC
  Administered 2016-02-25: 20 mg via INTRAVENOUS
  Filled 2016-02-25: qty 50

## 2016-02-25 NOTE — ED Notes (Signed)
Pt comfortable with discharge and follow up instructions. Pt declines wheelchair, escorted to waiting area by this RN. 
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
## 2016-02-25 NOTE — Discharge Instructions (Signed)
Discontinue home lisinopril use. Follow up with her primary care provider for reevaluation and medication adjustment. Return to the emergency department if you experience severe worsening of her symptoms, increased lip or facial swelling, tongue swelling, difficulty breathing or difficulty swallowing.

## 2016-02-25 NOTE — ED Notes (Signed)
Pt presents via POV from home with c/o left sided facial swelling that she noted upon waking this morning. She denies throat tightening but states there is a sensation of a "hard swallow".  She recently restarted Lisinopril and started Carvedilol and questions if this is an allergic reaction.  She also notes itching yesterday and for a few days even before starting these new medications.  Pt is A&Ox4, in NAD. Airway intact.

## 2016-03-08 ENCOUNTER — Other Ambulatory Visit: Payer: Self-pay | Admitting: Nurse Practitioner

## 2016-03-08 DIAGNOSIS — R109 Unspecified abdominal pain: Secondary | ICD-10-CM

## 2016-03-13 ENCOUNTER — Telehealth: Payer: Self-pay | Admitting: Cardiology

## 2016-03-13 NOTE — Telephone Encounter (Signed)
New message     Pt c/o medication issue:  1. Name of Medication: lisinopril 2. How are you currently taking this medication (dosage and times per day)? Stopped rx 02-25-16  3. Are you having a reaction (difficulty breathing--STAT)? no 4. What is your medication issue? Pt was seen in the ER on 02-25-16 with an reaction to the lisinopril.  She is still not taking it.  Is there something else Dr Elberta Fortis want to presc?

## 2016-03-15 ENCOUNTER — Ambulatory Visit
Admission: RE | Admit: 2016-03-15 | Discharge: 2016-03-15 | Disposition: A | Discharge status: Home / Self Care | Payer: Medicaid Other | Source: Ambulatory Visit | Attending: Nurse Practitioner | Admitting: Nurse Practitioner

## 2016-03-15 DIAGNOSIS — R109 Unspecified abdominal pain: Secondary | ICD-10-CM

## 2016-03-15 NOTE — Telephone Encounter (Signed)
Lisinopril stopped secondary to angioedema. Informed patient that Dr. Elberta Fortis reviewed. Patient does need to be rx something else. Continue currently treatment plan. Patient verbalized understanding and agreeable to plan.

## 2016-04-26 ENCOUNTER — Encounter: Payer: Self-pay | Admitting: Hematology

## 2016-04-26 ENCOUNTER — Telehealth: Payer: Self-pay | Admitting: Hematology

## 2016-04-26 NOTE — Telephone Encounter (Signed)
Telephone call to patient, no answer. Made call to the referring to setup an appointment. Appointment with Dr. Candise Che on 6/22 at 2pm given to Joy. She will notify the patient of the appointment. Letter to patient and the referring.

## 2016-05-04 ENCOUNTER — Ambulatory Visit (HOSPITAL_BASED_OUTPATIENT_CLINIC_OR_DEPARTMENT_OTHER): Payer: Medicaid Other | Admitting: Hematology

## 2016-05-04 ENCOUNTER — Other Ambulatory Visit (HOSPITAL_BASED_OUTPATIENT_CLINIC_OR_DEPARTMENT_OTHER): Payer: Medicaid Other

## 2016-05-04 ENCOUNTER — Telehealth: Payer: Self-pay | Admitting: Hematology

## 2016-05-04 ENCOUNTER — Encounter: Payer: Self-pay | Admitting: Hematology

## 2016-05-04 ENCOUNTER — Telehealth: Payer: Self-pay | Admitting: *Deleted

## 2016-05-04 VITALS — BP 119/85 | HR 79 | Temp 98.4°F | Resp 16 | Ht 68.0 in | Wt 172.6 lb

## 2016-05-04 DIAGNOSIS — R1084 Generalized abdominal pain: Secondary | ICD-10-CM

## 2016-05-04 DIAGNOSIS — D509 Iron deficiency anemia, unspecified: Secondary | ICD-10-CM | POA: Diagnosis not present

## 2016-05-04 DIAGNOSIS — Z9884 Bariatric surgery status: Secondary | ICD-10-CM

## 2016-05-04 DIAGNOSIS — R109 Unspecified abdominal pain: Secondary | ICD-10-CM

## 2016-05-04 DIAGNOSIS — E538 Deficiency of other specified B group vitamins: Secondary | ICD-10-CM

## 2016-05-04 DIAGNOSIS — R634 Abnormal weight loss: Secondary | ICD-10-CM

## 2016-05-04 DIAGNOSIS — D573 Sickle-cell trait: Secondary | ICD-10-CM | POA: Diagnosis not present

## 2016-05-04 DIAGNOSIS — E611 Iron deficiency: Secondary | ICD-10-CM | POA: Diagnosis not present

## 2016-05-04 LAB — CBC & DIFF AND RETIC
BASO%: 0.8 % (ref 0.0–2.0)
BASOS ABS: 0.1 10*3/uL (ref 0.0–0.1)
EOS ABS: 0.2 10*3/uL (ref 0.0–0.5)
EOS%: 2.8 % (ref 0.0–7.0)
HCT: 25.7 % — ABNORMAL LOW (ref 34.8–46.6)
HEMOGLOBIN: 7.5 g/dL — AB (ref 11.6–15.9)
IMMATURE RETIC FRACT: 12.6 % — AB (ref 1.60–10.00)
LYMPH%: 29.4 % (ref 14.0–49.7)
MCH: 18.4 pg — ABNORMAL LOW (ref 25.1–34.0)
MCHC: 29.3 g/dL — ABNORMAL LOW (ref 31.5–36.0)
MCV: 62.9 fL — AB (ref 79.5–101.0)
MONO#: 0.5 10*3/uL (ref 0.1–0.9)
MONO%: 7.7 % (ref 0.0–14.0)
NEUT#: 3.9 10*3/uL (ref 1.5–6.5)
NEUT%: 59.3 % (ref 38.4–76.8)
NRBC: 0 % (ref 0–0)
Platelets: 330 10*3/uL (ref 145–400)
RBC: 4.09 10*6/uL (ref 3.70–5.45)
RDW: 20.7 % — AB (ref 11.2–14.5)
RETIC CT ABS: 30.68 10*3/uL — AB (ref 33.70–90.70)
Retic %: 0.75 % (ref 0.70–2.10)
WBC: 6.5 10*3/uL (ref 3.9–10.3)
lymph#: 1.9 10*3/uL (ref 0.9–3.3)

## 2016-05-04 LAB — COMPREHENSIVE METABOLIC PANEL
ALBUMIN: 4.1 g/dL (ref 3.5–5.0)
ALK PHOS: 100 U/L (ref 40–150)
ALT: 12 U/L (ref 0–55)
AST: 19 U/L (ref 5–34)
Anion Gap: 11 mEq/L (ref 3–11)
BILIRUBIN TOTAL: 0.65 mg/dL (ref 0.20–1.20)
BUN: 18.3 mg/dL (ref 7.0–26.0)
CO2: 27 mEq/L (ref 22–29)
CREATININE: 1.2 mg/dL — AB (ref 0.6–1.1)
Calcium: 9.8 mg/dL (ref 8.4–10.4)
Chloride: 100 mEq/L (ref 98–109)
EGFR: 59 mL/min/{1.73_m2} — ABNORMAL LOW (ref >90)
GLUCOSE: 187 mg/dL — AB (ref 70–140)
Potassium: 4 mEq/L (ref 3.5–5.1)
SODIUM: 137 meq/L (ref 136–145)
TOTAL PROTEIN: 8.5 g/dL — AB (ref 6.4–8.3)

## 2016-05-04 LAB — CHCC SMEAR

## 2016-05-04 MED ORDER — B COMPLEX VITAMINS PO CAPS: 1.0000 | ORAL_CAPSULE | Freq: Every day | ORAL | Status: DC

## 2016-05-04 NOTE — Telephone Encounter (Signed)
Per staff message and POF I have scheduled appts. Advised scheduler of appts. JMW  
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
## 2016-05-04 NOTE — Telephone Encounter (Signed)
Gave pt cal & avs, waiting on feraheme apt

## 2016-05-04 NOTE — Telephone Encounter (Signed)
left msg confirming 6/28 apt °

## 2016-05-05 ENCOUNTER — Other Ambulatory Visit: Payer: Self-pay | Admitting: *Deleted

## 2016-05-05 DIAGNOSIS — D509 Iron deficiency anemia, unspecified: Secondary | ICD-10-CM

## 2016-05-05 LAB — IRON AND TIBC
%SAT: 5 % — AB (ref 21–57)
Iron: 28 ug/dL — ABNORMAL LOW (ref 41–142)
TIBC: 533 ug/dL — ABNORMAL HIGH (ref 236–444)
UIBC: 505 ug/dL — AB (ref 120–384)

## 2016-05-05 LAB — LACTATE DEHYDROGENASE: LDH: 202 U/L (ref 125–245)

## 2016-05-05 LAB — FERRITIN: FERRITIN: 11 ng/mL (ref 9–269)

## 2016-05-05 LAB — VITAMIN B12: VITAMIN B 12: 583 pg/mL (ref 211–946)

## 2016-05-05 MED ORDER — B COMPLEX VITAMINS PO CAPS: 1.0000 | ORAL_CAPSULE | Freq: Every day | ORAL | Status: DC

## 2016-05-08 LAB — HEMOGLOBINOPATHY EVALUATION
HEMOGLOBIN A2 QUANTITATION: 3.4 % — AB (ref 0.7–3.1)
HEMOGLOBIN F QUANTITATION: 0 % (ref 0.0–2.0)
HGB C: 0 %
HGB S: 31.3 % — ABNORMAL HIGH
Hgb A: 65.3 % — ABNORMAL LOW (ref 94.0–98.0)

## 2016-05-10 ENCOUNTER — Ambulatory Visit (HOSPITAL_BASED_OUTPATIENT_CLINIC_OR_DEPARTMENT_OTHER): Payer: Medicaid Other

## 2016-05-10 VITALS — BP 108/78 | HR 88 | Temp 98.4°F | Resp 18

## 2016-05-10 DIAGNOSIS — D509 Iron deficiency anemia, unspecified: Secondary | ICD-10-CM | POA: Diagnosis not present

## 2016-05-10 MED ORDER — SODIUM CHLORIDE 0.9 % IV SOLN
Freq: Once | INTRAVENOUS | Status: AC
  Administered 2016-05-10: 10:00:00 via INTRAVENOUS

## 2016-05-10 MED ORDER — SODIUM CHLORIDE 0.9 % IV SOLN
510.0000 mg | Freq: Once | INTRAVENOUS | Status: AC
  Administered 2016-05-10: 510 mg via INTRAVENOUS
  Filled 2016-05-10: qty 17

## 2016-05-10 NOTE — Patient Instructions (Signed)
Iron-Rich Diet Iron is a mineral that helps your body to produce hemoglobin. Hemoglobin is a protein in your red blood cells that carries oxygen to your body's tissues. Eating too little iron may cause you to feel weak and tired, and it can increase your risk for infection. Eating enough iron is necessary for your body's metabolism, muscle function, and nervous system. Iron is naturally found in many foods. It can also be added to foods or fortified in foods. There are two types of dietary iron:  Heme iron. Heme iron is absorbed by the body more easily than nonheme iron. Heme iron is found in meat, poultry, and fish.  Nonheme iron. Nonheme iron is found in dietary supplements, iron-fortified grains, beans, and vegetables. You may need to follow an iron-rich diet if:  You have been diagnosed with iron deficiency or iron-deficiency anemia.  You have a condition that prevents you from absorbing dietary iron, such as:  Infection in your intestines.  Celiac disease. This involves long-lasting (chronic) inflammation of your intestines.  You do not eat enough iron.  You eat a diet that is high in foods that impair iron absorption.  You have lost a lot of blood.  You have heavy bleeding during your menstrual cycle.  You are pregnant. WHAT IS MY PLAN? Your health care provider may help you to determine how much iron you need per day based on your condition. Generally, when a person consumes sufficient amounts of iron in the diet, the following iron needs are met:  Men.  66-36 years old: 11 mg per day.  3-32 years old: 8 mg per day.  Women.   53-70 years old: 15 mg per day.  38-54 years old: 18 mg per day.  Over 31 years old: 8 mg per day.  Pregnant women: 27 mg per day.  Breastfeeding women: 9 mg per day. WHAT DO I NEED TO KNOW ABOUT AN IRON-RICH DIET?  Eat fresh fruits and vegetables that are high in vitamin C along with foods that are high in iron. This will help increase  the amount of iron that your body absorbs from food, especially with foods containing nonheme iron. Foods that are high in vitamin C include oranges, peppers, tomatoes, and mango.  Take iron supplements only as directed by your health care provider. Overdose of iron can be life-threatening. If you were prescribed iron supplements, take them with orange juice or a vitamin C supplement.  Cook foods in pots and pans that are made from iron.   Eat nonheme iron-containing foods alongside foods that are high in heme iron. This helps to improve your iron absorption.   Certain foods and drinks contain compounds that impair iron absorption. Avoid eating these foods in the same meal as iron-rich foods or with iron supplements. These include:  Coffee, black tea, and red wine.  Milk, dairy products, and foods that are high in calcium.  Beans, soybeans, and peas.  Whole grains.  When eating foods that contain both nonheme iron and compounds that impair iron absorption, follow these tips to absorb iron better.   Soak beans overnight before cooking.  Soak whole grains overnight and drain them before using.  Ferment flours before baking, such as using yeast in bread dough. WHAT FOODS CAN I EAT? Grains Iron-fortified breakfast cereal. Iron-fortified whole-wheat bread. Enriched rice. Sprouted grains. Vegetables Spinach. Potatoes with skin. Green peas. Broccoli. Red and green bell peppers. Fermented vegetables. Fruits Prunes. Raisins. Oranges. Strawberries. Mango. Grapefruit. Meats and Other Protein  Sources Beef liver. Oysters. Beef. Shrimp. [place]. Chicken. [place]. Sardines. Chickpeas. Nuts. Tofu. Beverages Tomato juice. Fresh orange juice. Prune juice. Hibiscus tea. Fortified instant breakfast shakes. Condiments Tahini. Fermented soy sauce. Sweets and Desserts Black-strap molasses.  Other Wheat germ. The items listed above may not be a complete list of recommended foods or  beverages. Contact your dietitian for more options. WHAT FOODS ARE NOT RECOMMENDED? Grains Whole grains. Bran cereal. Bran flour. Oats. Vegetables Artichokes. Brussels sprouts. Kale. Fruits Blueberries. Raspberries. Strawberries. Figs. Meats and Other Protein Sources Soybeans. Products made from soy protein. Dairy Milk. Cream. Cheese. Yogurt. Cottage cheese. Beverages Coffee. Black tea. Red wine. Sweets and Desserts Cocoa. Chocolate. Ice cream. Other Basil. Oregano. Parsley. The items listed above may not be a complete list of foods and beverages to avoid. Contact your dietitian for more information.   This information is not intended to replace advice given to you by your health care provider. Make sure you discuss any questions you have with your health care provider.   Document Released: 06/13/2005 Document Revised: 11/20/2014 Document Reviewed: 05/27/2014 Elsevier Interactive Patient Education 2016 [place] injection What is this medicine? FERUMOXYTOL is an iron complex. Iron is used to make healthy red blood cells, which carry oxygen and nutrients throughout the body. This medicine is used to treat iron deficiency anemia in people with chronic kidney disease. This medicine may be used for other purposes; ask your health care provider or pharmacist if you have questions. What should I tell my health care provider before I take this medicine? They need to know if you have any of these conditions: -anemia not caused by low iron levels -high levels of iron in the blood -magnetic resonance imaging (MRI) test scheduled -an unusual or allergic reaction to iron, other medicines, foods, dyes, or preservatives -pregnant or trying to get pregnant -breast-feeding How should I use this medicine? This medicine is for injection into a vein. It is given by a health care professional in a hospital or clinic setting. Talk to your pediatrician regarding the use of  this medicine in children. Special care may be needed. Overdosage: If you think you have taken too much of this medicine contact a poison control center or emergency room at once. NOTE: This medicine is only for you. Do not share this medicine with others. What if I miss a dose? It is important not to miss your dose. Call your doctor or health care professional if you are unable to keep an appointment. What may interact with this medicine? This medicine may interact with the following medications: -other iron products This list may not describe all possible interactions. Give your health care provider a list of all the medicines, herbs, non-prescription drugs, or dietary supplements you use. Also tell them if you smoke, drink alcohol, or use illegal drugs. Some items may interact with your medicine. What should I watch for while using this medicine? Visit your doctor or healthcare professional regularly. Tell your doctor or healthcare professional if your symptoms do not start to get better or if they get worse. You may need blood work done while you are taking this medicine. You may need to follow a special diet. Talk to your doctor. Foods that contain iron include: whole grains/cereals, dried fruits, beans, or peas, leafy green vegetables, and organ meats (liver, kidney). What side effects may I notice from receiving this medicine? Side effects that you should report to your doctor or health care professional as soon as possible: -allergic  reactions like skin rash, itching or hives, swelling of the face, lips, or tongue -breathing problems -changes in blood pressure -feeling faint or lightheaded, falls -fever or chills -flushing, sweating, or hot feelings -swelling of the ankles or feet Side effects that usually do not require medical attention (Report these to your doctor or health care professional if they continue or are bothersome.): -diarrhea -headache -nausea, vomiting -stomach  pain This list may not describe all possible side effects. Call your doctor for medical advice about side effects. You may report side effects to FDA at 1-800-FDA-1088. Where should I keep my medicine? This drug is given in a hospital or clinic and will not be stored at home. NOTE: This sheet is a summary. It may not cover all possible information. If you have questions about this medicine, talk to your doctor, pharmacist, or health care provider.    2016, Elsevier/Gold Standard. (2012-06-14 15:23:36)

## 2016-05-17 ENCOUNTER — Ambulatory Visit (HOSPITAL_BASED_OUTPATIENT_CLINIC_OR_DEPARTMENT_OTHER): Payer: Medicaid Other

## 2016-05-17 VITALS — BP 123/90 | HR 102 | Temp 98.2°F | Resp 16

## 2016-05-17 DIAGNOSIS — D509 Iron deficiency anemia, unspecified: Secondary | ICD-10-CM | POA: Diagnosis not present

## 2016-05-17 MED ORDER — SODIUM CHLORIDE 0.9 % IV SOLN
510.0000 mg | Freq: Once | INTRAVENOUS | Status: AC
  Administered 2016-05-17: 510 mg via INTRAVENOUS
  Filled 2016-05-17: qty 17

## 2016-05-17 MED ORDER — SODIUM CHLORIDE 0.9 % IV SOLN
Freq: Once | INTRAVENOUS | Status: AC
  Administered 2016-05-17: 15:00:00 via INTRAVENOUS

## 2016-05-17 NOTE — Patient Instructions (Signed)
Iron-Rich Diet Iron is a mineral that helps your body to produce hemoglobin. Hemoglobin is a protein in your red blood cells that carries oxygen to your body's tissues. Eating too little iron may cause you to feel weak and tired, and it can increase your risk for infection. Eating enough iron is necessary for your body's metabolism, muscle function, and nervous system. Iron is naturally found in many foods. It can also be added to foods or fortified in foods. There are two types of dietary iron:  Heme iron. Heme iron is absorbed by the body more easily than nonheme iron. Heme iron is found in meat, poultry, and fish.  Nonheme iron. Nonheme iron is found in dietary supplements, iron-fortified grains, beans, and vegetables. You may need to follow an iron-rich diet if:  You have been diagnosed with iron deficiency or iron-deficiency anemia.  You have a condition that prevents you from absorbing dietary iron, such as:  Infection in your intestines.  Celiac disease. This involves long-lasting (chronic) inflammation of your intestines.  You do not eat enough iron.  You eat a diet that is high in foods that impair iron absorption.  You have lost a lot of blood.  You have heavy bleeding during your menstrual cycle.  You are pregnant. WHAT IS MY PLAN? Your health care provider may help you to determine how much iron you need per day based on your condition. Generally, when a person consumes sufficient amounts of iron in the diet, the following iron needs are met:  Men.  55-59 years old: 11 mg per day.  5-27 years old: 8 mg per day.  Women.   35-30 years old: 15 mg per day.  51-43 years old: 18 mg per day.  Over 67 years old: 8 mg per day.  Pregnant women: 27 mg per day.  Breastfeeding women: 9 mg per day. WHAT DO I NEED TO KNOW ABOUT AN IRON-RICH DIET?  Eat fresh fruits and vegetables that are high in vitamin C along with foods that are high in iron. This will help increase  the amount of iron that your body absorbs from food, especially with foods containing nonheme iron. Foods that are high in vitamin C include oranges, peppers, tomatoes, and mango.  Take iron supplements only as directed by your health care provider. Overdose of iron can be life-threatening. If you were prescribed iron supplements, take them with orange juice or a vitamin C supplement.  Cook foods in pots and pans that are made from iron.   Eat nonheme iron-containing foods alongside foods that are high in heme iron. This helps to improve your iron absorption.   Certain foods and drinks contain compounds that impair iron absorption. Avoid eating these foods in the same meal as iron-rich foods or with iron supplements. These include:  Coffee, black tea, and red wine.  Milk, dairy products, and foods that are high in calcium.  Beans, soybeans, and peas.  Whole grains.  When eating foods that contain both nonheme iron and compounds that impair iron absorption, follow these tips to absorb iron better.   Soak beans overnight before cooking.  Soak whole grains overnight and drain them before using.  Ferment flours before baking, such as using yeast in bread dough. WHAT FOODS CAN I EAT? Grains Iron-fortified breakfast cereal. Iron-fortified whole-wheat bread. Enriched rice. Sprouted grains. Vegetables Spinach. Potatoes with skin. Green peas. Broccoli. Red and green bell peppers. Fermented vegetables. Fruits Prunes. Raisins. Oranges. Strawberries. Mango. Grapefruit. Meats and Other Protein  Sources Beef liver. Oysters. Beef. Shrimp. [place]. Chicken. [place]. Sardines. Chickpeas. Nuts. Tofu. Beverages Tomato juice. Fresh orange juice. Prune juice. Hibiscus tea. Fortified instant breakfast shakes. Condiments Tahini. Fermented soy sauce. Sweets and Desserts Black-strap molasses.  Other Wheat germ. The items listed above may not be a complete list of recommended foods or  beverages. Contact your dietitian for more options. WHAT FOODS ARE NOT RECOMMENDED? Grains Whole grains. Bran cereal. Bran flour. Oats. Vegetables Artichokes. Brussels sprouts. Kale. Fruits Blueberries. Raspberries. Strawberries. Figs. Meats and Other Protein Sources Soybeans. Products made from soy protein. Dairy Milk. Cream. Cheese. Yogurt. Cottage cheese. Beverages Coffee. Black tea. Red wine. Sweets and Desserts Cocoa. Chocolate. Ice cream. Other Basil. Oregano. Parsley. The items listed above may not be a complete list of foods and beverages to avoid. Contact your dietitian for more information.   This information is not intended to replace advice given to you by your health care provider. Make sure you discuss any questions you have with your health care provider.   Document Released: 06/13/2005 Document Revised: 11/20/2014 Document Reviewed: 05/27/2014 Elsevier Interactive Patient Education 2016 [place] injection What is this medicine? FERUMOXYTOL is an iron complex. Iron is used to make healthy red blood cells, which carry oxygen and nutrients throughout the body. This medicine is used to treat iron deficiency anemia in people with chronic kidney disease. This medicine may be used for other purposes; ask your health care provider or pharmacist if you have questions. What should I tell my health care provider before I take this medicine? They need to know if you have any of these conditions: -anemia not caused by low iron levels -high levels of iron in the blood -magnetic resonance imaging (MRI) test scheduled -an unusual or allergic reaction to iron, other medicines, foods, dyes, or preservatives -pregnant or trying to get pregnant -breast-feeding How should I use this medicine? This medicine is for injection into a vein. It is given by a health care professional in a hospital or clinic setting. Talk to your pediatrician regarding the use of  this medicine in children. Special care may be needed. Overdosage: If you think you have taken too much of this medicine contact a poison control center or emergency room at once. NOTE: This medicine is only for you. Do not share this medicine with others. What if I miss a dose? It is important not to miss your dose. Call your doctor or health care professional if you are unable to keep an appointment. What may interact with this medicine? This medicine may interact with the following medications: -other iron products This list may not describe all possible interactions. Give your health care provider a list of all the medicines, herbs, non-prescription drugs, or dietary supplements you use. Also tell them if you smoke, drink alcohol, or use illegal drugs. Some items may interact with your medicine. What should I watch for while using this medicine? Visit your doctor or healthcare professional regularly. Tell your doctor or healthcare professional if your symptoms do not start to get better or if they get worse. You may need blood work done while you are taking this medicine. You may need to follow a special diet. Talk to your doctor. Foods that contain iron include: whole grains/cereals, dried fruits, beans, or peas, leafy green vegetables, and organ meats (liver, kidney). What side effects may I notice from receiving this medicine? Side effects that you should report to your doctor or health care professional as soon as possible: -allergic  reactions like skin rash, itching or hives, swelling of the face, lips, or tongue -breathing problems -changes in blood pressure -feeling faint or lightheaded, falls -fever or chills -flushing, sweating, or hot feelings -swelling of the ankles or feet Side effects that usually do not require medical attention (Report these to your doctor or health care professional if they continue or are bothersome.): -diarrhea -headache -nausea, vomiting -stomach  pain This list may not describe all possible side effects. Call your doctor for medical advice about side effects. You may report side effects to FDA at 1-800-FDA-1088. Where should I keep my medicine? This drug is given in a hospital or clinic and will not be stored at home. NOTE: This sheet is a summary. It may not cover all possible information. If you have questions about this medicine, talk to your doctor, pharmacist, or health care provider.    2016, Elsevier/Gold Standard. (2012-06-14 15:23:36)

## 2016-05-17 NOTE — Progress Notes (Signed)
Patient did not want to stay for 30 minute observation after Feraheme infusion. No complaints after infusion.

## 2016-05-24 ENCOUNTER — Ambulatory Visit: Payer: Self-pay

## 2016-05-25 ENCOUNTER — Other Ambulatory Visit: Payer: Self-pay | Admitting: *Deleted

## 2016-05-25 ENCOUNTER — Ambulatory Visit: Payer: Self-pay

## 2016-05-25 ENCOUNTER — Telehealth: Payer: Self-pay | Admitting: Hematology

## 2016-05-25 NOTE — Telephone Encounter (Signed)
cld & spoke to pt and gave pt time date for r/s appt for 7/14 fera

## 2016-05-26 ENCOUNTER — Ambulatory Visit: Payer: Self-pay

## 2016-05-31 ENCOUNTER — Other Ambulatory Visit: Payer: Self-pay | Admitting: *Deleted

## 2016-05-31 ENCOUNTER — Telehealth: Payer: Self-pay | Admitting: Hematology

## 2016-05-31 DIAGNOSIS — D509 Iron deficiency anemia, unspecified: Secondary | ICD-10-CM

## 2016-05-31 NOTE — Telephone Encounter (Signed)
left message confirming added lab for 7/20

## 2016-06-01 ENCOUNTER — Ambulatory Visit: Payer: Self-pay | Admitting: Hematology

## 2016-06-01 ENCOUNTER — Other Ambulatory Visit: Payer: Self-pay

## 2016-06-07 ENCOUNTER — Telehealth: Payer: Self-pay | Admitting: Hematology

## 2016-06-07 NOTE — Telephone Encounter (Signed)
pt cl and wanted to r/s appt-per [facility] nurse pt has been NO SHOW on several appts-adv pt to [facility] sure to make r.s appt-she can ask r Candise Che about fera appt then.Gave 06/13/16 appt @3 :00

## 2016-06-13 ENCOUNTER — Other Ambulatory Visit: Payer: Self-pay

## 2016-06-13 ENCOUNTER — Ambulatory Visit: Payer: Self-pay | Admitting: Hematology

## 2016-06-26 ENCOUNTER — Inpatient Hospital Stay (HOSPITAL_COMMUNITY)
Admission: EM | Admit: 2016-06-26 | Discharge: 2016-06-30 | DRG: 394 | Disposition: A | Discharge status: Home / Self Care | Payer: Medicaid Other | Attending: Internal Medicine | Admitting: Internal Medicine

## 2016-06-26 ENCOUNTER — Emergency Department (HOSPITAL_COMMUNITY): Payer: Medicaid Other

## 2016-06-26 ENCOUNTER — Encounter (HOSPITAL_COMMUNITY): Payer: Self-pay | Admitting: Emergency Medicine

## 2016-06-26 DIAGNOSIS — Z885 Allergy status to narcotic agent status: Secondary | ICD-10-CM | POA: Diagnosis not present

## 2016-06-26 DIAGNOSIS — K449 Diaphragmatic hernia without obstruction or gangrene: Secondary | ICD-10-CM | POA: Diagnosis present

## 2016-06-26 DIAGNOSIS — K9589 Other complications of other bariatric procedure: Secondary | ICD-10-CM | POA: Diagnosis present

## 2016-06-26 DIAGNOSIS — K921 Melena: Secondary | ICD-10-CM | POA: Diagnosis present

## 2016-06-26 DIAGNOSIS — Z79899 Other long term (current) drug therapy: Secondary | ICD-10-CM | POA: Diagnosis not present

## 2016-06-26 DIAGNOSIS — K259 Gastric ulcer, unspecified as acute or chronic, without hemorrhage or perforation: Secondary | ICD-10-CM | POA: Diagnosis present

## 2016-06-26 DIAGNOSIS — Z87442 Personal history of urinary calculi: Secondary | ICD-10-CM | POA: Diagnosis not present

## 2016-06-26 DIAGNOSIS — K561 Intussusception: Secondary | ICD-10-CM | POA: Diagnosis present

## 2016-06-26 DIAGNOSIS — Z9884 Bariatric surgery status: Secondary | ICD-10-CM | POA: Diagnosis not present

## 2016-06-26 DIAGNOSIS — I10 Essential (primary) hypertension: Secondary | ICD-10-CM | POA: Diagnosis present

## 2016-06-26 DIAGNOSIS — Z818 Family history of other mental and behavioral disorders: Secondary | ICD-10-CM

## 2016-06-26 DIAGNOSIS — K625 Hemorrhage of anus and rectum: Secondary | ICD-10-CM

## 2016-06-26 DIAGNOSIS — Z888 Allergy status to other drugs, medicaments and biological substances status: Secondary | ICD-10-CM

## 2016-06-26 DIAGNOSIS — E785 Hyperlipidemia, unspecified: Secondary | ICD-10-CM | POA: Diagnosis present

## 2016-06-26 DIAGNOSIS — Z91030 Bee allergy status: Secondary | ICD-10-CM

## 2016-06-26 DIAGNOSIS — K8020 Calculus of gallbladder without cholecystitis without obstruction: Secondary | ICD-10-CM | POA: Diagnosis present

## 2016-06-26 DIAGNOSIS — D649 Anemia, unspecified: Secondary | ICD-10-CM

## 2016-06-26 DIAGNOSIS — K922 Gastrointestinal hemorrhage, unspecified: Secondary | ICD-10-CM | POA: Diagnosis not present

## 2016-06-26 DIAGNOSIS — I110 Hypertensive heart disease with heart failure: Secondary | ICD-10-CM | POA: Diagnosis present

## 2016-06-26 DIAGNOSIS — D509 Iron deficiency anemia, unspecified: Secondary | ICD-10-CM | POA: Diagnosis present

## 2016-06-26 DIAGNOSIS — G4730 Sleep apnea, unspecified: Secondary | ICD-10-CM | POA: Diagnosis present

## 2016-06-26 DIAGNOSIS — Z8701 Personal history of pneumonia (recurrent): Secondary | ICD-10-CM

## 2016-06-26 DIAGNOSIS — I5022 Chronic systolic (congestive) heart failure: Secondary | ICD-10-CM | POA: Diagnosis present

## 2016-06-26 DIAGNOSIS — D62 Acute posthemorrhagic anemia: Secondary | ICD-10-CM | POA: Diagnosis present

## 2016-06-26 DIAGNOSIS — Z823 Family history of stroke: Secondary | ICD-10-CM | POA: Diagnosis not present

## 2016-06-26 DIAGNOSIS — D573 Sickle-cell trait: Secondary | ICD-10-CM | POA: Diagnosis present

## 2016-06-26 DIAGNOSIS — F329 Major depressive disorder, single episode, unspecified: Secondary | ICD-10-CM | POA: Diagnosis present

## 2016-06-26 DIAGNOSIS — F4310 Post-traumatic stress disorder, unspecified: Secondary | ICD-10-CM | POA: Diagnosis present

## 2016-06-26 DIAGNOSIS — F418 Other specified anxiety disorders: Secondary | ICD-10-CM | POA: Diagnosis not present

## 2016-06-26 LAB — COMPREHENSIVE METABOLIC PANEL
ALT: 17 U/L (ref 14–54)
ANION GAP: 8 (ref 5–15)
AST: 28 U/L (ref 15–41)
Albumin: 4 g/dL (ref 3.5–5.0)
Alkaline Phosphatase: 75 U/L (ref 38–126)
BILIRUBIN TOTAL: 0.6 mg/dL (ref 0.3–1.2)
BUN: 35 mg/dL — ABNORMAL HIGH (ref 6–20)
CALCIUM: 9 mg/dL (ref 8.9–10.3)
CHLORIDE: 101 mmol/L (ref 101–111)
CO2: 28 mmol/L (ref 22–32)
CREATININE: 0.76 mg/dL (ref 0.44–1.00)
GLUCOSE: 100 mg/dL — AB (ref 65–99)
POTASSIUM: 3.8 mmol/L (ref 3.5–5.1)
Sodium: 137 mmol/L (ref 135–145)
Total Protein: 6.7 g/dL (ref 6.5–8.1)

## 2016-06-26 LAB — IRON AND TIBC
Iron: 149 ug/dL (ref 28–170)
Saturation Ratios: 46 % — ABNORMAL HIGH (ref 10.4–31.8)
TIBC: 336 ug/dL (ref 250–450)
UIBC: 182 ug/dL

## 2016-06-26 LAB — FOLATE: FOLATE: 16 ng/mL (ref >5.9)

## 2016-06-26 LAB — VITAMIN B12: Vitamin B-12: 175 pg/mL — ABNORMAL LOW (ref 180–914)

## 2016-06-26 LAB — I-STAT CHEM 8, ED
BUN: 33 mg/dL — AB (ref 6–20)
CALCIUM ION: 1.11 mmol/L — AB (ref 1.13–1.30)
Chloride: 100 mmol/L — ABNORMAL LOW (ref 101–111)
Creatinine, Ser: 0.8 mg/dL (ref 0.44–1.00)
GLUCOSE: 96 mg/dL (ref 65–99)
HCT: 26 % — ABNORMAL LOW (ref 36.0–46.0)
Hemoglobin: 8.8 g/dL — ABNORMAL LOW (ref 12.0–15.0)
Potassium: 3.9 mmol/L (ref 3.5–5.1)
SODIUM: 139 mmol/L (ref 135–145)
TCO2: 27 mmol/L (ref 0–100)

## 2016-06-26 LAB — CBC
HCT: 24.6 % — ABNORMAL LOW (ref 36.0–46.0)
Hemoglobin: 7.7 g/dL — ABNORMAL LOW (ref 12.0–15.0)
MCH: 25.6 pg — AB (ref 26.0–34.0)
MCHC: 32.1 g/dL (ref 30.0–36.0)
MCV: 79.9 fL (ref 78.0–100.0)
PLATELETS: 167 10*3/uL (ref 150–400)
RBC: 3.08 MIL/uL — ABNORMAL LOW (ref 3.87–5.11)
WBC: 6.5 10*3/uL (ref 4.0–10.5)

## 2016-06-26 LAB — FERRITIN: FERRITIN: 45 ng/mL (ref 11–307)

## 2016-06-26 LAB — MRSA PCR SCREENING: MRSA BY PCR: NEGATIVE

## 2016-06-26 LAB — RETICULOCYTES
RBC.: 2.71 MIL/uL — AB (ref 3.87–5.11)
RETIC COUNT ABSOLUTE: 29.8 10*3/uL (ref 19.0–186.0)
RETIC CT PCT: 1.1 % (ref 0.4–3.1)

## 2016-06-26 LAB — PREPARE RBC (CROSSMATCH)

## 2016-06-26 LAB — POC OCCULT BLOOD, ED: Fecal Occult Bld: POSITIVE — AB

## 2016-06-26 LAB — HEMOGLOBIN AND HEMATOCRIT, BLOOD
HCT: 21.7 % — ABNORMAL LOW (ref 36.0–46.0)
HEMOGLOBIN: 7.1 g/dL — AB (ref 12.0–15.0)

## 2016-06-26 MED ORDER — HYDROMORPHONE HCL 1 MG/ML IJ SOLN
0.5000 mg | Freq: Once | INTRAMUSCULAR | Status: AC
  Administered 2016-06-26: 0.5 mg via INTRAVENOUS
  Filled 2016-06-26: qty 1

## 2016-06-26 MED ORDER — ATORVASTATIN CALCIUM 10 MG PO TABS
10.0000 mg | ORAL_TABLET | Freq: Every day | ORAL | Status: DC
  Administered 2016-06-26 – 2016-06-30 (×5): 10 mg via ORAL
  Filled 2016-06-26 (×5): qty 1

## 2016-06-26 MED ORDER — SODIUM CHLORIDE 0.9 % IV SOLN
80.0000 mg | Freq: Once | INTRAVENOUS | Status: DC
  Filled 2016-06-26: qty 80

## 2016-06-26 MED ORDER — CARVEDILOL 6.25 MG PO TABS
6.2500 mg | ORAL_TABLET | Freq: Two times a day (BID) | ORAL | Status: DC
  Administered 2016-06-26 – 2016-06-30 (×8): 6.25 mg via ORAL
  Filled 2016-06-26 (×8): qty 1

## 2016-06-26 MED ORDER — PANTOPRAZOLE SODIUM 40 MG IV SOLR
8.0000 mg/h | INTRAVENOUS | Status: AC
  Administered 2016-06-26 – 2016-06-29 (×8): 8 mg/h via INTRAVENOUS
  Filled 2016-06-26 (×14): qty 80

## 2016-06-26 MED ORDER — ACETAMINOPHEN 325 MG PO TABS
650.0000 mg | ORAL_TABLET | Freq: Four times a day (QID) | ORAL | Status: DC | PRN
  Administered 2016-06-26 – 2016-06-29 (×2): 650 mg via ORAL
  Filled 2016-06-26 (×2): qty 2

## 2016-06-26 MED ORDER — PANTOPRAZOLE SODIUM 40 MG IV SOLR
40.0000 mg | Freq: Two times a day (BID) | INTRAVENOUS | Status: DC
  Administered 2016-06-29 – 2016-06-30 (×2): 40 mg via INTRAVENOUS
  Filled 2016-06-26 (×2): qty 40

## 2016-06-26 MED ORDER — PANTOPRAZOLE SODIUM 40 MG IV SOLR
40.0000 mg | Freq: Two times a day (BID) | INTRAVENOUS | Status: DC
  Administered 2016-06-26: 40 mg via INTRAVENOUS
  Filled 2016-06-26: qty 40

## 2016-06-26 MED ORDER — PANTOPRAZOLE SODIUM 40 MG IV SOLR
40.0000 mg | Freq: Once | INTRAVENOUS | Status: AC
  Administered 2016-06-26: 40 mg via INTRAVENOUS
  Filled 2016-06-26: qty 40

## 2016-06-26 MED ORDER — TRAZODONE HCL 50 MG PO TABS
50.0000 mg | ORAL_TABLET | Freq: Every day | ORAL | Status: DC
  Administered 2016-06-27: 50 mg via ORAL
  Filled 2016-06-26: qty 1

## 2016-06-26 MED ORDER — DIATRIZOATE MEGLUMINE & SODIUM 66-10 % PO SOLN
30.0000 mL | Freq: Once | ORAL | Status: AC
  Administered 2016-06-26: 30 mL via ORAL

## 2016-06-26 MED ORDER — ALBUTEROL SULFATE (2.5 MG/3ML) 0.083% IN NEBU: 2.5000 mg | INHALATION_SOLUTION | RESPIRATORY_TRACT | Status: DC | PRN

## 2016-06-26 MED ORDER — PANTOPRAZOLE SODIUM 40 MG IV SOLR: 40.0000 mg | Freq: Two times a day (BID) | INTRAVENOUS | Status: DC

## 2016-06-26 MED ORDER — KCL IN DEXTROSE-NACL 10-5-0.45 MEQ/L-%-% IV SOLN
INTRAVENOUS | Status: DC
  Administered 2016-06-26 – 2016-06-30 (×4): via INTRAVENOUS
  Filled 2016-06-26 (×8): qty 1000

## 2016-06-26 MED ORDER — ONDANSETRON HCL 4 MG/2ML IJ SOLN
4.0000 mg | Freq: Once | INTRAMUSCULAR | Status: AC
  Administered 2016-06-26: 4 mg via INTRAVENOUS
  Filled 2016-06-26: qty 2

## 2016-06-26 MED ORDER — IOPAMIDOL (ISOVUE-300) INJECTION 61%
100.0000 mL | Freq: Once | INTRAVENOUS | Status: AC | PRN
  Administered 2016-06-26: 100 mL via INTRAVENOUS

## 2016-06-26 MED ORDER — DIPHENHYDRAMINE HCL 50 MG/ML IJ SOLN
25.0000 mg | Freq: Once | INTRAMUSCULAR | Status: AC
  Administered 2016-06-26: 25 mg via INTRAVENOUS
  Filled 2016-06-26: qty 1

## 2016-06-26 MED ORDER — SODIUM CHLORIDE 0.9 % IV SOLN
Freq: Once | INTRAVENOUS | Status: AC
  Administered 2016-06-26: 15:00:00 via INTRAVENOUS

## 2016-06-26 MED ORDER — ONDANSETRON HCL 4 MG/2ML IJ SOLN
4.0000 mg | Freq: Four times a day (QID) | INTRAMUSCULAR | Status: DC | PRN
  Administered 2016-06-26: 4 mg via INTRAVENOUS
  Filled 2016-06-26: qty 2

## 2016-06-26 MED ORDER — ONDANSETRON HCL 4 MG PO TABS: 4.0000 mg | ORAL_TABLET | Freq: Four times a day (QID) | ORAL | Status: DC | PRN

## 2016-06-26 MED ORDER — SODIUM CHLORIDE 0.9 % IV BOLUS (SEPSIS)
1000.0000 mL | Freq: Once | INTRAVENOUS | Status: AC
  Administered 2016-06-26: 1000 mL via INTRAVENOUS

## 2016-06-26 MED ORDER — CLONAZEPAM 0.5 MG PO TABS
0.5000 mg | ORAL_TABLET | Freq: Two times a day (BID) | ORAL | Status: DC
  Administered 2016-06-26 – 2016-06-30 (×8): 0.5 mg via ORAL
  Filled 2016-06-26 (×8): qty 1

## 2016-06-26 MED ORDER — OLANZAPINE 5 MG PO TABS
2.5000 mg | ORAL_TABLET | Freq: Every day | ORAL | Status: DC
Start: 2016-06-26 — End: 2016-06-30
  Administered 2016-06-27 – 2016-06-29 (×3): 2.5 mg via ORAL
  Filled 2016-06-26 (×4): qty 1

## 2016-06-26 MED ORDER — SODIUM CHLORIDE 0.9% FLUSH
3.0000 mL | Freq: Two times a day (BID) | INTRAVENOUS | Status: DC
  Administered 2016-06-27 – 2016-06-30 (×6): 3 mL via INTRAVENOUS

## 2016-06-26 MED ORDER — SERTRALINE HCL 100 MG PO TABS
100.0000 mg | ORAL_TABLET | Freq: Every day | ORAL | Status: DC
  Administered 2016-06-26 – 2016-06-30 (×5): 100 mg via ORAL
  Filled 2016-06-26: qty 2
  Filled 2016-06-26 (×3): qty 1
  Filled 2016-06-26: qty 2

## 2016-06-26 MED ORDER — ONDANSETRON HCL 4 MG/2ML IJ SOLN: 4.0000 mg | Freq: Four times a day (QID) | INTRAMUSCULAR | Status: DC | PRN

## 2016-06-26 NOTE — ED Notes (Signed)
Critical Hgb-6.9 received from lab.  Thedore Mins hospitalist and primary nurse Rosanne Sack made aware.
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
## 2016-06-26 NOTE — ED Triage Notes (Signed)
Pt c/o bright red rectal bleeding x 3 days. Pt states tonight @0400  she became diaphoretic, clammy and felt like she was going to pass out. Pt c/o generalized weakness with abd pain radiating to rectum.

## 2016-06-26 NOTE — ED Notes (Signed)
Pt would like labs pulled from IV.  RN aware.  
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
## 2016-06-26 NOTE — ED Notes (Signed)
Lab called -  Cannot find blue top that was sent down earlier.  Pt is getting transfusion, so it cannot be redrawn.  Called main lab phlebotomy, they are aware that its pending.
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
## 2016-06-27 ENCOUNTER — Encounter (HOSPITAL_COMMUNITY)
Admission: EM | Disposition: A | Discharge status: Home / Self Care | Payer: Self-pay | Source: Home / Self Care | Attending: Internal Medicine

## 2016-06-27 ENCOUNTER — Encounter (HOSPITAL_COMMUNITY): Payer: Self-pay

## 2016-06-27 HISTORY — PX: ESOPHAGOGASTRODUODENOSCOPY: SHX5428

## 2016-06-27 LAB — CBC
HCT: 22.7 % — ABNORMAL LOW (ref 36.0–46.0)
HCT: 21.6 % — ABNORMAL LOW (ref 36.0–46.0)
HEMATOCRIT: 22.1 % — AB (ref 36.0–46.0)
Hemoglobin: 7.1 g/dL — ABNORMAL LOW (ref 12.0–15.0)
Hemoglobin: 7.3 g/dL — ABNORMAL LOW (ref 12.0–15.0)
Hemoglobin: 7 g/dL — ABNORMAL LOW (ref 12.0–15.0)
MCH: 26.1 pg (ref 26.0–34.0)
MCH: 26 pg (ref 26.0–34.0)
MCH: 26.5 pg (ref 26.0–34.0)
MCHC: 32.1 g/dL (ref 30.0–36.0)
MCHC: 32.2 g/dL (ref 30.0–36.0)
MCHC: 32.4 g/dL (ref 30.0–36.0)
MCV: 81.3 fL (ref 78.0–100.0)
MCV: 80.8 fL (ref 78.0–100.0)
MCV: 81.8 fL (ref 78.0–100.0)
PLATELETS: 135 10*3/uL — AB (ref 150–400)
PLATELETS: 132 10*3/uL — AB (ref 150–400)
Platelets: 124 10*3/uL — ABNORMAL LOW (ref 150–400)
RBC: 2.72 MIL/uL — ABNORMAL LOW (ref 3.87–5.11)
RBC: 2.81 MIL/uL — ABNORMAL LOW (ref 3.87–5.11)
RBC: 2.64 MIL/uL — ABNORMAL LOW (ref 3.87–5.11)
WBC: 4.9 10*3/uL (ref 4.0–10.5)
WBC: 4.6 10*3/uL (ref 4.0–10.5)
WBC: 5.2 10*3/uL (ref 4.0–10.5)

## 2016-06-27 LAB — BASIC METABOLIC PANEL
Anion gap: 6 (ref 5–15)
BUN: 18 mg/dL (ref 6–20)
CHLORIDE: 106 mmol/L (ref 101–111)
CO2: 27 mmol/L (ref 22–32)
Calcium: 8.5 mg/dL — ABNORMAL LOW (ref 8.9–10.3)
Creatinine, Ser: 0.76 mg/dL (ref 0.44–1.00)
GFR calc Af Amer: >60 mL/min (ref >60)
GFR calc non Af Amer: >60 mL/min (ref >60)
GLUCOSE: 99 mg/dL (ref 65–99)
POTASSIUM: 3.4 mmol/L — AB (ref 3.5–5.1)
Sodium: 139 mmol/L (ref 135–145)

## 2016-06-27 LAB — HEMOGLOBIN AND HEMATOCRIT, BLOOD
HCT: 21.3 % — ABNORMAL LOW (ref 36.0–46.0)
HCT: 22.8 % — ABNORMAL LOW (ref 36.0–46.0)
Hemoglobin: 6.9 g/dL — CL (ref 12.0–15.0)
Hemoglobin: 7.5 g/dL — ABNORMAL LOW (ref 12.0–15.0)

## 2016-06-27 SURGERY — EGD (ESOPHAGOGASTRODUODENOSCOPY)
Anesthesia: Moderate Sedation | Laterality: Left

## 2016-06-27 MED ORDER — MIDAZOLAM HCL 10 MG/2ML IJ SOLN
INTRAMUSCULAR | Status: DC | PRN
  Administered 2016-06-27 (×3): 1 mg via INTRAVENOUS
  Administered 2016-06-27: 2 mg via INTRAVENOUS

## 2016-06-27 MED ORDER — SODIUM CHLORIDE 0.9 % IV SOLN: INTRAVENOUS | Status: DC

## 2016-06-27 MED ORDER — MIDAZOLAM HCL 5 MG/ML IJ SOLN
INTRAMUSCULAR | Status: AC
  Filled 2016-06-27: qty 2

## 2016-06-27 MED ORDER — FENTANYL CITRATE (PF) 100 MCG/2ML IJ SOLN
INTRAMUSCULAR | Status: AC
  Filled 2016-06-27: qty 2

## 2016-06-27 MED ORDER — DIPHENHYDRAMINE HCL 50 MG/ML IJ SOLN
INTRAMUSCULAR | Status: AC
  Filled 2016-06-27: qty 1

## 2016-06-27 MED ORDER — TRAMADOL HCL 50 MG PO TABS
50.0000 mg | ORAL_TABLET | Freq: Four times a day (QID) | ORAL | Status: DC | PRN
  Administered 2016-06-27 – 2016-06-29 (×3): 50 mg via ORAL
  Filled 2016-06-27 (×3): qty 1

## 2016-06-27 MED ORDER — FENTANYL CITRATE (PF) 100 MCG/2ML IJ SOLN
INTRAMUSCULAR | Status: DC | PRN
  Administered 2016-06-27 (×2): 25 ug via INTRAVENOUS

## 2016-06-27 NOTE — Op Note (Signed)
[facility] Patient Name: Julia Wheeler Procedure Date: 06/27/2016 MRN: [id number] Attending MD: Jeani Hawking , MD Date of Birth: December 19, 1965 CSN: [id number] Age: 50 Admit Type: Inpatient Procedure:                Upper GI endoscopy Indications:              Hematochezia, Heme positive stool, Melena Providers:                Jeani Hawking, MD, Priscella Mann, RN, Clearnce Sorrel, Technician Referring MD:              Medicines:                Midazolam 5 mg IV, Fentanyl 50 micrograms IV Complications:            No immediate complications. Estimated Blood Loss:     Estimated blood loss was minimal. Procedure:                Pre-Anesthesia Assessment:                           - Prior to the procedure, a History and Physical                            was performed, and patient medications and                            allergies were reviewed. The patient's tolerance of                            previous anesthesia was also reviewed. The risks                            and benefits of the procedure and the sedation                            options and risks were discussed with the patient.                            All questions were answered, and informed consent                            was obtained. Prior Anticoagulants: The patient has                            taken no previous anticoagulant or antiplatelet                            agents. ASA Grade Assessment: II - A patient with                            mild systemic disease. After reviewing the risks  and benefits, the patient was deemed in                            satisfactory condition to undergo the procedure.                           - Sedation was administered by an endoscopy nurse.                            The sedation level attained was moderate.                           After obtaining informed consent, the endoscope was                             passed under direct vision. Throughout the                            procedure, the patient's blood pressure, pulse, and                            oxygen saturations were monitored continuously. The                            EG-[id number] ([id number] scope was introduced through the                            mouth, and advanced to the proximal jejunum. The                            upper GI endoscopy was accomplished without                            difficulty. The patient tolerated the procedure                            well. Scope In: Scope Out: Findings:      A 3 cm hiatal hernia was present.      Evidence of a Roux-en-Y gastrojejunostomy was found. The gastrojejunal       anastomosis was characterized by friable mucosa and ulceration. This was       traversed. The pouch-to-jejunum limb was characterized by healthy       appearing mucosa.      The examined jejunum was normal.      Two non-bleeding cratered gastric ulcers with a visible vessel were       found at the anastomosis. The largest lesion was 5 mm in largest       dimension. Fulguration to stop the bleeding by bipolar probe was       successful. Estimated blood loss was minimal.      The gastric pouch was approximately 3 cm in length. One of the       anastamotic ulcers exhibited a visible vessel versus hemocystic spot,       the former is favored. Three to four applications of BICAP at 5 second  intervals were used to ablate the vessel. Impression:               - 3 cm hiatal hernia.                           - Roux-en-Y gastrojejunostomy with gastrojejunal                            anastomosis characterized by friable mucosa and                            ulceration.                           - Normal examined jejunum.                           - Non-bleeding gastric ulcers with a visible                            vessel. Treated with bipolar cautery.                           - No specimens  collected. Moderate Sedation:      Moderate (conscious) sedation was administered by the endoscopy nurse       and supervised by the endoscopist. The following parameters were       monitored: oxygen saturation, heart rate, blood pressure, and response       to care. Recommendation:           - Return patient to hospital ward for ongoing care.                           - Clear liquid diet.                           - Continue present medications. Procedure Code(s):        --- Professional ---                           [id number], Esophagogastroduodenoscopy, flexible,                            transoral; with control of bleeding, any method Diagnosis Code(s):        --- Professional ---                           K25.4, Chronic or unspecified gastric ulcer with                            hemorrhage                           K44.9, Diaphragmatic hernia without obstruction or                            gangrene  Z98.0, Intestinal bypass and anastomosis status                           K92.1, Melena (includes Hematochezia)                           R19.5, Other fecal abnormalities CPT copyright 2016 [employer]. All rights reserved. The codes documented in this report are preliminary and upon coder review may  be revised to meet current compliance requirements. Jeani Hawking, MD Jeani Hawking, MD 06/27/2016 2:43:25 PM This report has been signed electronically. Number of Addenda: 0

## 2016-06-27 NOTE — Progress Notes (Signed)
PROGRESS NOTE    Julia Wheeler  FMB:[id number] DOB: 06/14/1966 DOA: 06/26/2016 PCP: Zachery Dauer, FNP   Brief Narrative: Julia Wheeler  is a 50 y.o. female, With history of gastric bypass surgery done in 2005 out of town, chronic systolic heart failure EF 40% follows with [place] [facility], PTSD, migraine headaches, essential hypertension, iron deficiency anemia whose been having issues with off and on diarrhea for the last 1 month, some unintentional weight loss of about 10 pounds in the last 1 month, comes in with 2 day history of bright red blood per rectum, some mild nausea with some epigastric discomfort. EGD scheduled for 8/15  Assessment & Plan:   Principal Problem:   UGI bleed Active Problems:   Intussusception of intestine - physiologic & self-resolved   Essential hypertension   Anemia, iron deficiency   PTSD (post-traumatic stress disorder)   Chronic left systolic heart failure (HCC)   Bright red blood per rectum Patient s/p 1u PRBCs. Hemoglobin stable. Currently not symptomatic. No chest pain or dyspnea. -EGD per gastroenterology -CBC now, in evening and tomorrow morning -Continue PPI  Chronic CHF Last EF of 40-45%. Currently euvolemic -Continue fluids until diet can be advanced  Essential Hypertension -Continue beta blocker and Norvasc -Continue to hold diuretic  PTSD Depression Anxiety -Continue Klonipin, Zoloft, Zyprexa and trazodone  Hyperlipidemia -continue atorvastatin 10mg  daily  History of iron deficiency anemia Iron panel does not suggest iron deficiency at this time. Overall anemia on this encounter likely secondary to acute GI bleed  DVT prophylaxis: SCDs Code Status: Full Code Family Communication: None at bedside Disposition Plan: home pending workup   Consultants:   Gastroenterology, Dr. Loreta Ave  Procedures:   EGD (8/15)  Antimicrobials:  None    Subjective: Patient reports three bloody bowel movements that are described as  liquid.  Objective: Vitals:   06/27/16 1440 06/27/16 1450 06/27/16 1521 06/27/16 1600  BP: 114/66 111/77 108/73 100/63  Pulse: 83 77 84 94  Resp: 13 18 18 15   Temp:      TempSrc:      SpO2: 97% 96% 97% 98%  Weight:      Height:        Intake/Output Summary (Last 24 hours) at 06/27/16 1643 Last data filed at 06/27/16 1621  Gross per 24 hour  Intake          3086.25 ml  Output             1602 ml  Net          1484.25 ml   Filed Weights   06/27/16 0011 06/27/16 0340 06/27/16 1349  Weight: 78.8 kg (173 lb 11.6 oz) 78.8 kg (173 lb 11.6 oz) 78.5 kg (173 lb)    Examination:  General exam: Appears calm and comfortable  Eyes: Pale conjunctivae Respiratory system: Clear to auscultation. Respiratory effort normal. Cardiovascular system: S1 & S2 heard, RRR. No murmurs, rubs, gallops or clicks. No pedal edema. Gastrointestinal system: Abdomen is nondistended, soft and nontender. No organomegaly or masses felt. Normal bowel sounds heard. Central nervous system: Alert and oriented. No focal neurological deficits. Extremities: Symmetric 5 x 5 power. There is a small, 1cm mildly tender nodule over left thigh Skin: No rashes, lesions or ulcers Psychiatry: Judgement and insight appear normal. Mood & affect appropriate.     Data Reviewed: I have personally reviewed following labs and imaging studies  CBC:  Recent Labs Lab 06/26/16 0734  06/26/16 1250 06/26/16 1946 06/26/16 2351 06/27/16 0318 06/27/16  1002  WBC 6.5  --   --   --   --  4.9 4.6  HGB 7.7*  < > 6.9* 7.1* 7.5* 7.1* 7.3*  HCT 24.6*  < > 21.3* 21.7* 22.8* 22.1* 22.7*  MCV 79.9  --   --   --   --  81.3 80.8  PLT 167  --   --   --   --  124* 135*  < > = values in this interval not displayed. Basic Metabolic Panel:  Recent Labs Lab 06/26/16 0734 06/26/16 0752 06/27/16 0318  NA 137 139 139  K 3.8 3.9 3.4*  CL 101 100* 106  CO2 28  --  27  GLUCOSE 100* 96 99  BUN 35* 33* 18  CREATININE 0.76 0.80 0.76  CALCIUM  9.0  --  8.5*   GFR: Estimated Creatinine Clearance: 93.6 mL/min (by C-G formula based on SCr of 0.8 mg/dL). Liver Function Tests:  Recent Labs Lab 06/26/16 0734  AST 28  ALT 17  ALKPHOS 75  BILITOT 0.6  PROT 6.7  ALBUMIN 4.0   No results for input(s): LIPASE, AMYLASE in the last 168 hours. No results for input(s): AMMONIA in the last 168 hours. Coagulation Profile: No results for input(s): INR, PROTIME in the last 168 hours. Cardiac Enzymes: No results for input(s): CKTOTAL, CKMB, CKMBINDEX, TROPONINI in the last 168 hours. BNP (last 3 results) No results for input(s): PROBNP in the last 8760 hours. HbA1C: No results for input(s): HGBA1C in the last 72 hours. CBG: No results for input(s): GLUCAP in the last 168 hours. Lipid Profile: No results for input(s): CHOL, HDL, LDLCALC, TRIG, CHOLHDL, LDLDIRECT in the last 72 hours. Thyroid Function Tests: No results for input(s): TSH, T4TOTAL, FREET4, T3FREE, THYROIDAB in the last 72 hours. Anemia Panel:  Recent Labs  06/26/16 1248 06/26/16 1250  VITAMINB12 175*  --   FOLATE 16.0  --   FERRITIN 45  --   TIBC 336  --   IRON 149  --   RETICCTPCT  --  1.1   Sepsis Labs: No results for input(s): PROCALCITON, LATICACIDVEN in the last 168 hours.  Recent Results (from the past 240 hour(s))  MRSA PCR Screening     Status: None   Collection Time: 06/26/16  4:29 PM  Result Value Ref Range Status   MRSA by PCR NEGATIVE NEGATIVE Final    Comment:        The GeneXpert MRSA Assay (FDA approved for NASAL specimens only), is one component of a comprehensive MRSA colonization surveillance program. It is not intended to diagnose MRSA infection nor to guide or monitor treatment for MRSA infections.          Radiology Studies: Ct Abdomen Pelvis W Contrast  Result Date: 06/26/2016 CLINICAL DATA:  Left abdominal pain x3 days, rectal bleeding, unintentional weight loss. History of gastric bypass and small bowel resection  EXAM: CT ABDOMEN AND PELVIS WITH CONTRAST TECHNIQUE: Multidetector CT imaging of the abdomen and pelvis was performed using the standard protocol following bolus administration of intravenous contrast. CONTRAST:  100mL ISOVUE-300 IOPAMIDOL (ISOVUE-300) INJECTION 61% COMPARISON:  04/07/2016 FINDINGS: Lower chest:  Lung bases are clear. Hepatobiliary: Liver is within normal limits. Layering gallstones (series 2/ image 29), without associated inflammatory changes. No intrahepatic or extrahepatic ductal dilatation. Pancreas: Within normal limits. Spleen: Within normal limits. Adrenals/Urinary Tract: Adrenal glands are within normal limits. 6 mm cyst in the lateral left lower kidney (series 2/ image 27). 4 mm cyst in  the medial right lower kidney (series 2/ image 32). No hydronephrosis. Bladder is within normal limits. Stomach/Bowel: Postsurgical changes related to gastric bypass. Patent gastrojejunostomy. No evidence of bowel obstruction. Normal appendix (series 2/ image 63). Vascular/Lymphatic: No evidence of abdominal aortic aneurysm. No suspicious abdominopelvic lymphadenopathy. Reproductive: Uterus is notable for a 1.6 cm intramural fibroid in the anterior uterine fundus (series 2/image 65). Bilateral ovaries are unremarkable. Other: No abdominopelvic ascites. Musculoskeletal: Visualized osseous structures are within normal limits. IMPRESSION: Status post gastric bypass with patent gastrojejunostomy. No evidence of bowel obstruction. No CT findings to account for the patient's left abdominal pain. Electronically Signed   By: Charline Bills M.D.   On: 06/26/2016 10:44        Scheduled Meds: . atorvastatin  10 mg Oral Daily  . carvedilol  6.25 mg Oral BID  . clonazePAM  0.5 mg Oral BID  . OLANZapine  2.5 mg Oral QHS  . [START ON 06/29/2016] pantoprazole (PROTONIX) IV  40 mg Intravenous Q12H  . sertraline  100 mg Oral Daily  . sodium chloride flush  3 mL Intravenous Q12H  . traZODone  50 mg Oral QHS    Continuous Infusions: . dextrose 5 % and 0.45 % NaCl with KCl 10 mEq/L 75 mL/hr at 06/26/16 1644  . pantoprozole (PROTONIX) infusion 8 mg/hr (06/27/16 1621)     LOS: 1 day     Jacquelin Hawking, MD [facility] Pager [phone]  If 7PM-7AM, please contact night-coverage [email or web address] Password [facility] 06/27/2016, 4:43 PM

## 2016-06-27 NOTE — Plan of Care (Signed)
Problem: Safety: Goal: Ability to remain free from injury will improve Outcome: Progressing Up ad lib low fall risk

## 2016-06-28 DIAGNOSIS — F418 Other specified anxiety disorders: Secondary | ICD-10-CM

## 2016-06-28 DIAGNOSIS — D62 Acute posthemorrhagic anemia: Secondary | ICD-10-CM

## 2016-06-28 DIAGNOSIS — I10 Essential (primary) hypertension: Secondary | ICD-10-CM

## 2016-06-28 DIAGNOSIS — K922 Gastrointestinal hemorrhage, unspecified: Secondary | ICD-10-CM

## 2016-06-28 LAB — CBC
HEMATOCRIT: 20.7 % — AB (ref 36.0–46.0)
HEMOGLOBIN: 6.7 g/dL — AB (ref 12.0–15.0)
MCH: 26.5 pg (ref 26.0–34.0)
MCHC: 32.4 g/dL (ref 30.0–36.0)
MCV: 81.8 fL (ref 78.0–100.0)
Platelets: 133 10*3/uL — ABNORMAL LOW (ref 150–400)
RBC: 2.53 MIL/uL — ABNORMAL LOW (ref 3.87–5.11)
WBC: 4 10*3/uL (ref 4.0–10.5)

## 2016-06-28 LAB — PREPARE RBC (CROSSMATCH)

## 2016-06-28 LAB — HEMOGLOBIN AND HEMATOCRIT, BLOOD
HEMATOCRIT: 23.9 % — AB (ref 36.0–46.0)
HEMOGLOBIN: 7.8 g/dL — AB (ref 12.0–15.0)

## 2016-06-28 MED ORDER — SODIUM CHLORIDE 0.9 % IV SOLN
Freq: Once | INTRAVENOUS | Status: AC
  Administered 2016-06-28: 07:00:00 via INTRAVENOUS

## 2016-06-28 MED ORDER — ZOLPIDEM TARTRATE 5 MG PO TABS
5.0000 mg | ORAL_TABLET | Freq: Every evening | ORAL | Status: DC | PRN
  Administered 2016-06-28 – 2016-06-29 (×2): 5 mg via ORAL
  Filled 2016-06-28 (×2): qty 1

## 2016-06-28 NOTE — Progress Notes (Signed)
Received report from RN, agree with assessment.   Georg Ruddle, RN

## 2016-06-28 NOTE — Progress Notes (Addendum)
Patient ID: Julia Wheeler, female   DOB: 09-Sep-1966, 50 y.o.   MRN: [id number]  PROGRESS NOTE    Julia Wheeler  WUJ:[id number] DOB: 15-Aug-1966 DOA: 06/26/2016  PCP: Zachery Dauer, FNP   Brief Narrative:  50 y.o.female with history of gastric bypass surgery done in 2005, chronic systolic heart failure EF 40% (follows with [place] cardiology), PTSD, migraine headaches, essential hypertension, iron deficiency anemia who presented with concerns for ongoing diarrhea for past 1 month prior to this admission, unintentional weight loss of about 10 pounds over past 1 month prior to this admission and history of 2 days of bright red blood per rectum associated with mild nausea and epigastric discomfort. Patient underwent EGD 06/27/2016 with findings significant for non-bleeding gastric ulcers with visible vessel.  Assessment & Plan:   Acute upper GI bleed / acute blood loss anemia - Likely secondary to nonbleeding gastric ulcers - EGD done 06/27/2016 with none of bleeding visible gastric ulcers treated with cautery - Continue to monitor CBC - GI may do colonoscopy if hemoglobin continues to drop - Patient is status post 1 unit of PRBC transfusion so far - Continue to monitor in step down unit for next 24 hours - Continue Protonix 40 mg IV every 12 hours  Chronic systolic CHF - Compensated  Essential Hypertension - Continue carvedilol 6.25 mg twice daily   PTSD / Depression / Anxiety - Continue Klonipin, Zoloft, Zyprexa and trazodone  Hyperlipidemia - Continue statin therapy     DVT prophylaxis: SCDs due to risk of bleeding Code Status: full code  Family Communication: No family at bedside Disposition Plan: Home once stable from GI standpoint   Consultants:   GI, Dr. Jeani Hawking   Procedures:   EGD 06/27/2016 - 3 cm hiatal hernia, nonbleeding gastric ulcers with visible vessel treated with bipolar cautery  Antimicrobials:   None    Subjective: No overnight events.    Objective: Vitals:   06/28/16 0626 06/28/16 0645 06/28/16 0700 06/28/16 0805  BP: (!) 84/56 100/70 106/78   Pulse: 72 67 67   Resp: 10 14 16    Temp: 98.7 F (37.1 C) 98.1 F (36.7 C)  98.2 F (36.8 C)  TempSrc: Oral Oral  Oral  SpO2: 97% 97% 97%   Weight:      Height:        Intake/Output Summary (Last 24 hours) at 06/28/16 0811 Last data filed at 06/28/16 0600  Gross per 24 hour  Intake          3349.58 ml  Output             2500 ml  Net           849.58 ml   Filed Weights   06/27/16 0340 06/27/16 1349 06/28/16 0608  Weight: 78.8 kg (173 lb 11.6 oz) 78.5 kg (173 lb) 78.8 kg (173 lb 11.6 oz)    Examination:  General exam: Appears calm and comfortable  Respiratory system: Clear to auscultation. Respiratory effort normal. Cardiovascular system: S1 & S2 heard, RRR. No JVD, murmurs, rubs, gallops or clicks. No pedal edema. Gastrointestinal system: Abdomen is nondistended, soft and nontender. No organomegaly or masses felt. Normal bowel sounds heard. Central nervous system: Alert and oriented. No focal neurological deficits. Extremities: Symmetric 5 x 5 power. Skin: No rashes, lesions or ulcers Psychiatry: Judgement and insight appear normal. Mood & affect appropriate.   Data Reviewed: I have personally reviewed following labs and imaging studies  CBC:  Recent Labs Lab  06/26/16 0734  06/26/16 2351 06/27/16 0318 06/27/16 1002 06/27/16 1910 06/28/16 0305  WBC 6.5  --   --  4.9 4.6 5.2 4.0  HGB 7.7*  < > 7.5* 7.1* 7.3* 7.0* 6.7*  HCT 24.6*  < > 22.8* 22.1* 22.7* 21.6* 20.7*  MCV 79.9  --   --  81.3 80.8 81.8 81.8  PLT 167  --   --  124* 135* 132* 133*  < > = values in this interval not displayed. Basic Metabolic Panel:  Recent Labs Lab 06/26/16 0734 06/26/16 0752 06/27/16 0318  NA 137 139 139  K 3.8 3.9 3.4*  CL 101 100* 106  CO2 28  --  27  GLUCOSE 100* 96 99  BUN 35* 33* 18  CREATININE 0.76 0.80 0.76  CALCIUM 9.0  --  8.5*   GFR: Estimated  Creatinine Clearance: 93.9 mL/min (by C-G formula based on SCr of 0.8 mg/dL). Liver Function Tests:  Recent Labs Lab 06/26/16 0734  AST 28  ALT 17  ALKPHOS 75  BILITOT 0.6  PROT 6.7  ALBUMIN 4.0   No results for input(s): LIPASE, AMYLASE in the last 168 hours. No results for input(s): AMMONIA in the last 168 hours. Coagulation Profile: No results for input(s): INR, PROTIME in the last 168 hours. Cardiac Enzymes: No results for input(s): CKTOTAL, CKMB, CKMBINDEX, TROPONINI in the last 168 hours. BNP (last 3 results) No results for input(s): PROBNP in the last 8760 hours. HbA1C: No results for input(s): HGBA1C in the last 72 hours. CBG: No results for input(s): GLUCAP in the last 168 hours. Lipid Profile: No results for input(s): CHOL, HDL, LDLCALC, TRIG, CHOLHDL, LDLDIRECT in the last 72 hours. Thyroid Function Tests: No results for input(s): TSH, T4TOTAL, FREET4, T3FREE, THYROIDAB in the last 72 hours. Anemia Panel:  Recent Labs  06/26/16 1248 06/26/16 1250  VITAMINB12 175*  --   FOLATE 16.0  --   FERRITIN 45  --   TIBC 336  --   IRON 149  --   RETICCTPCT  --  1.1   Urine analysis:    Component Value Date/Time   COLORURINE YELLOW 12/27/2014 0600   APPEARANCEUR CLEAR 12/27/2014 0600   LABSPEC 1.015 12/27/2014 0600   PHURINE 5.5 12/27/2014 0600   GLUCOSEU NEGATIVE 12/27/2014 0600   HGBUR NEGATIVE 12/27/2014 0600   BILIRUBINUR NEGATIVE 12/27/2014 0600   KETONESUR 40 (A) 12/27/2014 0600   PROTEINUR NEGATIVE 12/27/2014 0600   UROBILINOGEN 1.0 12/27/2014 0600   NITRITE NEGATIVE 12/27/2014 0600   LEUKOCYTESUR NEGATIVE 12/27/2014 0600   Sepsis Labs: @LABRCNTIP (procalcitonin:4,lacticidven:4)    Recent Results (from the past 240 hour(s))  MRSA PCR Screening     Status: None   Collection Time: 06/26/16  4:29 PM  Result Value Ref Range Status   MRSA by PCR NEGATIVE NEGATIVE Final      Radiology Studies: Ct Abdomen Pelvis W Contrast Result Date: 06/26/2016   Status post gastric bypass with patent gastrojejunostomy. No evidence of bowel obstruction. No CT findings to account for the patient's left abdominal pain. Electronically Signed   By: Charline BillsSriyesh  Krishnan M.D.   On: 06/26/2016 10:44      Scheduled Meds: . sodium chloride   Intravenous Once  . atorvastatin  10 mg Oral Daily  . carvedilol  6.25 mg Oral BID  . clonazePAM  0.5 mg Oral BID  . OLANZapine  2.5 mg Oral QHS  . [START ON 06/29/2016] pantoprazole (PROTONIX) IV  40 mg Intravenous Q12H  . sertraline  100 mg Oral Daily  . sodium chloride flush  3 mL Intravenous Q12H  . traZODone  50 mg Oral QHS   Continuous Infusions: . dextrose 5 % and 0.45 % NaCl with KCl 10 mEq/L 75 mL/hr at 06/27/16 2107  . pantoprozole (PROTONIX) infusion 8 mg/hr (06/28/16 0332)     LOS: 2 days    Time spent: 25 minutes  Greater than 50% of the time spent on counseling and coordinating the care.   Manson Passey, MD [facility] Pager [phone]  If 7PM-7AM, please contact night-coverage [email or web address] Password [email or web address] 06/28/2016, 8:11 AM

## 2016-06-28 NOTE — Progress Notes (Signed)
CRITICAL VALUE ALERT  Critical value received:  HGB 6.7   Date of notification:  06/28/16  Time of notification:  0430  Critical value read back:yes   Nurse who received alert:  Suszanne Conners   MD notified (1st page):  Craige Cotta NP   Time of first page:  [phone]   MD notified (2nd page):  Time of second page:  Responding MD:    Time MD responded:

## 2016-06-28 NOTE — Progress Notes (Signed)
Subjective: Feeling tired.  No further bloody bowel movement.  In fact, no bowel movements.  Objective: Vital signs in last 24 hours: Temp:  [97.9 F (36.6 C)-98.7 F (37.1 C)] 98.1 F (36.7 C) (08/16 0645) Pulse Rate:  [67-100] 67 (08/16 0700) Resp:  [6-32] 16 (08/16 0700) BP: (80-139)/(44-93) 106/78 (08/16 0700) SpO2:  [91 %-100 %] 97 % (08/16 0700) Weight:  [78.5 kg (173 lb)-78.8 kg (173 lb 11.6 oz)] 78.8 kg (173 lb 11.6 oz) (08/16 0608) Last BM Date: 06/26/16  Intake/Output from previous day: 08/15 0701 - 08/16 0700 In: 3849.6 [P.O.:1200; I.V.:2649.6] Out: 2900 [Urine:2900] Intake/Output this shift: No intake/output data recorded.  General appearance: alert and no distress GI: soft, non-tender; bowel sounds normal; no masses,  no organomegaly  Lab Results:  Recent Labs  06/27/16 1002 06/27/16 1910 06/28/16 0305  WBC 4.6 5.2 4.0  HGB 7.3* 7.0* 6.7*  HCT 22.7* 21.6* 20.7*  PLT 135* 132* 133*   BMET  Recent Labs  06/26/16 0734 06/26/16 0752 06/27/16 0318  NA 137 139 139  K 3.8 3.9 3.4*  CL 101 100* 106  CO2 28  --  27  GLUCOSE 100* 96 99  BUN 35* 33* 18  CREATININE 0.76 0.80 0.76  CALCIUM 9.0  --  8.5*   LFT  Recent Labs  06/26/16 0734  PROT 6.7  ALBUMIN 4.0  AST 28  ALT 17  ALKPHOS 75  BILITOT 0.6   PT/INR No results for input(s): LABPROT, INR in the last 72 hours. Hepatitis Panel No results for input(s): HEPBSAG, HCVAB, HEPAIGM, HEPBIGM in the last 72 hours. C-Diff No results for input(s): CDIFFTOX in the last 72 hours. Fecal Lactopherrin No results for input(s): FECLLACTOFRN in the last 72 hours.  Studies/Results: Ct Abdomen Pelvis W Contrast  Result Date: 06/26/2016 CLINICAL DATA:  Left abdominal pain x3 days, rectal bleeding, unintentional weight loss. History of gastric bypass and small bowel resection EXAM: CT ABDOMEN AND PELVIS WITH CONTRAST TECHNIQUE: Multidetector CT imaging of the abdomen and pelvis was performed using the  standard protocol following bolus administration of intravenous contrast. CONTRAST:  100mL ISOVUE-300 IOPAMIDOL (ISOVUE-300) INJECTION 61% COMPARISON:  04/07/2016 FINDINGS: Lower chest:  Lung bases are clear. Hepatobiliary: Liver is within normal limits. Layering gallstones (series 2/ image 29), without associated inflammatory changes. No intrahepatic or extrahepatic ductal dilatation. Pancreas: Within normal limits. Spleen: Within normal limits. Adrenals/Urinary Tract: Adrenal glands are within normal limits. 6 mm cyst in the lateral left lower kidney (series 2/ image 27). 4 mm cyst in the medial right lower kidney (series 2/ image 32). No hydronephrosis. Bladder is within normal limits. Stomach/Bowel: Postsurgical changes related to gastric bypass. Patent gastrojejunostomy. No evidence of bowel obstruction. Normal appendix (series 2/ image 63). Vascular/Lymphatic: No evidence of abdominal aortic aneurysm. No suspicious abdominopelvic lymphadenopathy. Reproductive: Uterus is notable for a 1.6 cm intramural fibroid in the anterior uterine fundus (series 2/image 65). Bilateral ovaries are unremarkable. Other: No abdominopelvic ascites. Musculoskeletal: Visualized osseous structures are within normal limits. IMPRESSION: Status post gastric bypass with patent gastrojejunostomy. No evidence of bowel obstruction. No CT findings to account for the patient's left abdominal pain. Electronically Signed   By: Charline BillsSriyesh  Krishnan M.D.   On: 06/26/2016 10:44    Medications:  Scheduled: . sodium chloride   Intravenous Once  . atorvastatin  10 mg Oral Daily  . carvedilol  6.25 mg Oral BID  . clonazePAM  0.5 mg Oral BID  . OLANZapine  2.5 mg Oral QHS  . [  START ON 06/29/2016] pantoprazole (PROTONIX) IV  40 mg Intravenous Q12H  . sertraline  100 mg Oral Daily  . sodium chloride flush  3 mL Intravenous Q12H  . traZODone  50 mg Oral QHS   Continuous: . dextrose 5 % and 0.45 % NaCl with KCl 10 mEq/L 75 mL/hr at 06/27/16  2107  . pantoprozole (PROTONIX) infusion 8 mg/hr (06/28/16 0332)    Assessment/Plan: 1) Gastrojejunal anastamotic ulcer x 2.  One ulcer with a visible vessel. 2) Anemia.   Her HGB did drop, but I feel that this was prior to the treatment of the visibile vessel.  Hopefully this is the site of bleeding.  If she continues to bleed over the next 24-48 hours, I will pursue a colonoscopy.  Plan: 1) Follow HGB. 2) Continue with pantoprazole. 3) Transfuse as necessary.  LOS: 2 days   Jaki Steptoe D 06/28/2016, 7:46 AM

## 2016-06-28 NOTE — Progress Notes (Signed)
Initial Nutrition Assessment  DOCUMENTATION CODES:   Not applicable  INTERVENTION:  - Continue Regular diet. - RD will continue to monitor for needs prior to d/c.  NUTRITION DIAGNOSIS:   Inadequate oral intake related to acute illness as evidenced by per patient/family report.  GOAL:   Patient will meet greater than or equal to 90% of their needs  MONITOR:   PO intake, Weight trends, Labs, I & O's  REASON FOR ASSESSMENT:   Malnutrition Screening Tool  ASSESSMENT:   50 y.o. female, With history of gastric bypass surgery done in 2005 out of town, chronic systolic heart failure EF 40% follows with [place] cardiology, PTSD, migraine headaches, essential hypertension, iron deficiency anemia whose been having issues with off and on diarrhea for the last 1 month, some unintentional weight loss of about 10 pounds in the last 1 month, comes in with 2 day history of bright red blood per rectum, some mild nausea with some epigastric discomfort.  Pt seen for MST. BMI indicates overweight status. Pt consumed 75% of CLD tray for dinner last night, per chart review. Pt drowsy at time of visit; she briefly opened eyes to acknowledge RD and then closed eyes and did not respond to any questions asked. No family/visitors present at this time.  Spoke with RN who reports pt ate ~100% of CLD for breakfast and then requested solid foods. At time of RD visit second breakfast tray was in the room with only a few bites taken of grits and omelet.   Unable to perform physical assessment at this time with respect to pt comfort, did not visualize any wasting to upper body. Per chart review, pt has lost 20 lbs (10% body weight) in the past 5 months which is significant for time frame. Pt may meet criteria for malnutrition but unable to confirm without talking to pt about PO intakes PTA. Will continue to monitor for needs and POC.  Medications reviewed. Labs reviewed; K: 3.4 mmol/L, Ca: 8.5 mg/dL. IVF: D5-1/2  NS 2 75 mL/hr (306 kcal).   Diet Order:  Diet regular Room service appropriate? Yes; Fluid consistency: Thin  Skin:  Reviewed, no issues  Last BM:  8/14  Height:   Ht Readings from Last 1 Encounters:  06/27/16 5\' 8"  (1.727 m)    Weight:   Wt Readings from Last 1 Encounters:  06/28/16 173 lb 11.6 oz (78.8 kg)    Ideal Body Weight:     BMI:  Body mass index is 26.41 kg/m.  Estimated Nutritional Needs:   Kcal:  1550-1750  Protein:  60-70 grams  Fluid:  1.6-1.8 L/day  EDUCATION NEEDS:   No education needs identified at this time    Trenton Gammon, MS, RD, LDN Inpatient Clinical Dietitian Pager # [phone] After hours/weekend pager # [phone]

## 2016-06-29 ENCOUNTER — Encounter (HOSPITAL_COMMUNITY): Payer: Self-pay | Admitting: Gastroenterology

## 2016-06-29 LAB — BASIC METABOLIC PANEL
Anion gap: 6 (ref 5–15)
BUN: 10 mg/dL (ref 6–20)
CHLORIDE: 106 mmol/L (ref 101–111)
CO2: 28 mmol/L (ref 22–32)
Calcium: 8.3 mg/dL — ABNORMAL LOW (ref 8.9–10.3)
Creatinine, Ser: 0.99 mg/dL (ref 0.44–1.00)
GFR calc Af Amer: >60 mL/min (ref >60)
GFR calc non Af Amer: >60 mL/min (ref >60)
GLUCOSE: 87 mg/dL (ref 65–99)
POTASSIUM: 3.7 mmol/L (ref 3.5–5.1)
SODIUM: 140 mmol/L (ref 135–145)

## 2016-06-29 LAB — CBC
HCT: 23.9 % — ABNORMAL LOW (ref 36.0–46.0)
HEMOGLOBIN: 7.8 g/dL — AB (ref 12.0–15.0)
MCH: 27.4 pg (ref 26.0–34.0)
MCHC: 32.6 g/dL (ref 30.0–36.0)
MCV: 83.9 fL (ref 78.0–100.0)
Platelets: 143 10*3/uL — ABNORMAL LOW (ref 150–400)
RBC: 2.85 MIL/uL — AB (ref 3.87–5.11)
RDW: 27.3 % — ABNORMAL HIGH (ref 11.5–15.5)
WBC: 4.2 10*3/uL (ref 4.0–10.5)

## 2016-06-29 MED ORDER — TRAZODONE HCL 50 MG PO TABS: 50.0000 mg | ORAL_TABLET | Freq: Every day | ORAL | Status: DC

## 2016-06-29 NOTE — Progress Notes (Addendum)
Patient ID: Julia Wheeler, female   DOB: 1965/12/01, 50 y.o.   MRN: [id number]  PROGRESS NOTE    Julia Wheeler  NWG:[id number] DOB: [birth year] DOA: 06/26/2016  PCP: Zachery Dauer, FNP   Brief Narrative:  50 y.o.female with history of gastric bypass surgery done in 2005, chronic systolic heart failure EF 40% (follows with  cardiology), PTSD, migraine headaches, essential hypertension, iron deficiency anemia who presented with concerns for ongoing diarrhea for past 1 month prior to this admission, unintentional weight loss of about 10 pounds over past 1 month prior to this admission and history of 2 days of bright red blood per rectum associated with mild nausea and epigastric discomfort. Patient underwent EGD 06/27/2016 with findings significant for non-bleeding gastric ulcers with visible vessel.  Assessment & Plan:   Acute upper GI bleed / acute blood loss anemia - Likely secondary to nonbleeding gastric ulcers - EGD done 06/27/2016 with none of bleeding visible gastric ulcers treated with cautery - Patient is status post 1 unit of PRBC transfusion so far - Her hemoglobin is 7.8 today - Check Hgb in am - Continue Protonix   Chronic systolic CHF - Compensated  Essential Hypertension - Continue carvedilol 6.25 mg twice daily   PTSD / Depression / Anxiety - Continue Klonipin, Zoloft, Zyprexa and trazodone  Hyperlipidemia - Continue statin therapy     DVT prophylaxis: SCDs due to risk of bleeding Code Status: full code  Family Communication: No family at bedside Disposition Plan: Home in a.m. if hemoglobin above 8   Consultants:   GI, Dr. Jeani Hawking   Procedures:   EGD 06/27/2016 - 3 cm hiatal hernia, nonbleeding gastric ulcers with visible vessel treated with bipolar cautery  1 U PRBC transfusion 06/29/2016   Antimicrobials:   None    Subjective: No overnight events. She did not have BM this am.   Objective: Vitals:   06/28/16 2345 06/29/16 0349  06/29/16 0700 06/29/16 0800  BP:    108/69  Pulse:   78 70  Resp:   16 12  Temp: 98.4 F (36.9 C) 98.3 F (36.8 C)  98.4 F (36.9 C)  TempSrc: Oral   Oral  SpO2:   94% 96%  Weight:  81.3 kg (179 lb 3.7 oz)    Height:        Intake/Output Summary (Last 24 hours) at 06/29/16 1154 Last data filed at 06/29/16 0800  Gross per 24 hour  Intake             2635 ml  Output             8960 ml  Net            -6325 ml   Filed Weights   06/27/16 1349 06/28/16 0608 06/29/16 0349  Weight: 78.5 kg (173 lb) 78.8 kg (173 lb 11.6 oz) 81.3 kg (179 lb 3.7 oz)    Examination:  General exam: Appears calm and comfortable, no distress  Respiratory system: Respiratory effort normal. Cardiovascular system: S1 & S2 heard, Rate controlled  Gastrointestinal system: Abdomen is nondistended, soft and nontender. (+) BS Central nervous system: No focal neurological deficits. Extremities: Symmetric 5 x 5 power. No edema Skin: No rashes, lesions or ulcers Psychiatry: Mood & affect appropriate.   Data Reviewed: I have personally reviewed following labs and imaging studies  CBC:  Recent Labs Lab 06/27/16 0318 06/27/16 1002 06/27/16 1910 06/28/16 0305 06/28/16 1059 06/29/16 0341  WBC 4.9 4.6 5.2 4.0  --  4.2  HGB 7.1* 7.3* 7.0* 6.7* 7.8* 7.8*  HCT 22.1* 22.7* 21.6* 20.7* 23.9* 23.9*  MCV 81.3 80.8 81.8 81.8  --  83.9  PLT 124* 135* 132* 133*  --  143*   Basic Metabolic Panel:  Recent Labs Lab 06/26/16 0734 06/26/16 0752 06/27/16 0318 06/29/16 0341  NA 137 139 139 140  K 3.8 3.9 3.4* 3.7  CL 101 100* 106 106  CO2 28  --  27 28  GLUCOSE 100* 96 99 87  BUN 35* 33* 18 10  CREATININE 0.76 0.80 0.76 0.99  CALCIUM 9.0  --  8.5* 8.3*   GFR: Estimated Creatinine Clearance: 76.9 mL/min (by C-G formula based on SCr of 0.99 mg/dL). Liver Function Tests:  Recent Labs Lab 06/26/16 0734  AST 28  ALT 17  ALKPHOS 75  BILITOT 0.6  PROT 6.7  ALBUMIN 4.0   No results for input(s):  LIPASE, AMYLASE in the last 168 hours. No results for input(s): AMMONIA in the last 168 hours. Coagulation Profile: No results for input(s): INR, PROTIME in the last 168 hours. Cardiac Enzymes: No results for input(s): CKTOTAL, CKMB, CKMBINDEX, TROPONINI in the last 168 hours. BNP (last 3 results) No results for input(s): PROBNP in the last 8760 hours. HbA1C: No results for input(s): HGBA1C in the last 72 hours. CBG: No results for input(s): GLUCAP in the last 168 hours. Lipid Profile: No results for input(s): CHOL, HDL, LDLCALC, TRIG, CHOLHDL, LDLDIRECT in the last 72 hours. Thyroid Function Tests: No results for input(s): TSH, T4TOTAL, FREET4, T3FREE, THYROIDAB in the last 72 hours. Anemia Panel:  Recent Labs  06/26/16 1248 06/26/16 1250  VITAMINB12 175*  --   FOLATE 16.0  --   FERRITIN 45  --   TIBC 336  --   IRON 149  --   RETICCTPCT  --  1.1   Urine analysis:    Component Value Date/Time   COLORURINE YELLOW 12/27/2014 0600   APPEARANCEUR CLEAR 12/27/2014 0600   LABSPEC 1.015 12/27/2014 0600   PHURINE 5.5 12/27/2014 0600   GLUCOSEU NEGATIVE 12/27/2014 0600   HGBUR NEGATIVE 12/27/2014 0600   BILIRUBINUR NEGATIVE 12/27/2014 0600   KETONESUR 40 (A) 12/27/2014 0600   PROTEINUR NEGATIVE 12/27/2014 0600   UROBILINOGEN 1.0 12/27/2014 0600   NITRITE NEGATIVE 12/27/2014 0600   LEUKOCYTESUR NEGATIVE 12/27/2014 0600   Sepsis Labs: @LABRCNTIP (procalcitonin:4,lacticidven:4)    Recent Results (from the past 240 hour(s))  MRSA PCR Screening     Status: None   Collection Time: 06/26/16  4:29 PM  Result Value Ref Range Status   MRSA by PCR NEGATIVE NEGATIVE Final      Radiology Studies: Ct Abdomen Pelvis W Contrast Result Date: 06/26/2016  Status post gastric bypass with patent gastrojejunostomy. No evidence of bowel obstruction. No CT findings to account for the patient's left abdominal pain. Electronically Signed   By: Charline Bills M.D.   On: 06/26/2016 10:44       Scheduled Meds: . atorvastatin  10 mg Oral Daily  . carvedilol  6.25 mg Oral BID  . clonazePAM  0.5 mg Oral BID  . OLANZapine  2.5 mg Oral QHS  . pantoprazole (PROTONIX) IV  40 mg Intravenous Q12H  . sertraline  100 mg Oral Daily  . sodium chloride flush  3 mL Intravenous Q12H   Continuous Infusions: . dextrose 5 % and 0.45 % NaCl with KCl 10 mEq/L 75 mL/hr at 06/28/16 2135  . pantoprozole (PROTONIX) infusion 8 mg/hr (06/29/16 7989)  LOS: 3 days    Time spent: 15 minutes  Greater than 50% of the time spent on counseling and coordinating the care.   Manson PasseyEVINE, Annalysa Mohammad, MD [facility] Pager [phone]  If 7PM-7AM, please contact night-coverage [email or web address] Password [email or web address] 06/29/2016, 11:54 AM

## 2016-06-29 NOTE — Progress Notes (Signed)
Report given to Selena Batten, RN, to assume care of patient at transfer to 1331. All belongings sent with patient. Pt alert, oriented, verbal, appropriate at time of transfer. Vital Signs Stable. No distress.

## 2016-06-29 NOTE — Progress Notes (Signed)
Subjective: Feeling better.  No bowel movements.  Objective: Vital signs in last 24 hours: Temp:  [97.9 F (36.6 C)-98.6 F (37 C)] 98.3 F (36.8 C) (08/17 0349) Pulse Rate:  [63-88] 88 (08/16 2000) Resp:  [11-21] 18 (08/16 2000) BP: (87-123)/(58-81) 108/69 (08/16 2000) SpO2:  [95 %-100 %] 97 % (08/16 2000) Weight:  [81.3 kg (179 lb 3.7 oz)] 81.3 kg (179 lb 3.7 oz) (08/17 0349) Last BM Date: 06/26/16  Intake/Output from previous day: 08/16 0701 - 08/17 0700 In: 3431.7 [P.O.:720; I.V.:2336.7; Blood:375] Out: 9760 [Urine:9760] Intake/Output this shift: No intake/output data recorded.  General appearance: alert and no distress GI: soft, non-tender; bowel sounds normal; no masses,  no organomegaly  Lab Results:  Recent Labs  06/27/16 1910 06/28/16 0305 06/28/16 1059 06/29/16 0341  WBC 5.2 4.0  --  4.2  HGB 7.0* 6.7* 7.8* 7.8*  HCT 21.6* 20.7* 23.9* 23.9*  PLT 132* 133*  --  143*   BMET  Recent Labs  06/26/16 0734 06/26/16 0752 06/27/16 0318 06/29/16 0341  NA 137 139 139 140  K 3.8 3.9 3.4* 3.7  CL 101 100* 106 106  CO2 28  --  27 28  GLUCOSE 100* 96 99 87  BUN 35* 33* 18 10  CREATININE 0.76 0.80 0.76 0.99  CALCIUM 9.0  --  8.5* 8.3*   LFT  Recent Labs  06/26/16 0734  PROT 6.7  ALBUMIN 4.0  AST 28  ALT 17  ALKPHOS 75  BILITOT 0.6   PT/INR No results for input(s): LABPROT, INR in the last 72 hours. Hepatitis Panel No results for input(s): HEPBSAG, HCVAB, HEPAIGM, HEPBIGM in the last 72 hours. C-Diff No results for input(s): CDIFFTOX in the last 72 hours. Fecal Lactopherrin No results for input(s): FECLLACTOFRN in the last 72 hours.  Studies/Results: No results found.  Medications:  Scheduled: . atorvastatin  10 mg Oral Daily  . carvedilol  6.25 mg Oral BID  . clonazePAM  0.5 mg Oral BID  . OLANZapine  2.5 mg Oral QHS  . pantoprazole (PROTONIX) IV  40 mg Intravenous Q12H  . sertraline  100 mg Oral Daily  . sodium chloride flush  3 mL  Intravenous Q12H   Continuous: . dextrose 5 % and 0.45 % NaCl with KCl 10 mEq/L 75 mL/hr at 06/28/16 2135  . pantoprozole (PROTONIX) infusion 8 mg/hr (06/28/16 2150)    Assessment/Plan: 1) Anastamotic ulcer. 2) Anemia.   Her HGB is stable.  No further bloody or melenic bowel movements.    Plan: 1) PPI indefinitely. 2) If this issue recurs in the future, she may require surgical revision. 3) Signing off.  LOS: 3 days   Terrion Gencarelli D 06/29/2016, 7:13 AM

## 2016-06-30 LAB — CBC
HEMATOCRIT: 24.8 % — AB (ref 36.0–46.0)
Hemoglobin: 8.1 g/dL — ABNORMAL LOW (ref 12.0–15.0)
MCH: 26.9 pg (ref 26.0–34.0)
MCHC: 32.7 g/dL (ref 30.0–36.0)
MCV: 82.4 fL (ref 78.0–100.0)
Platelets: 148 10*3/uL — ABNORMAL LOW (ref 150–400)
RBC: 3.01 MIL/uL — ABNORMAL LOW (ref 3.87–5.11)
RDW: 26.4 % — AB (ref 11.5–15.5)
WBC: 4.5 10*3/uL (ref 4.0–10.5)

## 2016-06-30 LAB — TYPE AND SCREEN
ABO/RH(D): O POS
ANTIBODY SCREEN: NEGATIVE
Unit division: 0
Unit division: 0
Unit division: 0

## 2016-06-30 MED ORDER — PANTOPRAZOLE SODIUM 40 MG PO TBEC: 40.0000 mg | DELAYED_RELEASE_TABLET | Freq: Every day | ORAL | 0 refills | Status: DC

## 2016-06-30 MED ORDER — ACETAMINOPHEN 325 MG PO TABS
650.0000 mg | ORAL_TABLET | Freq: Once | ORAL | Status: AC
  Administered 2016-06-30: 650 mg via ORAL
  Filled 2016-06-30: qty 2

## 2016-06-30 NOTE — Discharge Instructions (Signed)
Pantoprazole tablets What is this medicine? PANTOPRAZOLE (pan TOE pra zole) prevents the production of acid in the stomach. It is used to treat gastroesophageal reflux disease (GERD), inflammation of the esophagus, and Zollinger-Ellison syndrome. This medicine may be used for other purposes; ask your health care provider or pharmacist if you have questions. What should I tell my health care provider before I take this medicine? They need to know if you have any of these conditions: -liver disease -low levels of magnesium in the blood -an unusual or allergic reaction to omeprazole, lansoprazole, pantoprazole, rabeprazole, other medicines, foods, dyes, or preservatives -pregnant or trying to get pregnant -breast-feeding How should I use this medicine? Take this medicine by mouth. Swallow the tablets whole with a drink of water. Follow the directions on the prescription label. Do not crush, break, or chew. Take your medicine at regular intervals. Do not take your medicine more often than directed. Talk to your pediatrician regarding the use of this medicine in children. While this drug may be prescribed for children as young as 5 years for selected conditions, precautions do apply. Overdosage: If you think you have taken too much of this medicine contact a poison control center or emergency room at once. NOTE: This medicine is only for you. Do not share this medicine with others. What if I miss a dose? If you miss a dose, take it as soon as you can. If it is almost time for your next dose, take only that dose. Do not take double or extra doses. What may interact with this medicine? Do not take this medicine with any of the following medications: -atazanavir -nelfinavir This medicine may also interact with the following medications: -ampicillin -delavirdine -erlotinib -iron salts -medicines for fungal infections like ketoconazole, itraconazole and voriconazole -methotrexate -mycophenolate  mofetil -warfarin This list may not describe all possible interactions. Give your health care provider a list of all the medicines, herbs, non-prescription drugs, or dietary supplements you use. Also tell them if you smoke, drink alcohol, or use illegal drugs. Some items may interact with your medicine. What should I watch for while using this medicine? It can take several days before your stomach pain gets better. Check with your doctor or health care professional if your condition does not start to get better, or if it gets worse. You may need blood work done while you are taking this medicine. What side effects may I notice from receiving this medicine? Side effects that you should report to your doctor or health care professional as soon as possible: -allergic reactions like skin rash, itching or hives, swelling of the face, lips, or tongue -bone, muscle or joint pain -breathing problems -chest pain or chest tightness -dark yellow or brown urine -dizziness -fast, irregular heartbeat -feeling faint or lightheaded -fever or sore throat -muscle spasm -palpitations -redness, blistering, peeling or loosening of the skin, including inside the mouth -seizures -tremors -unusual bleeding or bruising -unusually weak or tired -yellowing of the eyes or skin Side effects that usually do not require medical attention (Report these to your doctor or health care professional if they continue or are bothersome.): -constipation -diarrhea -dry mouth -headache -nausea This list may not describe all possible side effects. Call your doctor for medical advice about side effects. You may report side effects to FDA at 1-800-FDA-1088. Where should I keep my medicine? Keep out of the reach of children. Store at room temperature between 15 and 30 degrees C (59 and 86 degrees F). Protect from light   and moisture. Throw away any unused medicine after the expiration date. NOTE: This sheet is a summary. It may  not cover all possible information. If you have questions about this medicine, talk to your doctor, pharmacist, or health care provider.    2016, Elsevier/Gold Standard. (2014-12-18 14:45:56)  

## 2016-06-30 NOTE — Discharge Summary (Signed)
Physician Discharge Summary  Julia Wheeler:[id number] DOB: 1966-03-05 DOA: 06/26/2016  PCP: Zachery Dauer, FNP  Admit date: 06/26/2016 Discharge date: 06/30/2016  Recommendations for Outpatient Follow-up:  1. Check CBC during follow-up with primary care physician in the next week or so after the discharge  Discharge Diagnoses:  Principal Problem:   UGI bleed Active Problems:   Intussusception of intestine - physiologic & self-resolved   Essential hypertension   Anemia, iron deficiency   PTSD (post-traumatic stress disorder)   Chronic left systolic heart failure (HCC)    Discharge Condition: stable   Diet recommendation: as tolerated   History of present illness:  50 y.o. female with history of gastric bypass surgery done in 2005, chronic systolic heart failure EF 40% (follows with [facility]), PTSD, migraine headaches, essential hypertension, iron deficiency anemia who presented with concerns for ongoing diarrhea for past 1 month prior to this admission, unintentional weight loss of about 10 pounds over past 1 month prior to this admission and history of 2 days of bright red blood per rectum associated with mild nausea and epigastric discomfort. Patient underwent EGD 06/27/2016 with findings significant for non-bleeding gastric ulcers with visible vessel. Hospital Course:    Assessment & Plan:   Acute upper GI bleed / acute blood loss anemia - Likely secondary to nonbleeding gastric ulcers - EGD done 06/27/2016 with none of bleeding visible gastric ulcers treated with cautery - Patient is status post 1 unit of PRBC transfusion so far - Her hemoglobin is 8.1 today, stable - No reports of bleeding  - Continue Protonix   Chronic systolic CHF - Compensated  Essential Hypertension - Continue carvedilol 6.25 mg twice daily   PTSD / Depression / Anxiety - Continue Klonipin, Zoloft, Zyprexa and trazodone  Hyperlipidemia - Continue statin therapy      DVT prophylaxis: SCDs due to risk of bleeding Code Status: full code  Family Communication: No family at bedside    Consultants:   GI, Dr. Jeani Hawking   Procedures:   EGD 06/27/2016 - 3 cm hiatal hernia, nonbleeding gastric ulcers with visible vessel treated with bipolar cautery  1 U PRBC transfusion 06/29/2016   Antimicrobials:   None    Signed:  Manson Passey, MD  [facility] 06/30/2016, 11:40 AM  Pager #: [phone]  Time spent in minutes: less than 30 minutes   Discharge Exam: Vitals:   06/29/16 2139 06/30/16 0549  BP: 110/80 108/67  Pulse: 79 75  Resp: 18 20  Temp: 98.5 F (36.9 C) 98.4 F (36.9 C)   Vitals:   06/29/16 1200 06/29/16 1338 06/29/16 2139 06/30/16 0549  BP: (!) 103/58 107/74 110/80 108/67  Pulse: 72 70 79 75  Resp: 12 14 18 20   Temp: 98.6 F (37 C) 98.7 F (37.1 C) 98.5 F (36.9 C) 98.4 F (36.9 C)  TempSrc: Oral Oral Oral Oral  SpO2: 93% 98% 100% 96%  Weight:      Height:        General: Pt is alert, follows commands appropriately, not in acute distress Cardiovascular: Regular rate and rhythm, S1/S2 +, no murmurs Respiratory: Clear to auscultation bilaterally, no wheezing, no crackles, no rhonchi Abdominal: Soft, non tender, non distended, bowel sounds +, no guarding Extremities: no edema, no cyanosis, pulses palpable bilaterally DP and PT Neuro: Grossly nonfocal  Discharge Instructions  Discharge Instructions    Call MD for:  difficulty breathing, headache or visual disturbances    Complete by:  As directed  Call MD for:  hives    Complete by:  As directed   Call MD for:  redness, tenderness, or signs of infection (pain, swelling, redness, odor or green/yellow discharge around incision site)    Complete by:  As directed   Call MD for:  severe uncontrolled pain    Complete by:  As directed   Diet - low sodium heart healthy    Complete by:  As directed   Increase activity slowly    Complete by:  As  directed       Medication List    TAKE these medications   amLODipine 10 MG tablet Commonly known as:  NORVASC Take 10 mg by mouth daily.   atorvastatin 10 MG tablet Commonly known as:  LIPITOR Take 1 tablet (10 mg total) by mouth daily.   b complex vitamins capsule Take 1 capsule by mouth daily.   carvedilol 6.25 MG tablet Commonly known as:  COREG Take 1 tablet (6.25 mg total) by mouth 2 (two) times daily.   cetirizine 10 MG tablet Commonly known as:  ZYRTEC Take 10 mg by mouth daily.   clonazePAM 0.5 MG tablet Commonly known as:  KLONOPIN Take 0.5 mg by mouth 2 (two) times daily.   doxepin 10 MG capsule Commonly known as:  SINEQUAN Take 2 capsules (20 mg total) by mouth at bedtime. What changed:  how much to take   OLANZapine 2.5 MG tablet Commonly known as:  ZYPREXA Take 1 tablet (2.5 mg total) by mouth at bedtime.   pantoprazole 40 MG tablet Commonly known as:  PROTONIX Take 1 tablet (40 mg total) by mouth daily.   sertraline 100 MG tablet Commonly known as:  ZOLOFT Take 1 tablet (100 mg total) by mouth daily.   traZODone 50 MG tablet Commonly known as:  DESYREL Take 50 mg by mouth at bedtime.   triamterene-hydrochlorothiazide 37.5-25 MG tablet Commonly known as:  MAXZIDE-25 Take 1.5 tablets by mouth daily. What changed:  how much to take   ZZZQUIL 25 MG Caps Generic drug:  DiphenhydrAMINE HCl (Sleep) Take 2 capsules by mouth once.      Follow-up Information    Zachery Dauer, FNP. Schedule an appointment as soon as possible for a visit in 1 week(s).   Specialty:  Nurse Practitioner Contact information: [address] [place] [address] [phone]            The results of significant diagnostics from this hospitalization (including imaging, microbiology, ancillary and laboratory) are listed below for reference.    Significant Diagnostic Studies: Ct Abdomen Pelvis W Contrast  Result Date: 06/26/2016 CLINICAL DATA:  Left  abdominal pain x3 days, rectal bleeding, unintentional weight loss. History of gastric bypass and small bowel resection EXAM: CT ABDOMEN AND PELVIS WITH CONTRAST TECHNIQUE: Multidetector CT imaging of the abdomen and pelvis was performed using the standard protocol following bolus administration of intravenous contrast. CONTRAST:  ISOVUE-300 IOPAMIDOL (ISOVUE-300) INJECTION 61% COMPARISON:  04/07/2016 FINDINGS: Lower chest:  Lung bases are clear. Hepatobiliary: Liver is within normal limits. Layering gallstones (series 2/ image 29), without associated inflammatory changes. No intrahepatic or extrahepatic ductal dilatation. Pancreas: Within normal limits. Spleen: Within normal limits. Adrenals/Urinary Tract: Adrenal glands are within normal limits. 6 mm cyst in the lateral left lower kidney (series 2/ image 27). 4 mm cyst in the medial right lower kidney (series 2/ image 32). No hydronephrosis. Bladder is within normal limits. Stomach/Bowel: Postsurgical changes related to gastric bypass. Patent gastrojejunostomy. No evidence  of bowel obstruction. Normal appendix (series 2/ image 63). Vascular/Lymphatic: No evidence of abdominal aortic aneurysm. No suspicious abdominopelvic lymphadenopathy. Reproductive: Uterus is notable for a 1.6 cm intramural fibroid in the anterior uterine fundus (series 2/image 65). Bilateral ovaries are unremarkable. Other: No abdominopelvic ascites. Musculoskeletal: Visualized osseous structures are within normal limits. IMPRESSION: Status post gastric bypass with patent gastrojejunostomy. No evidence of bowel obstruction. No CT findings to account for the patient's left abdominal pain. Electronically Signed   By: Charline BillsSriyesh  Krishnan M.D.   On: 06/26/2016 10:44    Microbiology: Recent Results (from the past 240 hour(s))  MRSA PCR Screening     Status: None   Collection Time: 06/26/16  4:29 PM  Result Value Ref Range Status   MRSA by PCR NEGATIVE NEGATIVE Final    Comment:         The GeneXpert MRSA Assay (FDA approved for NASAL specimens only), is one component of a comprehensive MRSA colonization surveillance program. It is not intended to diagnose MRSA infection nor to guide or monitor treatment for MRSA infections.      Labs: Basic Metabolic Panel:  Recent Labs Lab 06/26/16 0734 06/26/16 0752 06/27/16 0318 06/29/16 0341  NA 137 139 139 140  K 3.8 3.9 3.4* 3.7  CL 101 100* 106 106  CO2 28  --  27 28  GLUCOSE 100* 96 99 87  BUN 35* 33* 18 10  CREATININE 0.76 0.80 0.76 0.99  CALCIUM 9.0  --  8.5* 8.3*   Liver Function Tests:  Recent Labs Lab 06/26/16 0734  AST 28  ALT 17  ALKPHOS 75  BILITOT 0.6  PROT 6.7  ALBUMIN 4.0   No results for input(s): LIPASE, AMYLASE in the last 168 hours. No results for input(s): AMMONIA in the last 168 hours. CBC:  Recent Labs Lab 06/27/16 1002 06/27/16 1910 06/28/16 0305 06/28/16 1059 06/29/16 0341 06/30/16 0415  WBC 4.6 5.2 4.0  --  4.2 4.5  HGB 7.3* 7.0* 6.7* 7.8* 7.8* 8.1*  HCT 22.7* 21.6* 20.7* 23.9* 23.9* 24.8*  MCV 80.8 81.8 81.8  --  83.9 82.4  PLT 135* 132* 133*  --  143* 148*   Cardiac Enzymes: No results for input(s): CKTOTAL, CKMB, CKMBINDEX, TROPONINI in the last 168 hours. BNP: BNP (last 3 results) No results for input(s): BNP in the last 8760 hours.  ProBNP (last 3 results) No results for input(s): PROBNP in the last 8760 hours.  CBG: No results for input(s): GLUCAP in the last 168 hours.

## 2016-09-27 ENCOUNTER — Ambulatory Visit (INDEPENDENT_AMBULATORY_CARE_PROVIDER_SITE_OTHER): Payer: Medicaid Other | Admitting: Cardiology

## 2016-09-27 ENCOUNTER — Encounter (INDEPENDENT_AMBULATORY_CARE_PROVIDER_SITE_OTHER): Payer: Self-pay

## 2016-09-27 ENCOUNTER — Encounter: Payer: Self-pay | Admitting: Cardiology

## 2016-09-27 VITALS — BP 120/92 | HR 62 | Ht 68.0 in | Wt 172.6 lb

## 2016-09-27 DIAGNOSIS — I428 Other cardiomyopathies: Secondary | ICD-10-CM
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
## 2016-09-27 NOTE — Patient Instructions (Signed)

## 2016-09-28 ENCOUNTER — Other Ambulatory Visit: Payer: Self-pay | Admitting: Sports Medicine

## 2016-09-28 DIAGNOSIS — M25531 Pain in right wrist: Secondary | ICD-10-CM

## 2016-10-12 ENCOUNTER — Other Ambulatory Visit: Payer: Self-pay

## 2016-10-19 ENCOUNTER — Ambulatory Visit
Admission: RE | Admit: 2016-10-19 | Discharge: 2016-10-19 | Disposition: A | Discharge status: Home / Self Care | Payer: Medicaid Other | Source: Ambulatory Visit | Attending: Sports Medicine | Admitting: Sports Medicine

## 2016-10-19 ENCOUNTER — Encounter: Payer: Self-pay | Admitting: Radiology

## 2016-10-19 DIAGNOSIS — M25531 Pain in right wrist: Secondary | ICD-10-CM

## 2016-10-23 ENCOUNTER — Other Ambulatory Visit: Payer: Self-pay | Admitting: Nurse Practitioner

## 2016-10-23 DIAGNOSIS — Z1231 Encounter for screening mammogram for malignant neoplasm of breast: Secondary | ICD-10-CM

## 2016-10-23 DIAGNOSIS — N951 Menopausal and female climacteric states: Secondary | ICD-10-CM

## 2016-10-31 ENCOUNTER — Ambulatory Visit
Admission: RE | Admit: 2016-10-31 | Discharge: 2016-10-31 | Disposition: A | Discharge status: Home / Self Care | Payer: Medicaid Other | Source: Ambulatory Visit | Attending: Nurse Practitioner | Admitting: Nurse Practitioner

## 2016-10-31 DIAGNOSIS — N951 Menopausal and female climacteric states: Secondary | ICD-10-CM

## 2016-11-04 ENCOUNTER — Emergency Department (HOSPITAL_COMMUNITY): Payer: No Typology Code available for payment source

## 2016-11-04 ENCOUNTER — Emergency Department (HOSPITAL_COMMUNITY)
Admission: EM | Admit: 2016-11-04 | Discharge: 2016-11-04 | Disposition: A | Discharge status: Home / Self Care | Payer: No Typology Code available for payment source | Attending: Emergency Medicine | Admitting: Emergency Medicine

## 2016-11-04 ENCOUNTER — Encounter (HOSPITAL_COMMUNITY): Payer: Self-pay | Admitting: Emergency Medicine

## 2016-11-04 DIAGNOSIS — T50905A Adverse effect of unspecified drugs, medicaments and biological substances, initial encounter: Secondary | ICD-10-CM

## 2016-11-04 DIAGNOSIS — T148XXA Other injury of unspecified body region, initial encounter: Secondary | ICD-10-CM

## 2016-11-04 DIAGNOSIS — T887XXA Unspecified adverse effect of drug or medicament, initial encounter: Secondary | ICD-10-CM | POA: Insufficient documentation

## 2016-11-04 DIAGNOSIS — Y939 Activity, unspecified: Secondary | ICD-10-CM | POA: Diagnosis not present

## 2016-11-04 DIAGNOSIS — Y638 Failure in dosage during other surgical and medical care: Secondary | ICD-10-CM | POA: Insufficient documentation

## 2016-11-04 DIAGNOSIS — Y999 Unspecified external cause status: Secondary | ICD-10-CM | POA: Diagnosis not present

## 2016-11-04 DIAGNOSIS — T43215A Adverse effect of selective serotonin and norepinephrine reuptake inhibitors, initial encounter: Secondary | ICD-10-CM | POA: Insufficient documentation

## 2016-11-04 DIAGNOSIS — S060X0A Concussion without loss of consciousness, initial encounter: Secondary | ICD-10-CM | POA: Diagnosis not present

## 2016-11-04 DIAGNOSIS — Y92410 Unspecified street and highway as the place of occurrence of the external cause: Secondary | ICD-10-CM | POA: Insufficient documentation

## 2016-11-04 DIAGNOSIS — I110 Hypertensive heart disease with heart failure: Secondary | ICD-10-CM | POA: Insufficient documentation

## 2016-11-04 DIAGNOSIS — S161XXA Strain of muscle, fascia and tendon at neck level, initial encounter: Secondary | ICD-10-CM | POA: Insufficient documentation

## 2016-11-04 DIAGNOSIS — I5022 Chronic systolic (congestive) heart failure: Secondary | ICD-10-CM | POA: Insufficient documentation

## 2016-11-04 DIAGNOSIS — S0990XA Unspecified injury of head, initial encounter: Secondary | ICD-10-CM | POA: Diagnosis present

## 2016-11-04 LAB — I-STAT TROPONIN, ED: TROPONIN I, POC: 0 ng/mL (ref 0.00–0.08)

## 2016-11-04 LAB — COMPREHENSIVE METABOLIC PANEL
ALK PHOS: 67 U/L (ref 38–126)
ALT: 18 U/L (ref 14–54)
ANION GAP: 10 (ref 5–15)
AST: 24 U/L (ref 15–41)
Albumin: 4 g/dL (ref 3.5–5.0)
BILIRUBIN TOTAL: 0.6 mg/dL (ref 0.3–1.2)
BUN: 16 mg/dL (ref 6–20)
CALCIUM: 9.8 mg/dL (ref 8.9–10.3)
CO2: 30 mmol/L (ref 22–32)
Chloride: 101 mmol/L (ref 101–111)
Creatinine, Ser: 0.87 mg/dL (ref 0.44–1.00)
GFR calc non Af Amer: >60 mL/min (ref >60)
Glucose, Bld: 89 mg/dL (ref 65–99)
POTASSIUM: 4 mmol/L (ref 3.5–5.1)
SODIUM: 141 mmol/L (ref 135–145)
TOTAL PROTEIN: 7.6 g/dL (ref 6.5–8.1)

## 2016-11-04 LAB — DIFFERENTIAL
Basophils Absolute: 0 10*3/uL (ref 0.0–0.1)
Basophils Relative: 1 %
EOS PCT: 1 %
Eosinophils Absolute: 0 10*3/uL (ref 0.0–0.7)
LYMPHS ABS: 1.6 10*3/uL (ref 0.7–4.0)
Lymphocytes Relative: 33 %
MONO ABS: 0.4 10*3/uL (ref 0.1–1.0)
MONOS PCT: 9 %
NEUTROS ABS: 2.7 10*3/uL (ref 1.7–7.7)
NEUTROS PCT: 56 %

## 2016-11-04 LAB — CBC
HCT: 30.1 % — ABNORMAL LOW (ref 36.0–46.0)
HEMOGLOBIN: 9.2 g/dL — AB (ref 12.0–15.0)
MCH: 22.4 pg — AB (ref 26.0–34.0)
MCHC: 30.6 g/dL (ref 30.0–36.0)
MCV: 73.4 fL — ABNORMAL LOW (ref 78.0–100.0)
Platelets: 279 10*3/uL (ref 150–400)
RBC: 4.1 MIL/uL (ref 3.87–5.11)
RDW: 19.8 % — AB (ref 11.5–15.5)
WBC: 4.7 10*3/uL (ref 4.0–10.5)

## 2016-11-04 LAB — I-STAT CHEM 8, ED
BUN: 19 mg/dL (ref 6–20)
CALCIUM ION: 1.2 mmol/L (ref 1.15–1.40)
CREATININE: 0.9 mg/dL (ref 0.44–1.00)
Chloride: 101 mmol/L (ref 101–111)
GLUCOSE: 86 mg/dL (ref 65–99)
HCT: 31 % — ABNORMAL LOW (ref 36.0–46.0)
Hemoglobin: 10.5 g/dL — ABNORMAL LOW (ref 12.0–15.0)
Potassium: 4 mmol/L (ref 3.5–5.1)
Sodium: 141 mmol/L (ref 135–145)
TCO2: 31 mmol/L (ref 0–100)

## 2016-11-04 LAB — CBG MONITORING, ED: GLUCOSE-CAPILLARY: 84 mg/dL (ref 65–99)

## 2016-11-04 LAB — APTT: APTT: 31 s (ref 24–36)

## 2016-11-04 LAB — PROTIME-INR
INR: 0.99
Prothrombin Time: 13.1 seconds (ref 11.4–15.2)

## 2016-11-04 MED ORDER — ACETAMINOPHEN 500 MG PO TABS
1000.0000 mg | ORAL_TABLET | Freq: Once | ORAL | Status: AC
  Administered 2016-11-04: 1000 mg via ORAL
  Filled 2016-11-04: qty 2

## 2016-11-04 NOTE — ED Notes (Signed)
CBG 84. 
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
## 2016-11-17 ENCOUNTER — Ambulatory Visit: Payer: Self-pay

## 2016-12-11 ENCOUNTER — Ambulatory Visit: Payer: Self-pay

## 2016-12-25 ENCOUNTER — Encounter: Payer: Self-pay | Admitting: Psychology

## 2017-01-02 ENCOUNTER — Emergency Department (HOSPITAL_COMMUNITY)
Admission: EM | Admit: 2017-01-02 | Discharge: 2017-01-02 | Disposition: A | Discharge status: Home / Self Care | Payer: Medicaid Other | Attending: Emergency Medicine | Admitting: Emergency Medicine

## 2017-01-02 ENCOUNTER — Encounter (HOSPITAL_COMMUNITY): Payer: Self-pay | Admitting: Emergency Medicine

## 2017-01-02 ENCOUNTER — Emergency Department (HOSPITAL_COMMUNITY): Payer: Medicaid Other

## 2017-01-02 DIAGNOSIS — R1013 Epigastric pain: Secondary | ICD-10-CM | POA: Diagnosis not present

## 2017-01-02 DIAGNOSIS — I5022 Chronic systolic (congestive) heart failure: Secondary | ICD-10-CM | POA: Diagnosis not present

## 2017-01-02 DIAGNOSIS — R079 Chest pain, unspecified: Secondary | ICD-10-CM

## 2017-01-02 DIAGNOSIS — Z79899 Other long term (current) drug therapy: Secondary | ICD-10-CM | POA: Insufficient documentation

## 2017-01-02 DIAGNOSIS — R072 Precordial pain: Secondary | ICD-10-CM | POA: Diagnosis present

## 2017-01-02 DIAGNOSIS — I110 Hypertensive heart disease with heart failure: Secondary | ICD-10-CM | POA: Insufficient documentation

## 2017-01-02 LAB — BASIC METABOLIC PANEL
Anion gap: 11 (ref 5–15)
BUN: 16 mg/dL (ref 6–20)
CHLORIDE: 100 mmol/L — AB (ref 101–111)
CO2: 29 mmol/L (ref 22–32)
CREATININE: 0.85 mg/dL (ref 0.44–1.00)
Calcium: 9.5 mg/dL (ref 8.9–10.3)
GFR calc Af Amer: >60 mL/min (ref >60)
GFR calc non Af Amer: >60 mL/min (ref >60)
Glucose, Bld: 89 mg/dL (ref 65–99)
POTASSIUM: 4 mmol/L (ref 3.5–5.1)
SODIUM: 140 mmol/L (ref 135–145)

## 2017-01-02 LAB — HEPATIC FUNCTION PANEL
ALK PHOS: 80 U/L (ref 38–126)
ALT: 14 U/L (ref 14–54)
AST: 23 U/L (ref 15–41)
Albumin: 4.2 g/dL (ref 3.5–5.0)
Bilirubin, Direct: 0.1 mg/dL (ref 0.1–0.5)
Indirect Bilirubin: 0.5 mg/dL (ref 0.3–0.9)
TOTAL PROTEIN: 8 g/dL (ref 6.5–8.1)
Total Bilirubin: 0.6 mg/dL (ref 0.3–1.2)

## 2017-01-02 LAB — URINALYSIS, ROUTINE W REFLEX MICROSCOPIC
Bilirubin Urine: NEGATIVE
Glucose, UA: NEGATIVE mg/dL
Hgb urine dipstick: NEGATIVE
Ketones, ur: 20 mg/dL — AB
Leukocytes, UA: NEGATIVE
NITRITE: NEGATIVE
PH: 5 (ref 5.0–8.0)
Protein, ur: NEGATIVE mg/dL
SPECIFIC GRAVITY, URINE: 1.014 (ref 1.005–1.030)

## 2017-01-02 LAB — CBC
HEMATOCRIT: 29.1 % — AB (ref 36.0–46.0)
Hemoglobin: 9.2 g/dL — ABNORMAL LOW (ref 12.0–15.0)
MCH: 23.8 pg — AB (ref 26.0–34.0)
MCHC: 31.6 g/dL (ref 30.0–36.0)
MCV: 75.2 fL — AB (ref 78.0–100.0)
PLATELETS: 282 10*3/uL (ref 150–400)
RBC: 3.87 MIL/uL (ref 3.87–5.11)
RDW: 22.6 % — AB (ref 11.5–15.5)
WBC: 5.6 10*3/uL (ref 4.0–10.5)

## 2017-01-02 LAB — I-STAT TROPONIN, ED: Troponin i, poc: 0 ng/mL (ref 0.00–0.08)

## 2017-01-02 LAB — LIPASE, BLOOD: LIPASE: 24 U/L (ref 11–51)

## 2017-01-02 MED ORDER — SODIUM CHLORIDE 0.9 % IV BOLUS (SEPSIS)
1000.0000 mL | Freq: Once | INTRAVENOUS | Status: AC
  Administered 2017-01-02: 1000 mL via INTRAVENOUS

## 2017-01-02 MED ORDER — FAMOTIDINE IN NACL 20-0.9 MG/50ML-% IV SOLN
20.0000 mg | Freq: Once | INTRAVENOUS | Status: AC
  Administered 2017-01-02: 20 mg via INTRAVENOUS
  Filled 2017-01-02: qty 50

## 2017-01-02 MED ORDER — DICYCLOMINE HCL 20 MG PO TABS: 20.0000 mg | ORAL_TABLET | Freq: Two times a day (BID) | ORAL | 0 refills | Status: DC

## 2017-01-02 MED ORDER — GI COCKTAIL ~~LOC~~
30.0000 mL | Freq: Once | ORAL | Status: AC
  Administered 2017-01-02: 30 mL via ORAL
  Filled 2017-01-02: qty 30

## 2017-01-02 NOTE — ED Notes (Signed)
ED Provider at bedside. 

## 2017-01-02 NOTE — ED Triage Notes (Signed)
Patient states that she has ulcers that were bleeding recently and taking antibiotics. patient reports having allergic reaction with facial swelling and itching to antibiotics starting on Saturday. patient reports taking benadryl and helped some with swelling but still has some to lip ara.  Patient also reports having mid-to right chest pain that has been going on for 3-4 days. Patient has HTn and high cholesterol.

## 2017-01-02 NOTE — ED Notes (Signed)
Unable to collect labs I called patient name and no response.
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
## 2017-01-02 NOTE — ED Notes (Signed)
I attempted twice to collect labs and was unsuccessful 

## 2017-01-02 NOTE — Discharge Instructions (Signed)
Take your medication as prescribed as needed for abdominal pain. Continue taking your home prescription of pantoprazole as prescribed for your stomach ulcers. Continue drinking fluids at home to remain hydrated. I recommend scheduling a follow up appointment with her cardiologist within the next week for follow-up evaluation regarding your chest pain. I also recommend calling the gastroenterology clinic listed below to schedule a follow-up appointment for further management of your abdominal pain and peptic ulcer disease Please return to the Emergency Department if symptoms worsen or new onset of fever, new/worsening chest pain, difficulty breathing, palpitations, worsening abdominal pain, vomiting, rectal bleeding, lightheadedness, syncope.

## 2017-02-15 ENCOUNTER — Ambulatory Visit
Admission: RE | Admit: 2017-02-15 | Discharge: 2017-02-15 | Disposition: A | Discharge status: Home / Self Care | Payer: Medicaid Other | Source: Ambulatory Visit | Attending: Nurse Practitioner | Admitting: Nurse Practitioner

## 2017-02-15 DIAGNOSIS — Z1231 Encounter for screening mammogram for malignant neoplasm of breast: Secondary | ICD-10-CM

## 2017-03-05 ENCOUNTER — Telehealth: Payer: Self-pay

## 2017-03-05 NOTE — Telephone Encounter (Signed)
The pt has never been seen here, her last and most recent visit was with Dr Elnoria Howard.  Left message on machine to call back

## 2017-03-06 NOTE — Telephone Encounter (Signed)
Left message on machine to call back  

## 2017-03-07 NOTE — Telephone Encounter (Signed)
Left message on machine to call back will wait for further communication from the pt  

## 2017-03-14 ENCOUNTER — Encounter: Payer: Self-pay | Admitting: Gastroenterology

## 2017-03-16 ENCOUNTER — Other Ambulatory Visit (HOSPITAL_BASED_OUTPATIENT_CLINIC_OR_DEPARTMENT_OTHER): Payer: Self-pay

## 2017-03-16 DIAGNOSIS — G4730 Sleep apnea, unspecified: Secondary | ICD-10-CM

## 2017-03-27 ENCOUNTER — Ambulatory Visit (INDEPENDENT_AMBULATORY_CARE_PROVIDER_SITE_OTHER): Payer: Medicaid Other | Admitting: Cardiology

## 2017-03-27 ENCOUNTER — Encounter: Payer: Self-pay | Admitting: Cardiology

## 2017-03-27 VITALS — BP 120/84 | HR 84 | Ht 68.0 in | Wt 170.8 lb

## 2017-03-27 DIAGNOSIS — I428 Other cardiomyopathies: Secondary | ICD-10-CM | POA: Diagnosis not present

## 2017-03-27 DIAGNOSIS — I10 Essential (primary) hypertension: Secondary | ICD-10-CM
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
## 2017-03-27 NOTE — Patient Instructions (Signed)
Medication Instructions:    Your physician recommends that you continue on your current medications as directed. Please refer to the Current Medication list given to you today.  Labwork:  None ordered  Testing/Procedures:  None ordered  Follow-Up:  Your physician wants you to follow-up in: 6 months with Dr. Camnitz.  You will receive a reminder letter in the mail two months in advance. If you don't receive a letter, please call our office to schedule the follow-up appointment.  - If you need a refill on your cardiac medications before your next appointment, please call your pharmacy.    Thank you for choosing [facility]!!   Ashawna Hanback, RN [phone]         

## 2017-04-20 ENCOUNTER — Other Ambulatory Visit (INDEPENDENT_AMBULATORY_CARE_PROVIDER_SITE_OTHER): Payer: Self-pay

## 2017-04-20 ENCOUNTER — Other Ambulatory Visit: Payer: Self-pay

## 2017-04-20 ENCOUNTER — Ambulatory Visit (INDEPENDENT_AMBULATORY_CARE_PROVIDER_SITE_OTHER): Payer: Medicaid Other | Admitting: Gastroenterology

## 2017-04-20 ENCOUNTER — Encounter: Payer: Self-pay | Admitting: Gastroenterology

## 2017-04-20 VITALS — BP 114/82 | HR 88 | Ht 66.5 in | Wt 166.5 lb

## 2017-04-20 DIAGNOSIS — R195 Other fecal abnormalities: Secondary | ICD-10-CM | POA: Diagnosis not present

## 2017-04-20 DIAGNOSIS — R109 Unspecified abdominal pain: Secondary | ICD-10-CM

## 2017-04-20 DIAGNOSIS — Z8711 Personal history of peptic ulcer disease: Secondary | ICD-10-CM

## 2017-04-20 DIAGNOSIS — Z8719 Personal history of other diseases of the digestive system: Secondary | ICD-10-CM | POA: Diagnosis not present

## 2017-04-20 DIAGNOSIS — K625 Hemorrhage of anus and rectum: Secondary | ICD-10-CM | POA: Diagnosis not present

## 2017-04-20 DIAGNOSIS — D649 Anemia, unspecified: Secondary | ICD-10-CM

## 2017-04-20 DIAGNOSIS — K561 Intussusception: Secondary | ICD-10-CM

## 2017-04-20 DIAGNOSIS — R1012 Left upper quadrant pain: Secondary | ICD-10-CM | POA: Diagnosis not present

## 2017-04-20 LAB — CBC WITH DIFFERENTIAL/PLATELET
BASOS ABS: 0.1 10*3/uL (ref 0.0–0.1)
Basophils Relative: 0.8 % (ref 0.0–3.0)
Eosinophils Absolute: 0 10*3/uL (ref 0.0–0.7)
Eosinophils Relative: 0.6 % (ref 0.0–5.0)
HCT: 22.9 % — CL (ref 36.0–46.0)
Hemoglobin: 7.1 g/dL — CL (ref 12.0–15.0)
LYMPHS ABS: 1.7 10*3/uL (ref 0.7–4.0)
Lymphocytes Relative: 25.4 % (ref 12.0–46.0)
MCHC: 30.8 g/dL (ref 30.0–36.0)
MCV: 65.1 fl — AB (ref 78.0–100.0)
MONO ABS: 0.7 10*3/uL (ref 0.1–1.0)
MONOS PCT: 10.1 % (ref 3.0–12.0)
NEUTROS ABS: 4.2 10*3/uL (ref 1.4–7.7)
NEUTROS PCT: 63.1 % (ref 43.0–77.0)
PLATELETS: 264 10*3/uL (ref 150.0–400.0)
RBC: 3.52 Mil/uL — ABNORMAL LOW (ref 3.87–5.11)
RDW: 19.3 % — ABNORMAL HIGH (ref 11.5–15.5)
WBC: 6.7 10*3/uL (ref 4.0–10.5)

## 2017-04-20 MED ORDER — NA SULFATE-K SULFATE-MG SULF 17.5-3.13-1.6 GM/177ML PO SOLN: 1.0000 | Freq: Once | ORAL | 0 refills | Status: AC

## 2017-04-20 NOTE — Patient Instructions (Addendum)
You will have labs checked today in the basement lab.  Please head down after you check out with the front desk  (cbc).  You will be set up for a CTenterography: for epigastric pain, h/o small bowel intussception on previous CT, ? For small bowel lead tumors.  You have been scheduled for a CT scan of the abdomen and pelvis at [facility] ([address]---this is in the same building as Press photographer).   You are scheduled on 04/27/17 at 10 AM. You should arrive at 8:45 am for registration. Please follow the written instructions below on the day of your exam:  Nothing to eat or drink after midnight   WARNING: IF YOU ARE ALLERGIC TO IODINE/X-RAY DYE, PLEASE NOTIFY RADIOLOGY IMMEDIATELY AT [phone]! YOU WILL BE GIVEN A 13 HOUR PREMEDICATION PREP.   You may take any medications as prescribed with a small amount of water except for the following: Metformin, Glucophage, Glucovance, Avandamet, Riomet, Fortamet, Actoplus Met, Janumet, Glumetza or Metaglip. The above medications must be held the day of the exam AND 48 hours after the exam.  The purpose of you drinking the oral contrast is to aid in the visualization of your intestinal tract. The contrast solution may cause some diarrhea. Before your exam is started, you will be given a small amount of fluid to drink. Depending on your individual set of symptoms, you may also receive an intravenous injection of x-ray contrast/dye. Plan on being at [facility] for 30 minutes or longer, depending on the type of exam you are having performed.  This test typically takes 30-45 minutes to complete.  If you have any questions regarding your exam or if you need to reschedule, you may call the CT department at [phone] between the hours of 8:00 am and 5:00 pm, Monday-Friday.  ________________________________________________________   Dennis Bast will be set up for a colonoscopy for rectal bleeding, heme + stools  You will be set up for  an upper endoscopy for LUQ pain, h/o gastric ulcers.  Please start taking citrucel (orange flavored) powder fiber supplement.  This may cause some bloating at first but that usually goes away. Begin with a small spoonful and work your way up to a large, heaping spoonful daily over a week.  You need to take fiber supplement (OTC) one pill once daily.
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
## 2017-04-27 ENCOUNTER — Ambulatory Visit (INDEPENDENT_AMBULATORY_CARE_PROVIDER_SITE_OTHER)
Admission: RE | Admit: 2017-04-27 | Discharge: 2017-04-27 | Disposition: A | Discharge status: Home / Self Care | Payer: Medicaid Other | Source: Ambulatory Visit | Attending: Gastroenterology | Admitting: Gastroenterology

## 2017-04-27 DIAGNOSIS — R195 Other fecal abnormalities: Secondary | ICD-10-CM

## 2017-04-27 DIAGNOSIS — R109 Unspecified abdominal pain: Secondary | ICD-10-CM | POA: Diagnosis not present

## 2017-04-27 DIAGNOSIS — Z8719 Personal history of other diseases of the digestive system: Secondary | ICD-10-CM

## 2017-04-27 DIAGNOSIS — R1012 Left upper quadrant pain: Secondary | ICD-10-CM

## 2017-04-27 DIAGNOSIS — K625 Hemorrhage of anus and rectum: Secondary | ICD-10-CM

## 2017-04-27 DIAGNOSIS — Z8711 Personal history of peptic ulcer disease: Secondary | ICD-10-CM

## 2017-04-27 MED ORDER — IOPAMIDOL (ISOVUE-300) INJECTION 61%
100.0000 mL | Freq: Once | INTRAVENOUS | Status: AC | PRN
  Administered 2017-04-27: 100 mL via INTRAVENOUS

## 2017-04-30 ENCOUNTER — Telehealth: Payer: Self-pay | Admitting: Gastroenterology

## 2017-04-30 NOTE — Telephone Encounter (Signed)
Left message for patient that it does not look like Dr. Christella Hartigan has had a chance to review the results yet. As soon as they are reviewed, she will be contacted.

## 2017-05-09 ENCOUNTER — Ambulatory Visit (HOSPITAL_BASED_OUTPATIENT_CLINIC_OR_DEPARTMENT_OTHER): Payer: Medicaid Other | Attending: Anesthesiology | Admitting: Internal Medicine

## 2017-05-09 DIAGNOSIS — R0683 Snoring: Secondary | ICD-10-CM | POA: Insufficient documentation

## 2017-05-09 DIAGNOSIS — G4730 Sleep apnea, unspecified: Secondary | ICD-10-CM

## 2017-05-13 DIAGNOSIS — G4730 Sleep apnea, unspecified: Secondary | ICD-10-CM

## 2017-05-13 NOTE — Procedures (Signed)
  Patient Name: Julia Wheeler, Julia Wheeler Study Date: 05/09/2017 Gender: Female D.O.B: [birth year] Age (years): 50 Referring Provider: Blenda Bridegroom Dakwa Height (inches): 68 Interpreting Physician: Jetty Duhamel MD, ABSM Weight (lbs): 170 RPSGT: Armen Pickup BMI: 26 MRN: [id number] Neck Size: 14.00 CLINICAL INFORMATION Sleep Study Type: NPSG  Indication for sleep study: Snoring  Epworth Sleepiness Score: 4  SLEEP STUDY TECHNIQUE As per the AASM Manual for the Scoring of Sleep and Associated Events v2.3 (April 2016) with a hypopnea requiring 4% desaturations.  The channels recorded and monitored were frontal, central and occipital EEG, electrooculogram (EOG), submentalis EMG (chin), nasal and oral airflow, thoracic and abdominal wall motion, anterior tibialis EMG, snore microphone, electrocardiogram, and pulse oximetry.  MEDICATIONS Medications self-administered by patient taken the night of the study : none reported  SLEEP ARCHITECTURE The study was initiated at 10:32:17 PM and ended at 4:31:07 AM.  Sleep onset time was 66.0 minutes and the sleep efficiency was 76.7%. The total sleep time was 275.4 minutes.  Stage REM latency was 122.5 minutes.  The patient spent 2.72% of the night in stage N1 sleep, 73.85% in stage N2 sleep, 0.00% in stage N3 and 23.42% in REM.  Alpha intrusion was absent.  Supine sleep was 41.92%.  RESPIRATORY PARAMETERS The overall apnea/hypopnea index (AHI) was 0.0 per hour. There were 0 total apneas, including 0 obstructive, 0 central and 0 mixed apneas. There were 0 hypopneas and 0 RERAs.  The AHI during Stage REM sleep was 0.0 per hour.  AHI while supine was 0.0 per hour.  The mean oxygen saturation was 96.05%. The minimum SpO2 during sleep was 92.00%.  Soft snoring was noted during this study.  CARDIAC DATA The 2 lead EKG demonstrated sinus rhythm. The mean heart rate was 81.60 beats per minute. Other EKG findings include: None.  LEG MOVEMENT DATA The  total PLMS were 0 with a resulting PLMS index of 0.00. Associated arousal with leg movement index was 0.0 .  IMPRESSIONS - No significant obstructive sleep apnea occurred during this study (AHI = 0.0/h). - No significant central sleep apnea occurred during this study (CAI = 0.0/h). - The patient had minimal or no oxygen desaturation during the study (Min O2 = 92.00%) - The patient snored with Soft snoring volume. - No cardiac abnormalities were noted during this study. - Clinically significant periodic limb movements did not occur during sleep. No significant associated arousals.  DIAGNOSIS - Normal study  RECOMMENDATIONS - Be careful with alcohol, sedatives and other CNS depressants that may worsen sleep apnea and disrupt normal sleep architecture. - Sleep hygiene should be reviewed to assess factors that may improve sleep quality. - Weight management and regular exercise should be initiated or continued if appropriate.  [Electronically signed] 05/13/2017 04:04 PM  Jetty Duhamel MD, ABSM Diplomate, American Board of Sleep Medicine   NPI: [id number]  Waymon Budge Diplomate, American Board of Sleep Medicine  ELECTRONICALLY SIGNED ON:  05/13/2017, 4:01 PM [facility] PH: [phone]   FX: [phone] ACCREDITED BY THE AMERICAN ACADEMY OF SLEEP MEDICINE

## 2017-05-14 ENCOUNTER — Other Ambulatory Visit: Payer: Self-pay | Admitting: Orthopedic Surgery

## 2017-05-17 ENCOUNTER — Other Ambulatory Visit: Payer: Self-pay | Admitting: Orthopedic Surgery

## 2017-05-22 ENCOUNTER — Emergency Department (HOSPITAL_COMMUNITY): Payer: Medicaid Other

## 2017-05-22 ENCOUNTER — Encounter (HOSPITAL_COMMUNITY): Payer: Self-pay | Admitting: Emergency Medicine

## 2017-05-22 ENCOUNTER — Telehealth: Payer: Self-pay | Admitting: *Deleted

## 2017-05-22 ENCOUNTER — Inpatient Hospital Stay (HOSPITAL_COMMUNITY)
Admission: EM | Admit: 2017-05-22 | Discharge: 2017-05-24 | DRG: 379 | Disposition: A | Discharge status: Home Health | Payer: Medicaid Other | Attending: Family Medicine | Admitting: Family Medicine

## 2017-05-22 DIAGNOSIS — Z9884 Bariatric surgery status: Secondary | ICD-10-CM

## 2017-05-22 DIAGNOSIS — R112 Nausea with vomiting, unspecified: Secondary | ICD-10-CM | POA: Diagnosis present

## 2017-05-22 DIAGNOSIS — I509 Heart failure, unspecified: Secondary | ICD-10-CM | POA: Diagnosis present

## 2017-05-22 DIAGNOSIS — K5790 Diverticulosis of intestine, part unspecified, without perforation or abscess without bleeding: Secondary | ICD-10-CM

## 2017-05-22 DIAGNOSIS — M1990 Unspecified osteoarthritis, unspecified site: Secondary | ICD-10-CM | POA: Diagnosis present

## 2017-05-22 DIAGNOSIS — I10 Essential (primary) hypertension: Secondary | ICD-10-CM | POA: Diagnosis present

## 2017-05-22 DIAGNOSIS — D649 Anemia, unspecified: Secondary | ICD-10-CM

## 2017-05-22 DIAGNOSIS — K529 Noninfective gastroenteritis and colitis, unspecified: Secondary | ICD-10-CM

## 2017-05-22 DIAGNOSIS — K625 Hemorrhage of anus and rectum: Secondary | ICD-10-CM

## 2017-05-22 DIAGNOSIS — F329 Major depressive disorder, single episode, unspecified: Secondary | ICD-10-CM | POA: Diagnosis present

## 2017-05-22 DIAGNOSIS — F419 Anxiety disorder, unspecified: Secondary | ICD-10-CM | POA: Diagnosis present

## 2017-05-22 DIAGNOSIS — K648 Other hemorrhoids: Secondary | ICD-10-CM | POA: Diagnosis present

## 2017-05-22 DIAGNOSIS — R1114 Bilious vomiting: Secondary | ICD-10-CM

## 2017-05-22 DIAGNOSIS — D573 Sickle-cell trait: Secondary | ICD-10-CM | POA: Diagnosis present

## 2017-05-22 DIAGNOSIS — R935 Abnormal findings on diagnostic imaging of other abdominal regions, including retroperitoneum: Secondary | ICD-10-CM

## 2017-05-22 DIAGNOSIS — K449 Diaphragmatic hernia without obstruction or gangrene: Secondary | ICD-10-CM | POA: Diagnosis present

## 2017-05-22 DIAGNOSIS — Z79899 Other long term (current) drug therapy: Secondary | ICD-10-CM

## 2017-05-22 DIAGNOSIS — R1319 Other dysphagia: Secondary | ICD-10-CM

## 2017-05-22 DIAGNOSIS — R42 Dizziness and giddiness: Secondary | ICD-10-CM

## 2017-05-22 DIAGNOSIS — G4733 Obstructive sleep apnea (adult) (pediatric): Secondary | ICD-10-CM | POA: Diagnosis present

## 2017-05-22 DIAGNOSIS — K5730 Diverticulosis of large intestine without perforation or abscess without bleeding: Secondary | ICD-10-CM | POA: Diagnosis present

## 2017-05-22 DIAGNOSIS — R1314 Dysphagia, pharyngoesophageal phase: Secondary | ICD-10-CM | POA: Diagnosis present

## 2017-05-22 DIAGNOSIS — D509 Iron deficiency anemia, unspecified: Secondary | ICD-10-CM | POA: Diagnosis present

## 2017-05-22 DIAGNOSIS — I110 Hypertensive heart disease with heart failure: Secondary | ICD-10-CM | POA: Diagnosis present

## 2017-05-22 DIAGNOSIS — E785 Hyperlipidemia, unspecified: Secondary | ICD-10-CM | POA: Diagnosis present

## 2017-05-22 DIAGNOSIS — F4310 Post-traumatic stress disorder, unspecified: Secondary | ICD-10-CM | POA: Diagnosis present

## 2017-05-22 DIAGNOSIS — K254 Chronic or unspecified gastric ulcer with hemorrhage: Principal | ICD-10-CM | POA: Diagnosis present

## 2017-05-22 DIAGNOSIS — R1310 Dysphagia, unspecified: Secondary | ICD-10-CM

## 2017-05-22 HISTORY — DX: Low back pain, unspecified: M54.50

## 2017-05-22 HISTORY — DX: Other intervertebral disc displacement, lumbar region: M51.26

## 2017-05-22 HISTORY — DX: Personal history of urinary calculi: Z87.442

## 2017-05-22 HISTORY — DX: Other chronic pain: G89.29

## 2017-05-22 HISTORY — DX: Low back pain: M54.5

## 2017-05-22 HISTORY — DX: Gastric ulcer, unspecified as acute or chronic, without hemorrhage or perforation: K25.9

## 2017-05-22 LAB — URINALYSIS, ROUTINE W REFLEX MICROSCOPIC
BILIRUBIN URINE: NEGATIVE
Glucose, UA: NEGATIVE mg/dL
HGB URINE DIPSTICK: NEGATIVE
KETONES UR: 5 mg/dL — AB
Leukocytes, UA: NEGATIVE
NITRITE: NEGATIVE
PH: 6 (ref 5.0–8.0)
Protein, ur: NEGATIVE mg/dL
Specific Gravity, Urine: >1.046 — ABNORMAL HIGH (ref 1.005–1.030)

## 2017-05-22 LAB — CBC
HCT: 26 % — ABNORMAL LOW (ref 36.0–46.0)
Hemoglobin: 7.5 g/dL — ABNORMAL LOW (ref 12.0–15.0)
MCH: 18.5 pg — ABNORMAL LOW (ref 26.0–34.0)
MCHC: 28.8 g/dL — ABNORMAL LOW (ref 30.0–36.0)
MCV: 64 fL — AB (ref 78.0–100.0)
PLATELETS: 299 10*3/uL (ref 150–400)
RBC: 4.06 MIL/uL (ref 3.87–5.11)
RDW: 19.3 % — AB (ref 11.5–15.5)
WBC: 5.9 10*3/uL (ref 4.0–10.5)

## 2017-05-22 LAB — PREPARE RBC (CROSSMATCH)

## 2017-05-22 LAB — COMPREHENSIVE METABOLIC PANEL
ALT: 22 U/L (ref 14–54)
AST: 34 U/L (ref 15–41)
Albumin: 4.1 g/dL (ref 3.5–5.0)
Alkaline Phosphatase: 113 U/L (ref 38–126)
Anion gap: 13 (ref 5–15)
BUN: 8 mg/dL (ref 6–20)
CHLORIDE: 100 mmol/L — AB (ref 101–111)
CO2: 24 mmol/L (ref 22–32)
CREATININE: 0.9 mg/dL (ref 0.44–1.00)
Calcium: 9.6 mg/dL (ref 8.9–10.3)
Glucose, Bld: 131 mg/dL — ABNORMAL HIGH (ref 65–99)
Potassium: 3.5 mmol/L (ref 3.5–5.1)
Sodium: 137 mmol/L (ref 135–145)
TOTAL PROTEIN: 7.8 g/dL (ref 6.5–8.1)
Total Bilirubin: 1 mg/dL (ref 0.3–1.2)

## 2017-05-22 LAB — LIPASE, BLOOD: LIPASE: 23 U/L (ref 11–51)

## 2017-05-22 MED ORDER — B COMPLEX-C PO TABS
1.0000 | ORAL_TABLET | Freq: Every day | ORAL | Status: DC
  Administered 2017-05-23 – 2017-05-24 (×2): 1 via ORAL
  Filled 2017-05-22 (×2): qty 1

## 2017-05-22 MED ORDER — ONDANSETRON HCL 4 MG/2ML IJ SOLN
4.0000 mg | Freq: Once | INTRAMUSCULAR | Status: AC
  Administered 2017-05-22: 4 mg via INTRAVENOUS
  Filled 2017-05-22: qty 2

## 2017-05-22 MED ORDER — METRONIDAZOLE IN NACL 5-0.79 MG/ML-% IV SOLN
500.0000 mg | Freq: Three times a day (TID) | INTRAVENOUS | Status: DC
  Administered 2017-05-22 – 2017-05-24 (×6): 500 mg via INTRAVENOUS
  Filled 2017-05-22 (×5): qty 100

## 2017-05-22 MED ORDER — DOXEPIN HCL 10 MG PO CAPS
10.0000 mg | ORAL_CAPSULE | Freq: Every evening | ORAL | Status: DC | PRN
  Filled 2017-05-22: qty 1

## 2017-05-22 MED ORDER — SODIUM CHLORIDE 0.9 % IV BOLUS (SEPSIS)
1000.0000 mL | Freq: Once | INTRAVENOUS | Status: AC
  Administered 2017-05-22: 1000 mL via INTRAVENOUS

## 2017-05-22 MED ORDER — LORATADINE 10 MG PO TABS
10.0000 mg | ORAL_TABLET | Freq: Every day | ORAL | Status: DC
  Administered 2017-05-22 – 2017-05-24 (×3): 10 mg via ORAL
  Filled 2017-05-22 (×3): qty 1

## 2017-05-22 MED ORDER — SODIUM CHLORIDE 0.9 % IV SOLN
Freq: Once | INTRAVENOUS | Status: AC
  Administered 2017-05-23: 06:00:00 via INTRAVENOUS

## 2017-05-22 MED ORDER — SODIUM CHLORIDE 0.9% FLUSH
3.0000 mL | Freq: Two times a day (BID) | INTRAVENOUS | Status: DC
  Administered 2017-05-22 – 2017-05-24 (×4): 3 mL via INTRAVENOUS

## 2017-05-22 MED ORDER — VITAMIN D (ERGOCALCIFEROL) 1.25 MG (50000 UNIT) PO CAPS: 50000.0000 [IU] | ORAL_CAPSULE | ORAL | Status: DC

## 2017-05-22 MED ORDER — CLONAZEPAM 0.5 MG PO TABS
0.5000 mg | ORAL_TABLET | Freq: Two times a day (BID) | ORAL | Status: DC
  Administered 2017-05-22 – 2017-05-24 (×4): 0.5 mg via ORAL
  Filled 2017-05-22 (×4): qty 1

## 2017-05-22 MED ORDER — TRAZODONE HCL 100 MG PO TABS
100.0000 mg | ORAL_TABLET | Freq: Every day | ORAL | Status: DC
  Administered 2017-05-22 – 2017-05-23 (×2): 100 mg via ORAL
  Filled 2017-05-22 (×2): qty 1

## 2017-05-22 MED ORDER — DULOXETINE HCL 60 MG PO CPEP
90.0000 mg | ORAL_CAPSULE | Freq: Every day | ORAL | Status: DC
  Administered 2017-05-23 – 2017-05-24 (×2): 90 mg via ORAL
  Filled 2017-05-22 (×2): qty 1

## 2017-05-22 MED ORDER — IOPAMIDOL (ISOVUE-300) INJECTION 61%
INTRAVENOUS | Status: AC
Start: 2017-05-22 — End: 2017-05-22
  Administered 2017-05-22: 100 mL
  Filled 2017-05-22: qty 100

## 2017-05-22 MED ORDER — HYDROCODONE-ACETAMINOPHEN 5-325 MG PO TABS
1.0000 | ORAL_TABLET | Freq: Two times a day (BID) | ORAL | Status: DC
  Administered 2017-05-22 – 2017-05-23 (×3): 1 via ORAL
  Filled 2017-05-22 (×3): qty 1

## 2017-05-22 MED ORDER — SERTRALINE HCL 100 MG PO TABS
100.0000 mg | ORAL_TABLET | Freq: Every day | ORAL | Status: DC
  Administered 2017-05-23 – 2017-05-24 (×2): 100 mg via ORAL
  Filled 2017-05-22 (×2): qty 1

## 2017-05-22 MED ORDER — ATORVASTATIN CALCIUM 10 MG PO TABS
10.0000 mg | ORAL_TABLET | Freq: Every day | ORAL | Status: DC
  Administered 2017-05-22 – 2017-05-23 (×2): 10 mg via ORAL
  Filled 2017-05-22 (×2): qty 1

## 2017-05-22 MED ORDER — THIAMINE HCL 100 MG/ML IJ SOLN
100.0000 mg | Freq: Every day | INTRAMUSCULAR | Status: DC
  Administered 2017-05-22 – 2017-05-24 (×3): 100 mg via INTRAVENOUS
  Filled 2017-05-22 (×3): qty 2

## 2017-05-22 MED ORDER — PANTOPRAZOLE SODIUM 40 MG IV SOLR
40.0000 mg | Freq: Two times a day (BID) | INTRAVENOUS | Status: DC
  Administered 2017-05-22 – 2017-05-23 (×2): 40 mg via INTRAVENOUS
  Filled 2017-05-22 (×2): qty 40

## 2017-05-22 MED ORDER — FUROSEMIDE 10 MG/ML IJ SOLN
40.0000 mg | Freq: Once | INTRAMUSCULAR | Status: AC
  Administered 2017-05-23: 40 mg via INTRAVENOUS
  Filled 2017-05-22: qty 4

## 2017-05-22 MED ORDER — TRIAMTERENE-HCTZ 37.5-25 MG PO TABS
2.0000 | ORAL_TABLET | Freq: Every day | ORAL | Status: DC
  Administered 2017-05-23 – 2017-05-24 (×2): 2 via ORAL
  Filled 2017-05-22 (×2): qty 2

## 2017-05-22 MED ORDER — LORAZEPAM 2 MG/ML IJ SOLN
0.5000 mg | Freq: Once | INTRAMUSCULAR | Status: AC
  Administered 2017-05-22: 0.5 mg via INTRAVENOUS
  Filled 2017-05-22: qty 1

## 2017-05-22 MED ORDER — FERROUS GLUCONATE 324 (38 FE) MG PO TABS
324.0000 mg | ORAL_TABLET | Freq: Every day | ORAL | Status: DC
  Administered 2017-05-22: 324 mg via ORAL
  Filled 2017-05-22: qty 1

## 2017-05-22 NOTE — ED Notes (Signed)
Report attempted 

## 2017-05-22 NOTE — ED Notes (Signed)
1 unit ready 
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
## 2017-05-22 NOTE — Telephone Encounter (Signed)
Faxed cardiac clearance to [facility]. Clearance for Suspensionplasty Right Thumb, Trapezium Excision, APL Tendon Graft on 06/28/2017.  Clearance states: Intermediate risk for intermediate risk procedure. If possible, do not stop cardiac meds.

## 2017-05-22 NOTE — ED Notes (Signed)
1 unsuccessful attempt to draw labs, left a/c
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
## 2017-05-22 NOTE — ED Provider Notes (Signed)
I saw and evaluated the patient, reviewed the resident's note and I agree with the findings and plan.   EKG Interpretation None     51 year old female presents with the gastric abdominal pain times several days with associated vomiting and weakness. No fever or chills. Abdominal exam shows mild epigastric tenderness. She does have a significant prior abdominal surgery history. Concern for obstruction. Will obtain abdominal CT and results are pending   Lorre Nick, MD 05/22/17 1449

## 2017-05-22 NOTE — ED Triage Notes (Signed)
Pt. Stated, 2 days ago I started having vomiting with some palpitations and feeling really weak.  I started taking Hydrocodone on July 3.

## 2017-05-23 ENCOUNTER — Encounter (HOSPITAL_COMMUNITY): Payer: Self-pay | Admitting: Physician Assistant

## 2017-05-23 DIAGNOSIS — Z9884 Bariatric surgery status: Secondary | ICD-10-CM | POA: Diagnosis not present

## 2017-05-23 DIAGNOSIS — R1314 Dysphagia, pharyngoesophageal phase: Secondary | ICD-10-CM | POA: Diagnosis present

## 2017-05-23 DIAGNOSIS — E785 Hyperlipidemia, unspecified: Secondary | ICD-10-CM | POA: Diagnosis present

## 2017-05-23 DIAGNOSIS — K529 Noninfective gastroenteritis and colitis, unspecified: Secondary | ICD-10-CM

## 2017-05-23 DIAGNOSIS — K625 Hemorrhage of anus and rectum: Secondary | ICD-10-CM | POA: Diagnosis not present

## 2017-05-23 DIAGNOSIS — Z79899 Other long term (current) drug therapy: Secondary | ICD-10-CM | POA: Diagnosis not present

## 2017-05-23 DIAGNOSIS — K648 Other hemorrhoids: Secondary | ICD-10-CM | POA: Diagnosis present

## 2017-05-23 DIAGNOSIS — I509 Heart failure, unspecified: Secondary | ICD-10-CM | POA: Diagnosis present

## 2017-05-23 DIAGNOSIS — R112 Nausea with vomiting, unspecified: Secondary | ICD-10-CM

## 2017-05-23 DIAGNOSIS — F4310 Post-traumatic stress disorder, unspecified: Secondary | ICD-10-CM | POA: Diagnosis present

## 2017-05-23 DIAGNOSIS — I10 Essential (primary) hypertension: Secondary | ICD-10-CM | POA: Diagnosis not present

## 2017-05-23 DIAGNOSIS — R1114 Bilious vomiting: Secondary | ICD-10-CM | POA: Diagnosis not present

## 2017-05-23 DIAGNOSIS — G4733 Obstructive sleep apnea (adult) (pediatric): Secondary | ICD-10-CM | POA: Diagnosis present

## 2017-05-23 DIAGNOSIS — K5730 Diverticulosis of large intestine without perforation or abscess without bleeding: Secondary | ICD-10-CM | POA: Diagnosis present

## 2017-05-23 DIAGNOSIS — R935 Abnormal findings on diagnostic imaging of other abdominal regions, including retroperitoneum: Secondary | ICD-10-CM

## 2017-05-23 DIAGNOSIS — R1310 Dysphagia, unspecified: Secondary | ICD-10-CM | POA: Diagnosis not present

## 2017-05-23 DIAGNOSIS — F329 Major depressive disorder, single episode, unspecified: Secondary | ICD-10-CM | POA: Diagnosis present

## 2017-05-23 DIAGNOSIS — D649 Anemia, unspecified: Secondary | ICD-10-CM | POA: Diagnosis not present

## 2017-05-23 DIAGNOSIS — D509 Iron deficiency anemia, unspecified: Secondary | ICD-10-CM | POA: Diagnosis present

## 2017-05-23 DIAGNOSIS — M1990 Unspecified osteoarthritis, unspecified site: Secondary | ICD-10-CM | POA: Diagnosis present

## 2017-05-23 DIAGNOSIS — R1319 Other dysphagia: Secondary | ICD-10-CM

## 2017-05-23 DIAGNOSIS — F419 Anxiety disorder, unspecified: Secondary | ICD-10-CM | POA: Diagnosis present

## 2017-05-23 DIAGNOSIS — K254 Chronic or unspecified gastric ulcer with hemorrhage: Secondary | ICD-10-CM | POA: Diagnosis present

## 2017-05-23 DIAGNOSIS — K449 Diaphragmatic hernia without obstruction or gangrene: Secondary | ICD-10-CM | POA: Diagnosis present

## 2017-05-23 DIAGNOSIS — I110 Hypertensive heart disease with heart failure: Secondary | ICD-10-CM | POA: Diagnosis present

## 2017-05-23 DIAGNOSIS — D573 Sickle-cell trait: Secondary | ICD-10-CM | POA: Diagnosis present

## 2017-05-23 LAB — CBC
HEMATOCRIT: 27.3 % — AB (ref 36.0–46.0)
HEMOGLOBIN: 8.1 g/dL — AB (ref 12.0–15.0)
MCH: 20 pg — AB (ref 26.0–34.0)
MCHC: 29.7 g/dL — AB (ref 30.0–36.0)
MCV: 67.2 fL — ABNORMAL LOW (ref 78.0–100.0)
Platelets: 232 10*3/uL (ref 150–400)
RBC: 4.06 MIL/uL (ref 3.87–5.11)
RDW: 21.2 % — AB (ref 11.5–15.5)
WBC: 4.4 10*3/uL (ref 4.0–10.5)

## 2017-05-23 LAB — HIV ANTIBODY (ROUTINE TESTING W REFLEX): HIV SCREEN 4TH GENERATION: NONREACTIVE

## 2017-05-23 MED ORDER — PEG-KCL-NACL-NASULF-NA ASC-C 100 G PO SOLR
0.5000 | Freq: Once | ORAL | Status: AC
  Administered 2017-05-24: 100 g via ORAL

## 2017-05-23 MED ORDER — DEXTROSE 5 % IV SOLN
1.0000 g | INTRAVENOUS | Status: DC
  Administered 2017-05-23 – 2017-05-24 (×2): 1 g via INTRAVENOUS
  Filled 2017-05-23 (×2): qty 10

## 2017-05-23 MED ORDER — PANTOPRAZOLE SODIUM 40 MG PO TBEC
40.0000 mg | DELAYED_RELEASE_TABLET | Freq: Every day | ORAL | Status: DC
  Administered 2017-05-24: 40 mg via ORAL
  Filled 2017-05-23: qty 1

## 2017-05-23 MED ORDER — METOCLOPRAMIDE HCL 5 MG/ML IJ SOLN
10.0000 mg | Freq: Once | INTRAMUSCULAR | Status: AC
  Administered 2017-05-24: 10 mg via INTRAVENOUS
  Filled 2017-05-23: qty 2

## 2017-05-23 MED ORDER — METOCLOPRAMIDE HCL 5 MG/ML IJ SOLN
10.0000 mg | Freq: Once | INTRAMUSCULAR | Status: AC
  Administered 2017-05-23: 10 mg via INTRAVENOUS
  Filled 2017-05-23: qty 2

## 2017-05-23 MED ORDER — PEG-KCL-NACL-NASULF-NA ASC-C 100 G PO SOLR
1.0000 | Freq: Once | ORAL | Status: DC
  Filled 2017-05-23: qty 1

## 2017-05-23 MED ORDER — PEG-KCL-NACL-NASULF-NA ASC-C 100 G PO SOLR
0.5000 | Freq: Once | ORAL | Status: AC
  Administered 2017-05-23: 100 g via ORAL
  Filled 2017-05-23: qty 1

## 2017-05-23 NOTE — Progress Notes (Signed)
Transfusion started.  Pt is tolerating well.  No sign of adverse reaction noted.  In bed resting.  Will continue to monitor.

## 2017-05-23 NOTE — Progress Notes (Signed)
PROGRESS NOTE    Julia Wheeler  ZOX:[id number] DOB: 06-17-1966 DOA: 05/22/2017 PCP: Oneta Rack, NP   Brief Narrative: Julia Wheeler is a 51 y.o. chemotherapy history of gastric bypass surgery, reduced EF, anemia, hematochezia. She presents with nausea and vomiting   Assessment & Plan:   Principal Problem:   Symptomatic anemia Active Problems:   Nausea with vomiting   Essential hypertension   Anemia, iron deficiency   Jejunitis   Symptomatic anemia Iron deficiency anemia Patient with a history of hematochezia. She is s/p 1 unit of pRBCs with mild improvement of her hemoglobin to 8.1. Still symptomatic. Patient with a history of sickle cell trait -GI consult; NPO for possible procedure -likely will need one more unit of pRBCs  Nausea/vomiting Likely contributed to by jejunitis. Improved today  Jejunitis Seen on CT scan -blood cultures pending -continue metronidazole -start ceftriaxone  History of gastric bypass  Reduced ejection fraction Last echo with an EF of 45-50%   DVT prophylaxis: SCDs Code Status: Full code Family Communication: None at bedside Disposition Plan: likely discharge in 24 hours pending transition to oral medications   Consultants:   Gastroenterology  Procedures:   None  Antimicrobials:  Ceftriaxone  Metronidazole    Subjective: Patient reports no bloody stools. Dizziness upon walking to and from the bathroom this morning.  Objective: Vitals:   05/23/17 0111 05/23/17 0122 05/23/17 0200 05/23/17 0554  BP: 102/69 102/69 100/73 121/77  Pulse: 100 100 90 84  Resp:  14 16 14   Temp: 98.2 F (36.8 C) 98.2 F (36.8 C) 98.2 F (36.8 C) 98.4 F (36.9 C)  TempSrc: Oral Oral Oral Oral  SpO2: 100% 100% 100% 100%  Weight:      Height:        Intake/Output Summary (Last 24 hours) at 05/23/17 1147 Last data filed at 05/23/17 0900  Gross per 24 hour  Intake              350 ml  Output                0 ml  Net               350 ml   Filed Weights   05/22/17 1140  Weight: 77.1 kg (170 lb)    Examination:  General exam: Appears calm and comfortable Respiratory system: Clear to auscultation. Respiratory effort normal. Cardiovascular system: S1 & S2 heard, RRR. No murmurs. Gastrointestinal system: Abdomen is nondistended, soft and mild tenderness of LUQ.Normal bowel sounds heard. Central nervous system: Alert and oriented. No focal neurological deficits. Extremities: No edema. No calf tenderness Skin: No cyanosis. No rashes Psychiatry: Judgement and insight appear normal. Mood & affect appropriate.     Data Reviewed: I have personally reviewed following labs and imaging studies  CBC:  Recent Labs Lab 05/22/17 1200 05/23/17 0748  WBC 5.9 4.4  HGB 7.5* 8.1*  HCT 26.0* 27.3*  MCV 64.0* 67.2*  PLT 299 232   Basic Metabolic Panel:  Recent Labs Lab 05/22/17 1200  NA 137  K 3.5  CL 100*  CO2 24  GLUCOSE 131*  BUN 8  CREATININE 0.90  CALCIUM 9.6   GFR: Estimated Creatinine Clearance: 81.7 mL/min (by C-G formula based on SCr of 0.9 mg/dL). Liver Function Tests:  Recent Labs Lab 05/22/17 1200  AST 34  ALT 22  ALKPHOS 113  BILITOT 1.0  PROT 7.8  ALBUMIN 4.1    Recent Labs Lab 05/22/17 1200  LIPASE  23   No results for input(s): AMMONIA in the last 168 hours. Coagulation Profile: No results for input(s): INR, PROTIME in the last 168 hours. Cardiac Enzymes: No results for input(s): CKTOTAL, CKMB, CKMBINDEX, TROPONINI in the last 168 hours. BNP (last 3 results) No results for input(s): PROBNP in the last 8760 hours. HbA1C: No results for input(s): HGBA1C in the last 72 hours. CBG: No results for input(s): GLUCAP in the last 168 hours. Lipid Profile: No results for input(s): CHOL, HDL, LDLCALC, TRIG, CHOLHDL, LDLDIRECT in the last 72 hours. Thyroid Function Tests: No results for input(s): TSH, T4TOTAL, FREET4, T3FREE, THYROIDAB in the last 72 hours. Anemia Panel: No  results for input(s): VITAMINB12, FOLATE, FERRITIN, TIBC, IRON, RETICCTPCT in the last 72 hours. Sepsis Labs: No results for input(s): PROCALCITON, LATICACIDVEN in the last 168 hours.  No results found for this or any previous visit (from the past 240 hour(s)).       Radiology Studies: Ct Abdomen Pelvis W Contrast  Result Date: 05/22/2017 CLINICAL DATA:  LEFT abdominal pain, nausea, vomiting and weakness for 2 days. On hydrocodone since July 3rd. EXAM: CT ABDOMEN AND PELVIS WITH CONTRAST TECHNIQUE: Multidetector CT imaging of the abdomen and pelvis was performed using the standard protocol following bolus administration of intravenous contrast. CONTRAST:  ISOVUE-300 IOPAMIDOL (ISOVUE-300) INJECTION 61% COMPARISON:  CT abdomen and pelvis April 27, 2017 FINDINGS: LOWER CHEST: Lung bases are clear. Included heart size is normal. No pericardial effusion. HEPATOBILIARY: Subcentimeter intermediate density layering gallstones without CT findings of acute cholecystitis. Normal liver. PANCREAS: Normal. SPLEEN: Punctate splenic granuloma, otherwise unremarkable. ADRENALS/URINARY TRACT: Kidneys are orthotopic, demonstrating symmetric enhancement. No nephrolithiasis, hydronephrosis or solid renal masses. Too small to characterize hypodensities in the kidneys bilaterally. The unopacified ureters are normal in course and caliber. Delayed imaging through the kidneys demonstrates symmetric prompt contrast excretion within the proximal urinary collecting system. Urinary bladder is partially distended and unremarkable. Normal adrenal glands. STOMACH/BOWEL: Similar small hiatal hernia, status post gastric sleeve. Eccentric soft tissue within LEFT upper quadrant jejunum is similar to prior CT (axial 35/93). Associated mild small bowel distention proximal to gastrojejunostomy. Large bowel is normal in course and caliber without inflammatory changes. Normal appendix. VASCULAR/LYMPHATIC: Aortoiliac vessels are normal in  course and caliber. A greater number than expected small lymph nodes in LEFT upper quadrant. 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 No, uterus was retroverted and retroflexed on prior examination, now retroverted and anteflexed. OTHER: No intraperitoneal free fluid or free air. MUSCULOSKELETAL: Nonacute.  Small fat containing umbilical hernia. IMPRESSION: Focal thickening of the jejunum, similar to prior CT may be infectious, inflammatory or, dynamic. Adjacent nonspecific greater than expected number of small lymph nodes. Status post gastric bypass and gastrojejunostomy. Similar small hiatal hernia. Electronically Signed   By: Awilda Metro M.D.   On: 05/22/2017 16:48        Scheduled Meds: . atorvastatin  10 mg Oral QHS  . B-complex with vitamin C  1 tablet Oral Daily  . clonazePAM  0.5 mg Oral BID  . DULoxetine  90 mg Oral Daily  . HYDROcodone-acetaminophen  1 tablet Oral Q12H  . loratadine  10 mg Oral Daily  . pantoprazole (PROTONIX) IV  40 mg Intravenous Q12H  . sertraline  100 mg Oral Daily  . sodium chloride flush  3 mL Intravenous Q12H  . thiamine  100 mg Intravenous Daily  . traZODone  100 mg Oral QHS  . triamterene-hydrochlorothiazide  2 tablet Oral Daily  . [START  ON 05/29/2017] Vitamin D (Ergocalciferol)  50,000 Units Oral Q7 days   Continuous Infusions: . cefTRIAXone (ROCEPHIN)  IV Stopped (05/23/17 1041)  . metronidazole 500 mg (05/23/17 0540)     LOS: 0 days     Jacquelin Hawking, MD [facility] 05/23/2017, 11:47 AM Pager: [phone]  If 7PM-7AM, please contact night-coverage [email or web address] Password [facility] 05/23/2017, 11:47 AM

## 2017-05-23 NOTE — Progress Notes (Signed)
Pt arrived on unit and in no acute distress.  She is alert and oriented X 4.  Vitals signs, assessment, and telemetry placement completed.  She is in redsting.  Will continue to monitor.
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
## 2017-05-24 ENCOUNTER — Encounter (HOSPITAL_COMMUNITY)
Admission: EM | Disposition: A | Discharge status: Home Health | Payer: Self-pay | Source: Home / Self Care | Attending: Family Medicine

## 2017-05-24 ENCOUNTER — Telehealth: Payer: Self-pay

## 2017-05-24 ENCOUNTER — Inpatient Hospital Stay (HOSPITAL_COMMUNITY): Payer: Medicaid Other | Admitting: Anesthesiology

## 2017-05-24 ENCOUNTER — Encounter (HOSPITAL_COMMUNITY): Payer: Self-pay | Admitting: *Deleted

## 2017-05-24 DIAGNOSIS — K5730 Diverticulosis of large intestine without perforation or abscess without bleeding: Secondary | ICD-10-CM

## 2017-05-24 DIAGNOSIS — K648 Other hemorrhoids: Secondary | ICD-10-CM

## 2017-05-24 DIAGNOSIS — R1114 Bilious vomiting: Secondary | ICD-10-CM

## 2017-05-24 DIAGNOSIS — K5790 Diverticulosis of intestine, part unspecified, without perforation or abscess without bleeding: Secondary | ICD-10-CM

## 2017-05-24 HISTORY — PX: COLONOSCOPY, ESOPHAGOGASTRODUODENOSCOPY (EGD) AND ESOPHAGEAL DILATION: SHX5781

## 2017-05-24 HISTORY — PX: ENTEROSCOPY: SHX5533

## 2017-05-24 HISTORY — PX: COLONOSCOPY: SHX5424

## 2017-05-24 HISTORY — PX: BALLOON DILATION: SHX5330

## 2017-05-24 LAB — TYPE AND SCREEN
ABO/RH(D): O POS
ANTIBODY SCREEN: NEGATIVE
UNIT DIVISION: 0

## 2017-05-24 LAB — CBC
HEMATOCRIT: 27.7 % — AB (ref 36.0–46.0)
Hemoglobin: 8.2 g/dL — ABNORMAL LOW (ref 12.0–15.0)
MCH: 19.9 pg — AB (ref 26.0–34.0)
MCHC: 29.6 g/dL — AB (ref 30.0–36.0)
MCV: 67.1 fL — AB (ref 78.0–100.0)
PLATELETS: 225 10*3/uL (ref 150–400)
RBC: 4.13 MIL/uL (ref 3.87–5.11)
RDW: 20.6 % — AB (ref 11.5–15.5)
WBC: 4.1 10*3/uL (ref 4.0–10.5)

## 2017-05-24 LAB — BPAM RBC
Blood Product Expiration Date: 201808022359
ISSUE DATE / TIME: 201807110132
UNIT TYPE AND RH: 5100

## 2017-05-24 SURGERY — ENTEROSCOPY
Anesthesia: Monitor Anesthesia Care

## 2017-05-24 SURGERY — EGD, WITH DILATION USING SAVARY-GILLIARD DILATOR OVER GUIDEWIRE
Anesthesia: Monitor Anesthesia Care

## 2017-05-24 MED ORDER — PANTOPRAZOLE SODIUM 40 MG PO TBEC
40.0000 mg | DELAYED_RELEASE_TABLET | Freq: Two times a day (BID) | ORAL | Status: DC
  Administered 2017-05-24: 40 mg via ORAL
  Filled 2017-05-24: qty 1

## 2017-05-24 MED ORDER — VITAMIN B-1 100 MG PO TABS: 100.0000 mg | ORAL_TABLET | Freq: Every day | ORAL | Status: DC

## 2017-05-24 MED ORDER — AMOXICILLIN 500 MG PO CAPS
500.0000 mg | ORAL_CAPSULE | Freq: Three times a day (TID) | ORAL | Status: DC
  Filled 2017-05-24: qty 1

## 2017-05-24 MED ORDER — ENSURE ENLIVE PO LIQD: 237.0000 mL | Freq: Two times a day (BID) | ORAL | 0 refills | Status: DC

## 2017-05-24 MED ORDER — BUTAMBEN-TETRACAINE-BENZOCAINE 2-2-14 % EX AERO
INHALATION_SPRAY | CUTANEOUS | Status: DC | PRN
  Administered 2017-05-24: 2 via TOPICAL

## 2017-05-24 MED ORDER — PROPOFOL 500 MG/50ML IV EMUL
INTRAVENOUS | Status: DC | PRN
  Administered 2017-05-24: 200 ug/kg/min via INTRAVENOUS

## 2017-05-24 MED ORDER — PHENYLEPHRINE 40 MCG/ML (10ML) SYRINGE FOR IV PUSH (FOR BLOOD PRESSURE SUPPORT)
PREFILLED_SYRINGE | INTRAVENOUS | Status: DC | PRN
  Administered 2017-05-24: 120 ug via INTRAVENOUS
  Administered 2017-05-24: 160 ug via INTRAVENOUS
  Administered 2017-05-24: 40 ug via INTRAVENOUS
  Administered 2017-05-24: 120 ug via INTRAVENOUS
  Administered 2017-05-24: 200 ug via INTRAVENOUS
  Administered 2017-05-24 (×2): 40 ug via INTRAVENOUS
  Administered 2017-05-24: 80 ug via INTRAVENOUS

## 2017-05-24 MED ORDER — PANTOPRAZOLE SODIUM 40 MG PO TBEC: 40.0000 mg | DELAYED_RELEASE_TABLET | Freq: Two times a day (BID) | ORAL | 0 refills | Status: DC

## 2017-05-24 MED ORDER — PROPOFOL 10 MG/ML IV BOLUS
INTRAVENOUS | Status: DC | PRN
  Administered 2017-05-24 (×6): 10 mg via INTRAVENOUS

## 2017-05-24 MED ORDER — MIDAZOLAM HCL 5 MG/5ML IJ SOLN
INTRAMUSCULAR | Status: DC | PRN
  Administered 2017-05-24: 2 mg via INTRAVENOUS

## 2017-05-24 MED ORDER — METRONIDAZOLE 500 MG PO TABS: 500.0000 mg | ORAL_TABLET | Freq: Three times a day (TID) | ORAL | Status: DC

## 2017-05-24 MED ORDER — ENSURE ENLIVE PO LIQD: 237.0000 mL | Freq: Two times a day (BID) | ORAL | Status: DC

## 2017-05-24 MED ORDER — LACTATED RINGERS IV SOLN
INTRAVENOUS | Status: DC
  Administered 2017-05-24: 11:00:00 via INTRAVENOUS

## 2017-05-24 MED ORDER — AMOXICILLIN-POT CLAVULANATE 500-125 MG PO TABS: 1.0000 | ORAL_TABLET | Freq: Three times a day (TID) | ORAL | Status: DC

## 2017-05-24 MED ORDER — SODIUM CHLORIDE 0.9 % IV SOLN
510.0000 mg | Freq: Once | INTRAVENOUS | Status: AC
  Administered 2017-05-24: 510 mg via INTRAVENOUS
  Filled 2017-05-24: qty 17

## 2017-05-24 NOTE — Op Note (Signed)
[facility] Patient Name: Julia Wheeler Procedure Date : 05/24/2017 MRN: [id number] Attending MD: Beverley Fiedler , MD Date of Birth: [birth year] CSN: [id number] Age: 51 Admit Type: Inpatient Procedure:                Small bowel enteroscopy Indications:              Unexplained abdominal distress/pain in the left                            upper quadrant, Acute on chronic anemia, history of                            gastrojejunal anastomotic ulcers, rectal bleeding Providers:                Carie Caddy. Rhea Belton, MD, Jacquiline Doe, RN, Kandice Robinsons, Technician Referring MD:             [facility] Medicines:                Monitored Anesthesia Care Complications:            No immediate complications. Estimated Blood Loss:     Estimated blood loss: none. Procedure:                Pre-Anesthesia Assessment:                           - Prior to the procedure, a History and Physical                            was performed, and patient medications and                            allergies were reviewed. The patient's tolerance of                            previous anesthesia was also reviewed. The risks                            and benefits of the procedure and the sedation                            options and risks were discussed with the patient.                            All questions were answered, and informed consent                            was obtained. Prior Anticoagulants: The patient has                            taken no previous anticoagulant or antiplatelet  agents. ASA Grade Assessment: II - A patient with                            mild systemic disease. After reviewing the risks                            and benefits, the patient was deemed in                            satisfactory condition to undergo the procedure.                           After obtaining informed consent, the endoscope  was                            passed under direct vision. Throughout the                            procedure, the patient's blood pressure, pulse, and                            oxygen saturations were monitored continuously. The                            EC-[id number] ([id number]) scope was introduced through                            the mouth and advanced to the afferent and efferent                            jejunal loop. The small bowel enteroscopy was                            accomplished without difficulty. The patient                            tolerated the procedure well. Scope In: Scope Out: Findings:      The examined esophagus was normal. The GE junction was patent and Z-line       regular.      A 2 cm hiatal hernia was present.      A TTS dilator was passed through the scope. Dilation with a 15-16.5-18       mm balloon dilator was performed to 18 mm in the lower third of the       esophagus at across the GE junction due to reported history of solid       food dysphagia..      Evidence of a Roux-en-Y gastrojejunostomy was found. The gastrojejunal       anastomosis was characterized by ulceration. This ulceration involves       about 50% of the small bowel mucosa on the jejunal side of the       anastomosis. The ulceration is clean based without evidence of active or       recent bleeding. This was traversed. The gastric pouch was characterized  by healthy appearing mucosa. The gastric pouch is about 6 cm in length.       The jejunojejunal anastomosis was characterized by healthy appearing       mucosa. The entire efferent limb was normal with normal appearing mucosa       (no evidence of enteritis). Impression:               - Normal esophagus.                           - 2 cm hiatal hernia.                           - Dilation across GE juntion to 18 mm.                           - Roux-en-Y gastrojejunostomy with gastrojejunal                            anastomosis  characterized by ulceration (chronic                            and seen at EGD in summer 2017).                           - Normal jejunal mucosa in the efferent limb to the                            jejunojejunal anastomosis. Moderate Sedation:      N/A Recommendation:           - Return patient to hospital ward for ongoing care.                           - Continue present medications.                           - Twice a day PPI, open capsule and sprinkle on                            applesauce to enhance absorption                           - Avoid all NSAIDs                           - See colonoscopy report Procedure Code(s):        --- Professional ---                           [phone], Esophagogastroduodenoscopy, flexible,                            transoral; with transendoscopic balloon dilation of                            esophagus (less than 30 mm diameter) Diagnosis Code(s):        ---  Professional ---                           K44.9, Diaphragmatic hernia without obstruction or                            gangrene                           Z98.0, Intestinal bypass and anastomosis status                           R10.12, Left upper quadrant pain                           D62, Acute posthemorrhagic anemia CPT copyright 2016 [employer]. All rights reserved. The codes documented in this report are preliminary and upon coder review may  be revised to meet current compliance requirements. Beverley Fiedler, MD 05/24/2017 11:53:19 AM This report has been signed electronically. Number of Addenda: 0
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
## 2017-05-24 NOTE — Progress Notes (Signed)
Santo Held to be D/C'd to home with home health PT per MD order.  Discussed with the patient and all questions fully answered.  VSS, Skin clean, dry and intact without evidence of skin break down, no evidence of skin tears noted. IV catheter discontinued intact. Site without signs and symptoms of complications. Dressing and pressure applied.  An After Visit Summary was printed and given to the patient. Patient received prescriptions.  D/c education completed with patient/family including follow up instructions, medication list, d/c activities limitations if indicated, with other d/c instructions as indicated by MD - patient able to verbalize understanding, all questions fully answered.   Patient instructed to return to ED, call 911, or call MD for any changes in condition.   Patient escorted via WC, and D/C home via private auto.  Joellyn Haff Price 05/24/2017 6:54 PM

## 2017-05-24 NOTE — Telephone Encounter (Signed)
-----   Message from Beverley Fiedler, MD sent at 05/24/2017  2:06 PM EDT ----- Seen in hospital Needs nonurgent followup Got IV iron while hospitalized SBE/colon done by me [facility] procedures can be canceled [facility]

## 2017-05-24 NOTE — Anesthesia Postprocedure Evaluation (Signed)
Anesthesia Post Note  Patient: Julia Wheeler  Procedure(s) Performed: Procedure(s) (LRB): ENTEROSCOPY (N/A) COLONOSCOPY (N/A) BALLOON DILATION (N/A)     Patient location during evaluation: PACU Anesthesia Type: MAC Level of consciousness: awake and alert Pain management: pain level controlled Vital Signs Assessment: post-procedure vital signs reviewed and stable Respiratory status: spontaneous breathing, nonlabored ventilation and respiratory function stable Cardiovascular status: stable and blood pressure returned to baseline Anesthetic complications: no    Last Vitals:  Vitals:   05/24/17 1230 05/24/17 1245  BP: 114/71 124/89  Pulse:  75  Resp: (!) 27 (!) 22  Temp:      Last Pain:  Vitals:   05/24/17 1230  TempSrc: Oral  PainSc:                  Roselinda Bahena,W. EDMOND

## 2017-05-24 NOTE — Anesthesia Procedure Notes (Signed)
Procedure Name: MAC Date/Time: 05/24/2017 11:10 AM Performed by: Merrilyn Puma B Pre-anesthesia Checklist: Patient identified, Emergency Drugs available, Suction available, Patient being monitored and Timeout performed Patient Re-evaluated:Patient Re-evaluated prior to induction Oxygen Delivery Method: Nasal cannula Induction Type: IV induction Placement Confirmation: positive ETCO2,  CO2 detector and breath sounds checked- equal and bilateral Dental Injury: Teeth and Oropharynx as per pre-operative assessment

## 2017-05-24 NOTE — Telephone Encounter (Signed)
appt scheduled and pt is aware.

## 2017-05-24 NOTE — Op Note (Signed)
[facility] Patient Name: Julia Wheeler Procedure Date : 05/24/2017 MRN: [id number] Attending MD: Beverley Fiedler , MD Date of Birth: [birth year] CSN: [id number] Age: 51 Admit Type: Inpatient Procedure:                Colonoscopy Indications:              Abdominal pain in the left upper quadrant, Rectal                            bleeding, acute on chronic anemia Providers:                Carie Caddy. Rhea Belton, MD, Jacquiline Doe, RN, Kandice Robinsons, Technician Referring MD:             [facility] Medicines:                Monitored Anesthesia Care Complications:            No immediate complications. Estimated Blood Loss:     Estimated blood loss: none. Procedure:                Pre-Anesthesia Assessment:                           - Prior to the procedure, a History and Physical                            was performed, and patient medications and                            allergies were reviewed. The patient's tolerance of                            previous anesthesia was also reviewed. The risks                            and benefits of the procedure and the sedation                            options and risks were discussed with the patient.                            All questions were answered, and informed consent                            was obtained. Prior Anticoagulants: The patient has                            taken no previous anticoagulant or antiplatelet                            agents. ASA Grade Assessment: II - A patient with  mild systemic disease. After reviewing the risks                            and benefits, the patient was deemed in                            satisfactory condition to undergo the procedure.                           - Prior to the procedure, a History and Physical                            was performed, and patient medications and   allergies were reviewed. The patient's tolerance of                            previous anesthesia was also reviewed. The risks                            and benefits of the procedure and the sedation                            options and risks were discussed with the patient.                            All questions were answered, and informed consent                            was obtained. Prior Anticoagulants: The patient has                            taken no previous anticoagulant or antiplatelet                            agents. ASA Grade Assessment: II - A patient with                            mild systemic disease. After reviewing the risks                            and benefits, the patient was deemed in                            satisfactory condition to undergo the procedure.                           After obtaining informed consent, the colonoscope                            was passed under direct vision. Throughout the                            procedure, the patient's blood pressure, pulse, and  oxygen saturations were monitored continuously. The                            [id number] ([id number]) scope was introduced through                            the anus and advanced to the the terminal ileum.                            The colonoscopy was performed without difficulty.                            The patient tolerated the procedure well. The                            quality of the bowel preparation was good. The                            terminal ileum, ileocecal valve, appendiceal                            orifice, and rectum were photographed. Scope In: 11:55:08 AM Scope Out: 12:23:51 PM Scope Withdrawal Time: 0 hours 12 minutes 16 seconds  Total Procedure Duration: 0 hours 28 minutes 43 seconds  Findings:      The perianal exam findings include skin tags.      The terminal ileum appeared normal.      A few small-mouthed  diverticula were found in the sigmoid colon.      Internal hemorrhoids were found during retroflexion. The hemorrhoids       were medium-sized.      The exam was otherwise without abnormality. Impression:               - The examined portion of the ileum was normal.                           - Mild diverticulosis in the sigmoid colon.                           - Internal hemorrhoids, felt to explain                            intermittent low volume rectal bleeding.                           - The examination was otherwise normal.                           - No specimens collected. Moderate Sedation:      N/A Recommendation:           - Patient has a contact number available for                            emergencies. The signs and symptoms of potential  delayed complications were discussed with the                            patient. Return to normal activities tomorrow.                            Written discharge instructions were provided to the                            patient.                           - Return patient to hospital ward for ongoing care.                           - Resume previous diet.                           - Continue present medications.                           - See EGD note.                           - Oral iron should be continued and iron stores                            monitored. If iron studies remain low after                            adequate oral supplementation, IV iron is                            recommended                           - Office follow-up recommended after discharge with                            Dr. Christella Hartigan for continuity.                           - GI available, call with questions.                           - Repeat colonoscopy in 10 years for screening                            purposes. Procedure Code(s):        --- Professional ---                           [phone], Colonoscopy, flexible;  diagnostic, including                            collection of specimen(s) by brushing or washing,  when performed (separate procedure) Diagnosis Code(s):        --- Professional ---                           K64.8, Other hemorrhoids                           K64.4, Residual hemorrhoidal skin tags                           R10.12, Left upper quadrant pain                           K62.5, Hemorrhage of anus and rectum                           K57.30, Diverticulosis of large intestine without                            perforation or abscess without bleeding CPT copyright 2016 [employer]. All rights reserved. The codes documented in this report are preliminary and upon coder review may  be revised to meet current compliance requirements. Beverley Fiedler, MD 05/24/2017 12:32:57 PM This report has been signed electronically. Number of Addenda: 0

## 2017-05-24 NOTE — Discharge Instructions (Signed)
Julia Wheeler,  You were admitted because of symptomatic anemia. You received a blood transfusion and IV iron. Please continue to take your iron pills. You had an EGD and colonoscopy performed as well. You had some diverticulosis and hemorrhoids, which could cause your bleeding. Please follow-up with your gastroenterologist. Your medications have been adjusted. Physical therapy recommended therapy as an outpatient. Please follow-up with your primary physician as well.

## 2017-05-24 NOTE — Transfer of Care (Signed)
Immediate Anesthesia Transfer of Care Note  Patient: Julia Wheeler  Procedure(s) Performed: Procedure(s): ENTEROSCOPY (N/A) COLONOSCOPY (N/A) BALLOON DILATION (N/A)  Patient Location: Endoscopy Unit  Anesthesia Type:MAC  Level of Consciousness: awake, alert  and oriented  Airway & Oxygen Therapy: Patient Spontanous Breathing and Patient connected to nasal cannula oxygen  Post-op Assessment: Report given to RN and Post -op Vital signs reviewed and stable  Post vital signs: Reviewed and stable  Last Vitals:  Vitals:   05/24/17 0446 05/24/17 1043  BP: 96/70 121/90  Pulse: 96   Resp: 18 12  Temp: 36.7 C 36.9 C    Last Pain:  Vitals:   05/24/17 1043  TempSrc: Oral  PainSc:       Patients Stated Pain Goal: 3 (15/17/61 6073)  Complications: No apparent anesthesia complications

## 2017-05-24 NOTE — Discharge Summary (Signed)
Physician Discharge Summary  Julia Wheeler JXB:[id number] DOB: 02-20-1966 DOA: 05/22/2017  PCP: Oneta Rack, NP  Admit date: 05/22/2017 Discharge date: 05/24/2017  Admitted From: Home Disposition: Home  Recommendations for Outpatient Follow-up:  1. Follow up with PCP in 1 week 2. Follow up with Gastroenterology in 4 weeks 3. Please obtain CBC in one week to recheck hemoglobin 4. Avoid all NSAIDs 5. Please follow up on the following pending results: None  Home Health: Outpatient PT (vestibular) Equipment/Devices: None  Discharge Condition: Stable CODE STATUS: Full code Diet recommendation: Heart healthy   Brief/Interim Summary:  Admission HPI written by Carron Curie, MD   HPI  Julia Wheeler  is a 51 y.o. female, With past medical history significant for gastric bypass surgery in 2004, congestive heart failure and anemia presenting with abdominal pain nausea vomiting and few days history of blood per rectum. Patient additionally complains of dizziness and lightheadedness with near syncope. Patient saw gastroenterology around 2 weeks ago for hematochezia and was scheduled to have a colonoscopy and gastroscopy done earlier X month. CT scan showed jejunal inflammation with reactive lymph nodes. Her hemoglobin was 7.5 but the patient feels like she was going to pass out.   Hospital course:  Symptomatic anemia Iron deficiency anemia Patient with a history of hematochezia. She is s/p 1 unit of pRBCs with mild improvement of her hemoglobin to 8.1. Hemoglobin stable at 8.2 before discharge. Patient given IV iron prior to discharge and will continue oral iron supplementation. She'll follow up with gastroenterology as an outpatient.  Hemorrhoids Diverticulosis Seen on colonoscopy. Hemorrhoids likely cause of patient's intermittent bleeding. Outpatient follow-up with gastroenterology.  Nausea/vomiting Likely contributed to by jejunitis. Improved.  Dizziness Age with a history of  vertigo in the past. Physical therapy recommending outpatient physical therapy with vestibular rehabilitation.  Jejunitis Concern on CT scan. EGD did not confirm this finding. Initially treated with ceftriaxone and metronidazole, which was discontinued prior to discharge. Resolved this problem prior to discharge.  History of gastric bypass GE junction was dilated to 18 mm. Ulceration seen at gastrojejunal anastomoses.  Reduced ejection fraction Last echo with an EF of 45-50%   Discharge Diagnoses:  Principal Problem:   Symptomatic anemia Active Problems:   Nausea with vomiting   Essential hypertension   Anemia, iron deficiency   Rectal bleeding   Abnormal abdominal CT scan   Gastric bypass status for obesity   Esophageal dysphagia   Diverticulosis   Internal hemorrhoids    Discharge Instructions  Discharge Instructions    Ambulatory referral to Physical Therapy    Complete by:  As directed    Call MD for:  difficulty breathing, headache or visual disturbances    Complete by:  As directed    Call MD for:  extreme fatigue    Complete by:  As directed    Call MD for:  persistant dizziness or light-headedness    Complete by:  As directed    Call MD for:  persistant nausea and vomiting    Complete by:  As directed    Diet - low sodium heart healthy    Complete by:  As directed    Increase activity slowly    Complete by:  As directed      Allergies as of 05/24/2017      Reactions   Bee Venom Swelling   Swelling at the site    Lisinopril Swelling   Swelling of left side of face    Morphine And Related  Makes me crazy      Medication List    TAKE these medications   atorvastatin 10 MG tablet Commonly known as:  LIPITOR Take 1 tablet (10 mg total) by mouth daily. What changed:  when to take this   b complex vitamins capsule Take 1 capsule by mouth daily.   carvedilol 6.25 MG tablet Commonly known as:  COREG Take 1 tablet (6.25 mg total) by mouth 2  (two) times daily.   cetirizine 10 MG tablet Commonly known as:  ZYRTEC Take 10 mg by mouth as needed for allergies.   clonazePAM 0.5 MG tablet Commonly known as:  KLONOPIN Take 0.5 mg by mouth 2 (two) times daily.   doxepin 10 MG capsule Commonly known as:  SINEQUAN Take 10 mg by mouth at bedtime as needed (anxiety/depression).   DULoxetine 30 MG capsule Commonly known as:  CYMBALTA Take 90 mg by mouth daily.   feeding supplement (ENSURE ENLIVE) Liqd Take 237 mLs by mouth 2 (two) times daily between meals.   HYDROcodone-acetaminophen 5-325 MG tablet Commonly known as:  NORCO/VICODIN Take 1 tablet by mouth every 12 (twelve) hours.   IRON PO Take 1 tablet by mouth daily. Per the patient on the bottle it says 833% iron   pantoprazole 40 MG tablet Commonly known as:  PROTONIX Take 1 tablet (40 mg total) by mouth 2 (two) times daily before a meal. What changed:  when to take this   sertraline 100 MG tablet Commonly known as:  ZOLOFT Take 1 tablet (100 mg total) by mouth daily.   traZODone 50 MG tablet Commonly known as:  DESYREL Take 100 mg by mouth at bedtime.   triamterene-hydrochlorothiazide 37.5-25 MG tablet Commonly known as:  MAXZIDE-25 Take 2 tablets by mouth daily.   Vitamin D (Ergocalciferol) 50000 units Caps capsule Commonly known as:  DRISDOL Take 50,000 Units by mouth every 7 (seven) days.      Follow-up Information    Oneta Rack, NP. Schedule an appointment as soon as possible for a visit in 1 week(s).   Specialty:  Nurse Practitioner Contact information: [address] [place] [address] [phone]        Rachael Fee, MD. Schedule an appointment as soon as possible for a visit in 4 week(s).   Specialty:  Gastroenterology Contact information: [address] [place] [address] [phone]        [facility] Follow up.   Specialty:  Rehabilitation Why:  Outpatient Physcial  Therapy. Office will contact you within 2 -3 business day to schedule the initial appointment Contact information: [address] [id number] [place] [address] [phone]         Allergies  Allergen Reactions  . Bee Venom Swelling    Swelling at the site   . Lisinopril Swelling    Swelling of left side of face   . Morphine And Related     Makes me crazy    Consultations:  Gastroenterology, [place]   Procedures/Studies: Ct Abdomen Pelvis W Contrast  Result Date: 05/22/2017 CLINICAL DATA:  LEFT abdominal pain, nausea, vomiting and weakness for 2 days. On hydrocodone since July 3rd. EXAM: CT ABDOMEN AND PELVIS WITH CONTRAST TECHNIQUE: Multidetector CT imaging of the abdomen and pelvis was performed using the standard protocol following bolus administration of intravenous contrast. CONTRAST:  ISOVUE-300 IOPAMIDOL (ISOVUE-300) INJECTION 61% COMPARISON:  CT abdomen and pelvis April 27, 2017 FINDINGS: LOWER CHEST: Lung bases are clear. Included heart size  is normal. No pericardial effusion. HEPATOBILIARY: Subcentimeter intermediate density layering gallstones without CT findings of acute cholecystitis. Normal liver. PANCREAS: Normal. SPLEEN: Punctate splenic granuloma, otherwise unremarkable. ADRENALS/URINARY TRACT: Kidneys are orthotopic, demonstrating symmetric enhancement. No nephrolithiasis, hydronephrosis or solid renal masses. Too small to characterize hypodensities in the kidneys bilaterally. The unopacified ureters are normal in course and caliber. Delayed imaging through the kidneys demonstrates symmetric prompt contrast excretion within the proximal urinary collecting system. Urinary bladder is partially distended and unremarkable. Normal adrenal glands. STOMACH/BOWEL: Similar small hiatal hernia, status post gastric sleeve. Eccentric soft tissue within LEFT upper quadrant jejunum is similar to prior CT (axial 35/93). Associated mild small bowel  distention proximal to gastrojejunostomy. Large bowel is normal in course and caliber without inflammatory changes. Normal appendix. VASCULAR/LYMPHATIC: Aortoiliac vessels are normal in course and caliber. A greater number than expected small lymph nodes in LEFT upper quadrant. 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 No, uterus was retroverted and retroflexed on prior examination, now retroverted and anteflexed. OTHER: No intraperitoneal free fluid or free air. MUSCULOSKELETAL: Nonacute.  Small fat containing umbilical hernia. IMPRESSION: Focal thickening of the jejunum, similar to prior CT may be infectious, inflammatory or, dynamic. Adjacent nonspecific greater than expected number of small lymph nodes. Status post gastric bypass and gastrojejunostomy. Similar small hiatal hernia. Electronically Signed   By: Awilda Metro M.D.   On: 05/22/2017 16:48   Ct Entero Abd/pelvis W Contast  Result Date: 04/27/2017 CLINICAL DATA:  Intermittent episodes of left upper quadrant abdominal pain and rectal bleeding for 2 years. EXAM: CT ABDOMEN AND PELVIS WITH CONTRAST (ENTEROGRAPHY) TECHNIQUE: Multidetector CT of the abdomen and pelvis during bolus administration of intravenous contrast. Negative oral contrast was given. CONTRAST:  ISOVUE-300 IOPAMIDOL (ISOVUE-300) INJECTION 61% COMPARISON:  06/26/2016. FINDINGS: Lower chest: Lung bases show no acute findings. Heart size normal. No pericardial effusion. Small hiatal hernia with postoperative changes at the gastroesophageal junction. Hepatobiliary: Liver measures 18.6 cm. Liver is otherwise unremarkable. There may be tiny stones or sludge in the gallbladder. No biliary ductal dilatation. Pancreas: Negative. Spleen: Negative. Adrenals/Urinary Tract: Adrenal glands and right kidney are unremarkable. Subcentimeter low-attenuation lesion in the left kidney is too small to characterize but a cyst is most likely. Ureters are decompressed. Bladder is  unremarkable. Stomach/Bowel: Small hiatal hernia. Postoperative changes of gastric bypass. Stomach, small bowel and colon are otherwise unremarkable. Appendix is not visualized. Vascular/Lymphatic: Vascular structures are unremarkable. No pathologically enlarged lymph nodes. Reproductive: Uterus is visualized. There may be a 1.8 cm hyperattenuating fibroid. No adnexal mass. Other: No free fluid. Small periumbilical hernia contains fat. Mesenteries and peritoneum are unremarkable. Musculoskeletal: No worrisome lytic or sclerotic lesions. IMPRESSION: 1. No findings to explain the patient's symptoms. 2. Mild hepatomegaly. 3. Possible cholelithiasis/sludge. Electronically Signed   By: Leanna Battles M.D.   On: 04/27/2017 10:48    Colonoscopy (7/12)  Impression:               - The examined portion of the ileum was normal.                           - Mild diverticulosis in the sigmoid colon.                           - Internal hemorrhoids, felt to explain  intermittent low volume rectal bleeding.                           - The examination was otherwise normal.                           - No specimens collected.  Recommendation:                                  - Return patient to hospital ward for ongoing care.                           - Resume previous diet.                           - Continue present medications.                           - See EGD note.                           - Oral iron should be continued and iron stores                            monitored. If iron studies remain low after                            adequate oral supplementation, IV iron is                            recommended                           - Office follow-up recommended after discharge with                            Dr. Christella Hartigan for continuity.                           - GI available, call with questions.                           - Repeat colonoscopy in 10 years for screening                             purposes.  Small bowel enteroscopy (7/12)  Impression:       - Normal esophagus.                           - 2 cm hiatal hernia.                           - Dilation across GE juntion to 18 mm.                           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                           - Normal jejunal mucosa in the efferent limb to the                            jejunojejunal anastomosis.  Recommendation:              - Return patient to hospital ward for ongoing care.                           - Continue present medications.                           - Twice a day PPI, open capsule and sprinkle on                            applesauce to enhance absorption                           - Avoid all NSAIDs                           - See colonoscopy report   Subjective: She feels better today. Mild dizziness with movement intermittently.  Discharge Exam: Vitals:   05/24/17 1245 05/24/17 1336  BP: 124/89 135/83  Pulse: 75 86  Resp: (!) 22 20  Temp:  97.9 F (36.6 C)   Vitals:   05/24/17 1043 05/24/17 1230 05/24/17 1245 05/24/17 1336  BP: 121/90 114/71 124/89 135/83  Pulse:   75 86  Resp: 12 (!) 27 (!) 22 20  Temp: 98.4 F (36.9 C)   97.9 F (36.6 C)  TempSrc: Oral Oral    SpO2: 100% 100% 100% 100%  Weight: 77.1 kg (170 lb)     Height: 5\' 8"  (1.727 m)       General: Pt is alert, awake, not in acute distress Cardiovascular: RRR, S1/S2 +, no rubs, no gallops Respiratory: CTA bilaterally, no wheezing, no rhonchi Abdominal: Soft, NT, ND, bowel sounds + Extremities: no edema, no cyanosis    The results of significant diagnostics from this hospitalization (including imaging, microbiology, ancillary and laboratory) are listed below for reference.     Microbiology: No results found for this or any  previous visit (from the past 240 hour(s)).   Labs: BNP (last 3 results) No results for input(s): BNP in the last 8760 hours. Basic Metabolic Panel:  Recent Labs Lab 05/22/17 1200  NA 137  K 3.5  CL 100*  CO2 24  GLUCOSE 131*  BUN 8  CREATININE 0.90  CALCIUM 9.6   Liver Function Tests:  Recent Labs Lab 05/22/17 1200  AST 34  ALT 22  ALKPHOS 113  BILITOT 1.0  PROT 7.8  ALBUMIN 4.1    Recent Labs Lab 05/22/17 1200  LIPASE 23   No results for input(s): AMMONIA in the last 168 hours. CBC:  Recent Labs Lab 05/22/17 1200 05/23/17 0748 05/24/17 0744  WBC 5.9 4.4 4.1  HGB 7.5* 8.1* 8.2*  HCT 26.0* 27.3* 27.7*  MCV 64.0* 67.2* 67.1*  PLT 299 232 225   Cardiac Enzymes: No results for input(s): CKTOTAL, CKMB, CKMBINDEX, TROPONINI in the last 168 hours. BNP: Invalid input(s): POCBNP CBG: No results for input(s): GLUCAP in the last 168 hours. D-Dimer No results for input(s): DDIMER in the last 72 hours. Hgb A1c No results for input(s): HGBA1C in the last 72 hours. Lipid Profile No results for input(s): CHOL, HDL, LDLCALC, TRIG, CHOLHDL, LDLDIRECT in the last 72 hours. Thyroid function studies No results for input(s): TSH, T4TOTAL, T3FREE, THYROIDAB in the last 72 hours.  Invalid input(s): FREET3 Anemia work up No results for input(s): VITAMINB12, FOLATE, FERRITIN, TIBC, IRON, RETICCTPCT in the last 72 hours. Urinalysis    Component Value Date/Time   COLORURINE YELLOW 05/22/2017 1845   APPEARANCEUR CLEAR 05/22/2017 1845   LABSPEC >1.046 (H) 05/22/2017 1845   PHURINE 6.0 05/22/2017 1845   GLUCOSEU NEGATIVE 05/22/2017 1845   HGBUR NEGATIVE 05/22/2017 1845   BILIRUBINUR NEGATIVE 05/22/2017 1845   KETONESUR 5 (A) 05/22/2017 1845   PROTEINUR NEGATIVE 05/22/2017 1845   UROBILINOGEN 1.0 12/27/2014 0600   NITRITE NEGATIVE 05/22/2017 1845   LEUKOCYTESUR NEGATIVE 05/22/2017 1845    SIGNED:   Jacquelin Hawking, MD [facility] 05/24/2017, 6:51  PM Pager [phone]  If 7PM-7AM, please contact night-coverage [email or web address] Password [email or web address]

## 2017-05-24 NOTE — Anesthesia Preprocedure Evaluation (Addendum)
Anesthesia Evaluation  Patient identified by MRN, date of birth, ID band Patient awake    Reviewed: Allergy & Precautions, H&P , NPO status , Patient's Chart, lab work & pertinent test results, reviewed documented beta blocker date and time   Airway Mallampati: III  TM Distance: >3 FB Neck ROM: Full    Dental no notable dental hx. (+) Teeth Intact, Dental Advisory Given   Pulmonary sleep apnea ,    Pulmonary exam normal breath sounds clear to auscultation       Cardiovascular hypertension, Pt. on medications and Pt. on home beta blockers +CHF   Rhythm:Regular Rate:Normal     Neuro/Psych  Headaches, Anxiety Depression PTSD   GI/Hepatic negative GI ROS, Neg liver ROS,   Endo/Other  negative endocrine ROS  Renal/GU Renal disease  negative genitourinary   Musculoskeletal  (+) Arthritis , Osteoarthritis,    Abdominal   Peds  Hematology  (+) Sickle cell trait and anemia ,   Anesthesia Other Findings   Reproductive/Obstetrics negative OB ROS                            Anesthesia Physical Anesthesia Plan  ASA: III  Anesthesia Plan: MAC   Post-op Pain Management:    Induction: Intravenous  PONV Risk Score and Plan: 2 and Propofol and Treatment may vary due to age or medical condition  Airway Management Planned: Nasal Cannula  Additional Equipment:   Intra-op Plan:   Post-operative Plan:   Informed Consent: I have reviewed the patients History and Physical, chart, labs and discussed the procedure including the risks, benefits and alternatives for the proposed anesthesia with the patient or authorized representative who has indicated his/her understanding and acceptance.   Dental advisory given  Plan Discussed with: CRNA  Anesthesia Plan Comments:         Anesthesia Quick Evaluation

## 2017-05-28 ENCOUNTER — Other Ambulatory Visit: Payer: Self-pay | Admitting: Orthopedic Surgery

## 2017-06-01 ENCOUNTER — Other Ambulatory Visit: Payer: Self-pay | Admitting: Family

## 2017-06-01 DIAGNOSIS — G8929 Other chronic pain: Secondary | ICD-10-CM

## 2017-06-01 DIAGNOSIS — M545 Low back pain: Principal | ICD-10-CM

## 2017-06-09 ENCOUNTER — Ambulatory Visit
Admission: RE | Admit: 2017-06-09 | Discharge: 2017-06-09 | Disposition: A | Discharge status: Home / Self Care | Payer: Medicaid Other | Source: Ambulatory Visit | Attending: Family | Admitting: Family

## 2017-06-09 DIAGNOSIS — G8929 Other chronic pain: Secondary | ICD-10-CM

## 2017-06-09 DIAGNOSIS — M545 Low back pain: Principal | ICD-10-CM

## 2017-06-20 ENCOUNTER — Encounter: Payer: Self-pay | Admitting: Gastroenterology

## 2017-06-25 ENCOUNTER — Encounter: Payer: Self-pay | Admitting: Psychology

## 2017-06-26 ENCOUNTER — Encounter (HOSPITAL_BASED_OUTPATIENT_CLINIC_OR_DEPARTMENT_OTHER): Payer: Self-pay | Admitting: *Deleted

## 2017-06-28 ENCOUNTER — Ambulatory Visit (HOSPITAL_BASED_OUTPATIENT_CLINIC_OR_DEPARTMENT_OTHER): Payer: Medicaid Other | Admitting: Anesthesiology

## 2017-06-28 ENCOUNTER — Encounter (HOSPITAL_BASED_OUTPATIENT_CLINIC_OR_DEPARTMENT_OTHER): Payer: Self-pay | Admitting: *Deleted

## 2017-06-28 ENCOUNTER — Encounter (HOSPITAL_BASED_OUTPATIENT_CLINIC_OR_DEPARTMENT_OTHER)
Admission: RE | Disposition: A | Discharge status: Home / Self Care | Payer: Self-pay | Source: Ambulatory Visit | Attending: Orthopedic Surgery

## 2017-06-28 ENCOUNTER — Ambulatory Visit (HOSPITAL_BASED_OUTPATIENT_CLINIC_OR_DEPARTMENT_OTHER)
Admission: RE | Admit: 2017-06-28 | Discharge: 2017-06-28 | Disposition: A | Discharge status: Home / Self Care | Payer: Medicaid Other | Source: Ambulatory Visit | Attending: Orthopedic Surgery | Admitting: Orthopedic Surgery

## 2017-06-28 DIAGNOSIS — F329 Major depressive disorder, single episode, unspecified: Secondary | ICD-10-CM | POA: Diagnosis not present

## 2017-06-28 DIAGNOSIS — Z8719 Personal history of other diseases of the digestive system: Secondary | ICD-10-CM | POA: Insufficient documentation

## 2017-06-28 DIAGNOSIS — E785 Hyperlipidemia, unspecified: Secondary | ICD-10-CM | POA: Diagnosis not present

## 2017-06-28 DIAGNOSIS — I110 Hypertensive heart disease with heart failure: Secondary | ICD-10-CM | POA: Insufficient documentation

## 2017-06-28 DIAGNOSIS — G4730 Sleep apnea, unspecified: Secondary | ICD-10-CM | POA: Insufficient documentation

## 2017-06-28 DIAGNOSIS — F419 Anxiety disorder, unspecified: Secondary | ICD-10-CM | POA: Insufficient documentation

## 2017-06-28 DIAGNOSIS — M19072 Primary osteoarthritis, left ankle and foot: Secondary | ICD-10-CM | POA: Diagnosis not present

## 2017-06-28 DIAGNOSIS — D573 Sickle-cell trait: Secondary | ICD-10-CM | POA: Insufficient documentation

## 2017-06-28 DIAGNOSIS — K219 Gastro-esophageal reflux disease without esophagitis: Secondary | ICD-10-CM | POA: Diagnosis not present

## 2017-06-28 DIAGNOSIS — Z87442 Personal history of urinary calculi: Secondary | ICD-10-CM | POA: Insufficient documentation

## 2017-06-28 DIAGNOSIS — M19041 Primary osteoarthritis, right hand: Secondary | ICD-10-CM | POA: Diagnosis not present

## 2017-06-28 DIAGNOSIS — Z888 Allergy status to other drugs, medicaments and biological substances status: Secondary | ICD-10-CM | POA: Diagnosis not present

## 2017-06-28 DIAGNOSIS — Z91030 Bee allergy status: Secondary | ICD-10-CM | POA: Diagnosis not present

## 2017-06-28 DIAGNOSIS — I509 Heart failure, unspecified: Secondary | ICD-10-CM | POA: Diagnosis not present

## 2017-06-28 DIAGNOSIS — F4310 Post-traumatic stress disorder, unspecified: Secondary | ICD-10-CM | POA: Insufficient documentation

## 2017-06-28 DIAGNOSIS — M19042 Primary osteoarthritis, left hand: Secondary | ICD-10-CM | POA: Diagnosis not present

## 2017-06-28 DIAGNOSIS — Z8249 Family history of ischemic heart disease and other diseases of the circulatory system: Secondary | ICD-10-CM | POA: Insufficient documentation

## 2017-06-28 DIAGNOSIS — M19071 Primary osteoarthritis, right ankle and foot: Secondary | ICD-10-CM | POA: Diagnosis not present

## 2017-06-28 DIAGNOSIS — Z833 Family history of diabetes mellitus: Secondary | ICD-10-CM | POA: Diagnosis not present

## 2017-06-28 DIAGNOSIS — Z885 Allergy status to narcotic agent status: Secondary | ICD-10-CM | POA: Diagnosis not present

## 2017-06-28 DIAGNOSIS — M1811 Unilateral primary osteoarthritis of first carpometacarpal joint, right hand: Secondary | ICD-10-CM | POA: Diagnosis not present

## 2017-06-28 HISTORY — PX: CARPOMETACARPEL SUSPENSION PLASTY: SHX5005

## 2017-06-28 HISTORY — DX: Gastro-esophageal reflux disease without esophagitis: K21.9

## 2017-06-28 LAB — POCT I-STAT, CHEM 8
BUN: 20 mg/dL (ref 6–20)
Calcium, Ion: 1.24 mmol/L (ref 1.15–1.40)
Chloride: 102 mmol/L (ref 101–111)
Creatinine, Ser: 1 mg/dL (ref 0.44–1.00)
Glucose, Bld: 100 mg/dL — ABNORMAL HIGH (ref 65–99)
HEMATOCRIT: 34 % — AB (ref 36.0–46.0)
HEMOGLOBIN: 11.6 g/dL — AB (ref 12.0–15.0)
POTASSIUM: 3.6 mmol/L (ref 3.5–5.1)
Sodium: 140 mmol/L (ref 135–145)
TCO2: 28 mmol/L (ref 0–100)

## 2017-06-28 SURGERY — CARPOMETACARPEL (CMC) SUSPENSION PLASTY
Anesthesia: General | Site: Hand | Laterality: Right

## 2017-06-28 MED ORDER — FENTANYL CITRATE (PF) 100 MCG/2ML IJ SOLN: 25.0000 ug | INTRAMUSCULAR | Status: DC | PRN

## 2017-06-28 MED ORDER — DEXAMETHASONE SODIUM PHOSPHATE 10 MG/ML IJ SOLN
INTRAMUSCULAR | Status: AC
  Filled 2017-06-28: qty 1

## 2017-06-28 MED ORDER — SUCCINYLCHOLINE CHLORIDE 20 MG/ML IJ SOLN
INTRAMUSCULAR | Status: DC | PRN
  Administered 2017-06-28: 50 mg via INTRAVENOUS

## 2017-06-28 MED ORDER — LIDOCAINE HCL (CARDIAC) 20 MG/ML IV SOLN
INTRAVENOUS | Status: DC | PRN
  Administered 2017-06-28: 30 mg via INTRAVENOUS

## 2017-06-28 MED ORDER — PROPOFOL 10 MG/ML IV BOLUS
INTRAVENOUS | Status: AC
  Filled 2017-06-28: qty 20

## 2017-06-28 MED ORDER — FENTANYL CITRATE (PF) 100 MCG/2ML IJ SOLN
50.0000 ug | INTRAMUSCULAR | Status: DC | PRN
  Administered 2017-06-28: 50 ug via INTRAVENOUS

## 2017-06-28 MED ORDER — FENTANYL CITRATE (PF) 100 MCG/2ML IJ SOLN
INTRAMUSCULAR | Status: AC
  Filled 2017-06-28: qty 2

## 2017-06-28 MED ORDER — ACETAMINOPHEN 500 MG PO TABS
ORAL_TABLET | ORAL | Status: AC
  Filled 2017-06-28: qty 2

## 2017-06-28 MED ORDER — PROPOFOL 10 MG/ML IV BOLUS
INTRAVENOUS | Status: DC | PRN
  Administered 2017-06-28: 150 mg via INTRAVENOUS

## 2017-06-28 MED ORDER — CEFAZOLIN SODIUM-DEXTROSE 2-4 GM/100ML-% IV SOLN
INTRAVENOUS | Status: AC
  Filled 2017-06-28: qty 100

## 2017-06-28 MED ORDER — CHLORHEXIDINE GLUCONATE 4 % EX LIQD: 60.0000 mL | Freq: Once | CUTANEOUS | Status: DC

## 2017-06-28 MED ORDER — LIDOCAINE 2% (20 MG/ML) 5 ML SYRINGE
INTRAMUSCULAR | Status: AC
  Filled 2017-06-28: qty 5

## 2017-06-28 MED ORDER — MIDAZOLAM HCL 2 MG/2ML IJ SOLN
1.0000 mg | INTRAMUSCULAR | Status: DC | PRN
  Administered 2017-06-28: 1 mg via INTRAVENOUS

## 2017-06-28 MED ORDER — ESMOLOL HCL 100 MG/10ML IV SOLN
INTRAVENOUS | Status: DC | PRN
  Administered 2017-06-28: 20 mg via INTRAVENOUS

## 2017-06-28 MED ORDER — DEXAMETHASONE SODIUM PHOSPHATE 4 MG/ML IJ SOLN
INTRAMUSCULAR | Status: DC | PRN
  Administered 2017-06-28: 10 mg via INTRAVENOUS

## 2017-06-28 MED ORDER — SUCCINYLCHOLINE CHLORIDE 200 MG/10ML IV SOSY
PREFILLED_SYRINGE | INTRAVENOUS | Status: AC
  Filled 2017-06-28: qty 10

## 2017-06-28 MED ORDER — CEFAZOLIN SODIUM-DEXTROSE 2-4 GM/100ML-% IV SOLN
2.0000 g | INTRAVENOUS | Status: AC
  Administered 2017-06-28: 2 g via INTRAVENOUS

## 2017-06-28 MED ORDER — ACETAMINOPHEN 500 MG PO TABS
1000.0000 mg | ORAL_TABLET | Freq: Once | ORAL | Status: AC
  Administered 2017-06-28: 1000 mg via ORAL

## 2017-06-28 MED ORDER — ONDANSETRON HCL 4 MG/2ML IJ SOLN
INTRAMUSCULAR | Status: DC | PRN
  Administered 2017-06-28: 4 mg via INTRAVENOUS

## 2017-06-28 MED ORDER — ESMOLOL HCL 100 MG/10ML IV SOLN
INTRAVENOUS | Status: AC
  Filled 2017-06-28: qty 10

## 2017-06-28 MED ORDER — SCOPOLAMINE 1 MG/3DAYS TD PT72: 1.0000 | MEDICATED_PATCH | Freq: Once | TRANSDERMAL | Status: DC | PRN

## 2017-06-28 MED ORDER — MIDAZOLAM HCL 2 MG/2ML IJ SOLN
INTRAMUSCULAR | Status: AC
  Filled 2017-06-28: qty 2

## 2017-06-28 MED ORDER — LACTATED RINGERS IV SOLN
INTRAVENOUS | Status: DC
  Administered 2017-06-28: 08:00:00 via INTRAVENOUS

## 2017-06-28 MED ORDER — ROPIVACAINE HCL 7.5 MG/ML IJ SOLN
INTRAMUSCULAR | Status: DC | PRN
  Administered 2017-06-28: 20 mL via PERINEURAL

## 2017-06-28 MED ORDER — ONDANSETRON HCL 4 MG/2ML IJ SOLN
INTRAMUSCULAR | Status: AC
  Filled 2017-06-28: qty 2

## 2017-06-28 MED ORDER — FENTANYL CITRATE (PF) 100 MCG/2ML IJ SOLN
INTRAMUSCULAR | Status: DC | PRN
Start: 2017-06-28 — End: 2017-06-28
  Administered 2017-06-28: 100 ug via INTRAVENOUS

## 2017-06-28 MED ORDER — MIDAZOLAM HCL 5 MG/5ML IJ SOLN
INTRAMUSCULAR | Status: DC | PRN
  Administered 2017-06-28: 2 mg via INTRAVENOUS

## 2017-06-28 MED ORDER — OXYCODONE-ACETAMINOPHEN 7.5-325 MG PO TABS: 1.0000 | ORAL_TABLET | ORAL | 0 refills | Status: DC | PRN

## 2017-06-28 MED ORDER — ONDANSETRON HCL 4 MG/2ML IJ SOLN: 4.0000 mg | Freq: Once | INTRAMUSCULAR | Status: DC | PRN

## 2017-06-28 SURGICAL SUPPLY — 56 items
BLADE MINI RND TIP GREEN BEAV (BLADE) ×3 IMPLANT
BLADE SURG 15 STRL LF DISP TIS (BLADE) ×1 IMPLANT
BLADE SURG 15 STRL SS (BLADE) ×2
BNDG COHESIVE 3X5 TAN STRL LF (GAUZE/BANDAGES/DRESSINGS) ×3 IMPLANT
BNDG ESMARK 4X9 LF (GAUZE/BANDAGES/DRESSINGS) ×3 IMPLANT
BNDG GAUZE ELAST 4 BULKY (GAUZE/BANDAGES/DRESSINGS) ×3 IMPLANT
CHLORAPREP W/TINT 26ML (MISCELLANEOUS) ×3 IMPLANT
CORD BIPOLAR FORCEPS 12FT (ELECTRODE) ×3 IMPLANT
COVER BACK TABLE 60X90IN (DRAPES) ×3 IMPLANT
COVER MAYO STAND STRL (DRAPES) ×3 IMPLANT
CUFF TOURNIQUET SINGLE 18IN (TOURNIQUET CUFF) IMPLANT
DECANTER SPIKE VIAL GLASS SM (MISCELLANEOUS) IMPLANT
DRAPE EXTREMITY T 121X128X90 (DRAPE) ×3 IMPLANT
DRAPE OEC MINIVIEW 54X84 (DRAPES) ×3 IMPLANT
DRAPE SURG 17X23 STRL (DRAPES) ×3 IMPLANT
GAUZE SPONGE 4X4 12PLY STRL (GAUZE/BANDAGES/DRESSINGS) ×3 IMPLANT
GAUZE SPONGE 4X4 16PLY XRAY LF (GAUZE/BANDAGES/DRESSINGS) IMPLANT
GAUZE XEROFORM 1X8 LF (GAUZE/BANDAGES/DRESSINGS) ×3 IMPLANT
GLOVE BIO SURGEON STRL SZ7.5 (GLOVE) ×3 IMPLANT
GLOVE BIOGEL PI IND STRL 7.0 (GLOVE) ×2 IMPLANT
GLOVE BIOGEL PI IND STRL 8 (GLOVE) ×1 IMPLANT
GLOVE BIOGEL PI IND STRL 8.5 (GLOVE) ×1 IMPLANT
GLOVE BIOGEL PI INDICATOR 7.0 (GLOVE) ×4
GLOVE BIOGEL PI INDICATOR 8 (GLOVE) ×2
GLOVE BIOGEL PI INDICATOR 8.5 (GLOVE) ×2
GLOVE ECLIPSE 6.5 STRL STRAW (GLOVE) ×3 IMPLANT
GLOVE SURG ORTHO 8.0 STRL STRW (GLOVE) ×3 IMPLANT
GOWN STRL REUS W/ TWL LRG LVL3 (GOWN DISPOSABLE) ×1 IMPLANT
GOWN STRL REUS W/TWL LRG LVL3 (GOWN DISPOSABLE) ×2
GOWN STRL REUS W/TWL XL LVL3 (GOWN DISPOSABLE) ×6 IMPLANT
NEEDLE PRECISIONGLIDE 27X1.5 (NEEDLE) IMPLANT
NS IRRIG 1000ML POUR BTL (IV SOLUTION) ×3 IMPLANT
PACK BASIN DAY SURGERY FS (CUSTOM PROCEDURE TRAY) ×3 IMPLANT
PAD CAST 3X4 CTTN HI CHSV (CAST SUPPLIES) ×1 IMPLANT
PADDING CAST ABS 3INX4YD NS (CAST SUPPLIES) ×2
PADDING CAST ABS COTTON 3X4 (CAST SUPPLIES) ×1 IMPLANT
PADDING CAST COTTON 3X4 STRL (CAST SUPPLIES) ×2
SLEEVE SCD COMPRESS KNEE MED (MISCELLANEOUS) ×3 IMPLANT
SLING ARM FOAM STRAP MED (SOFTGOODS) ×3 IMPLANT
SPLINT PLASTER CAST XFAST 3X15 (CAST SUPPLIES) ×10 IMPLANT
SPLINT PLASTER XTRA FASTSET 3X (CAST SUPPLIES) ×20
STOCKINETTE 4X48 STRL (DRAPES) ×3 IMPLANT
SUT ETHIBOND 3-0 V-5 (SUTURE) IMPLANT
SUT ETHILON 4 0 PS 2 18 (SUTURE) ×6 IMPLANT
SUT FIBERWIRE 4-0 18 DIAM BLUE (SUTURE) ×3
SUT MERSILENE 4 0 P 3 (SUTURE) IMPLANT
SUT STEEL 3 0 (SUTURE) ×3 IMPLANT
SUT VIC AB 4-0 P-3 18XBRD (SUTURE) IMPLANT
SUT VIC AB 4-0 P2 18 (SUTURE) IMPLANT
SUT VIC AB 4-0 P3 18 (SUTURE)
SUTURE FIBERWR 4-0 18 DIA BLUE (SUTURE) ×1 IMPLANT
SYR BULB 3OZ (MISCELLANEOUS) ×3 IMPLANT
SYR CONTROL 10ML LL (SYRINGE) IMPLANT
TOWEL OR 17X24 6PK STRL BLUE (TOWEL DISPOSABLE) ×6 IMPLANT
TOWEL OR NON WOVEN STRL DISP B (DISPOSABLE) ×3 IMPLANT
UNDERPAD 30X30 (UNDERPADS AND DIAPERS) IMPLANT

## 2017-06-28 NOTE — Anesthesia Procedure Notes (Signed)
Anesthesia Regional Block: Supraclavicular block   Pre-Anesthetic Checklist: ,, timeout performed, Correct Patient, Correct Site, Correct Laterality, Correct Procedure, Correct Position, site marked, Risks and benefits discussed,  Surgical consent,  Pre-op evaluation,  At surgeon's request and post-op pain management  Laterality: Right  Prep: chloraprep       Needles:  Injection technique: Single-shot  Needle Type: Echogenic Needle     Needle Length: 9cm  Needle Gauge: 21     Additional Needles:   Procedures: ultrasound guided,,,,,,,,  Narrative:  Start time: 06/28/2017 7:58 AM End time: 06/28/2017 8:03 AM Injection made incrementally with aspirations every 5 mL.  Performed by: Personally  Anesthesiologist: Cecile Hearing  Additional Notes: No pain on injection. No increased resistance to injection. Injection made in 5cc increments.  Good needle visualization.  Patient tolerated procedure well.

## 2017-06-28 NOTE — Anesthesia Procedure Notes (Signed)
Procedure Name: Intubation Date/Time: 06/28/2017 8:53 AM Performed by: Kenton Desanctis Pre-anesthesia Checklist: Patient identified, Emergency Drugs available, Suction available, Patient being monitored and Timeout performed Patient Re-evaluated:Patient Re-evaluated prior to induction Oxygen Delivery Method: Circle system utilized Preoxygenation: Pre-oxygenation with 100% oxygen Induction Type: IV induction Ventilation: Mask ventilation without difficulty Laryngoscope Size: Miller and 3 Tube type: Oral Tube size: 7.0 mm Number of attempts: 1 Airway Equipment and Method: Stylet and Oral airway Placement Confirmation: ETT inserted through vocal cords under direct vision,  positive ETCO2 and breath sounds checked- equal and bilateral Secured at: 20 cm Tube secured with: Tape Dental Injury: Teeth and Oropharynx as per pre-operative assessment

## 2017-06-28 NOTE — Op Note (Signed)
I assisted Surgeon(s) and Role:    * Cindee Salt, MD - Primary    * Betha Loa, MD - Assisting on the Procedure(s): SUSPENSIONPLASTY RIGHT HAND TRAPEZIUM EXCISION WITH THUMB METACARPAL SUSPENSIONPLASTY WITH ARL TENDON GRAFT on 06/28/2017.  I provided assistance on this case as follows: retraction soft tissue, positioning of hand, harvest tendon, passing of tendon transfer.  Electronically signed by: Tami Ribas, MD Date: 06/28/2017 Time: 12:07 PM

## 2017-06-28 NOTE — Anesthesia Postprocedure Evaluation (Signed)
Anesthesia Post Note  Patient: LEEDA REOME  Procedure(s) Performed: Procedure(s) (LRB): SUSPENSIONPLASTY RIGHT HAND TRAPEZIUM EXCISION WITH THUMB METACARPAL SUSPENSIONPLASTY WITH ARL TENDON GRAFT (Right)     Patient location during evaluation: PACU Anesthesia Type: General Level of consciousness: awake and alert Pain management: pain level controlled Vital Signs Assessment: post-procedure vital signs reviewed and stable Respiratory status: spontaneous breathing, nonlabored ventilation and respiratory function stable Cardiovascular status: blood pressure returned to baseline and stable Postop Assessment: no signs of nausea or vomiting Anesthetic complications: no    Last Vitals:  Vitals:   06/28/17 1032 06/28/17 1100  BP: (!) 135/93 (!) 148/98  Pulse: 79 83  Resp: (!) 22 16  Temp:  37.3 C  SpO2: 97% 100%    Last Pain:  Vitals:   06/28/17 1100  TempSrc:   PainSc: 0-No pain                 Cecile Hearing

## 2017-06-28 NOTE — Transfer of Care (Signed)
Immediate Anesthesia Transfer of Care Note  Patient: Julia Wheeler  Procedure(s) Performed: Procedure(s) with comments: SUSPENSIONPLASTY RIGHT HAND TRAPEZIUM EXCISION WITH THUMB METACARPAL SUSPENSIONPLASTY WITH ARL TENDON GRAFT (Right) - BLOCK  Patient Location: PACU  Anesthesia Type:GA combined with regional for post-op pain  Level of Consciousness: awake and patient cooperative  Airway & Oxygen Therapy: Patient Spontanous Breathing and Patient connected to face mask oxygen  Post-op Assessment: Report given to RN and Post -op Vital signs reviewed and stable  Post vital signs: Reviewed and stable  Last Vitals:  Vitals:   06/28/17 0803 06/28/17 0804  BP:    Pulse: 79 80  Resp: 12 10  Temp:    SpO2: 100% 100%    Last Pain:  Vitals:   06/28/17 0714  TempSrc:   PainSc: 8       Patients Stated Pain Goal: 3 (06/28/17 0714)  Complications: No apparent anesthesia complications
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
## 2017-06-28 NOTE — Discharge Instructions (Addendum)
Regional Anesthesia Blocks  1. Numbness or the inability to move the "blocked" extremity may last from 3-48 hours after placement. The length of time depends on the medication injected and your individual response to the medication. If the numbness is not going away after 48 hours, call your surgeon.  2. The extremity that is blocked will need to be protected until the numbness is gone and the  Strength has returned. Because you cannot feel it, you will need to take extra care to avoid injury. Because it may be weak, you may have difficulty moving it or using it. You may not know what position it is in without looking at it while the block is in effect.  3. For blocks in the legs and feet, returning to weight bearing and walking needs to be done carefully. You will need to wait until the numbness is entirely gone and the strength has returned. You should be able to move your leg and foot normally before you try and bear weight or walk. You will need someone to be with you when you first try to ensure you do not fall and possibly risk injury.  4. Bruising and tenderness at the needle site are common side effects and will resolve in a few days.  5. Persistent numbness or new problems with movement should be communicated to the surgeon or the [facility] ([phone])/ [facility] ([phone]).   Hand Center Instructions Hand Surgery  Wound Care: Keep your hand elevated above the level of your heart.  Do not allow it to dangle by your side.  Keep the dressing dry and do not remove it unless your doctor advises you to do so.  He will usually change it at the time of your post-op visit.  Moving your fingers is advised to stimulate circulation but will depend on the site of your surgery.  If you have a splint applied, your doctor will advise you regarding movement.  Activity: Do not drive or operate machinery today.  Rest today and then you may return to your normal activity  and work as indicated by your physician.  Diet:  Drink liquids today or eat a light diet.  You may resume a regular diet tomorrow.    General expectations: Pain for two to three days. Fingers may become slightly swollen.  Call your doctor if any of the following occur: Severe pain not relieved by pain medication. Elevated temperature. Dressing soaked with blood. Inability to move fingers. White or bluish color to fingers.  

## 2017-06-28 NOTE — Brief Op Note (Signed)
06/28/2017  10:03 AM  PATIENT:  Julia Wheeler  52 y.o. female  PRE-OPERATIVE DIAGNOSIS:  RIGHT THUMB END STAGE OSTEOARTHRITIC DEGENERATION CARPOMETACARPAL JOINT  POST-OPERATIVE DIAGNOSIS:  RIGHT THUMB END STAGE OSTEOARTHRITIC   PROCEDURE:  Procedure(s) with comments: SUSPENSIONPLASTY RIGHT HAND TRAPEZIUM EXCISION WITH THUMB METACARPAL SUSPENSIONPLASTY WITH ARL TENDON GRAFT (Right) - BLOCK  SURGEON:  Surgeon(s) and Role:    * Cindee Salt, MD - Primary    * Betha Loa, MD - Assisting  PHYSICIAN ASSISTANT:   ASSISTANTS: K Jenaye Rickert,MD   ANESTHESIA:   regional and general  EBL:  Total I/O In: -  Out: 3 [Blood:3]  BLOOD ADMINISTERED:none  DRAINS: none   LOCAL MEDICATIONS USED:  NONE  SPECIMEN:  No Specimen  DISPOSITION OF SPECIMEN:  N/A  COUNTS:  YES  TOURNIQUET:   Total Tourniquet Time Documented: Upper Arm (Right) - 53 minutes Total: Upper Arm (Right) - 53 minutes   DICTATION: .Other Dictation: Dictation Number [id number]  PLAN OF CARE: Discharge to home after PACU  PATIENT DISPOSITION:  PACU - hemodynamically stable.

## 2017-06-28 NOTE — Progress Notes (Signed)
Assisted Dr. Turk with right, ultrasound guided, supraclavicular block. Side rails up, monitors on throughout procedure. See vital signs in flow sheet. Tolerated Procedure well. 

## 2017-06-28 NOTE — Op Note (Signed)
Dictation Number [id number]

## 2017-06-28 NOTE — Anesthesia Preprocedure Evaluation (Addendum)
Anesthesia Evaluation  Patient identified by MRN, date of birth, ID band Patient awake    Reviewed: Allergy & Precautions, NPO status , Patient's Chart, lab work & pertinent test results, reviewed documented beta blocker date and time   Airway Mallampati: II  TM Distance: >3 FB Neck ROM: Full    Dental  (+) Teeth Intact, Dental Advisory Given   Pulmonary neg pulmonary ROS,    Pulmonary exam normal breath sounds clear to auscultation       Cardiovascular hypertension, Pt. on home beta blockers and Pt. on medications +CHF  Normal cardiovascular exam Rhythm:Regular Rate:Normal  Echo 02/10/16: Study Conclusions  - Left ventricle: The cavity size was normal. Systolic function was mildly reduced. The estimated ejection fraction was in the range of 45% to 50%. Mild diffuse hypokinesis with no identifiable regional variations. Left ventricular diastolic function parameters were normal.   Neuro/Psych  Headaches, PSYCHIATRIC DISORDERS Anxiety Depression    GI/Hepatic Neg liver ROS, PUD, GERD  Medicated and Poorly Controlled,  Endo/Other  negative endocrine ROS  Renal/GU negative Renal ROS     Musculoskeletal  (+) Arthritis , Osteoarthritis,    Abdominal   Peds  Hematology  (+) Blood dyscrasia, Sickle cell trait and anemia ,   Anesthesia Other Findings Day of surgery medications reviewed with the patient.  Reproductive/Obstetrics                            Anesthesia Physical Anesthesia Plan  ASA: III  Anesthesia Plan: General   Post-op Pain Management:  Regional for Post-op pain   Induction: Intravenous  PONV Risk Score and Plan: 3 and Ondansetron, Dexamethasone and Midazolam  Airway Management Planned: Oral ETT  Additional Equipment:   Intra-op Plan:   Post-operative Plan: Extubation in OR  Informed Consent: I have reviewed the patients History and Physical, chart, labs and discussed  the procedure including the risks, benefits and alternatives for the proposed anesthesia with the patient or authorized representative who has indicated his/her understanding and acceptance.   Dental advisory given  Plan Discussed with: CRNA  Anesthesia Plan Comments: (Risks/benefits of general anesthesia discussed with patient including risk of damage to teeth, lips, gum, and tongue, nausea/vomiting, allergic reactions to medications, and the possibility of heart attack, stroke and death.  All patient questions answered.  Patient wishes to proceed.)       Anesthesia Quick Evaluation

## 2017-06-29 ENCOUNTER — Encounter (HOSPITAL_BASED_OUTPATIENT_CLINIC_OR_DEPARTMENT_OTHER): Payer: Self-pay | Admitting: Orthopedic Surgery

## 2017-06-29 NOTE — Op Note (Signed)
NAMEVANNA, Julia Wheeler                 ACCOUNT NO.:  [id number]  MEDICAL RECORD NO.:  [id number]  LOCATION:                                 FACILITY:  PHYSICIAN:  Cindee Salt, M.D.       DATE OF BIRTH:  1966-06-05  DATE OF PROCEDURE:  06/28/2017 DATE OF DISCHARGE:                              OPERATIVE REPORT   PREOPERATIVE DIAGNOSIS:  CMC arthritis, pantrapezial right thumb.  POSTOPERATIVE DIAGNOSIS:  CMC arthritis, pantrapezial right thumb.  OPERATIONS:  Excision of trapezium with abductor pollicis longus tendon Transfer Abductor pollicis longus tendon suspensionplasty, right thumb.  SURGEON:  Cindee Salt, M.D.  ASSISTANT:  Betha Loa, M.D.  ANESTHESIA:  Supraclavicular block, general.  PLACE OF SURGERY:  [facility].  ANESTHESIOLOGIST:  Dr. Desmond Lope.  HISTORY:  The patient is a 51 year old female with a history of degenerative changes of the carpometacarpal joint of her right thumb. This has not responded to conservative treatment including multiple injections.  She has elected to undergo surgical correction and reconstruction of the [facility] joint.  Pre, peri, and postoperative course have been discussed along with risks and complications.  She is aware that there is no guarantee to the surgery; the possibility of infection; recurrence of injury to arteries, nerves, tendons; incomplete relief of symptoms; and dystrophy.  In preoperative area, the patient was seen, the extremity marked by both the patient and surgeon.  Antibiotic given.  DESCRIPTION OF PROCEDURE:  The patient was brought to the operating room.  After supraclavicular block was carried out in the preoperative area under the direction of Dr. Desmond Lope, she was prepped using ChloraPrep in a supine position with the right arm free.  A general anesthetic was added in addition.  A time-out was taken confirming the patient and procedure.  The limb was exsanguinated with an Esmarch bandage. Tourniquet placed high and  the arm was inflated to 250 mmHg.  A curvilinear incision was made over the first dorsal extensor compartment distally and then carried slightly dorsally at the [facility] joint.  This was carried down through subcutaneous tissue.  Bleeders were electrocauterized with bipolar.  The radial nerve branches were identified and protected.  The incision was then made between the abductor pollicis longus and extensor pollicis brevis.  The radial artery was identified proximally.  An incision was then made in the STT joint and CMC joint.  The periosteum and capsules were then elevated with blunt and sharp dissection.  The interval between the trapezium and trapezoid was identified.  Ligaments were sharply dissected.  The trapezium was then removed, unfortunately broke, and had to be removed piecemeal.  The most dorsal aspect of the abductor pollicis longus was then isolated.  This was inserted onto the base of the thumb metacarpal. This was isolated proximally.  With traction, it was identified proximally a musculotendinous junction.  A separate incision was then made transversely, carried down through subcutaneous tissue.  The fascia opened.  The muscle belly identified.  A Carroll tendon passer was then passed from a proximal to distal direction through the first dorsal compartment.  A monofilament wire was then used as a cheese cutter attached to the  Carroll tendon passer and brought proximally allowing harvest of the portion of the abductor pollicis longus at the musculotendinous junction.  The wound was irrigated and closed with interrupted 4-0 nylon sutures proximally.  The tendon was then delivered distally, left attached to the base of the thumb metacarpal.  A drill hole was then placed in the base of the thumb metacarpal from a dorsal to palmar direction from distal to proximal.  This exited at the area of the oblique pulley.  The area of the ligament stabilized on the [facility] joint to the index  metacarpal was then reconstructed by drilling a drill hole through the base of the index metacarpal just distal to the articular surface for articulation with the thumb metacarpal base.  The drill holes were placed with &/64 inchdrills.  This was brought out on the dorsal ulnar aspect of the index metacarpal.  An incision was made there and dissected down to the hole.  Monofilament wire was then passed and used to pass the abductor pollicis longus tendon through the base of the thumb and then the base of the index finger.  Hemostat was then used subperiosteally bringing this back down into the defect of the trapezium.  This was wrapped around the abductor pollicis longus where it entered into the index metacarpal and sutured with multiple 3-0 Ethibond sutures.  This was done with a figure-of-eight manner, fully stabilizing the thumb from the index finger.  This was confirmed with AP and lateral x-rays including stressing it.  There was no proximal migration.  The wound was copiously irrigated with saline.  The capsule and periosteum of the trapezium were able to be closed with figure-of- eight 4-0 Vicryl sutures, subcutaneous tissue with interrupted 4-0 Vicryl, and skin with interrupted 4-0 nylon sutures on the incision at the base of the thumb and base of the index metacarpal.  A sterile compressive dressing and dorsal palmar thumb spica splint applied.  On deflation of the tourniquet, all fingers immediately pinked.  She was taken to the recovery room for observation in satisfactory condition. She will be discharged to home to return to the [facility] in 1 week, on Percocet.          ______________________________ Cindee Salt, M.D.     GK/MEDQ  D:  06/28/2017  T:  06/28/2017  Job:  [id number]

## 2017-06-29 NOTE — Addendum Note (Signed)
Addendum  created 06/29/17 1215 by Lance Coon, CRNA   Charge Capture section accepted

## 2017-07-03 ENCOUNTER — Ambulatory Visit: Payer: Medicaid Other | Attending: Family Medicine | Admitting: Physical Therapy

## 2017-07-03 DIAGNOSIS — R2681 Unsteadiness on feet: Secondary | ICD-10-CM | POA: Insufficient documentation

## 2017-07-03 DIAGNOSIS — R42 Dizziness and giddiness: Secondary | ICD-10-CM | POA: Diagnosis not present
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
## 2017-07-23 ENCOUNTER — Ambulatory Visit: Payer: Medicaid Other | Admitting: Physical Therapy

## 2017-07-26 ENCOUNTER — Encounter: Payer: Self-pay | Admitting: Physical Therapy

## 2017-07-27 ENCOUNTER — Ambulatory Visit: Payer: Self-pay | Admitting: Gastroenterology

## 2017-07-31 ENCOUNTER — Ambulatory Visit: Payer: Medicaid Other | Admitting: Physical Therapy

## 2017-08-02 ENCOUNTER — Encounter: Payer: Self-pay | Admitting: Physical Therapy

## 2017-08-07 ENCOUNTER — Encounter: Payer: Self-pay | Admitting: Physical Therapy

## 2017-08-09 ENCOUNTER — Encounter: Payer: Self-pay | Admitting: Physical Therapy

## 2017-09-25 ENCOUNTER — Encounter: Payer: Self-pay | Admitting: Gastroenterology

## 2017-09-25 ENCOUNTER — Other Ambulatory Visit (INDEPENDENT_AMBULATORY_CARE_PROVIDER_SITE_OTHER): Payer: Self-pay

## 2017-09-25 ENCOUNTER — Ambulatory Visit: Payer: Self-pay | Admitting: Gastroenterology

## 2017-09-25 VITALS — BP 100/70 | HR 84 | Ht 66.5 in | Wt 177.4 lb

## 2017-09-25 DIAGNOSIS — D509 Iron deficiency anemia, unspecified: Secondary | ICD-10-CM

## 2017-09-25 DIAGNOSIS — R195 Other fecal abnormalities: Secondary | ICD-10-CM

## 2017-09-25 LAB — CBC WITH DIFFERENTIAL/PLATELET
BASOS ABS: 0 10*3/uL (ref 0.0–0.1)
BASOS PCT: 0.8 % (ref 0.0–3.0)
EOS ABS: 0.1 10*3/uL (ref 0.0–0.7)
Eosinophils Relative: 2.7 % (ref 0.0–5.0)
HEMATOCRIT: 34.4 % — AB (ref 36.0–46.0)
HEMOGLOBIN: 11 g/dL — AB (ref 12.0–15.0)
Lymphocytes Relative: 35.6 % (ref 12.0–46.0)
Lymphs Abs: 1.6 10*3/uL (ref 0.7–4.0)
MCHC: 32.1 g/dL (ref 30.0–36.0)
MCV: 84 fl (ref 78.0–100.0)
MONO ABS: 0.5 10*3/uL (ref 0.1–1.0)
MONOS PCT: 11.3 % (ref 3.0–12.0)
Neutro Abs: 2.2 10*3/uL (ref 1.4–7.7)
Neutrophils Relative %: 49.6 % (ref 43.0–77.0)
Platelets: 277 10*3/uL (ref 150.0–400.0)
RBC: 4.1 Mil/uL (ref 3.87–5.11)
RDW: 15.4 % (ref 11.5–15.5)
WBC: 4.4 10*3/uL (ref 4.0–10.5)

## 2017-09-25 NOTE — Patient Instructions (Addendum)
You will have labs checked today in the basement lab.  Please head down after you check out with the front desk  (cbc).  Follow up as needed.  Stay on iron once daily.  Normal BMI (Body Mass Index- based on height and weight) is between 19 and 25. Your BMI today is Body mass index is 28.2 kg/m. [name] Please consider follow up  regarding your BMI with your Primary Care Provider.
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
## 2017-09-26 ENCOUNTER — Other Ambulatory Visit: Payer: Self-pay

## 2017-09-26 DIAGNOSIS — D509 Iron deficiency anemia, unspecified: Secondary | ICD-10-CM

## 2017-10-16 ENCOUNTER — Encounter: Payer: Self-pay | Admitting: Psychology

## 2017-10-16 ENCOUNTER — Ambulatory Visit: Payer: Self-pay | Admitting: Psychology

## 2017-10-16 DIAGNOSIS — F323 Major depressive disorder, single episode, severe with psychotic features: Secondary | ICD-10-CM

## 2017-10-16 DIAGNOSIS — R413 Other amnesia: Secondary | ICD-10-CM

## 2017-10-16 DIAGNOSIS — F4310 Post-traumatic stress disorder, unspecified: Secondary | ICD-10-CM
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
## 2017-10-17 ENCOUNTER — Encounter: Payer: Self-pay | Admitting: Psychology

## 2017-10-17 ENCOUNTER — Ambulatory Visit (INDEPENDENT_AMBULATORY_CARE_PROVIDER_SITE_OTHER): Payer: Self-pay | Admitting: Psychology

## 2017-10-17 DIAGNOSIS — R413 Other amnesia: Secondary | ICD-10-CM

## 2017-10-17 NOTE — Progress Notes (Signed)
   Neuropsychology Note  Santo Held returned today for 2 hours of neuropsychological testing with technician, Wallace Keller, BS, under the supervision of Dr. Elvis Coil. The patient did not appear overtly distressed by the testing session, per behavioral observation or via self-report to the technician. Rest breaks were offered. Julia Wheeler will return within 2 weeks for a feedback session with Dr. Alinda Dooms at which time her test performances, clinical impressions and treatment recommendations will be reviewed in detail. The patient understands she can contact our office should she require our assistance before this time.  Full report to follow.
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
## 2017-10-23 ENCOUNTER — Encounter: Payer: Self-pay | Admitting: Psychology
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
## 2017-10-26 ENCOUNTER — Encounter: Payer: Self-pay | Admitting: Psychology

## 2017-11-10 ENCOUNTER — Encounter (HOSPITAL_COMMUNITY): Payer: Self-pay | Admitting: *Deleted

## 2017-11-10 ENCOUNTER — Other Ambulatory Visit: Payer: Self-pay

## 2017-11-10 ENCOUNTER — Inpatient Hospital Stay (HOSPITAL_COMMUNITY)
Admission: EM | Admit: 2017-11-10 | Discharge: 2017-11-16 | DRG: 674 | Disposition: A | Discharge status: Home / Self Care | Payer: Self-pay | Attending: Family Medicine | Admitting: Family Medicine

## 2017-11-10 DIAGNOSIS — K561 Intussusception: Secondary | ICD-10-CM | POA: Diagnosis present

## 2017-11-10 DIAGNOSIS — F1010 Alcohol abuse, uncomplicated: Secondary | ICD-10-CM | POA: Diagnosis present

## 2017-11-10 DIAGNOSIS — R45851 Suicidal ideations: Secondary | ICD-10-CM | POA: Diagnosis not present

## 2017-11-10 DIAGNOSIS — Z885 Allergy status to narcotic agent status: Secondary | ICD-10-CM

## 2017-11-10 DIAGNOSIS — M47816 Spondylosis without myelopathy or radiculopathy, lumbar region: Secondary | ICD-10-CM | POA: Diagnosis present

## 2017-11-10 DIAGNOSIS — I5022 Chronic systolic (congestive) heart failure: Secondary | ICD-10-CM | POA: Diagnosis present

## 2017-11-10 DIAGNOSIS — N179 Acute kidney failure, unspecified: Principal | ICD-10-CM | POA: Diagnosis present

## 2017-11-10 DIAGNOSIS — R000 Tachycardia, unspecified: Secondary | ICD-10-CM

## 2017-11-10 DIAGNOSIS — I10 Essential (primary) hypertension: Secondary | ICD-10-CM | POA: Diagnosis present

## 2017-11-10 DIAGNOSIS — Z818 Family history of other mental and behavioral disorders: Secondary | ICD-10-CM

## 2017-11-10 DIAGNOSIS — E872 Acidosis, unspecified: Secondary | ICD-10-CM | POA: Diagnosis present

## 2017-11-10 DIAGNOSIS — Z888 Allergy status to other drugs, medicaments and biological substances status: Secondary | ICD-10-CM

## 2017-11-10 DIAGNOSIS — Z915 Personal history of self-harm: Secondary | ICD-10-CM

## 2017-11-10 DIAGNOSIS — D509 Iron deficiency anemia, unspecified: Secondary | ICD-10-CM | POA: Diagnosis present

## 2017-11-10 DIAGNOSIS — R55 Syncope and collapse: Secondary | ICD-10-CM

## 2017-11-10 DIAGNOSIS — K219 Gastro-esophageal reflux disease without esophagitis: Secondary | ICD-10-CM | POA: Diagnosis present

## 2017-11-10 DIAGNOSIS — K3189 Other diseases of stomach and duodenum: Secondary | ICD-10-CM | POA: Diagnosis present

## 2017-11-10 DIAGNOSIS — K599 Functional intestinal disorder, unspecified: Secondary | ICD-10-CM

## 2017-11-10 DIAGNOSIS — Z91030 Bee allergy status: Secondary | ICD-10-CM

## 2017-11-10 DIAGNOSIS — K5900 Constipation, unspecified: Secondary | ICD-10-CM | POA: Diagnosis present

## 2017-11-10 DIAGNOSIS — F333 Major depressive disorder, recurrent, severe with psychotic symptoms: Secondary | ICD-10-CM | POA: Diagnosis present

## 2017-11-10 DIAGNOSIS — N390 Urinary tract infection, site not specified: Secondary | ICD-10-CM | POA: Diagnosis present

## 2017-11-10 DIAGNOSIS — E876 Hypokalemia: Secondary | ICD-10-CM | POA: Diagnosis not present

## 2017-11-10 DIAGNOSIS — Z8249 Family history of ischemic heart disease and other diseases of the circulatory system: Secondary | ICD-10-CM

## 2017-11-10 DIAGNOSIS — Z9884 Bariatric surgery status: Secondary | ICD-10-CM

## 2017-11-10 DIAGNOSIS — E785 Hyperlipidemia, unspecified: Secondary | ICD-10-CM | POA: Diagnosis present

## 2017-11-10 DIAGNOSIS — F418 Other specified anxiety disorders: Secondary | ICD-10-CM | POA: Diagnosis present

## 2017-11-10 DIAGNOSIS — T730XXA Starvation, initial encounter: Secondary | ICD-10-CM | POA: Diagnosis present

## 2017-11-10 DIAGNOSIS — Z79891 Long term (current) use of opiate analgesic: Secondary | ICD-10-CM

## 2017-11-10 DIAGNOSIS — F332 Major depressive disorder, recurrent severe without psychotic features: Secondary | ICD-10-CM | POA: Diagnosis present

## 2017-11-10 DIAGNOSIS — R079 Chest pain, unspecified: Secondary | ICD-10-CM | POA: Diagnosis present

## 2017-11-10 DIAGNOSIS — Z8711 Personal history of peptic ulcer disease: Secondary | ICD-10-CM

## 2017-11-10 DIAGNOSIS — R7989 Other specified abnormal findings of blood chemistry: Secondary | ICD-10-CM

## 2017-11-10 DIAGNOSIS — G8929 Other chronic pain: Secondary | ICD-10-CM | POA: Diagnosis present

## 2017-11-10 DIAGNOSIS — I110 Hypertensive heart disease with heart failure: Secondary | ICD-10-CM | POA: Diagnosis present

## 2017-11-10 DIAGNOSIS — E860 Dehydration: Secondary | ICD-10-CM | POA: Diagnosis present

## 2017-11-10 DIAGNOSIS — R1012 Left upper quadrant pain: Secondary | ICD-10-CM | POA: Diagnosis present

## 2017-11-10 HISTORY — DX: Intussusception: K56.1

## 2017-11-10 LAB — BASIC METABOLIC PANEL
Anion gap: 18 — ABNORMAL HIGH (ref 5–15)
BUN: 33 mg/dL — ABNORMAL HIGH (ref 6–20)
CHLORIDE: 91 mmol/L — AB (ref 101–111)
CO2: 26 mmol/L (ref 22–32)
CREATININE: 2.59 mg/dL — AB (ref 0.44–1.00)
Calcium: 10 mg/dL (ref 8.9–10.3)
GFR, EST AFRICAN AMERICAN: 24 mL/min — AB (ref >60)
GFR, EST NON AFRICAN AMERICAN: 20 mL/min — AB (ref >60)
Glucose, Bld: 128 mg/dL — ABNORMAL HIGH (ref 65–99)
POTASSIUM: 3.6 mmol/L (ref 3.5–5.1)
SODIUM: 135 mmol/L (ref 135–145)

## 2017-11-10 LAB — I-STAT TROPONIN, ED: TROPONIN I, POC: 0.02 ng/mL (ref 0.00–0.08)

## 2017-11-10 LAB — I-STAT CG4 LACTIC ACID, ED
LACTIC ACID, VENOUS: 1.45 mmol/L (ref 0.5–1.9)
Lactic Acid, Venous: 3.88 mmol/L (ref 0.5–1.9)

## 2017-11-10 LAB — CBC
HEMATOCRIT: 38.5 % (ref 36.0–46.0)
Hemoglobin: 12.7 g/dL (ref 12.0–15.0)
MCH: 27.2 pg (ref 26.0–34.0)
MCHC: 33 g/dL (ref 30.0–36.0)
MCV: 82.4 fL (ref 78.0–100.0)
PLATELETS: 302 10*3/uL (ref 150–400)
RBC: 4.67 MIL/uL (ref 3.87–5.11)
RDW: 16.3 % — ABNORMAL HIGH (ref 11.5–15.5)
WBC: 6.9 10*3/uL (ref 4.0–10.5)

## 2017-11-10 LAB — I-STAT BETA HCG BLOOD, ED (MC, WL, AP ONLY): I-stat hCG, quantitative: <5 m[IU]/mL (ref <5)

## 2017-11-10 LAB — TYPE AND SCREEN
ABO/RH(D): O POS
Antibody Screen: NEGATIVE

## 2017-11-10 LAB — D-DIMER, QUANTITATIVE: D-Dimer, Quant: 3.89 ug/mL-FEU — ABNORMAL HIGH (ref 0.00–0.50)

## 2017-11-10 MED ORDER — HEPARIN (PORCINE) IN NACL 100-0.45 UNIT/ML-% IJ SOLN
1150.0000 [IU]/h | INTRAMUSCULAR | Status: DC
  Administered 2017-11-10: 1150 [IU]/h via INTRAVENOUS
  Filled 2017-11-10 (×2): qty 250

## 2017-11-10 MED ORDER — SODIUM CHLORIDE 0.9 % IV BOLUS (SEPSIS)
1000.0000 mL | Freq: Once | INTRAVENOUS | Status: AC
  Administered 2017-11-10: 1000 mL via INTRAVENOUS

## 2017-11-10 MED ORDER — HEPARIN BOLUS VIA INFUSION
4000.0000 [IU] | Freq: Once | INTRAVENOUS | Status: AC
  Administered 2017-11-10: 4000 [IU] via INTRAVENOUS
  Filled 2017-11-10: qty 4000

## 2017-11-10 NOTE — ED Notes (Signed)
Pt weight 72.7 kg
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
## 2017-11-10 NOTE — ED Notes (Signed)
Pharmacy contacted about heparin infusion.
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
## 2017-11-10 NOTE — ED Provider Notes (Signed)
Feeling weak, near syncopal for about a week.  Sharp chest pain when up and walking, very SOB.  Tachycardic here in the 130's-140's here on arrival.  Labs remarkable for AKI.  Did have a lactic acid elevated initially, cleared with some fluids.  Has had 2L total.  No infectious type symptoms.  Heparin started already for presumed PE.  Plan:  VQ scan.  Will need admission for AKI even if scan negative.  Results for orders placed or performed during the hospital encounter of 11/10/17  Basic metabolic panel  Result Value Ref Range   Sodium 135 135 - 145 mmol/L   Potassium 3.6 3.5 - 5.1 mmol/L   Chloride 91 (L) 101 - 111 mmol/L   CO2 26 22 - 32 mmol/L   Glucose, Bld 128 (H) 65 - 99 mg/dL   BUN 33 (H) 6 - 20 mg/dL   Creatinine, Ser 8.11 (H) 0.44 - 1.00 mg/dL   Calcium 57.2 8.9 - 62.0 mg/dL   GFR calc non Af Amer 20 (L) >60 mL/min   GFR calc Af Amer 24 (L) >60 mL/min   Anion gap 18 (H) 5 - 15  CBC  Result Value Ref Range   WBC 6.9 4.0 - 10.5 K/uL   RBC 4.67 3.87 - 5.11 MIL/uL   Hemoglobin 12.7 12.0 - 15.0 g/dL   HCT 35.5 97.4 - 16.3 %   MCV 82.4 78.0 - 100.0 fL   MCH 27.2 26.0 - 34.0 pg   MCHC 33.0 30.0 - 36.0 g/dL   RDW 84.5 (H) 36.4 - 68.0 %   Platelets 302 150 - 400 K/uL  D-dimer, quantitative (not at [facility])  Result Value Ref Range   D-Dimer, Quant 3.89 (H) 0.00 - 0.50 ug/mL-FEU  I-Stat beta hCG blood, ED  Result Value Ref Range   I-stat hCG, quantitative <5.0 <5 mIU/mL   Comment 3          I-Stat Troponin, ED (not at [facility])  Result Value Ref Range   Troponin i, poc 0.02 0.00 - 0.08 ng/mL   Comment 3          I-Stat CG4 Lactic Acid, ED  Result Value Ref Range   Lactic Acid, Venous 3.88 (HH) 0.5 - 1.9 mmol/L   Comment NOTIFIED PHYSICIAN   I-Stat CG4 Lactic Acid, ED  Result Value Ref Range   Lactic Acid, Venous 1.45 0.5 - 1.9 mmol/L  Type and screen [facility]  Result Value Ref Range   ABO/RH(D) O POS    Antibody Screen NEG    Sample Expiration 11/13/2017     Dg Chest 2 View  Result Date: 11/11/2017 CLINICAL DATA:  Acute onset of generalized chest pain and dry heaves. EXAM: CHEST  2 VIEW COMPARISON:  Chest radiograph performed 01/02/2017 FINDINGS: The lungs are well-aerated and clear. There is no evidence of focal opacification, pleural effusion or pneumothorax. The heart is normal in size; the mediastinal contour is within normal limits. No acute osseous abnormalities are seen. IMPRESSION: No acute cardiopulmonary process seen. Electronically Signed   By: Roanna Raider M.D.   On: 11/11/2017 00:44   Nm Pulmonary Vent And Perf (v/q Scan)  Result Date: 11/11/2017 CLINICAL DATA:  51 y/o F; PE suspected, intermediate probability, positive D-dimer. EXAM: NUCLEAR MEDICINE VENTILATION - PERFUSION LUNG SCAN TECHNIQUE: Ventilation images were obtained in multiple projections using inhaled aerosol Tc-70m DTPA. Perfusion images were obtained in multiple projections after intravenous injection of Tc-66m MAA. RADIOPHARMACEUTICALS:  32.5 mCi Technetium-37m DTPA  aerosol inhalation and 4.32 mCi Technetium-1243m MAA IV COMPARISON:  None. FINDINGS: Ventilation: No focal ventilation defect. Perfusion: No wedge shaped peripheral perfusion defects to suggest acute pulmonary embolism. IMPRESSION: Normal V/Q scan.  No evidence of pulmonary embolus. Electronically Signed   By: Mitzi HansenLance  Furusawa-Stratton M.D.   On: 11/11/2017 01:55    2:11 AM VQ scan negative.  Will stop heparin.  Admitted to hospitalist service for ongoing care.   Garlon HatchetSanders, Camia Dipinto M, PA-C 11/11/17 0246    Phillis HaggisMabe, Martha L, MD 11/11/17 [phone]

## 2017-11-10 NOTE — ED Notes (Signed)
Pt reports she was sitting in front of the toilet feeling nauseous when she got confused. Pt unable to tell me what day it was. Pt is alert. Pt is oriented x4. Pt reports son is at her baseline neurologically. Son reports she has felt weaker for several days.

## 2017-11-10 NOTE — ED Notes (Signed)
Attempted PIV x 1. No success at this time. 

## 2017-11-10 NOTE — ED Notes (Signed)
VQ Scan confirmed they can take pt in around an hour and a half.

## 2017-11-10 NOTE — ED Triage Notes (Addendum)
Pt reports having nausea and dizziness, feeling lightheaded and near syncopal for several days. Denies vomiting or diarrhea. Has mild sob. Reports left arm weakness for several days. Hx of anemia with blood transfusion, denies any recent dark stools or blood in stools.

## 2017-11-10 NOTE — ED Notes (Signed)
IV nurse at bedside,  She will try and get first set of blood cultures.

## 2017-11-10 NOTE — ED Notes (Signed)
Heparin verified with BRIAN RN

## 2017-11-10 NOTE — ED Notes (Addendum)
First set of blood cultures collected by IV nurse [facility].

## 2017-11-10 NOTE — ED Notes (Signed)
ED Provider at bedside. 

## 2017-11-10 NOTE — ED Notes (Signed)
Pt notified of need for urine sample. 

## 2017-11-11 ENCOUNTER — Inpatient Hospital Stay (HOSPITAL_COMMUNITY): Payer: Self-pay

## 2017-11-11 ENCOUNTER — Inpatient Hospital Stay (HOSPITAL_COMMUNITY): Payer: Self-pay | Admitting: Anesthesiology

## 2017-11-11 ENCOUNTER — Encounter (HOSPITAL_COMMUNITY): Payer: Self-pay | Admitting: Cardiology

## 2017-11-11 ENCOUNTER — Encounter (HOSPITAL_COMMUNITY): Payer: Self-pay

## 2017-11-11 ENCOUNTER — Encounter (HOSPITAL_COMMUNITY)
Admission: EM | Disposition: A | Discharge status: Home / Self Care | Payer: Self-pay | Source: Home / Self Care | Attending: Family Medicine

## 2017-11-11 ENCOUNTER — Emergency Department (HOSPITAL_COMMUNITY): Payer: Self-pay

## 2017-11-11 DIAGNOSIS — E872 Acidosis, unspecified: Secondary | ICD-10-CM | POA: Diagnosis present

## 2017-11-11 DIAGNOSIS — F418 Other specified anxiety disorders: Secondary | ICD-10-CM | POA: Insufficient documentation

## 2017-11-11 DIAGNOSIS — F1010 Alcohol abuse, uncomplicated: Secondary | ICD-10-CM

## 2017-11-11 DIAGNOSIS — N390 Urinary tract infection, site not specified: Secondary | ICD-10-CM | POA: Diagnosis present

## 2017-11-11 DIAGNOSIS — R079 Chest pain, unspecified: Secondary | ICD-10-CM | POA: Diagnosis present

## 2017-11-11 DIAGNOSIS — R1012 Left upper quadrant pain: Secondary | ICD-10-CM

## 2017-11-11 DIAGNOSIS — N179 Acute kidney failure, unspecified: Principal | ICD-10-CM

## 2017-11-11 DIAGNOSIS — I5022 Chronic systolic (congestive) heart failure: Secondary | ICD-10-CM

## 2017-11-11 DIAGNOSIS — K219 Gastro-esophageal reflux disease without esophagitis: Secondary | ICD-10-CM | POA: Diagnosis present

## 2017-11-11 DIAGNOSIS — K561 Intussusception: Secondary | ICD-10-CM

## 2017-11-11 DIAGNOSIS — I10 Essential (primary) hypertension: Secondary | ICD-10-CM

## 2017-11-11 HISTORY — PX: LAPAROSCOPY: SHX197

## 2017-11-11 HISTORY — DX: Intussusception: K56.1

## 2017-11-11 LAB — HEPATIC FUNCTION PANEL
ALT: 23 U/L (ref 14–54)
AST: 23 U/L (ref 15–41)
Albumin: 3.2 g/dL — ABNORMAL LOW (ref 3.5–5.0)
Alkaline Phosphatase: 73 U/L (ref 38–126)
BILIRUBIN INDIRECT: 0.8 mg/dL (ref 0.3–0.9)
Bilirubin, Direct: 0.1 mg/dL (ref 0.1–0.5)
TOTAL PROTEIN: 6.6 g/dL (ref 6.5–8.1)
Total Bilirubin: 0.9 mg/dL (ref 0.3–1.2)

## 2017-11-11 LAB — LIPID PANEL
CHOL/HDL RATIO: 4.3 ratio
CHOLESTEROL: 207 mg/dL — AB (ref 0–200)
HDL: 48 mg/dL (ref >40)
LDL Cholesterol: 136 mg/dL — ABNORMAL HIGH (ref 0–99)
TRIGLYCERIDES: 116 mg/dL (ref <150)
VLDL: 23 mg/dL (ref 0–40)

## 2017-11-11 LAB — GLUCOSE, CAPILLARY: Glucose-Capillary: 84 mg/dL (ref 65–99)

## 2017-11-11 LAB — RAPID URINE DRUG SCREEN, HOSP PERFORMED
Amphetamines: NOT DETECTED
BARBITURATES: NOT DETECTED
Benzodiazepines: NOT DETECTED
COCAINE: NOT DETECTED
OPIATES: NOT DETECTED
Tetrahydrocannabinol: NOT DETECTED

## 2017-11-11 LAB — URINALYSIS, ROUTINE W REFLEX MICROSCOPIC
BILIRUBIN URINE: NEGATIVE
GLUCOSE, UA: NEGATIVE mg/dL
Ketones, ur: 5 mg/dL — AB
Nitrite: NEGATIVE
PROTEIN: NEGATIVE mg/dL
Specific Gravity, Urine: 1.011 (ref 1.005–1.030)
pH: 5 (ref 5.0–8.0)

## 2017-11-11 LAB — TROPONIN I
TROPONIN I: 0.03 ng/mL — AB (ref <0.03)
Troponin I: 0.04 ng/mL (ref <0.03)
Troponin I: 0.04 ng/mL (ref <0.03)

## 2017-11-11 LAB — SURGICAL PCR SCREEN
MRSA, PCR: NEGATIVE
Staphylococcus aureus: NEGATIVE

## 2017-11-11 LAB — HEMOGLOBIN A1C
Hgb A1c MFr Bld: 5.8 % — ABNORMAL HIGH (ref 4.8–5.6)
Mean Plasma Glucose: 119.76 mg/dL

## 2017-11-11 LAB — BRAIN NATRIURETIC PEPTIDE: B NATRIURETIC PEPTIDE 5: 18.3 pg/mL (ref 0.0–100.0)

## 2017-11-11 LAB — LIPASE, BLOOD: LIPASE: 53 U/L — AB (ref 11–51)

## 2017-11-11 LAB — CBC
HEMATOCRIT: 34.7 % — AB (ref 36.0–46.0)
HEMOGLOBIN: 11.2 g/dL — AB (ref 12.0–15.0)
MCH: 26.8 pg (ref 26.0–34.0)
MCHC: 32.3 g/dL (ref 30.0–36.0)
MCV: 83 fL (ref 78.0–100.0)
Platelets: 220 10*3/uL (ref 150–400)
RBC: 4.18 MIL/uL (ref 3.87–5.11)
RDW: 16.5 % — AB (ref 11.5–15.5)
WBC: 6.8 10*3/uL (ref 4.0–10.5)

## 2017-11-11 LAB — CREATININE, URINE, RANDOM: Creatinine, Urine: 140.3 mg/dL

## 2017-11-11 SURGERY — LAPAROSCOPY, DIAGNOSTIC
Anesthesia: General | Site: Abdomen

## 2017-11-11 MED ORDER — ONDANSETRON HCL 4 MG/2ML IJ SOLN
INTRAMUSCULAR | Status: AC
  Filled 2017-11-11: qty 8

## 2017-11-11 MED ORDER — HYDRALAZINE HCL 20 MG/ML IJ SOLN: 5.0000 mg | INTRAMUSCULAR | Status: DC | PRN

## 2017-11-11 MED ORDER — ASPIRIN EC 325 MG PO TBEC
325.0000 mg | DELAYED_RELEASE_TABLET | Freq: Every day | ORAL | Status: DC
  Administered 2017-11-11: 325 mg via ORAL
  Filled 2017-11-11: qty 1

## 2017-11-11 MED ORDER — MIDAZOLAM HCL 5 MG/5ML IJ SOLN
INTRAMUSCULAR | Status: DC | PRN
  Administered 2017-11-11: 2 mg via INTRAVENOUS

## 2017-11-11 MED ORDER — VITAMIN B-1 100 MG PO TABS
100.0000 mg | ORAL_TABLET | Freq: Every day | ORAL | Status: DC
  Administered 2017-11-11 – 2017-11-16 (×6): 100 mg via ORAL
  Filled 2017-11-11 (×6): qty 1

## 2017-11-11 MED ORDER — LORAZEPAM 2 MG/ML IJ SOLN: 1.0000 mg | Freq: Four times a day (QID) | INTRAMUSCULAR | Status: AC | PRN

## 2017-11-11 MED ORDER — SUGAMMADEX SODIUM 200 MG/2ML IV SOLN
INTRAVENOUS | Status: DC | PRN
  Administered 2017-11-11: 300 mg via INTRAVENOUS

## 2017-11-11 MED ORDER — FENTANYL CITRATE (PF) 250 MCG/5ML IJ SOLN
INTRAMUSCULAR | Status: AC
  Filled 2017-11-11: qty 5

## 2017-11-11 MED ORDER — OXYCODONE HCL 5 MG PO TABS
5.0000 mg | ORAL_TABLET | Freq: Four times a day (QID) | ORAL | Status: DC | PRN
  Administered 2017-11-12: 5 mg via ORAL
  Filled 2017-11-11: qty 1

## 2017-11-11 MED ORDER — BUPIVACAINE-EPINEPHRINE 0.25% -1:200000 IJ SOLN
INTRAMUSCULAR | Status: AC
  Filled 2017-11-11: qty 1

## 2017-11-11 MED ORDER — ENOXAPARIN SODIUM 30 MG/0.3ML ~~LOC~~ SOLN
30.0000 mg | SUBCUTANEOUS | Status: DC
  Administered 2017-11-11: 30 mg via SUBCUTANEOUS
  Filled 2017-11-11: qty 0.3

## 2017-11-11 MED ORDER — HYDROXYZINE HCL 10 MG PO TABS: 10.0000 mg | ORAL_TABLET | Freq: Three times a day (TID) | ORAL | Status: DC | PRN

## 2017-11-11 MED ORDER — HYDROMORPHONE HCL 1 MG/ML IJ SOLN
0.2500 mg | INTRAMUSCULAR | Status: DC | PRN
  Administered 2017-11-11 (×2): 0.5 mg via INTRAVENOUS

## 2017-11-11 MED ORDER — ZOLPIDEM TARTRATE 5 MG PO TABS: 5.0000 mg | ORAL_TABLET | Freq: Every evening | ORAL | Status: DC | PRN

## 2017-11-11 MED ORDER — TRAZODONE HCL 100 MG PO TABS
100.0000 mg | ORAL_TABLET | Freq: Every day | ORAL | Status: DC
  Administered 2017-11-11 – 2017-11-15 (×5): 100 mg via ORAL
  Filled 2017-11-11 (×5): qty 1

## 2017-11-11 MED ORDER — DULOXETINE HCL 60 MG PO CPEP
90.0000 mg | ORAL_CAPSULE | Freq: Every day | ORAL | Status: DC
  Administered 2017-11-12 – 2017-11-16 (×5): 90 mg via ORAL
  Filled 2017-11-11 (×5): qty 1

## 2017-11-11 MED ORDER — IOPAMIDOL (ISOVUE-300) INJECTION 61%
INTRAVENOUS | Status: AC
  Filled 2017-11-11: qty 30

## 2017-11-11 MED ORDER — ACETAMINOPHEN 325 MG PO TABS
650.0000 mg | ORAL_TABLET | Freq: Four times a day (QID) | ORAL | Status: DC | PRN
  Administered 2017-11-11 – 2017-11-14 (×2): 650 mg via ORAL
  Filled 2017-11-11 (×2): qty 2

## 2017-11-11 MED ORDER — ONDANSETRON HCL 4 MG/2ML IJ SOLN
INTRAMUSCULAR | Status: DC | PRN
  Administered 2017-11-11: 4 mg via INTRAVENOUS

## 2017-11-11 MED ORDER — FENTANYL CITRATE (PF) 100 MCG/2ML IJ SOLN: 50.0000 ug | INTRAMUSCULAR | Status: DC | PRN

## 2017-11-11 MED ORDER — PROPOFOL 10 MG/ML IV BOLUS
INTRAVENOUS | Status: AC
  Filled 2017-11-11: qty 20

## 2017-11-11 MED ORDER — LORAZEPAM 1 MG PO TABS: 1.0000 mg | ORAL_TABLET | Freq: Four times a day (QID) | ORAL | Status: AC | PRN

## 2017-11-11 MED ORDER — SODIUM CHLORIDE 0.9 % IV SOLN
INTRAVENOUS | Status: DC
  Administered 2017-11-11 – 2017-11-12 (×4): via INTRAVENOUS

## 2017-11-11 MED ORDER — PANTOPRAZOLE SODIUM 40 MG PO TBEC
40.0000 mg | DELAYED_RELEASE_TABLET | Freq: Two times a day (BID) | ORAL | Status: DC
  Administered 2017-11-11 – 2017-11-16 (×10): 40 mg via ORAL
  Filled 2017-11-11 (×10): qty 1

## 2017-11-11 MED ORDER — PROMETHAZINE HCL 25 MG/ML IJ SOLN: 6.2500 mg | INTRAMUSCULAR | Status: DC | PRN

## 2017-11-11 MED ORDER — LIDOCAINE HCL (CARDIAC) 20 MG/ML IV SOLN
INTRAVENOUS | Status: DC | PRN
  Administered 2017-11-11: 60 mg via INTRAVENOUS

## 2017-11-11 MED ORDER — KETOROLAC TROMETHAMINE 30 MG/ML IJ SOLN: 30.0000 mg | Freq: Once | INTRAMUSCULAR | Status: DC | PRN

## 2017-11-11 MED ORDER — FOLIC ACID 1 MG PO TABS
1.0000 mg | ORAL_TABLET | Freq: Every day | ORAL | Status: DC
  Administered 2017-11-11 – 2017-11-16 (×6): 1 mg via ORAL
  Filled 2017-11-11 (×6): qty 1

## 2017-11-11 MED ORDER — 0.9 % SODIUM CHLORIDE (POUR BTL) OPTIME
TOPICAL | Status: DC | PRN
  Administered 2017-11-11: 1000 mL

## 2017-11-11 MED ORDER — BUPIVACAINE-EPINEPHRINE 0.25% -1:200000 IJ SOLN
INTRAMUSCULAR | Status: DC | PRN
  Administered 2017-11-11: 30 mL

## 2017-11-11 MED ORDER — ALBUTEROL SULFATE (2.5 MG/3ML) 0.083% IN NEBU: 2.5000 mg | INHALATION_SOLUTION | RESPIRATORY_TRACT | Status: DC | PRN

## 2017-11-11 MED ORDER — TECHNETIUM TO 99M ALBUMIN AGGREGATED
4.3200 | Freq: Once | INTRAVENOUS | Status: AC | PRN
  Administered 2017-11-11: 4.32 via INTRAVENOUS

## 2017-11-11 MED ORDER — LACTATED RINGERS IV SOLN
INTRAVENOUS | Status: DC | PRN
  Administered 2017-11-11: 19:00:00 via INTRAVENOUS

## 2017-11-11 MED ORDER — NITROGLYCERIN 0.4 MG SL SUBL: 0.4000 mg | SUBLINGUAL_TABLET | SUBLINGUAL | Status: DC | PRN

## 2017-11-11 MED ORDER — ATORVASTATIN CALCIUM 10 MG PO TABS
10.0000 mg | ORAL_TABLET | Freq: Every day | ORAL | Status: DC
Start: 2017-11-11 — End: 2017-11-16
  Administered 2017-11-11 – 2017-11-15 (×5): 10 mg via ORAL
  Filled 2017-11-11 (×5): qty 1

## 2017-11-11 MED ORDER — FENTANYL CITRATE (PF) 100 MCG/2ML IJ SOLN
INTRAMUSCULAR | Status: DC | PRN
  Administered 2017-11-11 (×2): 50 ug via INTRAVENOUS

## 2017-11-11 MED ORDER — SODIUM CHLORIDE 0.9 % IR SOLN
Status: DC | PRN
  Administered 2017-11-11: 1000 mL

## 2017-11-11 MED ORDER — MIDAZOLAM HCL 2 MG/2ML IJ SOLN
INTRAMUSCULAR | Status: AC
  Filled 2017-11-11: qty 2

## 2017-11-11 MED ORDER — THIAMINE HCL 100 MG/ML IJ SOLN
100.0000 mg | Freq: Every day | INTRAMUSCULAR | Status: DC
  Filled 2017-11-11: qty 2

## 2017-11-11 MED ORDER — CEFOTETAN DISODIUM-DEXTROSE 2-2.08 GM-%(50ML) IV SOLR
INTRAVENOUS | Status: AC
  Filled 2017-11-11: qty 50

## 2017-11-11 MED ORDER — ADULT MULTIVITAMIN W/MINERALS CH: 1.0000 | ORAL_TABLET | Freq: Every day | ORAL | Status: DC

## 2017-11-11 MED ORDER — BUPIVACAINE-EPINEPHRINE (PF) 0.25% -1:200000 IJ SOLN
INTRAMUSCULAR | Status: AC
  Filled 2017-11-11: qty 30

## 2017-11-11 MED ORDER — DEXTROSE 5 % IV SOLN
1.0000 g | INTRAVENOUS | Status: DC
  Filled 2017-11-11: qty 1

## 2017-11-11 MED ORDER — SUCCINYLCHOLINE CHLORIDE 20 MG/ML IJ SOLN
INTRAMUSCULAR | Status: DC | PRN
  Administered 2017-11-11: 100 mg via INTRAVENOUS

## 2017-11-11 MED ORDER — ROCURONIUM BROMIDE 100 MG/10ML IV SOLN
INTRAVENOUS | Status: DC | PRN
  Administered 2017-11-11: 50 mg via INTRAVENOUS

## 2017-11-11 MED ORDER — DM-GUAIFENESIN ER 30-600 MG PO TB12: 1.0000 | ORAL_TABLET | Freq: Two times a day (BID) | ORAL | Status: DC | PRN

## 2017-11-11 MED ORDER — LORAZEPAM 2 MG/ML IJ SOLN
0.0000 mg | Freq: Two times a day (BID) | INTRAMUSCULAR | Status: AC
Start: 2017-11-13 — End: 2017-11-15

## 2017-11-11 MED ORDER — HYDROMORPHONE HCL 1 MG/ML IJ SOLN
INTRAMUSCULAR | Status: AC
  Administered 2017-11-11: 0.5 mg via INTRAVENOUS
  Filled 2017-11-11: qty 1

## 2017-11-11 MED ORDER — ATORVASTATIN CALCIUM 10 MG PO TABS
10.0000 mg | ORAL_TABLET | Freq: Every day | ORAL | Status: DC
  Filled 2017-11-11: qty 1

## 2017-11-11 MED ORDER — CARVEDILOL 6.25 MG PO TABS
6.2500 mg | ORAL_TABLET | Freq: Two times a day (BID) | ORAL | Status: DC
  Administered 2017-11-11 – 2017-11-16 (×10): 6.25 mg via ORAL
  Filled 2017-11-11 (×10): qty 1

## 2017-11-11 MED ORDER — CLONAZEPAM 0.5 MG PO TABS
0.5000 mg | ORAL_TABLET | Freq: Two times a day (BID) | ORAL | Status: DC
  Administered 2017-11-11 – 2017-11-16 (×11): 0.5 mg via ORAL
  Filled 2017-11-11 (×11): qty 1

## 2017-11-11 MED ORDER — IRON 325 (65 FE) MG PO TABS: 1.0000 | ORAL_TABLET | Freq: Every day | ORAL | Status: DC

## 2017-11-11 MED ORDER — CYCLOBENZAPRINE HCL 10 MG PO TABS
10.0000 mg | ORAL_TABLET | Freq: Two times a day (BID) | ORAL | Status: DC
  Administered 2017-11-11 – 2017-11-16 (×11): 10 mg via ORAL
  Filled 2017-11-11 (×11): qty 1

## 2017-11-11 MED ORDER — LORATADINE 10 MG PO TABS
10.0000 mg | ORAL_TABLET | Freq: Every day | ORAL | Status: DC
  Filled 2017-11-11: qty 1

## 2017-11-11 MED ORDER — TECHNETIUM TC 99M DIETHYLENETRIAME-PENTAACETIC ACID
32.5000 | Freq: Once | INTRAVENOUS | Status: AC | PRN
  Administered 2017-11-11: 32.5 via RESPIRATORY_TRACT

## 2017-11-11 MED ORDER — SERTRALINE HCL 100 MG PO TABS
100.0000 mg | ORAL_TABLET | Freq: Every day | ORAL | Status: DC
  Administered 2017-11-12 – 2017-11-16 (×5): 100 mg via ORAL
  Filled 2017-11-11 (×6): qty 1

## 2017-11-11 MED ORDER — PROPOFOL 10 MG/ML IV BOLUS
INTRAVENOUS | Status: DC | PRN
  Administered 2017-11-11: 150 mg via INTRAVENOUS

## 2017-11-11 MED ORDER — DEXTROSE 5 % IV SOLN
1.0000 g | INTRAVENOUS | Status: DC
  Administered 2017-11-11: 1 g via INTRAVENOUS
  Filled 2017-11-11: qty 10

## 2017-11-11 MED ORDER — LORAZEPAM 2 MG/ML IJ SOLN
0.0000 mg | Freq: Four times a day (QID) | INTRAMUSCULAR | Status: AC
  Administered 2017-11-12 (×2): 2 mg via INTRAVENOUS
  Filled 2017-11-11 (×2): qty 1

## 2017-11-11 MED ORDER — FENTANYL CITRATE (PF) 100 MCG/2ML IJ SOLN
50.0000 ug | Freq: Once | INTRAMUSCULAR | Status: AC
  Administered 2017-11-11: 50 ug via INTRAVENOUS
  Filled 2017-11-11: qty 2

## 2017-11-11 SURGICAL SUPPLY — 34 items
APPLIER CLIP 5 13 M/L LIGAMAX5 (MISCELLANEOUS)
BINDER ABDOMINAL 12 ML 46-62 (SOFTGOODS) IMPLANT
CHLORAPREP W/TINT 26ML (MISCELLANEOUS) ×2 IMPLANT
CLIP APPLIE 5 13 M/L LIGAMAX5 (MISCELLANEOUS) IMPLANT
COVER SURGICAL LIGHT HANDLE (MISCELLANEOUS) ×2 IMPLANT
DECANTER SPIKE VIAL GLASS SM (MISCELLANEOUS) ×2 IMPLANT
DERMABOND ADVANCED (GAUZE/BANDAGES/DRESSINGS) ×1
DERMABOND ADVANCED .7 DNX12 (GAUZE/BANDAGES/DRESSINGS) ×1 IMPLANT
DEVICE SECURE STRAP 25 ABSORB (INSTRUMENTS) IMPLANT
ELECT REM PT RETURN 9FT ADLT (ELECTROSURGICAL) ×2
ELECTRODE REM PT RTRN 9FT ADLT (ELECTROSURGICAL) ×1 IMPLANT
GLOVE BIO SURGEON STRL SZ7 (GLOVE) ×2 IMPLANT
GLOVE BIOGEL PI IND STRL 7.0 (GLOVE) ×1 IMPLANT
GLOVE BIOGEL PI INDICATOR 7.0 (GLOVE) ×1
GOWN STRL REUS W/TWL LRG LVL3 (GOWN DISPOSABLE) ×6 IMPLANT
GRASPER SUT TROCAR 14GX15 (MISCELLANEOUS) IMPLANT
KIT BASIN OR (CUSTOM PROCEDURE TRAY) ×2 IMPLANT
MARKER SKIN DUAL TIP RULER LAB (MISCELLANEOUS) IMPLANT
SCISSORS LAP 5X35 DISP (ENDOMECHANICALS) ×2 IMPLANT
SET IRRIG TUBING LAPAROSCOPIC (IRRIGATION / IRRIGATOR) ×2 IMPLANT
SHEARS HARMONIC ACE PLUS 36CM (ENDOMECHANICALS) IMPLANT
SLEEVE ENDOPATH XCEL 5M (ENDOMECHANICALS) ×2 IMPLANT
SUT MNCRL AB 4-0 PS2 18 (SUTURE) ×2 IMPLANT
SUT NOVA NAB GS-21 0 18 T12 DT (SUTURE) ×2 IMPLANT
SUT PROLENE 0 CT 1 CR/8 (SUTURE) IMPLANT
SYS LAPSCP GELPORT 120MM (MISCELLANEOUS) ×2
SYSTEM LAPSCP GELPORT 120MM (MISCELLANEOUS) ×1 IMPLANT
TOWEL OR 17X24 6PK STRL BLUE (TOWEL DISPOSABLE) IMPLANT
TOWEL OR 17X26 10 PK STRL BLUE (TOWEL DISPOSABLE) ×2 IMPLANT
TRAY LAPAROSCOPIC (CUSTOM PROCEDURE TRAY) ×2 IMPLANT
TROCAR XCEL 12X100 BLDLESS (ENDOMECHANICALS) IMPLANT
TROCAR XCEL NON-BLD 11X100MML (ENDOMECHANICALS) IMPLANT
TROCAR XCEL NON-BLD 5MMX100MML (ENDOMECHANICALS) ×4 IMPLANT
TUBING INSUFFLATION (TUBING) ×2 IMPLANT

## 2017-11-11 NOTE — Anesthesia Postprocedure Evaluation (Signed)
Anesthesia Post Note  Patient: Julia Wheeler  Procedure(s) Performed: LAPAROSCOPY DIAGNOSTIC (N/A Abdomen)     Patient location during evaluation: PACU Anesthesia Type: General Level of consciousness: awake and alert Pain management: pain level controlled Vital Signs Assessment: post-procedure vital signs reviewed and stable Respiratory status: spontaneous breathing, nonlabored ventilation, respiratory function stable and patient connected to nasal cannula oxygen Cardiovascular status: blood pressure returned to baseline and stable Postop Assessment: no apparent nausea or vomiting Anesthetic complications: no    Last Vitals:  Vitals:   11/11/17 1930 11/11/17 1945  BP: 118/85   Pulse: 85   Resp: 14   Temp:  36.5 C  SpO2: 100%     Last Pain:  Vitals:   11/11/17 1608  TempSrc:   PainSc: 0-No pain                 Kynzleigh Bandel S
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
## 2017-11-11 NOTE — ED Notes (Signed)
[facility] MD aware of need to take out NG tube.

## 2017-11-11 NOTE — Progress Notes (Signed)
PROGRESS NOTE  Julia Wheeler KLK:[id number] DOB: 1966-10-09 DOA: 11/10/2017 PCP: Oneta Rack, NP  Brief Narrative: 51yow PMH LVEF 45% presented with central abdominal pain 2-3 days with nausea, dry heaves; decreased PO intake; episode of CP in ED (per patient). EDP documentation notable for pt c/o several days of CP, SOB. Admitted for CP eval, abd pain. Subsequent CT revealed jejunal intussusception. Surgery consulted.  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 S/p gastric bypass 2005; s/p diag lap 2008 for SBO, incarcerated internal hernias x2; s/p diag lap 2015 for possible jejunal intussusception.   - normal WBC, lactate cleared, hemodynamics stable - analgesia as needed - NPO. Spoke with Dr. Sheliah Hatch who will see in consult.  AKI - likely from poor oral intake but concerning given above. - IVF, serial BMP, but watch I/O with h/o CHF.  Chest pain, elevated troponin - EKG nonacute; pain resolved; no h/o CAD noted - trend troponin, doubt ACS - discussed with Dr. Donnie Aho; will follow troponins; unless elevate or has recurrent pain will hold off on consult for now.  Borderline prolonged QTc - EKG in AM  Chronic systolic CHF, LVEF 45-50%, diffuse hypokinesis on echo 01/2016. - appears compensated; follow I/O  Sickle cell trait  Pyuria - completely asymptomatic, therefore will not treat   Possible alcohol abuse - CIWA; no evidence of withdrawal  DVT prophylaxis: SCDs Code Status: full Family Communication: none Disposition Plan: home    Brendia Sacks, MD  [facility] Direct contact: [phone] --Via amion app OR  --[email or web address]; password [email or web address]  7PM-7AM contact night coverage as above 11/11/2017, 10:31 AM  LOS: 0 days   Consultants:  General surgery  Procedures:    Antimicrobials:    Interval history/Subjective: Continues to have mid abd pain, severe; CP after arrival to ED has essential resolved. No  vomiting.  Objective: Vitals:  Vitals:   11/11/17 0930 11/11/17 1000  BP: (!) 116/91 (!) 118/92  Pulse: 94 93  Resp: 16 11  Temp:    SpO2: 100% 100%    Exam:  Constitutional:  . Appears calm, uncomfortable; ill but not toxic Eyes:  . pupils and irises appear normal . Normal lids  ENMT:  . grossly normal hearing  . Lips appear normal Respiratory:  . CTA bilaterally, no w/r/r.  . Respiratory effort normal.  Cardiovascular:  . RRR, no m/r/g . No LE extremity edema   Abdomen:  . Soft, no rebound, no guarding, moderate/severe mid-abdomen tenderness Musculoskeletal:  o BUE, BLE strength and tone normal, no atrophy, no abnormal movements Skin:  . No rashes, lesions, ulcers . palpation of skin: no induration or nodules Psychiatric:  . Mental status o Mood, affect appropriate . judgement and insight appear normal    I have personally reviewed the following:   Labs:  Creatinine 2.59 >>  Troponin .04 >>   CBC unremarkable  U/a positive  Imaging studies:  CXR independent review NAD  CT noted, intussusception jejunum  Medical tests:  EKG independent review 1904: ST, prolonged QT; 0537 ST, borderline prolonged QT   Review and summation of old records:  As above under first problem  Scheduled Meds: . aspirin EC  325 mg Oral Daily  . atorvastatin  10 mg Oral Daily  . carvedilol  6.25 mg Oral BID WC  . clonazePAM  0.5 mg Oral BID  . cyclobenzaprine  10 mg Oral BID  . DULoxetine  90 mg Oral Daily  . folic acid  1 mg Oral Daily  .  iopamidol      . LORazepam  0-4 mg Intravenous Q6H   Followed by  . [START ON 11/13/2017] LORazepam  0-4 mg Intravenous Q12H  . pantoprazole  40 mg Oral BID AC  . sertraline  100 mg Oral Daily  . thiamine  100 mg Oral Daily   Or  . thiamine  100 mg Intravenous Daily  . traZODone  100 mg Oral QHS   Continuous Infusions: . sodium chloride 100 mL/hr at 11/11/17 [id number]    Principal Problem:   Jejunal intussusception  [facility](HCC) Active Problems:   Abdominal pain, left upper quadrant   Iron deficiency anemia   Essential hypertension   MDD (major depressive disorder), recurrent, severe, with psychosis (HCC)   Chronic left systolic heart failure (HCC)   Chest pain   Acute kidney injury (HCC)   GERD (gastroesophageal reflux disease)   Alcohol abuse   LOS: 0 days     Prolonged services 9:30-10:30 direct contact; coordination of care with surgeon and discussion with cardiology

## 2017-11-11 NOTE — Transfer of Care (Signed)
Immediate Anesthesia Transfer of Care Note  Patient: Julia Wheeler  Procedure(s) Performed: LAPAROSCOPY DIAGNOSTIC (N/A Abdomen)  Patient Location: PACU  Anesthesia Type:General  Level of Consciousness: awake, sedated and patient cooperative  Airway & Oxygen Therapy: Patient connected to nasal cannula oxygen  Post-op Assessment: Report given to RN and Post -op Vital signs reviewed and stable  Post vital signs: Reviewed and stable  Last Vitals:  Vitals:   11/11/17 1532 11/11/17 1910  BP: 112/84 (P) 108/78  Pulse: 92 (P) 93  Resp: 18 (P) 15  Temp: 36.9 C (!) (P) 36.1 C  SpO2: 100% (P) 100%    Last Pain:  Vitals:   11/11/17 1608  TempSrc:   PainSc: 0-No pain         Complications: No apparent anesthesia complications

## 2017-11-11 NOTE — Anesthesia Preprocedure Evaluation (Signed)
Anesthesia Evaluation  Patient identified by MRN, date of birth, ID band Patient awake    Reviewed: Allergy & Precautions, NPO status , Patient's Chart, lab work & pertinent test results  Airway Mallampati: II  TM Distance: >3 FB Neck ROM: Full    Dental no notable dental hx.    Pulmonary neg pulmonary ROS,    Pulmonary exam normal breath sounds clear to auscultation       Cardiovascular hypertension, Normal cardiovascular exam Rhythm:Regular Rate:Normal     Neuro/Psych negative neurological ROS  negative psych ROS   GI/Hepatic Neg liver ROS, GERD  ,  Endo/Other  negative endocrine ROS  Renal/GU negative Renal ROS  negative genitourinary   Musculoskeletal negative musculoskeletal ROS (+)   Abdominal   Peds negative pediatric ROS (+)  Hematology  (+) Sickle cell trait ,   Anesthesia Other Findings   Reproductive/Obstetrics negative OB ROS                             Anesthesia Physical Anesthesia Plan  ASA: II and emergent  Anesthesia Plan: General   Post-op Pain Management:    Induction: Intravenous and Rapid sequence  PONV Risk Score and Plan: Ondansetron, Dexamethasone and Treatment may vary due to age or medical condition  Airway Management Planned: Oral ETT  Additional Equipment:   Intra-op Plan:   Post-operative Plan: Extubation in OR  Informed Consent: I have reviewed the patients History and Physical, chart, labs and discussed the procedure including the risks, benefits and alternatives for the proposed anesthesia with the patient or authorized representative who has indicated his/her understanding and acceptance.   Dental advisory given  Plan Discussed with: CRNA and Surgeon  Anesthesia Plan Comments:         Anesthesia Quick Evaluation
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
## 2017-11-11 NOTE — Progress Notes (Signed)
Attempt was made to contact son by phone number in system, but no one answered

## 2017-11-11 NOTE — ED Notes (Signed)
Niu, MD at bedside. °

## 2017-11-11 NOTE — Op Note (Signed)
Preoperative diagnosis: small intestine intussusception  Postoperative diagnosis: hyperactive small intestine  Procedure: diagnostic laparoscopy  Surgeon: Feliciana Rossetti, M.D.  Asst: noen  Anesthesia: general  Indications for procedure: Julia Wheeler is a 51 y.o. year old female with symptoms of abdominal pain and wretching. NG was attempted to insert and multiple additional clinical exams were had to watch for improvement of pain. She failed to improve after 8h so she was brought to the operating room for likely repair of intussusception.  Description of procedure: The patient was brought into the operative suite. Anesthesia was administered with General endotracheal anesthesia. WHO checklist was applied. The patient was then placed in supine position. The area was prepped and draped in the usual sterile fashion.  A 64mm incision was made in the right upper quadrant. Optical entry 76mm trocar was used to gain access to the abdominal cavity. Pneumoperitoneum was applied with high flow and low pressure. Laparoscope was reinserted to confirm placement. On initial view the bowel was moderately dilated. 2 additional trocars were inserted in the right mid and right lower abdomen. No adhesions to the abdominal wall were present. An attempt was made to identify the terminal ileum but since the laparoscope was on the right this proved difficult. Therefore the jejuno-jejunal anastomosis of the Roux bypass was identified. The alimentery limb was run back to the GJ and appeared normal. The biliary limb was run retrograde and appeared normal. The common channel was run antegrade and did have some areas of dilation followed by smooth muscle spasm areas but no intussusception was identified. The common channel was run all the way to the ligament of Trietz.  Since no intussusception was found, the procedure was stopped. Pneumoperitoneum was evacuated. 38ml of marcaine was administered to the areas of the incision.  4-0 monocryl was used to close the skin. The patient awoke from anesthesia and was brought to pacu in stable condition.  Findings: normal gastric bypass anatomy. Moderate dilation of small intestine. A few areas of dilation followed by hyperactive contracted smooth muscle. No intussusception  Specimen: none  Implant: none   Blood loss: <58ml  Local anesthesia: 52ml of Marcaine  Complications: none  Feliciana Rossetti, M.D. General, Bariatric, & Minimally Invasive Surgery [facility]

## 2017-11-11 NOTE — ED Notes (Signed)
Pt transported for VQ scan. 

## 2017-11-11 NOTE — ED Notes (Signed)
Patient transported to X-ray 

## 2017-11-11 NOTE — Progress Notes (Signed)
No improvement in abdominal pain. NG placed into esophagus proximal stomach but did not drain anything, removed for kinking. Will proceed with diagnostic laparoscopy, possible open procedure, possible bowel resection. Patient showed good understanding and agrees to proceed.

## 2017-11-11 NOTE — Anesthesia Procedure Notes (Signed)
Procedure Name: Intubation Date/Time: 11/11/2017 5:54 PM Performed by: Clearnce Sorrel, CRNA Pre-anesthesia Checklist: Patient identified, Emergency Drugs available, Suction available, Patient being monitored and Timeout performed Patient Re-evaluated:Patient Re-evaluated prior to induction Oxygen Delivery Method: Circle system utilized Preoxygenation: Pre-oxygenation with 100% oxygen Induction Type: IV induction, Rapid sequence and Cricoid Pressure applied Laryngoscope Size: Mac and 3 Grade View: Grade III Tube type: Oral Tube size: 7.0 mm Number of attempts: 2 Airway Equipment and Method: Stylet and Bougie stylet Placement Confirmation: positive ETCO2 and breath sounds checked- equal and bilateral Secured at: 23 cm Tube secured with: Tape Dental Injury: Teeth and Oropharynx as per pre-operative assessment

## 2017-11-12 ENCOUNTER — Encounter (HOSPITAL_COMMUNITY): Payer: Self-pay | Admitting: General Surgery

## 2017-11-12 ENCOUNTER — Inpatient Hospital Stay (HOSPITAL_COMMUNITY): Payer: Self-pay

## 2017-11-12 DIAGNOSIS — R7989 Other specified abnormal findings of blood chemistry: Secondary | ICD-10-CM

## 2017-11-12 DIAGNOSIS — F332 Major depressive disorder, recurrent severe without psychotic features: Secondary | ICD-10-CM

## 2017-11-12 DIAGNOSIS — R9431 Abnormal electrocardiogram [ECG] [EKG]: Secondary | ICD-10-CM

## 2017-11-12 DIAGNOSIS — G4700 Insomnia, unspecified: Secondary | ICD-10-CM

## 2017-11-12 DIAGNOSIS — R110 Nausea: Secondary | ICD-10-CM

## 2017-11-12 DIAGNOSIS — R45851 Suicidal ideations: Secondary | ICD-10-CM

## 2017-11-12 DIAGNOSIS — I361 Nonrheumatic tricuspid (valve) insufficiency: Secondary | ICD-10-CM

## 2017-11-12 DIAGNOSIS — K599 Functional intestinal disorder, unspecified: Secondary | ICD-10-CM

## 2017-11-12 DIAGNOSIS — Z818 Family history of other mental and behavioral disorders: Secondary | ICD-10-CM

## 2017-11-12 DIAGNOSIS — Z560 Unemployment, unspecified: Secondary | ICD-10-CM

## 2017-11-12 LAB — CBC
HCT: 28.6 % — ABNORMAL LOW (ref 36.0–46.0)
HEMOGLOBIN: 9.1 g/dL — AB (ref 12.0–15.0)
MCH: 26.5 pg (ref 26.0–34.0)
MCHC: 31.8 g/dL (ref 30.0–36.0)
MCV: 83.4 fL (ref 78.0–100.0)
Platelets: 190 10*3/uL (ref 150–400)
RBC: 3.43 MIL/uL — AB (ref 3.87–5.11)
RDW: 16.6 % — ABNORMAL HIGH (ref 11.5–15.5)
WBC: 4.6 10*3/uL (ref 4.0–10.5)

## 2017-11-12 LAB — COMPREHENSIVE METABOLIC PANEL
ALK PHOS: 59 U/L (ref 38–126)
ALT: 18 U/L (ref 14–54)
ANION GAP: 13 (ref 5–15)
AST: 19 U/L (ref 15–41)
Albumin: 2.6 g/dL — ABNORMAL LOW (ref 3.5–5.0)
BUN: 19 mg/dL (ref 6–20)
CALCIUM: 8.5 mg/dL — AB (ref 8.9–10.3)
CO2: 26 mmol/L (ref 22–32)
Chloride: 98 mmol/L — ABNORMAL LOW (ref 101–111)
Creatinine, Ser: 1.81 mg/dL — ABNORMAL HIGH (ref 0.44–1.00)
GFR calc non Af Amer: 31 mL/min — ABNORMAL LOW (ref >60)
GFR, EST AFRICAN AMERICAN: 36 mL/min — AB (ref >60)
Glucose, Bld: 82 mg/dL (ref 65–99)
Potassium: 3.1 mmol/L — ABNORMAL LOW (ref 3.5–5.1)
SODIUM: 137 mmol/L (ref 135–145)
TOTAL PROTEIN: 5.4 g/dL — AB (ref 6.5–8.1)
Total Bilirubin: 1 mg/dL (ref 0.3–1.2)

## 2017-11-12 LAB — GLUCOSE, CAPILLARY
GLUCOSE-CAPILLARY: 71 mg/dL (ref 65–99)
Glucose-Capillary: 76 mg/dL (ref 65–99)

## 2017-11-12 LAB — UREA NITROGEN, URINE: Urea Nitrogen, Ur: 461 mg/dL

## 2017-11-12 LAB — ECHOCARDIOGRAM COMPLETE
HEIGHTINCHES: 67 in
WEIGHTICAEL: 2652.57 [oz_av]

## 2017-11-12 MED ORDER — SODIUM CHLORIDE 0.9% FLUSH: 3.0000 mL | INTRAVENOUS | Status: DC | PRN

## 2017-11-12 MED ORDER — SODIUM CHLORIDE 0.9 % IV SOLN: 250.0000 mL | INTRAVENOUS | Status: DC | PRN

## 2017-11-12 MED ORDER — TAB-A-VITE/IRON PO TABS
1.0000 | ORAL_TABLET | Freq: Every day | ORAL | Status: DC
  Administered 2017-11-12 – 2017-11-16 (×4): 1 via ORAL
  Filled 2017-11-12 (×5): qty 1

## 2017-11-12 MED ORDER — MENTHOL 3 MG MT LOZG
1.0000 | LOZENGE | OROMUCOSAL | Status: DC | PRN
  Filled 2017-11-12: qty 9

## 2017-11-12 MED ORDER — LACTATED RINGERS IV BOLUS (SEPSIS): 1000.0000 mL | Freq: Three times a day (TID) | INTRAVENOUS | Status: AC | PRN

## 2017-11-12 MED ORDER — SODIUM CHLORIDE 0.9% FLUSH
3.0000 mL | Freq: Two times a day (BID) | INTRAVENOUS | Status: DC
  Administered 2017-11-12 – 2017-11-16 (×8): 3 mL via INTRAVENOUS

## 2017-11-12 NOTE — Consult Note (Signed)
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 Content:  Logical  Suicidal Thoughts:  Yes.  without intent/plan  Homicidal Thoughts:  No  Memory:  Immediate;   Good Recent;   Good Remote;   Good  Judgement:  Good  Insight:  Good  Psychomotor Activity:  Decreased with psychomotor slowing.  Concentration:  Concentration: Good and Attention Span: Good  Recall:  Good  Fund of Knowledge:  Good  Language:  Good  Akathisia:  No  Handed:  Right  AIMS (if indicated):   N/A  Assets:  Communication Skills Desire for Improvement Financial Resources/Insurance Housing Social Support  ADL's:  Intact  Cognition:  WNL  Sleep:   Poor    Assessment: Julia Wheeler is a 51 y.o. 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 Patient reports  depressed mood, chronic intermittent SI without plan or intention and neurovegetative symptoms. She has not been compliant with medications due to being unable to afford medications. She was asked about inpatient hospitalization but feels safe returning home and would like to follow up with [facility]. She is agreeable to speaking to SW about her psychosocial issues.   Treatment Plan Summary: -Continue home medications.  -Please have unit SW speak to patient about current psychosocial issues and potential financial assistance to afford medications and transportation to doctor's appointments.  -Patient should follow up with her outpatient psychiatrist.  -Patient is psychiatrically cleared. Psychiatry will sign off patient at this time. Please consult psychiatry again as needed.   Disposition: No evidence of imminent risk to self or others at present.   Patient does not meet criteria for psychiatric inpatient admission.  Faythe Dingwall, DO 11/12/2017 11:19 AM

## 2017-11-12 NOTE — Progress Notes (Signed)
   11/12/17 1300  Clinical Encounter Type  Visited With Patient not available  Visit Type Follow-up  Referral From Nurse  Consult/Referral To Chaplain  Chaplain attempted to visit with the PT concerning suicidal thoughts.  The PT was not in room when the chaplain arrived and had gone down for a test.  Clearing the consult from the system and will follow up

## 2017-11-12 NOTE — Care Management Note (Signed)
Case Management Note  Patient Details  Name: Julia Wheeler MRN: [id number] Date of Birth: Feb 23, 1966  Subjective/Objective:   Admitted with abdominal pain, near syncope.              Action/Plan: In to speak with Patient.  Prior to admission patient lived at home with son.  PCP Hillery Jacks;  Uses [facility] in [place], [place].  Patient denies inability to afford Food/nutrition, stated she has Food stamps.  Home DME: None.  Uses [employer] for transportation for shopping and medical appointments.  Admits to issues to afford medication.   NCM will continue to follow for discharge needs.  Expected Discharge Date: 11/13/2017                 Expected Discharge Plan:  Home/Self Care  Discharge planning Services  CM Consult  Status of Service:  In process, will continue to follow  Cloyde Reams, BSN, RN  Case Manager-Orientation [phone] 11/12/2017, 1:59 PM

## 2017-11-12 NOTE — Clinical Social Work Note (Signed)
CSW acknowledges consult regarding difficulty paying for medications. Will notify RNCM in morning progression meeting.  CSW signing off. Consult again if any social work needs arise.  Charlynn Court, CSW [phone]

## 2017-11-12 NOTE — Progress Notes (Signed)
PROGRESS NOTE  Julia Wheeler [id number] DOB: 11/18/1965 DOA: 11/10/2017 PCP: Oneta RackLewis, Tanika N, NP  Brief Narrative: 51yow PMH LVEF 45% presented with central abdominal pain 2-3 days with nausea, dry heaves; decreased PO intake; episode of CP in ED (per patient). EDP documentation notable for pt c/o several days of CP, SOB. Admitted for CP eval, abd pain. Subsequent CT revealed jejunal intussusception. Surgery consulted.  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 S/p gastric bypass 2005; s/p diag lap 2008 for SBO, incarcerated internal hernias x2; s/p diag lap 2015 for possible jejunal intussusception.   - s/p diagnostic laparoscopy as above - management as per surgery  AKI - secondary to poor oral intake; responding nicely to IVF; no peripheral edema, monitor I/O wit h CHF - slow fluids down, check BMP in AM  Chest pain, elevated troponin - EKG nonacute; pain resolved; telemetry SR, troponins flat; ACS ruled out; symptoms secondary to above; no further evaluation suggested - d/c telemetry  Borderline prolonged QTc - EKG   Suicidal ideation - denies today; sitter at bedside; will consult psychiatry for further recs.  Chronic systolic CHF, LVEF 45-50%, diffuse hypokinesis on echo 01/2016. - appears compensated 12/31; follow I/O  Sickle cell trait  Asymptomatic bacturia - completely asymptomatic, therefore will not treat   Possible alcohol abuse - CIWA; no evidence of withdrawal 12/31  DVT prophylaxis: SCDs Code Status: full Family Communication: none Disposition Plan: home pending improved renal fxn, clearance from surgery and psychiatry    Brendia Sacksaniel Goodrich, MD  [facility] Direct contact: [phone] --Via amion app OR  --[email or web address]; password [email or web address]  7PM-7AM contact night coverage as above 11/12/2017, 10:59 AM  LOS: 1 day   Consultants:  General  surgery  Procedures: 12/30 diagnostic laparoscopy  Antimicrobials:    Interval history/Subjective: Feels better today, mild abd pain, no CP. Confirms report to RN yesterday of SI, but no plan; long h/o depression, [facility]; no SI today.  Objective: Vitals:  Vitals:   11/12/17 0700 11/12/17 0730  BP: 100/71 102/63  Pulse: 89 89  Resp:  14  Temp:  98.4 F (36.9 C)  SpO2:  100%    Exam:   Constitutional:   . Appears calm and comfortable Eyes:  . pupils and irises appear normal . Normal lids  ENMT:  . grossly normal hearing  Respiratory:  . CTA bilaterally, no w/r/r.  . Respiratory effort normal.  Cardiovascular:  . RRR, no m/r/g . No LE extremity edema   Telemetry SR Abdomen:  . soft Skin:  . No rashes, lesions, ulcers Psychiatric:  . Mental status o Mood depressed, affect flat; speech quiet; psychomotor retardation noted . judgement and insight appear intact   I have personally reviewed the following:   Labs:  Creatinine 2.59 >> 1.81  LFTs unremarkable  Troponin .04 >> .04 >> .03   Hgb 11.2 >> 9.1   Scheduled Meds: . atorvastatin  10 mg Oral QHS  . carvedilol  6.25 mg Oral BID WC  . clonazePAM  0.5 mg Oral BID  . cyclobenzaprine  10 mg Oral BID  . DULoxetine  90 mg Oral Daily  . folic acid  1 mg Oral Daily  . LORazepam  0-4 mg Intravenous Q6H   Followed by  . [START ON 11/13/2017] LORazepam  0-4 mg Intravenous Q12H  . pantoprazole  40 mg Oral BID AC  . sertraline  100 mg Oral Daily  .  thiamine  100 mg Oral Daily   Or  . thiamine  100 mg Intravenous Daily  . traZODone  100 mg Oral QHS   Continuous Infusions: . sodium chloride 100 mL/hr at 11/12/17 0902    Principal Problem:   Jejunal intussusception [facility]) Active Problems:   Abdominal pain, left upper quadrant   Iron deficiency anemia   Essential hypertension   MDD (major depressive disorder), recurrent, severe, with psychosis (HCC)   Chronic left systolic heart failure  (HCC)   Chest pain   Acute kidney injury (HCC)   GERD (gastroesophageal reflux disease)   Alcohol abuse   LOS: 1 day

## 2017-11-12 NOTE — Progress Notes (Signed)
[address] [place], [place] [address] Phone: [phone]  FAX: [place] [phone] 18-May-1966  CARE TEAM:  PCP: Derrill Center, NP  Outpatient Care Team: Patient Care Team: Derrill Center, NP as PCP - General (Nurse Practitioner) Constance Haw, MD as PCP - Cardiology (Cardiology)  Inpatient Treatment Team: Treatment Team: Attending Provider: Samuella Cota, MD; Rounding Team: Sharyon Medicus, MD; Consulting Physician: Edison Pace, Md, MD; Technician: Alberteen Sam, Denisse, NT; Registered Nurse: Lavonna Rua, RN; Technician: Benson Setting, NT   Problem List:   Principal Problem:   Abdominal pain, left upper quadrant Active Problems:   MDD (major depressive disorder), recurrent, severe, with psychosis ([place])   Intussusception of intestine - physiologic & self-resolved   Iron deficiency anemia   Essential hypertension   Chronic left systolic heart failure ([place])   Gastric bypass status for obesity   Chest pain   Acute kidney injury ([place])   GERD (gastroesophageal reflux disease)   Alcohol abuse   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  No evidence of any intra-abdominal pathology to explain abdominal pain  Plan:  -Okay to start diet from surgery standpoint.  BAriatric clears advance to bariatric fulls as tolerated.  Most likely advance/solid diet tomorrow. -Challenge to know what is going on in this patient with chronic anxiety and depression.  Suspect there is a baseline of abdominal pain and dysmotility, especially with bypass surgery making eating not normal -Bowel regimen -VTE prophylaxis- SCDs, etc -mobilize as tolerated to help recovery  20 minutes spent in review, evaluation, examination, counseling, and coordination of care.  More than 50% of that time was  spent in counseling.  Adin Hector, M.D., F.A.C.S. Gastrointestinal and Minimally Invasive Surgery [facility] [address] [place], [place] [address] [phone] Main / Paging   11/12/2017    Subjective: (Chief complaint)  Mild soreness at port site.  No severe abdominal pain. No retching Sitter at bedside  Just seen by psychiatry  Objective:  Vital signs:  Vitals:   11/12/17 0036 11/12/17 0516 11/12/17 0700 11/12/17 0730  BP:  100/71 100/71 102/63  Pulse:  89 89 89  Resp: _0 Temp:  98.2 F (36.8 C)  98.4 F (36.9 C)  TempSrc:  Oral  Oral  SpO2:  100%  100%  Weight:  75.2 kg (165 lb 12.6 oz)    Height:        Last BM Date: 11/10/17  Intake/Output   Yesterday:  12/30 0701 - 12/31 0700 In: 1300 [I.V.:1300] Out: 225 [Urine:200; Blood:25] This shift:  Total I/O In: 180 [P.O.:180] Out: -   Bowel function:  Flatus: YES  BM:  No  Drain: (No drain)   Physical Exam:  General: Pt awake/alert/oriented x4 in no acute distress Eyes: PERRL, normal EOM.  Sclera clear.  No icterus Neuro: CN II-XII intact w/o focal sensory/motor deficits. Lymph: No head/neck/groin lymphadenopathy Psych:  No delerium/psychosis/paranoia.  Anxious HENT: Normocephalic, Mucus membranes moist.  No thrush Neck: Supple, No tracheal deviation Chest: No chest wall pain w good excursion CV:  Pulses intact.  Regular rhythm MS: Normal AROM mjr joints.  No obvious deformity  Abdomen: Soft.  Nondistended.  Mildly tender at incisions only.  No evidence of peritonitis.  No incarcerated hernias.  Ext:  No deformity.  No mjr edema.  No cyanosis Skin: No petechiae / purpura  Results:   Labs: Results for orders placed or performed during the hospital encounter of 11/10/17 (from the past 48 hour(s))  Basic metabolic panel     Status: Abnormal   Collection Time: 11/10/17  7:04 PM  Result Value Ref Range   Sodium 135 135 - 145 mmol/L    Potassium 3.6 3.5 - 5.1 mmol/L   Chloride 91 (L) 101 - 111 mmol/L   CO2 26 22 - 32 mmol/L   Glucose, Bld 128 (H) 65 - 99 mg/dL   BUN 33 (H) 6 - 20 mg/dL   Creatinine, Ser 2.59 (H) 0.44 - 1.00 mg/dL   Calcium 10.0 8.9 - 10.3 mg/dL   GFR calc non Af Amer 20 (L) >60 mL/min   GFR calc Af Amer 24 (L) >60 mL/min    Comment: (NOTE) The eGFR has been calculated using the CKD EPI equation. This calculation has not been validated in all clinical situations. eGFR's persistently <60 mL/min signify possible Chronic Kidney Disease.    Anion gap 18 (H) 5 - 15  CBC     Status: Abnormal   Collection Time: 11/10/17  7:04 PM  Result Value Ref Range   WBC 6.9 4.0 - 10.5 K/uL   RBC 4.67 3.87 - 5.11 MIL/uL   Hemoglobin 12.7 12.0 - 15.0 g/dL   HCT 38.5 36.0 - 46.0 %   MCV 82.4 78.0 - 100.0 fL   MCH 27.2 26.0 - 34.0 pg   MCHC 33.0 30.0 - 36.0 g/dL   RDW 16.3 (H) 11.5 - 15.5 %   Platelets 302 150 - 400 K/uL  Type and screen [place]     Status: None   Collection Time: 11/10/17  7:05 PM  Result Value Ref Range   ABO/RH(D) O POS    Antibody Screen NEG    Sample Expiration 11/13/2017   I-Stat beta hCG blood, ED     Status: None   Collection Time: 11/10/17  7:11 PM  Result Value Ref Range   I-stat hCG, quantitative <5.0 <5 mIU/mL   Comment 3            Comment:   GEST. AGE      CONC.  (mIU/mL)   <=1 WEEK        5 - 50     2 WEEKS       50 - 500     3 WEEKS       100 - 10,000     4 WEEKS     1,000 - 30,000        FEMALE AND NON-PREGNANT FEMALE:     LESS THAN 5 mIU/mL   I-Stat Troponin, ED (not at [facility])     Status: None   Collection Time: 11/10/17  7:11 PM  Result Value Ref Range   Troponin i, poc 0.02 0.00 - 0.08 ng/mL   Comment 3            Comment: Due to the release kinetics of cTnI, a negative result within the first hours of the onset of symptoms does not rule out myocardial infarction with certainty. If myocardial infarction is still suspected, repeat the test at  appropriate intervals.   I-Stat CG4 Lactic Acid, ED     Status: Abnormal   Collection Time: 11/10/17  7:13 PM  Result Value Ref Range   Lactic Acid, Venous 3.88 (HH) 0.5 -  1.9 mmol/L   Comment NOTIFIED PHYSICIAN   D-dimer, quantitative (not at [facility])     Status: Abnormal   Collection Time: 11/10/17  8:44 PM  Result Value Ref Range   D-Dimer, Quant 3.89 (H) 0.00 - 0.50 ug/mL-FEU    Comment: (NOTE) At the manufacturer cut-off of 0.50 ug/mL FEU, this assay has been documented to exclude PE with a sensitivity and negative predictive value of 97 to 99%.  At this time, this assay has not been approved by the FDA to exclude DVT/VTE. Results should be correlated with clinical presentation.   Blood culture (routine x 2)     Status: None (Preliminary result)   Collection Time: 11/10/17  8:44 PM  Result Value Ref Range   Specimen Description BLOOD LEFT HAND    Special Requests      BOTTLES DRAWN AEROBIC AND ANAEROBIC Blood Culture adequate volume   Culture NO GROWTH < 24 HOURS    Report Status PENDING   Blood culture (routine x 2)     Status: None (Preliminary result)   Collection Time: 11/10/17  8:45 PM  Result Value Ref Range   Specimen Description BLOOD BLOOD RIGHT FOREARM    Special Requests      BOTTLES DRAWN AEROBIC AND ANAEROBIC Blood Culture adequate volume   Culture NO GROWTH < 24 HOURS    Report Status PENDING   I-Stat CG4 Lactic Acid, ED     Status: None   Collection Time: 11/10/17  9:11 PM  Result Value Ref Range   Lactic Acid, Venous 1.45 0.5 - 1.9 mmol/L  CBC     Status: Abnormal   Collection Time: 11/11/17  3:20 AM  Result Value Ref Range   WBC 6.8 4.0 - 10.5 K/uL   RBC 4.18 3.87 - 5.11 MIL/uL   Hemoglobin 11.2 (L) 12.0 - 15.0 g/dL   HCT 34.7 (L) 36.0 - 46.0 %   MCV 83.0 78.0 - 100.0 fL   MCH 26.8 26.0 - 34.0 pg   MCHC 32.3 30.0 - 36.0 g/dL   RDW 16.5 (H) 11.5 - 15.5 %   Platelets 220 150 - 400 K/uL  Brain natriuretic peptide     Status: None   Collection Time:  11/11/17  3:20 AM  Result Value Ref Range   B Natriuretic Peptide 18.3 0.0 - 100.0 pg/mL  Urinalysis, Routine w reflex microscopic     Status: Abnormal   Collection Time: 11/11/17  4:51 AM  Result Value Ref Range   Color, Urine YELLOW YELLOW   APPearance CLOUDY (A) CLEAR   Specific Gravity, Urine 1.011 1.005 - 1.030   pH 5.0 5.0 - 8.0   Glucose, UA NEGATIVE NEGATIVE mg/dL   Hgb urine dipstick SMALL (A) NEGATIVE   Bilirubin Urine NEGATIVE NEGATIVE   Ketones, ur 5 (A) NEGATIVE mg/dL   Protein, ur NEGATIVE NEGATIVE mg/dL   Nitrite NEGATIVE NEGATIVE   Leukocytes, UA LARGE (A) NEGATIVE   RBC / HPF 0-5 0 - 5 RBC/hpf   WBC, UA TOO NUMEROUS TO COUNT 0 - 5 WBC/hpf   Bacteria, UA RARE (A) NONE SEEN   Squamous Epithelial / LPF 0-5 (A) NONE SEEN   WBC Clumps PRESENT    Mucus PRESENT    Hyaline Casts, UA PRESENT   Hepatic function panel     Status: Abnormal   Collection Time: 11/11/17  4:51 AM  Result Value Ref Range   Total Protein 6.6 6.5 - 8.1 g/dL   Albumin 3.2 (L) 3.5 - 5.0  g/dL   AST 23 15 - 41 U/L   ALT 23 14 - 54 U/L   Alkaline Phosphatase 73 38 - 126 U/L   Total Bilirubin 0.9 0.3 - 1.2 mg/dL   Bilirubin, Direct 0.1 0.1 - 0.5 mg/dL   Indirect Bilirubin 0.8 0.3 - 0.9 mg/dL  Lipase, blood     Status: Abnormal   Collection Time: 11/11/17  4:51 AM  Result Value Ref Range   Lipase 53 (H) 11 - 51 U/L  Hemoglobin A1c     Status: Abnormal   Collection Time: 11/11/17  4:51 AM  Result Value Ref Range   Hgb A1c MFr Bld 5.8 (H) 4.8 - 5.6 %    Comment: (NOTE) Pre diabetes:          5.7%-6.4% Diabetes:              >6.4% Glycemic control for   <7.0% adults with diabetes    Mean Plasma Glucose 119.76 mg/dL  Lipid panel     Status: Abnormal   Collection Time: 11/11/17  4:51 AM  Result Value Ref Range   Cholesterol 207 (H) 0 - 200 mg/dL   Triglycerides 116 <150 mg/dL   HDL 48 >40 mg/dL   Total CHOL/HDL Ratio 4.3 RATIO   VLDL 23 0 - 40 mg/dL   LDL Cholesterol 136 (H) 0 - 99 mg/dL     Comment:        Total Cholesterol/HDL:CHD Risk Coronary Heart Disease Risk Table                     Men   Women  1/2 Average Risk   3.4   3.3  Average Risk       5.0   4.4  2 X Average Risk   9.6   7.1  3 X Average Risk  23.4   11.0        Use the calculated Patient Ratio above and the CHD Risk Table to determine the patient's CHD Risk.        ATP III CLASSIFICATION (LDL):  <100     mg/dL   Optimal  100-129  mg/dL   Near or Above                    Optimal  130-159  mg/dL   Borderline  160-189  mg/dL   High  >190     mg/dL   Very High   Troponin I (q 6hr x 3)     Status: Abnormal   Collection Time: 11/11/17  4:51 AM  Result Value Ref Range   Troponin I 0.04 (HH) <0.03 ng/mL    Comment: CRITICAL RESULT CALLED TO, READ BACK BY AND VERIFIED WITH: GUIJOZA,A RN 11/11/2017 4098 JORDANS   Creatinine, urine, random     Status: None   Collection Time: 11/11/17  5:26 AM  Result Value Ref Range   Creatinine, Urine 140.30 mg/dL  Urea nitrogen, urine     Status: None   Collection Time: 11/11/17  5:26 AM  Result Value Ref Range   Urea Nitrogen, Ur 461 Not Estab. mg/dL    Comment: (NOTE) Performed At: [facility] [place], [place] [id number] Rush Farmer MD FA:[id number]   Rapid urine drug screen (hospital performed)     Status: None   Collection Time: 11/11/17  5:26 AM  Result Value Ref Range   Opiates NONE DETECTED NONE DETECTED   Cocaine NONE DETECTED NONE DETECTED  Benzodiazepines NONE DETECTED NONE DETECTED   Amphetamines NONE DETECTED NONE DETECTED   Tetrahydrocannabinol NONE DETECTED NONE DETECTED   Barbiturates NONE DETECTED NONE DETECTED    Comment: (NOTE) DRUG SCREEN FOR MEDICAL PURPOSES ONLY.  IF CONFIRMATION IS NEEDED FOR ANY PURPOSE, NOTIFY LAB WITHIN 5 DAYS. LOWEST DETECTABLE LIMITS FOR URINE DRUG SCREEN Drug Class                     Cutoff (ng/mL) Amphetamine and metabolites    1000 Barbiturate and metabolites     200 Benzodiazepine                 102 Tricyclics and metabolites     300 Opiates and metabolites        300 Cocaine and metabolites        300 THC                            50   Troponin I (q 6hr x 3)     Status: Abnormal   Collection Time: 11/11/17 10:43 AM  Result Value Ref Range   Troponin I 0.04 (HH) <0.03 ng/mL    Comment: CRITICAL VALUE NOTED.  VALUE IS CONSISTENT WITH PREVIOUSLY REPORTED AND CALLED VALUE.  Surgical pcr screen     Status: None   Collection Time: 11/11/17  4:10 PM  Result Value Ref Range   MRSA, PCR NEGATIVE NEGATIVE   Staphylococcus aureus NEGATIVE NEGATIVE    Comment: (NOTE) The Xpert SA Assay (FDA approved for NASAL specimens in patients 30 years of age and older), is one component of a comprehensive surveillance program. It is not intended to diagnose infection nor to guide or monitor treatment.   Troponin I (q 6hr x 3)     Status: Abnormal   Collection Time: 11/11/17  8:08 PM  Result Value Ref Range   Troponin I 0.03 (HH) <0.03 ng/mL    Comment: CRITICAL VALUE NOTED.  VALUE IS CONSISTENT WITH PREVIOUSLY REPORTED AND CALLED VALUE.  Glucose, capillary     Status: None   Collection Time: 11/11/17  9:16 PM  Result Value Ref Range   Glucose-Capillary 84 65 - 99 mg/dL  Comprehensive metabolic panel     Status: Abnormal   Collection Time: 11/12/17  6:03 AM  Result Value Ref Range   Sodium 137 135 - 145 mmol/L   Potassium 3.1 (L) 3.5 - 5.1 mmol/L   Chloride 98 (L) 101 - 111 mmol/L   CO2 26 22 - 32 mmol/L   Glucose, Bld 82 65 - 99 mg/dL   BUN 19 6 - 20 mg/dL   Creatinine, Ser 1.81 (H) 0.44 - 1.00 mg/dL   Calcium 8.5 (L) 8.9 - 10.3 mg/dL   Total Protein 5.4 (L) 6.5 - 8.1 g/dL   Albumin 2.6 (L) 3.5 - 5.0 g/dL   AST 19 15 - 41 U/L   ALT 18 14 - 54 U/L   Alkaline Phosphatase 59 38 - 126 U/L   Total Bilirubin 1.0 0.3 - 1.2 mg/dL   GFR calc non Af Amer 31 (L) >60 mL/min   GFR calc Af Amer 36 (L) >60 mL/min    Comment: (NOTE) The eGFR has been  calculated using the CKD EPI equation. This calculation has not been validated in all clinical situations. eGFR's persistently <60 mL/min signify possible Chronic Kidney Disease.    Anion gap 13 5 - 15  CBC  Status: Abnormal   Collection Time: 11/12/17  6:03 AM  Result Value Ref Range   WBC 4.6 4.0 - 10.5 K/uL   RBC 3.43 (L) 3.87 - 5.11 MIL/uL   Hemoglobin 9.1 (L) 12.0 - 15.0 g/dL   HCT 28.6 (L) 36.0 - 46.0 %   MCV 83.4 78.0 - 100.0 fL   MCH 26.5 26.0 - 34.0 pg   MCHC 31.8 30.0 - 36.0 g/dL   RDW 16.6 (H) 11.5 - 15.5 %   Platelets 190 150 - 400 K/uL  Glucose, capillary     Status: None   Collection Time: 11/12/17  7:58 AM  Result Value Ref Range   Glucose-Capillary 76 65 - 99 mg/dL   Comment 1 Document in Chart     Imaging / Studies: Ct Abdomen Pelvis Wo Contrast  Result Date: 11/11/2017 CLINICAL DATA:  Left upper quadrant pain for several days EXAM: CT ABDOMEN AND PELVIS WITHOUT CONTRAST TECHNIQUE: Multidetector CT imaging of the abdomen and pelvis was performed following the standard protocol without IV contrast. COMPARISON:  05/22/2017 FINDINGS: Lower chest: No acute abnormality. Hepatobiliary: The liver is within normal limits. The gallbladder is partially distended with multiple dependent gallstones. Pancreas: Unremarkable. No pancreatic ductal dilatation or surrounding inflammatory changes. Spleen: Normal in size without focal abnormality. Adrenals/Urinary Tract: The adrenal glands are within normal limits. Kidneys are well visualized bilaterally. No renal calculi or obstructive changes are seen. The bladder is partially distended. Stomach/Bowel: The appendix is within normal limits. Some mild gaseous distension of the colon is seen although no true obstructive changes are noted. There are changes noted in the mid to distal jejunum on the left consistent with intussusception. This is best visualized on image numbers 57 through 66 of series 3 as well as image number 35 of series  6. This was not identified on the prior exam and is likely the etiology of the patient's underlying discomfort. No definitive mass lesion is identified although distention of the intussusceptum is incomplete. No definitive obstructive changes are noted secondary to this. Postsurgical changes are noted in the stomach stable from the previous exam. Mild sliding-type hiatal hernia is seen. Vascular/Lymphatic: No significant vascular findings are present. No enlarged abdominal or pelvic lymph nodes. Reproductive: Uterus and bilateral adnexa are unremarkable. Other: No abdominal wall hernia or abnormality. No abdominopelvic ascites. Musculoskeletal: Degenerative changes of lumbar spine are seen. No acute bony abnormality is noted. IMPRESSION: Changes consistent with intussusception within the mid jejunum. This is likely the etiology of the patient's given clinical symptomatology. No obstructive changes are noted. No definitive focal mass is seen although distention of the intussusceptum is somewhat limited. Cholelithiasis without complicating factors. Electronically Signed   By: Inez Catalina M.D.   On: 11/11/2017 08:34   Dg Chest 2 View  Result Date: 11/11/2017 CLINICAL DATA:  Acute onset of generalized chest pain and dry heaves. EXAM: CHEST  2 VIEW COMPARISON:  Chest radiograph performed 01/02/2017 FINDINGS: The lungs are well-aerated and clear. There is no evidence of focal opacification, pleural effusion or pneumothorax. The heart is normal in size; the mediastinal contour is within normal limits. No acute osseous abnormalities are seen. IMPRESSION: No acute cardiopulmonary process seen. Electronically Signed   By: Garald Balding M.D.   On: 11/11/2017 00:44   Nm Pulmonary Vent And Perf (v/q Scan)  Result Date: 11/11/2017 CLINICAL DATA:  51 y/o F; PE suspected, intermediate probability, positive D-dimer. EXAM: NUCLEAR MEDICINE VENTILATION - PERFUSION LUNG SCAN TECHNIQUE: Ventilation images were obtained in  multiple projections using inhaled aerosol Tc-43mDTPA. Perfusion images were obtained in multiple projections after intravenous injection of Tc-997mAA. RADIOPHARMACEUTICALS:  32.5 mCi Technetium-9966mPA aerosol inhalation and 4.32 mCi Technetium-56m19m IV COMPARISON:  None. FINDINGS: Ventilation: No focal ventilation defect. Perfusion: No wedge shaped peripheral perfusion defects to suggest acute pulmonary embolism. IMPRESSION: Normal V/Q scan.  No evidence of pulmonary embolus. Electronically Signed   By: LancKristine Garbe.   On: 11/11/2017 01:55   Dg Abd Portable 1 View  Result Date: 11/11/2017 CLINICAL DATA:  Check nasogastric catheter placement EXAM: PORTABLE ABDOMEN - 1 VIEW COMPARISON:  None. FINDINGS: Scattered large and small bowel gas is noted. A nasogastric catheter is noted within the distal esophagus coiled upon itself. This should be withdrawn and reinserted. IMPRESSION: Nasogastric catheter coiled upon itself within the distal esophagus. Electronically Signed   By: MarkInez Catalina.   On: 11/11/2017 12:16    Medications / Allergies: per chart  Antibiotics: Anti-infectives (From admission, onward)   Start     Dose/Rate Route Frequency Ordered Stop   11/12/17 0600  cefoTEtan (CEFOTAN) 1 g in dextrose 5 % 50 mL IVPB  Status:  Discontinued     1 g 100 mL/hr over 30 Minutes Intravenous To Short Stay 11/11/17 1523 11/11/17 2024   11/11/17 1629  cefoTEtan in Dextrose 5% (CEFOTAN) 2-2.08 GM-%(50ML) IVPB  Status:  Discontinued    Comments:  GibbLaurita Quint cabinet override      11/11/17 1629 11/11/17 1632   11/11/17 0800  cefTRIAXone (ROCEPHIN) 1 g in dextrose 5 % 50 mL IVPB  Status:  Discontinued     1 g 100 mL/hr over 30 Minutes Intravenous Every 24 hours 11/11/17 0715 11/11/17 1028        Note: Portions of this report may have been transcribed using voice recognition software. Every effort was made to ensure accuracy; however, inadvertent computerized  transcription errors may be present.   Any transcriptional errors that result from this process are unintentional.     StevAdin HectorD., F.A.C.S. Gastrointestinal and Minimally Invasive Surgery [facility] [email or web address] [phone] / Paging   11/12/2017

## 2017-11-12 NOTE — Plan of Care (Signed)
  Education: Knowledge of General Education information will improve 11/12/2017 0735 - Progressing by Chaya Jan, RN

## 2017-11-12 NOTE — Progress Notes (Signed)
Bilateral lower extremity venous duplex completed. No evidence of DVT, superficial thrombosis, or Baker's cyst. Toma Deiters, RVS 11/12/2017 1:27 PM

## 2017-11-13 DIAGNOSIS — K599 Functional intestinal disorder, unspecified: Secondary | ICD-10-CM

## 2017-11-13 LAB — BASIC METABOLIC PANEL
Anion gap: 9 (ref 5–15)
BUN: 13 mg/dL (ref 6–20)
CHLORIDE: 101 mmol/L (ref 101–111)
CO2: 28 mmol/L (ref 22–32)
CREATININE: 1.35 mg/dL — AB (ref 0.44–1.00)
Calcium: 8.6 mg/dL — ABNORMAL LOW (ref 8.9–10.3)
GFR calc Af Amer: 52 mL/min — ABNORMAL LOW (ref >60)
GFR calc non Af Amer: 45 mL/min — ABNORMAL LOW (ref >60)
GLUCOSE: 83 mg/dL (ref 65–99)
POTASSIUM: 3.1 mmol/L — AB (ref 3.5–5.1)
SODIUM: 138 mmol/L (ref 135–145)

## 2017-11-13 LAB — GLUCOSE, CAPILLARY: GLUCOSE-CAPILLARY: 76 mg/dL (ref 65–99)

## 2017-11-13 MED ORDER — ONDANSETRON HCL 4 MG/2ML IJ SOLN
4.0000 mg | Freq: Four times a day (QID) | INTRAMUSCULAR | Status: DC | PRN
  Administered 2017-11-13 – 2017-11-15 (×4): 4 mg via INTRAVENOUS
  Filled 2017-11-13 (×4): qty 2

## 2017-11-13 NOTE — Progress Notes (Signed)
PROGRESS NOTE  Julia Wheeler ZOX:[id number] DOB: 1965-12-17 DOA: 11/10/2017 PCP: Oneta Rack, NP  Brief Narrative: 51yow PMH LVEF 45% presented with central abdominal pain 2-3 days with nausea, dry heaves; decreased PO intake; episode of CP in ED (per patient). EDP documentation notable for pt c/o several days of CP, SOB. Admitted for CP eval, abd pain. Subsequent CT revealed jejunal intussusception. Surgery consulted.  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 S/p gastric bypass 2005; s/p diag lap 2008 for SBO, incarcerated internal hernias x2; s/p diag lap 2015 for possible jejunal intussusception.   - s/p diagnostic laparoscopy as above - surgery has advanced diet as tolerated, still has some pain will observe pain as diet advances, if tolerates, likely home 1/2.  AKI - secondary to poor oral intake; essentially resolved with IVF>  Chest pain, elevated troponin - EKG nonacute; pain resolved; telemetry SR, troponins flat; ACS ruled out; symptoms secondary to above; no further evaluation suggested  Borderline prolonged QTc - EKG 12/31 shows borderline prolonged QTc, improved; no further evaluation suggested at this point  Suicidal ideation, depression - appreciate psychiatry eval; patient cleared from psychiatry standpoint; follow-up as an outpatient  Chronic systolic CHF, LVEF 45-50%, diffuse hypokinesis on echo 01/2016. - appears compensated 1/1; follow I/O  Sickle cell trait  Asymptomatic bacturia - completely asymptomatic, no tx indicated  Possible alcohol abuse - CIWA; no evidence of withdrawal 1/1  DVT prophylaxis: SCDs Code Status: full Family Communication: none Disposition Plan: home pending tolerance of diet, decreased abd pain   Brendia Sacks, MD  [facility] Direct contact: [phone] --Via amion app OR  --[email or web address]; password [email or web address]  7PM-7AM  contact night coverage as above 11/13/2017, 4:12 PM  LOS: 2 days   Consultants:  General surgery  Procedures: 12/30 diagnostic laparoscopy  Antimicrobials:    Interval history/Subjective: Overall better but still has 5/10 abd pain. Tolerating liquids. Doesn't feel good. No CP.  Objective: Vitals:  Vitals:   11/13/17 0333 11/13/17 1147  BP: 98/60 105/78  Pulse: 82 87  Resp: 18 19  Temp: 97.6 F (36.4 C) 98 F (36.7 C)  SpO2: 99% 100%    Exam:  Constitutional:   . Appears calm and comfortable Respiratory:  . CTA bilaterally, no w/r/r.  . Respiratory effort normal.  Cardiovascular:  . RRR, no m/r/g . No LE extremity edema   Abdomen:  . soft Psychiatric:  . Mental status o Mood depressed, affect flat  I have personally reviewed the following:   Labs:  Creatinine 2.59 >> 1.81 >> 1.35  K+ 3.1   Scheduled Meds: . atorvastatin  10 mg Oral QHS  . carvedilol  6.25 mg Oral BID WC  . clonazePAM  0.5 mg Oral BID  . cyclobenzaprine  10 mg Oral BID  . DULoxetine  90 mg Oral Daily  . folic acid  1 mg Oral Daily  . LORazepam  0-4 mg Intravenous Q12H  . multivitamins with iron  1 tablet Oral Daily  . pantoprazole  40 mg Oral BID AC  . sertraline  100 mg Oral Daily  . sodium chloride flush  3 mL Intravenous Q12H  . thiamine  100 mg Oral Daily   Or  . thiamine  100 mg Intravenous Daily  . traZODone  100 mg Oral QHS   Continuous Infusions: . sodium chloride    . lactated ringers      Principal Problem:   Intussusception  of intestine - physiologic & self-resolved Active Problems:   Abdominal pain, left upper quadrant   Iron deficiency anemia   Essential hypertension   MDD (major depressive disorder), recurrent, severe, with psychosis (HCC)   Chronic left systolic heart failure (HCC)   Gastric bypass status for obesity   GERD (gastroesophageal reflux disease)   Alcohol abuse   Hyperactive small intestine s/p Dx laparoscopy 11/11/2017   MDD (major  depressive disorder), recurrent severe, without psychosis (HCC)   LOS: 2 days

## 2017-11-13 NOTE — Evaluation (Addendum)
Physical Therapy Evaluation Patient Details Name: Julia Wheeler MRN: [id number] DOB: [birth year] Today's Date: 11/13/2017   History of Present Illness  Julia Wheeler is a 52 y.o. female patient admitted with nausea and and abdominal pain.patient has a history of gastric bypass in 2005 and has had multiple GI problems since since 2008 including small bowel obstruction in 2008 and incarcerated hernias. She reported 2-3 days of abdominal pain, nausea, dry heaving and weakness prior to hospitalization. A diagnostic laparoscopy was completed Wheeler 12/30 without evidence of intussusception seen Wheeler prior imaging. During hospitalization, patient endorsed SI so psychiatry was consulted. Of note, she was admitted to the hospital in 08/2015 for depression with psychosis and SI. She reports fleeting suicidal thoughts for the past year due to multiple psychosocial stressors. She reports recent carpal tunnel surgery in her dominant hand.   Clinical Impression  Pt admitted with above diagnosis. Pt currently with functional limitations due to the deficits listed below (see PT Problem List). Pt was able to ambulate without device with fairly good stability.  Pt ambulates tenously however therefore recommend a RW for home for safety.  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 At end of session, pt reports that she can't lift her left UE very well.  Pt only able to raise left UE to shoulder level.  Smile symmetrical and no other symptoms.  Will follow acutely.   Pt will benefit from skilled PT to increase their independence and safety with mobility to allow discharge to the venue listed below.    Follow Up Recommendations Outpatient PT for vestibular rehab;Supervision - Intermittent   Equipment Recommendations  Rolling walker with 5" wheels    Recommendations for Other Services       Precautions / Restrictions Precautions Precautions: Fall Restrictions Weight Bearing Restrictions:  No      Mobility  Bed Mobility Overal bed mobility: Independent                Transfers Overall transfer level: Independent                  Ambulation/Gait Ambulation/Gait assistance: Min assist Ambulation Distance (Feet): 200 Feet Assistive device: None Gait Pattern/deviations: Step-through pattern;Decreased stride length;Drifts right/left   Gait velocity interpretation: Below normal speed for age/gender General Gait Details: Pt was able to ambulate without device with good stability overall.  AT times with challenges, pt drifts left and right due to inner ear issue but did not lose balance significantly.   Stairs            Wheelchair Mobility    Modified Rankin (Stroke Patients Only)       Balance Overall balance assessment: Needs assistance Sitting-balance support: No upper extremity supported;Feet supported Sitting balance-Leahy Scale: Good     Standing balance support: During functional activity;No upper extremity supported Standing balance-Leahy Scale: Fair Standing balance comment: Pt able to withstand min challenges to balance and maintain balance without a device.                              Pertinent Vitals/Pain Pain Assessment: No/denies pain    Home Living Family/patient expects to be discharged to:: Private residence Living Arrangements: Children Available Help at Discharge: Family;Available PRN/intermittently(living with son only until March) Type of Home: Apartment Home Access: Stairs to enter Entrance Stairs-Rails: Right Entrance Stairs-Number of Steps: 6 Home Layout: One level Home Equipment: None  Prior Function Level of Independence: Independent         Comments: daughter assists getting patient getting into and out of bath tub. Otherwise completely indpendent.      Hand Dominance   Dominant Hand: Right    Extremity/Trunk Assessment   Upper Extremity Assessment Upper Extremity Assessment:  Defer to OT evaluation    Lower Extremity Assessment Lower Extremity Assessment: Overall WFL for tasks assessed    Cervical / Trunk Assessment Cervical / Trunk Assessment: Normal  Communication   Communication: No difficulties  Cognition Arousal/Alertness: Awake/alert Behavior During Therapy: WFL for tasks assessed/performed Overall Cognitive Status: Within Functional Limits for tasks assessed                                        General Comments General comments (skin integrity, edema, etc.): Pt negative for BPPV all canals.  Pt tested positive for left hypofunction of inner ear.      Exercises Other Exercises Other Exercises: initiated x1 exercises.  Pt instructed to perform 2 x every other hour.   Assessment/Plan    PT Assessment Patient needs continued PT services  PT Problem List Decreased strength;Decreased balance;Decreased mobility;Decreased activity tolerance;Decreased knowledge of use of DME;Decreased safety awareness;Decreased knowledge of precautions(left vestibular hypofunction)       PT Treatment Interventions DME instruction;Gait training;Functional mobility training;Therapeutic activities;Therapeutic exercise;Balance training;Stair training;Patient/family education(gaze stability exercises)    PT Goals (Current goals can be found in the Care Plan section)  Acute Rehab PT Goals Patient Stated Goal: to get better PT Goal Formulation: With patient Time For Goal Achievement: 11/27/17 Potential to Achieve Goals: Good    Frequency Min 3X/week   Barriers to discharge Decreased caregiver support      Co-evaluation               AM-PAC PT "6 Clicks" Daily Activity  Outcome Measure Difficulty turning over in bed (including adjusting bedclothes, sheets and blankets)?: None Difficulty moving from lying Wheeler back to sitting Wheeler the side of the bed? : None Difficulty sitting down Wheeler and standing up from a chair with arms (e.g., wheelchair,  bedside commode, etc,.)?: None Help needed moving to and from a bed to chair (including a wheelchair)?: A Little Help needed walking in hospital room?: A Little Help needed climbing 3-5 steps with a railing? : A Little 6 Click Score: 21    End of Session Equipment Utilized During Treatment: Gait belt Activity Tolerance: Patient tolerated treatment well Patient left: in bed;with call bell/phone within reach;with bed alarm set Nurse Communication: Mobility status PT Visit Diagnosis: Unsteadiness Wheeler feet (R26.81);History of falling (Z91.81);Dizziness and giddiness (R42)    Time: 1610-9604 PT Time Calculation (min) (ACUTE ONLY): 31 min   Charges:   PT Evaluation $PT Eval Moderate Complexity: 1 Mod PT Treatments $Gait Training: 8-22 mins   PT G Codes:        Maksym Pfiffner,PT Acute Rehabilitation [phone] (pager)   Berline Lopes 11/13/2017, 1:00 PM

## 2017-11-13 NOTE — Progress Notes (Signed)
2 Days Post-Op   Subjective/Chief Complaint: Pt doing well abd pain better   Objective: Vital signs in last 24 hours: Temp:  [97.6 F (36.4 C)-98.5 F (36.9 C)] 97.6 F (36.4 C) (01/01 0333) Pulse Rate:  [79-84] 82 (01/01 0333) Resp:  [12-18] 18 (01/01 0333) BP: (98-107)/(60-72) 98/60 (01/01 0333) SpO2:  [99 %-100 %] 99 % (01/01 0333) Weight:  [76.5 kg (168 lb 9.6 oz)] 76.5 kg (168 lb 9.6 oz) (01/01 0450) Last BM Date: 11/11/17  Intake/Output from previous day: 12/31 0701 - 01/01 0700 In: 660 [P.O.:660] Out: -  Intake/Output this shift: Total I/O In: 240 [P.O.:240] Out: -   General appearance: alert and cooperative GI: soft, non-tender; bowel sounds normal; no masses,  no organomegaly and inc c/d/i  Lab Results:  Recent Labs    11/11/17 0320 11/12/17 0603  WBC 6.8 4.6  HGB 11.2* 9.1*  HCT 34.7* 28.6*  PLT 220 190   BMET Recent Labs    11/12/17 0603 11/13/17 0433  NA 137 138  K 3.1* 3.1*  CL 98* 101  CO2 26 28  GLUCOSE 82 83  BUN 19 13  CREATININE 1.81* 1.35*  CALCIUM 8.5* 8.6*   PT/INR No results for input(s): LABPROT, INR in the last 72 hours. ABG No results for input(s): PHART, HCO3 in the last 72 hours.  Invalid input(s): PCO2, PO2  Studies/Results: Dg Abd Portable 1 View  Result Date: 11/11/2017 CLINICAL DATA:  Check nasogastric catheter placement EXAM: PORTABLE ABDOMEN - 1 VIEW COMPARISON:  None. FINDINGS: Scattered large and small bowel gas is noted. A nasogastric catheter is noted within the distal esophagus coiled upon itself. This should be withdrawn and reinserted. IMPRESSION: Nasogastric catheter coiled upon itself within the distal esophagus. Electronically Signed   By: Alcide Clever M.D.   On: 11/11/2017 12:16    Anti-infectives: Anti-infectives (From admission, onward)   Start     Dose/Rate Route Frequency Ordered Stop   11/12/17 0600  cefoTEtan (CEFOTAN) 1 g in dextrose 5 % 50 mL IVPB  Status:  Discontinued     1 g 100 mL/hr over  30 Minutes Intravenous To Short Stay 11/11/17 1523 11/11/17 2024   11/11/17 1629  cefoTEtan in Dextrose 5% (CEFOTAN) 2-2.08 GM-%(50ML) IVPB  Status:  Discontinued    Comments:  Lorenda Ishihara   : cabinet override      11/11/17 1629 11/11/17 1632   11/11/17 0800  cefTRIAXone (ROCEPHIN) 1 g in dextrose 5 % 50 mL IVPB  Status:  Discontinued     1 g 100 mL/hr over 30 Minutes Intravenous Every 24 hours 11/11/17 0715 11/11/17 1028      Assessment/Plan: s/p Procedure(s): LAPAROSCOPY DIAGNOSTIC (N/A) Advance diet as tol Mobilize   LOS: 2 days    Marigene Ehlers., Jed Limerick 11/13/2017

## 2017-11-13 NOTE — Plan of Care (Signed)
Pt. Noted to be unsteady while assisting to restroom. Pt. Informed of fall prevention safety plan and fall risk status. Pt. Call light placed in reach and told to call for assistance with toileting.

## 2017-11-13 NOTE — Progress Notes (Signed)
Pt. Requesting med for nausea. On call for [facility] paged to make aware. Pt. Vomited x1.

## 2017-11-13 NOTE — Plan of Care (Signed)
Pt. Remains unsteady when ambulating requiring assistance. Surgical sites remain dry, clean, and intact. Pt. Continues to have flat affect when communicating with her.

## 2017-11-14 MED ORDER — ACETAMINOPHEN 325 MG PO TABS: 650.0000 mg | ORAL_TABLET | Freq: Four times a day (QID) | ORAL | Status: DC | PRN

## 2017-11-14 NOTE — Progress Notes (Addendum)
3 Days Post-Op   Subjective/Chief Complaint: Ate yesterday but did throw up some. Ate some breakfast OK this AM.   Objective: Vital signs in last 24 hours: Temp:  [97.7 F (36.5 C)-98.5 F (36.9 C)] 97.7 F (36.5 C) (01/02 0408) Pulse Rate:  [74-87] 74 (01/02 0408) Resp:  [18-19] 18 (01/02 0408) BP: (98-110)/(67-78) 98/67 (01/02 0408) SpO2:  [98 %-100 %] 98 % (01/02 0408) Weight:  [75.3 kg (166 lb 1.6 oz)] 75.3 kg (166 lb 1.6 oz) (01/02 0408) Last BM Date: 11/10/17  Intake/Output from previous day: 01/01 0701 - 01/02 0700 In: 960 [P.O.:960] Out: 1050 [Urine:1050] Intake/Output this shift: No intake/output data recorded.  General appearance: alert and cooperative GI: incisions OK, soft, active BS  Lab Results:  Recent Labs    11/12/17 0603  WBC 4.6  HGB 9.1*  HCT 28.6*  PLT 190   BMET Recent Labs    11/12/17 0603 11/13/17 0433  NA 137 138  K 3.1* 3.1*  CL 98* 101  CO2 26 28  GLUCOSE 82 83  BUN 19 13  CREATININE 1.81* 1.35*  CALCIUM 8.5* 8.6*   PT/INR No results for input(s): LABPROT, INR in the last 72 hours. ABG No results for input(s): PHART, HCO3 in the last 72 hours.  Invalid input(s): PCO2, PO2  Studies/Results: No results found.  Anti-infectives: Anti-infectives (From admission, onward)   Start     Dose/Rate Route Frequency Ordered Stop   11/12/17 0600  cefoTEtan (CEFOTAN) 1 g in dextrose 5 % 50 mL IVPB  Status:  Discontinued     1 g 100 mL/hr over 30 Minutes Intravenous To Short Stay 11/11/17 1523 11/11/17 2024   11/11/17 1629  cefoTEtan in Dextrose 5% (CEFOTAN) 2-2.08 GM-%(50ML) IVPB  Status:  Discontinued    Comments:  Lorenda Ishihara   : cabinet override      11/11/17 1629 11/11/17 1632   11/11/17 0800  cefTRIAXone (ROCEPHIN) 1 g in dextrose 5 % 50 mL IVPB  Status:  Discontinued     1 g 100 mL/hr over 30 Minutes Intravenous Every 24 hours 11/11/17 0715 11/11/17 1028      Assessment/Plan: S/P diagnostic laparoscopy 12/30 Dr.  Sheliah Hatch Continue diet as tolerated  LOS: 3 days    Liz Malady 11/14/2017

## 2017-11-14 NOTE — Plan of Care (Signed)
Pts. Affect remains flat, however pt. More interactive with staff.

## 2017-11-14 NOTE — Progress Notes (Signed)
Physical Therapy Treatment Patient Details Name: Julia Wheeler MRN: [id number] DOB: 1965-11-24 Today's Date: 11/14/2017    History of Present Illness Julia Wheeler is a 52 y.o. female patient admitted with nausea and and abdominal pain.patient has a history of gastric bypass in 2005 and has had multiple GI problems since since 2008 including small bowel obstruction in 2008 and incarcerated hernias. She reported 2-3 days of abdominal pain, nausea, dry heaving and weakness prior to hospitalization. A diagnostic laparoscopy was completed on 12/30 without evidence of intussusception seen on prior imaging. During hospitalization, patient endorsed SI so psychiatry was consulted. Of note, she was admitted to the hospital in 08/2015 for depression with psychosis and SI. She reports fleeting suicidal thoughts for the past year due to multiple psychosocial stressors. She reports recent carpal tunnel surgery in her dominant hand.     PT Comments    Pt admitted with above diagnosis. Pt currently with functional limitations due to balance and endurance deficits. Pt able to ambulate with incr stability today.  Pt still at times wants to hold to furniture if able as she feels unsteady at times due to left inner ear issue.  Will continue to progress as pt tolerates.  Pt will benefit from skilled PT to increase their independence and safety with mobility to allow discharge to the venue listed below.     Follow Up Recommendations  Outpatient PT;Supervision - Intermittent     Equipment Recommendations  Rolling walker with 5" wheels    Recommendations for Other Services       Precautions / Restrictions Precautions Precautions: Fall Restrictions Weight Bearing Restrictions: No    Mobility  Bed Mobility Overal bed mobility: Independent                Transfers Overall transfer level: Independent                  Ambulation/Gait Ambulation/Gait assistance: Min guard Ambulation Distance  (Feet): 350 Feet Assistive device: None Gait Pattern/deviations: Step-through pattern;Decreased stride length;Drifts right/left   Gait velocity interpretation: Below normal speed for age/gender General Gait Details: Pt was able to ambulate without device with good stability overall.  AT times with challenges, pt drifts left and right due to inner ear issue but did not lose balance significantly.    Stairs            Wheelchair Mobility    Modified Rankin (Stroke Patients Only)       Balance Overall balance assessment: Needs assistance Sitting-balance support: No upper extremity supported;Feet supported Sitting balance-Leahy Scale: Good     Standing balance support: During functional activity;No upper extremity supported Standing balance-Leahy Scale: Fair Standing balance comment: Pt able to withstand min challenges to balance and maintain balance without a device.                             Cognition Arousal/Alertness: Awake/alert Behavior During Therapy: WFL for tasks assessed/performed Overall Cognitive Status: Within Functional Limits for tasks assessed                                        Exercises Other Exercises Other Exercises: Reviewed x1 exercises.  Pt instructed to continue to perform 2 x every other hour.    General Comments        Pertinent Vitals/Pain Pain Assessment: No/denies pain  Home Living                      Prior Function            PT Goals (current goals can now be found in the care plan section) Acute Rehab PT Goals Patient Stated Goal: to get better Progress towards PT goals: Progressing toward goals    Frequency    Min 3X/week      PT Plan Current plan remains appropriate    Co-evaluation              AM-PAC PT "6 Clicks" Daily Activity  Outcome Measure  Difficulty turning over in bed (including adjusting bedclothes, sheets and blankets)?: None Difficulty moving from  lying on back to sitting on the side of the bed? : None Difficulty sitting down on and standing up from a chair with arms (e.g., wheelchair, bedside commode, etc,.)?: None Help needed moving to and from a bed to chair (including a wheelchair)?: A Little Help needed walking in hospital room?: A Little Help needed climbing 3-5 steps with a railing? : A Little 6 Click Score: 21    End of Session Equipment Utilized During Treatment: Gait belt Activity Tolerance: Patient tolerated treatment well Patient left: in bed;with call bell/phone within reach;with bed alarm set Nurse Communication: Mobility status PT Visit Diagnosis: Unsteadiness on feet (R26.81);History of falling (Z91.81);Dizziness and giddiness (R42)     Time: 1257-1310 PT Time Calculation (min) (ACUTE ONLY): 13 min  Charges:  $Gait Training: 8-22 mins                    G Codes:       Miryah Ralls,PT Acute Rehabilitation [phone] (pager)    Berline Lopes 11/14/2017, 1:42 PM

## 2017-11-14 NOTE — Progress Notes (Signed)
Patient ID: Julia Wheeler, female   DOB: 08-18-1966, 52 y.o.   MRN: [id number]  PROGRESS NOTE    Julia Wheeler  WUJ:[id number] DOB: 1966-09-26 DOA: 11/10/2017 PCP: Oneta Rack, NP   Brief Narrative:  A 52 year old female with history of hypertension, hyperlipidemia, GERD, depression, anxiety, stomach ulcer, iron deficiency anemia, CHF with EF of 45%, chronic back pain, alcohol abuse, GI bleeding, gastric bypass surgery presented with chest pain, shortness of breath and abdominal pain.  She was admitted for chest pain evaluation with abdominal pain.  Subsequent CT revealed jejunal intussusception.  Surgery was consulted.  She underwent diagnostic laparoscopy on 11/11/2017.   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  Surgery has advanced diet as tolerated.  Patient had vomited last night and is still nauseous this morning.  If patient tolerates diet and feels better, probable discharge tomorrow if cleared by general surgery  AKI - secondary to poor oral intake -Resolved -Repeat a.m. labs  Chest pain, elevated troponin - EKG nonacute; pain resolved; telemetry SR, troponins flat; ACS ruled out; symptoms secondary to above; no further evaluation suggested  Borderline prolonged QTc - EKG 10/15/17  shows borderline  prolonged QTc, improved; no further evaluation suggested at this point  Suicidal ideation, depression - appreciate psychiatry eval; patient cleared from psychiatry standpoint; follow-up as an outpatient  Chronic systolic CHF, LVEF 45-50%, diffuse hypokinesis on echo 01/2016. - appears compensated; follow I/O  Sickle cell trait  Asymptomatic bacturia - completely asymptomatic, no treatment indicated   Possible alcohol abuse - on CIWA protocol; no evidence of withdrawal    DVT prophylaxis: SCDs Code Status: Full Family Communication: None at bedside Disposition Plan: Home in 1-2 days  Consultants: General surgery  Procedures: Diagnostic laparoscopy on 11/11/2017  Antimicrobials:  None  Subjective: Patient seen and examined at bedside.  She complains of nausea and vomited last night.  No overnight fever or chest pain.  Objective: Vitals:   11/13/17 0450 11/13/17 1147 11/13/17 2033 11/14/17 0408  BP:  105/78 110/78 98/67  Pulse:  87 84 74  Resp:  19 18 18   Temp:  98 F (36.7 C) 98.5 F (36.9 C) 97.7 F (36.5 C)  TempSrc:  Oral Oral Oral  SpO2:  100% 98% 98%  Weight: 76.5 kg (168 lb 9.6 oz)   75.3 kg (166 lb 1.6 oz)  Height:        Intake/Output Summary (Last 24 hours) at 11/14/2017 1204 Last data filed at 11/14/2017 0901 Gross per 24 hour  Intake 720 ml  Output 1700 ml  Net -980 ml   Filed Weights   11/12/17 0516 11/13/17 0450 11/14/17 0408  Weight: 75.2 kg (165 lb 12.6 oz) 76.5 kg (168 lb 9.6 oz) 75.3 kg (166 lb 1.6 oz)    Examination:  General exam: Appears calm and comfortable  Respiratory system: Bilateral decreased breath sound at bases Cardiovascular system:  S1 & S2 heard, rate controlled  gastrointestinal system: Abdomen is nondistended, soft and nontender. Normal bowel sounds heard. Extremities: No cyanosis, clubbing, edema     Data Reviewed: I have personally reviewed following labs and imaging studies  CBC: Recent Labs  Lab  11/10/17 1904 11/11/17 0320 11/12/17 0603  WBC 6.9 6.8 4.6  HGB 12.7 11.2* 9.1*  HCT 38.5 34.7* 28.6*  MCV 82.4 83.0 83.4  PLT 302 220 190   Basic Metabolic Panel: Recent Labs  Lab 11/10/17 1904 11/12/17 0603 11/13/17 0433  NA 135 137 138  K 3.6 3.1* 3.1*  CL 91* 98* 101  CO2 26 26 28   GLUCOSE 128* 82 83  BUN 33* 19 13  CREATININE 2.59* 1.81* 1.35*  CALCIUM 10.0 8.5* 8.6*   GFR: Estimated Creatinine Clearance: 52.2 mL/min (A) (by C-G formula based on SCr of 1.35 mg/dL (H)). Liver Function Tests: Recent Labs  Lab 11/11/17 0451 11/12/17 0603  AST 23 19  ALT 23 18  ALKPHOS 73 59  BILITOT 0.9 1.0  PROT 6.6 5.4*  ALBUMIN 3.2* 2.6*   Recent Labs  Lab 11/11/17 0451  LIPASE 53*   No results for input(s): AMMONIA in the last 168 hours. Coagulation Profile: No results for input(s): INR, PROTIME in the last 168 hours. Cardiac Enzymes: Recent Labs  Lab 11/11/17 0451 11/11/17 1043 11/11/17 2008  TROPONINI 0.04* 0.04* 0.03*   BNP (last 3 results) No results for input(s): PROBNP in the last 8760 hours. HbA1C: No results for input(s): HGBA1C in the last 72 hours. CBG: Recent Labs  Lab 11/11/17 2116 11/12/17 0758 11/12/17 1134 11/13/17 1143  GLUCAP 84 76 71 76   Lipid Profile: No results for input(s): CHOL, HDL, LDLCALC, TRIG, CHOLHDL, LDLDIRECT in the last 72 hours. Thyroid Function Tests: No results for input(s): TSH, T4TOTAL, FREET4, T3FREE, THYROIDAB in the last 72 hours. Anemia Panel: No results for input(s): VITAMINB12, FOLATE, FERRITIN, TIBC, IRON, RETICCTPCT in the last 72 hours. Sepsis Labs: Recent Labs  Lab 11/10/17 1913 11/10/17 2111  LATICACIDVEN 3.88* 1.45    Recent Results (from the past 240 hour(s))  Blood culture (routine x 2)     Status: None (Preliminary result)   Collection Time: 11/10/17  8:44 PM  Result Value Ref Range Status   Specimen Description BLOOD LEFT HAND  Final   Special Requests   Final    BOTTLES DRAWN AEROBIC AND  ANAEROBIC Blood Culture adequate volume   Culture NO GROWTH 3 DAYS  Final   Report Status PENDING  Incomplete  Blood culture (routine x 2)     Status: None (Preliminary result)   Collection Time: 11/10/17  8:45 PM  Result Value Ref Range Status   Specimen Description BLOOD BLOOD RIGHT FOREARM  Final   Special Requests   Final    BOTTLES DRAWN AEROBIC AND ANAEROBIC Blood Culture adequate volume   Culture NO GROWTH 3 DAYS  Final   Report Status PENDING  Incomplete  Surgical pcr screen     Status: None   Collection Time: 11/11/17  4:10 PM  Result Value Ref Range Status   MRSA, PCR NEGATIVE NEGATIVE Final   Staphylococcus aureus NEGATIVE NEGATIVE Final    Comment: (NOTE) The Xpert SA Assay (FDA approved for NASAL specimens in patients 76 years of age and older), is one component of a comprehensive surveillance program. It is not intended to diagnose infection nor to guide or monitor treatment.          Radiology Studies:  No results found.      Scheduled Meds: . atorvastatin  10 mg Oral QHS  . carvedilol  6.25 mg Oral BID WC  . clonazePAM  0.5 mg Oral BID  . cyclobenzaprine  10 mg Oral BID  . DULoxetine  90 mg Oral Daily  . folic acid  1 mg Oral Daily  . LORazepam  0-4 mg Intravenous Q12H  . multivitamins with iron  1 tablet Oral Daily  . pantoprazole  40 mg Oral BID AC  . sertraline  100 mg Oral Daily  . sodium chloride flush  3 mL Intravenous Q12H  . thiamine  100 mg Oral Daily   Or  . thiamine  100 mg Intravenous Daily  . traZODone  100 mg Oral QHS   Continuous Infusions: . sodium chloride       LOS: 3 days        Glade Lloyd, MD [facility] Pager [phone]  If 7PM-7AM, please contact night-coverage [email or web address] Password [facility] 11/14/2017, 12:04 PM

## 2017-11-15 DIAGNOSIS — K561 Intussusception: Secondary | ICD-10-CM

## 2017-11-15 LAB — CBC WITH DIFFERENTIAL/PLATELET
BASOS ABS: 0 10*3/uL (ref 0.0–0.1)
BASOS PCT: 0 %
EOS PCT: 2 %
Eosinophils Absolute: 0.1 10*3/uL (ref 0.0–0.7)
HEMATOCRIT: 28.8 % — AB (ref 36.0–46.0)
Hemoglobin: 9.2 g/dL — ABNORMAL LOW (ref 12.0–15.0)
Lymphocytes Relative: 47 %
Lymphs Abs: 1.4 10*3/uL (ref 0.7–4.0)
MCH: 26.7 pg (ref 26.0–34.0)
MCHC: 31.9 g/dL (ref 30.0–36.0)
MCV: 83.5 fL (ref 78.0–100.0)
MONO ABS: 0.2 10*3/uL (ref 0.1–1.0)
Monocytes Relative: 7 %
NEUTROS ABS: 1.4 10*3/uL — AB (ref 1.7–7.7)
Neutrophils Relative %: 44 %
PLATELETS: 201 10*3/uL (ref 150–400)
RBC: 3.45 MIL/uL — ABNORMAL LOW (ref 3.87–5.11)
RDW: 16.4 % — AB (ref 11.5–15.5)
WBC: 3.1 10*3/uL — ABNORMAL LOW (ref 4.0–10.5)

## 2017-11-15 LAB — COMPREHENSIVE METABOLIC PANEL
ALBUMIN: 2.7 g/dL — AB (ref 3.5–5.0)
ALT: 14 U/L (ref 14–54)
ANION GAP: 7 (ref 5–15)
AST: 18 U/L (ref 15–41)
Alkaline Phosphatase: 55 U/L (ref 38–126)
BILIRUBIN TOTAL: 0.9 mg/dL (ref 0.3–1.2)
BUN: 7 mg/dL (ref 6–20)
CO2: 31 mmol/L (ref 22–32)
Calcium: 8.6 mg/dL — ABNORMAL LOW (ref 8.9–10.3)
Chloride: 100 mmol/L — ABNORMAL LOW (ref 101–111)
Creatinine, Ser: 1.41 mg/dL — ABNORMAL HIGH (ref 0.44–1.00)
GFR calc Af Amer: 49 mL/min — ABNORMAL LOW (ref >60)
GFR, EST NON AFRICAN AMERICAN: 42 mL/min — AB (ref >60)
GLUCOSE: 82 mg/dL (ref 65–99)
POTASSIUM: 2.8 mmol/L — AB (ref 3.5–5.1)
Sodium: 138 mmol/L (ref 135–145)
TOTAL PROTEIN: 5.4 g/dL — AB (ref 6.5–8.1)

## 2017-11-15 LAB — CULTURE, BLOOD (ROUTINE X 2)
CULTURE: NO GROWTH
Culture: NO GROWTH
Special Requests: ADEQUATE
Special Requests: ADEQUATE

## 2017-11-15 LAB — MAGNESIUM: MAGNESIUM: 1.6 mg/dL — AB (ref 1.7–2.4)

## 2017-11-15 MED ORDER — POTASSIUM CHLORIDE CRYS ER 20 MEQ PO TBCR
40.0000 meq | EXTENDED_RELEASE_TABLET | Freq: Once | ORAL | Status: AC
  Administered 2017-11-15: 40 meq via ORAL
  Filled 2017-11-15: qty 2

## 2017-11-15 MED ORDER — BOOST / RESOURCE BREEZE PO LIQD CUSTOM
1.0000 | Freq: Three times a day (TID) | ORAL | Status: DC
  Administered 2017-11-15 – 2017-11-16 (×5): 1 via ORAL

## 2017-11-15 MED ORDER — MAGNESIUM OXIDE 400 (241.3 MG) MG PO TABS
400.0000 mg | ORAL_TABLET | Freq: Two times a day (BID) | ORAL | Status: DC
  Administered 2017-11-15 – 2017-11-16 (×3): 400 mg via ORAL
  Filled 2017-11-15 (×3): qty 1

## 2017-11-15 MED ORDER — POTASSIUM CHLORIDE CRYS ER 20 MEQ PO TBCR
20.0000 meq | EXTENDED_RELEASE_TABLET | Freq: Two times a day (BID) | ORAL | Status: DC
  Administered 2017-11-15 – 2017-11-16 (×3): 20 meq via ORAL
  Filled 2017-11-15 (×3): qty 1

## 2017-11-15 NOTE — Care Management Note (Addendum)
Case Management Note Previous note per:  Cloyde Reams, BSN, RN  Case Manager-Orientation [phone] 11/12/2017, 1:59 PM  Patient Details  Name: Julia Wheeler MRN: [id number] Date of Birth: [birth year]  Subjective/Objective:   Admitted with abdominal pain, near syncope.              Action/Plan: In to speak with Patient.  Prior to admission patient lived at home with son.  PCP Hillery Jacks;  Uses [facility] in [place], [place].  Patient denies inability to afford Food/nutrition, stated she has Food stamps.  Home DME: None.  Uses [employer] for transportation for shopping and medical appointments.  Admits to issues to afford medication.   NCM will continue to follow for discharge needs.  Expected Discharge Date: 11/13/2017                 Expected Discharge Plan:  OP Rehab  Discharge planning Services  CM Consult  Status of Service:  Completed, signed off  Additional Comments: Cloyde Reams, BSN, RN  Case Manager-Orientation [phone] 11/15/2017, 10:46 AM    Received referral for Outpatient PT. In to speak with patient, Discussed recommendations made for outpatient PT; patient agrees with recommendation.   Cloyde Reams, BSN, RN  Case Manager-Orientation [phone] 11/15/2017, 11:40AM Patient with discharge orders today.  Received referral for DME: rolling walker.  In to discuss recommendation for rolling walker.  Patient agrees with recommendation for DME: rolling walker.  Referral called to [facility] with [facility].

## 2017-11-15 NOTE — Progress Notes (Signed)
Physical Therapy Treatment Patient Details Name: Julia Wheeler MRN: [id number] DOB: 24-May-1966 Today's Date: 11/15/2017    History of Present Illness Julia Wheeler is a 52 y.o. female patient admitted with nausea and and abdominal pain.patient has a history of gastric bypass in 2005 and has had multiple GI problems since since 2008 including small bowel obstruction in 2008 and incarcerated hernias. She reported 2-3 days of abdominal pain, nausea, dry heaving and weakness prior to hospitalization. A diagnostic laparoscopy was completed on 12/30 without evidence of intussusception seen on prior imaging. During hospitalization, patient endorsed SI so psychiatry was consulted. Of note, she was admitted to the hospital in 08/2015 for depression with psychosis and SI. She reports fleeting suicidal thoughts for the past year due to multiple psychosocial stressors. She reports recent carpal tunnel surgery in her dominant hand.     PT Comments    Pt admitted with above diagnosis. Pt currently with functional limitations due to balance and endurance deficits. Pt was able to ambulate in halls without LOB with min to mod challenges.  Scored 16/24 on DGI suggesting risk of falls and pt may benefit from use of cane but pt states she does not feel she would use a cane.  Pt has not lost balance in halls with challenges and appears to compensate well for inner ear issues thus far.  Notified nursing that pt can be up in room without assist and pt aware.  Pt's dizziness is improving as well with pt progressing through x1 exercises. Will follow acutely.   Pt will benefit from skilled PT to increase their independence and safety with mobility to allow discharge to the venue listed below.     Follow Up Recommendations  Outpatient PT;Supervision - Intermittent     Equipment Recommendations  Rolling walker with 5" wheels    Recommendations for Other Services       Precautions / Restrictions Precautions Precautions:  Fall Restrictions Weight Bearing Restrictions: No    Mobility  Bed Mobility Overal bed mobility: Independent                Transfers Overall transfer level: Independent                  Ambulation/Gait Ambulation/Gait assistance: Min guard;Supervision Ambulation Distance (Feet): 450 Feet Assistive device: None Gait Pattern/deviations: Step-through pattern;Decreased stride length;Drifts right/left   Gait velocity interpretation: Below normal speed for age/gender General Gait Details: Pt was able to ambulate without device with good stability overall.  AT times with challenges, pt drifts left and right due to inner ear issue but did not lose balance significantly. Pt walks slowly and when asked about this she states that is her baseline.     Stairs            Wheelchair Mobility    Modified Rankin (Stroke Patients Only)       Balance Overall balance assessment: Needs assistance Sitting-balance support: No upper extremity supported;Feet supported Sitting balance-Leahy Scale: Good     Standing balance support: During functional activity;No upper extremity supported Standing balance-Leahy Scale: Fair Standing balance comment: Pt able to withstand min challenges to balance and maintain balance without a device.                  Standardized Balance Assessment Standardized Balance Assessment : Dynamic Gait Index   Dynamic Gait Index Level Surface: Mild Impairment Change in Gait Speed: Mild Impairment Gait with Horizontal Head Turns: Mild Impairment Gait with Vertical Head  Turns: Mild Impairment Gait and Pivot Turn: Mild Impairment Step Over Obstacle: Mild Impairment Step Around Obstacles: Mild Impairment Steps: Mild Impairment Total Score: 16      Cognition Arousal/Alertness: Awake/alert Behavior During Therapy: WFL for tasks assessed/performed Overall Cognitive Status: Within Functional Limits for tasks assessed                                         Exercises Other Exercises Other Exercises: Reviewed x1 exercises.  Pt instructed to continue to perform 2 x every other hour.  Pt performs 1-2 min in standing against corner for safety with feet apart and feet together as well as tandem.  Needed assist to maintain balance with feet in tandem.     General Comments General comments (skin integrity, edema, etc.): Pt scored 16/24 on DGI suggesting risk of falls without device per research.  Pt has not needed device in hospital and did not have LOB with challenges.  Pt performed poorly on this test because she moves very slowly and scores were decr due to that as well. Pt aware that she could use cane for ultimate safety and pt states she may get one.      Pertinent Vitals/Pain Pain Assessment: No/denies pain    Home Living                      Prior Function            PT Goals (current goals can now be found in the care plan section) Acute Rehab PT Goals Patient Stated Goal: to get better Progress towards PT goals: Progressing toward goals    Frequency    Min 3X/week      PT Plan Current plan remains appropriate    Co-evaluation              AM-PAC PT "6 Clicks" Daily Activity  Outcome Measure  Difficulty turning over in bed (including adjusting bedclothes, sheets and blankets)?: None Difficulty moving from lying on back to sitting on the side of the bed? : None Difficulty sitting down on and standing up from a chair with arms (e.g., wheelchair, bedside commode, etc,.)?: None Help needed moving to and from a bed to chair (including a wheelchair)?: A Little Help needed walking in hospital room?: A Little Help needed climbing 3-5 steps with a railing? : A Little 6 Click Score: 21    End of Session Equipment Utilized During Treatment: Gait belt Activity Tolerance: Patient tolerated treatment well Patient left: in bed;with call bell/phone within reach Nurse Communication:  Mobility status(Notified nurse pt can walk in room without assist) PT Visit Diagnosis: Unsteadiness on feet (R26.81);History of falling (Z91.81);Dizziness and giddiness (R42)     Time: 9924-2683 PT Time Calculation (min) (ACUTE ONLY): 19 min  Charges:  $Gait Training: 8-22 mins                    G Codes:       Susannah Carbin,PT Acute Rehabilitation [phone] (pager)    Berline Lopes 11/15/2017, 11:11 AM

## 2017-11-15 NOTE — Discharge Instructions (Signed)

## 2017-11-15 NOTE — Progress Notes (Signed)
Patient ID: Julia Wheeler, female   DOB: 04-21-1966, 52 y.o.   MRN: [id number]  [facility] Surgery Progress Note  4 Days Post-Op  Subjective: CC-  Tired this morning. Denies abdominal pain. Ate small amounts of pork chops for dinner last night. Denies n/v. Passing flatus, no BM yesterday.   Objective: Vital signs in last 24 hours: Temp:  [97.2 F (36.2 C)-98.1 F (36.7 C)] 97.2 F (36.2 C) (01/03 0549) Pulse Rate:  [76-78] 77 (01/03 0549) Resp:  [18] 18 (01/03 0549) BP: (94-102)/(67-76) 94/67 (01/03 0549) SpO2:  [97 %-98 %] 98 % (01/03 0549) Weight:  [164 lb 3.9 oz (74.5 kg)] 164 lb 3.9 oz (74.5 kg) (01/03 0549) Last BM Date: 11/10/17  Intake/Output from previous day: 01/02 0701 - 01/03 0700 In: 240 [P.O.:240] Out: 1050 [Urine:1050] Intake/Output this shift: No intake/output data recorded.  PE: Gen:  Alert, NAD, pleasant HEENT: EOM's intact, pupils equal and round Card:  RRR, no M/G/R heard Pulm:  effort normal Abd: Soft, NT/ND, +BS, multiple lap incisions C/D/I Skin: no rashes noted, warm and dry  Lab Results:  Recent Labs    11/15/17 0521  WBC 3.1*  HGB 9.2*  HCT 28.8*  PLT 201   BMET Recent Labs    11/13/17 0433 11/15/17 0521  NA 138 138  K 3.1* 2.8*  CL 101 100*  CO2 28 31  GLUCOSE 83 82  BUN 13 7  CREATININE 1.35* 1.41*  CALCIUM 8.6* 8.6*   PT/INR No results for input(s): LABPROT, INR in the last 72 hours. CMP     Component Value Date/Time   NA 138 11/15/2017 0521   NA 137 05/04/2016 1626   K 2.8 (L) 11/15/2017 0521   K 4.0 05/04/2016 1626   CL 100 (L) 11/15/2017 0521   CO2 31 11/15/2017 0521   CO2 27 05/04/2016 1626   GLUCOSE 82 11/15/2017 0521   GLUCOSE 187 (H) 05/04/2016 1626   BUN 7 11/15/2017 0521   BUN 18.3 05/04/2016 1626   CREATININE 1.41 (H) 11/15/2017 0521   CREATININE 1.2 (H) 05/04/2016 1626   CALCIUM 8.6 (L) 11/15/2017 0521   CALCIUM 9.8 05/04/2016 1626   PROT 5.4 (L) 11/15/2017 0521   PROT 8.5 (H) 05/04/2016 1626    ALBUMIN 2.7 (L) 11/15/2017 0521   ALBUMIN 4.1 05/04/2016 1626   AST 18 11/15/2017 0521   AST 19 05/04/2016 1626   ALT 14 11/15/2017 0521   ALT 12 05/04/2016 1626   ALKPHOS 55 11/15/2017 0521   ALKPHOS 100 05/04/2016 1626   BILITOT 0.9 11/15/2017 0521   BILITOT 0.65 05/04/2016 1626   GFRNONAA 42 (L) 11/15/2017 0521   GFRAA 49 (L) 11/15/2017 0521   Lipase     Component Value Date/Time   LIPASE 53 (H) 11/11/2017 0451       Studies/Results: No results found.  Anti-infectives: Anti-infectives (From admission, onward)   Start     Dose/Rate Route Frequency Ordered Stop   11/12/17 0600  cefoTEtan (CEFOTAN) 1 g in dextrose 5 % 50 mL IVPB  Status:  Discontinued     1 g 100 mL/hr over 30 Minutes Intravenous To Short Stay 11/11/17 1523 11/11/17 2024   11/11/17 1629  cefoTEtan in Dextrose 5% (CEFOTAN) 2-2.08 GM-%(50ML) IVPB  Status:  Discontinued    Comments:  Lorenda Ishihara   : cabinet override      11/11/17 1629 11/11/17 1632   11/11/17 0800  cefTRIAXone (ROCEPHIN) 1 g in dextrose 5 % 50 mL IVPB  Status:  Discontinued     1 g 100 mL/hr over 30 Minutes Intravenous Every 24 hours 11/11/17 0715 11/11/17 1028       Assessment/Plan AKI Hypokalemia Hypomagnesemia  Concern for small intestine intussusception - negative Hyperactive small intestine  S/p diagnostic laparoscopy 12/30 Dr. Sheliah Hatch  - POD 4 - pain controlled, tolerating small amounts of diet  ID - none currently FEN - heat healthy diet VTE - SCDs, ok for chemical DVT prophylaxis from surgical standpoint Foley - none Follow up - Dr. Sheliah Hatch  Plan - Abdomen benign but patient still not taking in very much PO. Add Boost, give oral potassium and magnesium. Encourage mobilization. Patient will follow up with Dr. Sheliah Hatch in about 2 weeks as outpatient.   LOS: 4 days    Franne Forts , [facility] Surgery 11/15/2017, 7:42 AM Pager: [phone] Consults: [phone] Mon-Fri 7:00 am-4:30  pm Sat-Sun 7:00 am-11:30 am

## 2017-11-15 NOTE — Progress Notes (Signed)
Patient ID: Julia Wheeler, female   DOB: 07/26/1966, 52 y.o.   MRN: [id number]  PROGRESS NOTE    YAELIS SCHARFENBERG  AOZ:[id number] DOB: 1966/09/06 DOA: 11/10/2017 PCP: Oneta Rack, NP   Brief Narrative:  A 52 year old female with history of hypertension, hyperlipidemia, GERD, depression, anxiety, stomach ulcer, iron deficiency anemia, CHF with EF of 45%, chronic back pain, alcohol abuse, GI bleeding, gastric bypass surgery presented with chest pain, shortness of breath and abdominal pain.  She was admitted for chest pain evaluation with abdominal pain.  Subsequent CT revealed jejunal intussusception.  Surgery was consulted.  She underwent diagnostic laparoscopy on 11/11/2017.   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  Surgery has advanced diet as tolerated.  Patient had vomited last night and is still nauseous this morning.  - Pt still reporting poor oral intake. - Once cleared for d/c by Gen surg will start dispo planning  AKI - secondary to poor oral intake -Resolved -Repeat a.m. labs  Chest pain, elevated troponin - EKG nonacute; pain resolved; telemetry SR, troponins flat; ACS ruled out; symptoms secondary to above; no further evaluation suggested  Borderline prolonged QTc - EKG 10/15/17  shows borderline  prolonged QTc, improved; no further evaluation suggested at this point  Suicidal ideation, depression - appreciate psychiatry eval; patient cleared from psychiatry standpoint; follow-up as an outpatient  Chronic systolic CHF, LVEF 45-50%, diffuse hypokinesis on echo 01/2016. - appears compensated; follow I/O  Sickle cell trait  Asymptomatic bacturia - completely asymptomatic, no treatment indicated   Possible alcohol abuse - on CIWA protocol; no evidence of withdrawal  DVT prophylaxis: SCDs Code Status: Full Family Communication: None at bedside Disposition Plan: Home in 1-2 days  Consultants: General surgery  Procedures: Diagnostic laparoscopy on 11/11/2017  Antimicrobials:  None  Subjective: Pt still c/o poor oral intake.  Objective: Vitals:   11/14/17 0408 11/14/17 1221 11/14/17 2023 11/15/17 0549  BP: 98/67 101/71 102/76 94/67  Pulse: 74 78 76 77  Resp: 18 18 18 18   Temp: 97.7 F (36.5 C) 97.8 F (36.6 C) 98.1 F (36.7 C) (!) 97.2 F (36.2 C)  TempSrc: Oral Oral Oral Oral  SpO2: 98% 97% 98% 98%  Weight: 75.3 kg (166 lb 1.6 oz)   74.5 kg (164 lb 3.9 oz)  Height:        Intake/Output Summary (Last 24 hours) at 11/15/2017 1305 Last data filed at 11/15/2017 1235 Gross per 24 hour  Intake 363 ml  Output 800 ml  Net -437 ml   Filed Weights   11/13/17 0450 11/14/17 0408 11/15/17 0549  Weight: 76.5 kg (168 lb 9.6 oz) 75.3 kg (166 lb 1.6 oz) 74.5 kg (164 lb 3.9 oz)    Examination:  General exam: Appears calm and comfortable, in nad. Respiratory system: equal chest rise, no wheezes, no rhales Cardiovascular system: S1 & S2 heard, rate controlled  gastrointestinal  system: Abdomen is nondistended, soft and nontender. Normal bowel sounds heard. Extremities: No cyanosis, clubbing, edema     Data Reviewed: I have personally reviewed following labs and imaging studies  CBC: Recent Labs  Lab 11/10/17 1904 11/11/17 0320 11/12/17 0603 11/15/17 0521  WBC  6.9 6.8 4.6 3.1*  NEUTROABS  --   --   --  1.4*  HGB 12.7 11.2* 9.1* 9.2*  HCT 38.5 34.7* 28.6* 28.8*  MCV 82.4 83.0 83.4 83.5  PLT 302 220 190 201   Basic Metabolic Panel: Recent Labs  Lab 11/10/17 1904 11/12/17 0603 11/13/17 0433 11/15/17 0521  NA 135 137 138 138  K 3.6 3.1* 3.1* 2.8*  CL 91* 98* 101 100*  CO2 26 26 28 31   GLUCOSE 128* 82 83 82  BUN 33* 19 13 7   CREATININE 2.59* 1.81* 1.35* 1.41*  CALCIUM 10.0 8.5* 8.6* 8.6*  MG  --   --   --  1.6*   GFR: Estimated Creatinine Clearance: 49.8 mL/min (A) (by C-G formula based on SCr of 1.41 mg/dL (H)). Liver Function Tests: Recent Labs  Lab 11/11/17 0451 11/12/17 0603 11/15/17 0521  AST 23 19 18   ALT 23 18 14   ALKPHOS 73 59 55  BILITOT 0.9 1.0 0.9  PROT 6.6 5.4* 5.4*  ALBUMIN 3.2* 2.6* 2.7*   Recent Labs  Lab 11/11/17 0451  LIPASE 53*   No results for input(s): AMMONIA in the last 168 hours. Coagulation Profile: No results for input(s): INR, PROTIME in the last 168 hours. Cardiac Enzymes: Recent Labs  Lab 11/11/17 0451 11/11/17 1043 11/11/17 2008  TROPONINI 0.04* 0.04* 0.03*   BNP (last 3 results) No results for input(s): PROBNP in the last 8760 hours. HbA1C: No results for input(s): HGBA1C in the last 72 hours. CBG: Recent Labs  Lab 11/11/17 2116 11/12/17 0758 11/12/17 1134 11/13/17 1143  GLUCAP 84 76 71 76   Lipid Profile: No results for input(s): CHOL, HDL, LDLCALC, TRIG, CHOLHDL, LDLDIRECT in the last 72 hours. Thyroid Function Tests: No results for input(s): TSH, T4TOTAL, FREET4, T3FREE, THYROIDAB in the last 72 hours. Anemia Panel: No results for input(s): VITAMINB12, FOLATE, FERRITIN, TIBC, IRON, RETICCTPCT in the last 72 hours. Sepsis Labs: Recent Labs  Lab 11/10/17 1913 11/10/17 2111  LATICACIDVEN 3.88* 1.45    Recent Results (from the past 240 hour(s))  Blood culture (routine x 2)     Status: None   Collection Time: 11/10/17  8:44 PM  Result Value Ref Range Status    Specimen Description BLOOD LEFT HAND  Final   Special Requests   Final    BOTTLES DRAWN AEROBIC AND ANAEROBIC Blood Culture adequate volume   Culture NO GROWTH 5 DAYS  Final   Report Status 11/15/2017 FINAL  Final  Blood culture (routine x 2)     Status: None   Collection Time: 11/10/17  8:45 PM  Result Value Ref Range Status   Specimen Description BLOOD BLOOD RIGHT FOREARM  Final   Special Requests   Final    BOTTLES DRAWN AEROBIC AND ANAEROBIC Blood Culture adequate volume   Culture NO GROWTH 5 DAYS  Final   Report Status 11/15/2017 FINAL  Final  Surgical pcr screen     Status: None   Collection Time: 11/11/17  4:10 PM  Result Value Ref Range Status   MRSA, PCR NEGATIVE NEGATIVE Final   Staphylococcus aureus NEGATIVE NEGATIVE Final    Comment: (NOTE) The Xpert SA Assay (FDA approved for NASAL specimens in  patients 6 years of age and older), is one component of a comprehensive surveillance program. It is not intended to diagnose infection nor to guide or monitor treatment.          Radiology Studies: No results found.      Scheduled Meds: . atorvastatin  10 mg Oral QHS  . carvedilol  6.25 mg Oral BID WC  . clonazePAM  0.5 mg Oral BID  . cyclobenzaprine  10 mg Oral BID  . DULoxetine  90 mg Oral Daily  . feeding supplement  1 Container Oral TID BM  . folic acid  1 mg Oral Daily  . magnesium oxide  400 mg Oral BID  . multivitamins with iron  1 tablet Oral Daily  . pantoprazole  40 mg Oral BID AC  . potassium chloride  20 mEq Oral BID  . sertraline  100 mg Oral Daily  . sodium chloride flush  3 mL Intravenous Q12H  . thiamine  100 mg Oral Daily   Or  . thiamine  100 mg Intravenous Daily  . traZODone  100 mg Oral QHS   Continuous Infusions: . sodium chloride       LOS: 4 days    Penny Pia, MD [facility] Pager [phone]  If 7PM-7AM, please contact night-coverage [email or web address] Password [email or web address] 11/15/2017, 1:05 PM

## 2017-11-16 MED ORDER — MAGNESIUM SULFATE IN D5W 1-5 GM/100ML-% IV SOLN
1.0000 g | Freq: Once | INTRAVENOUS | Status: AC
  Administered 2017-11-16: 1 g via INTRAVENOUS
  Filled 2017-11-16: qty 100

## 2017-11-16 MED ORDER — POTASSIUM CHLORIDE CRYS ER 20 MEQ PO TBCR
40.0000 meq | EXTENDED_RELEASE_TABLET | Freq: Once | ORAL | Status: AC
  Administered 2017-11-16: 40 meq via ORAL
  Filled 2017-11-16: qty 2

## 2017-11-16 NOTE — Discharge Summary (Signed)
Physician Discharge Summary  VENESHA PETRAITIS ZOX:[id number] DOB: 01-18-1966 DOA: 11/10/2017  PCP: Oneta Rack, NP  Admit date: 11/10/2017 Discharge date: 11/16/2017  Time spent: > 35 minutes  Recommendations for Outpatient Follow-up:  1.    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   MDD (major depressive disorder), recurrent severe, without psychosis (HCC)   Discharge Condition: stable  Diet recommendation: heart healthy  Filed Weights   11/14/17 0408 11/15/17 0549 11/16/17 0527  Weight: 75.3 kg (166 lb 1.6 oz) 74.5 kg (164 lb 3.9 oz) 75 kg (165 lb 6.4 oz)    History of present illness:  52 year old female with history of hypertension, hyperlipidemia, GERD, depression, anxiety, stomach ulcer, iron deficiency anemia, CHF with EF of 45%, chronic back pain, alcohol abuse, GI bleeding, gastric bypass surgery presented with chest pain, shortness of breath and abdominal pain.  She was admitted for chest pain evaluation with abdominal pain.  Subsequent CT revealed jejunal intussusception.  Surgery was consulted.  She underwent diagnostic laparoscopy on 11/11/2017.  Hospital Course:  Small intestine intussusception - Concern for small intestine intussusception - negative Hyperactive small intestine  S/p diagnostic laparoscopy 12/30 Dr. Sheliah Hatch  - POD 5 - cleared for d/c by general surgery  Further known medical problems listed above continue prior to admission medication regimen listed below   Procedures: Please see above   Pulse: 81 99   Resp:  18  Temp:  98.4 F (36.9 C)  SpO2:  100%    General: Pt in nad, alert and awake Cardiovascular: rrr, no rubs Respiratory: no increased wob, no wheezes  Discharge Instructions   Discharge Instructions    Call MD for:  difficulty breathing, headache or visual disturbances   Complete by:  As directed    Call MD for:  severe uncontrolled pain   Complete by:  As directed    Call MD for:  temperature >100.4   Complete by:  As directed    Diet - low sodium heart healthy   Complete by:  As directed    Discharge instructions   Complete by:  As directed    Please follow-up with your primary care physician within the next one or 2 weeks for further evaluation recommendations. I recommend reassessing your potassium and magnesium levels   Increase activity slowly   Complete by:  As directed      Allergies as of 11/16/2017      Reactions   Bee Venom Swelling   Swelling at the site    Lisinopril Swelling   Swelling of left side of face    Morphine And Related    Makes me crazy      Medication List    TAKE these medications   atorvastatin 10 MG tablet Commonly known as:  LIPITOR Take 1 tablet (10 mg total) by mouth daily. What changed:  when to take this   b complex vitamins capsule Take 1 capsule by mouth daily.   carvedilol 6.25 MG tablet Commonly known as:  COREG Take 1 tablet (6.25 mg total) by mouth 2 (two) times daily.  cetirizine 10 MG tablet Commonly known as:  ZYRTEC Take 10 mg by mouth as needed for allergies.   clonazePAM 0.5 MG tablet Commonly known as:  KLONOPIN Take 0.5 mg by mouth 2 (two) times daily.   cyclobenzaprine 10 MG tablet Commonly known as:  FLEXERIL Take 10 mg by mouth 2 (two) times daily.   DULoxetine 30 MG capsule Commonly known as:  CYMBALTA Take 90 mg by mouth daily.   feeding supplement (ENSURE ENLIVE) Liqd Take 237 mLs by mouth 2 (two) times daily between meals.   IRON PO Take 1 tablet by mouth daily.   Oxycodone HCl  10 MG Tabs Take 1 TABLET BY MOUTH EVERY TWELVE HOURS   pantoprazole 40 MG tablet Commonly known as:  PROTONIX Take 1 tablet (40 mg total) by mouth 2 (two) times daily before a meal.   sertraline 100 MG tablet Commonly known as:  ZOLOFT Take 1 tablet (100 mg total) by mouth daily.   traZODone 50 MG tablet Commonly known as:  DESYREL Take 100 mg by mouth at bedtime.   triamterene-hydrochlorothiazide 37.5-25 MG tablet Commonly known as:  MAXZIDE-25 Take 2 tablets by mouth daily.   Vitamin D (Ergocalciferol) 50000 units Caps capsule Commonly known as:  DRISDOL Take 50,000 Units by mouth every 7 (seven) days.      Allergies  Allergen Reactions  . Bee Venom Swelling    Swelling at the site   . Lisinopril Swelling    Swelling of left side of face   . Morphine And Related     Makes me crazy   Follow-up Information    Kinsinger, De Blanch, MD. Go on 11/29/2017.   Specialty:  General Surgery Why:  Your appointment is 11/29/17 at 10:00am. Please arrive 30 minutes prior to your appointment to check in and fill out paperwork. Bring photo ID and insurance information. Contact information: [address] [place] [address] [phone]        [facility] Follow up.   Specialty:  Rehabilitation Why:  They will call your for a start up date and time for your Physical Therapy Contact information: [address] [id number] mc [place] [address] [phone]       [facility] - Dme Follow up.   Why:  Arranged for walker to be delivered to patient room prior to discharge. Contact information: [address] [place] [address] [phone]            The results of significant diagnostics from this hospitalization (including imaging, microbiology, ancillary and laboratory) are listed below for reference.    Significant Diagnostic Studies: Ct Abdomen Pelvis Wo  Contrast  Result Date: 11/11/2017 CLINICAL DATA:  Left upper quadrant pain for several days EXAM: CT ABDOMEN AND PELVIS WITHOUT CONTRAST TECHNIQUE: Multidetector CT imaging of the abdomen and pelvis was performed following the standard protocol without IV contrast. COMPARISON:  05/22/2017 FINDINGS: Lower chest: No acute abnormality. Hepatobiliary: The liver is within normal limits. The gallbladder is partially distended with multiple dependent gallstones. Pancreas: Unremarkable. No pancreatic ductal dilatation or surrounding inflammatory changes. Spleen: Normal in size without focal abnormality. Adrenals/Urinary Tract: The adrenal glands are within normal limits. Kidneys are well visualized bilaterally. No renal calculi or obstructive changes are seen. The bladder is partially distended. Stomach/Bowel: The appendix is within normal limits. Some mild gaseous distension of the colon is seen although no true obstructive changes are noted. There are changes noted in the mid  to distal jejunum on the left consistent with intussusception. This is best visualized on image numbers 57 through 66 of series 3 as well as image number 35 of series 6. This was not identified on the prior exam and is likely the etiology of the patient's underlying discomfort. No definitive mass lesion is identified although distention of the intussusceptum is incomplete. No definitive obstructive changes are noted secondary to this. Postsurgical changes are noted in the stomach stable from the previous exam. Mild sliding-type hiatal hernia is seen. Vascular/Lymphatic: No significant vascular findings are present. No enlarged abdominal or pelvic lymph nodes. Reproductive: Uterus and bilateral adnexa are unremarkable. Other: No abdominal wall hernia or abnormality. No abdominopelvic ascites. Musculoskeletal: Degenerative changes of lumbar spine are seen. No acute bony abnormality is noted. IMPRESSION: Changes consistent with intussusception within  the mid jejunum. This is likely the etiology of the patient's given clinical symptomatology. No obstructive changes are noted. No definitive focal mass is seen although distention of the intussusceptum is somewhat limited. Cholelithiasis without complicating factors. Electronically Signed   By: Alcide Clever M.D.   On: 11/11/2017 08:34   Dg Chest 2 View  Result Date: 11/11/2017 CLINICAL DATA:  Acute onset of generalized chest pain and dry heaves. EXAM: CHEST  2 VIEW COMPARISON:  Chest radiograph performed 01/02/2017 FINDINGS: The lungs are well-aerated and clear. There is no evidence of focal opacification, pleural effusion or pneumothorax. The heart is normal in size; the mediastinal contour is within normal limits. No acute osseous abnormalities are seen. IMPRESSION: No acute cardiopulmonary process seen. Electronically Signed   By: Roanna Raider M.D.   On: 11/11/2017 00:44   Nm Pulmonary Vent And Perf (v/q Scan)  Result Date: 11/11/2017 CLINICAL DATA:  52 y/o F; PE suspected, intermediate probability, positive D-dimer. EXAM: NUCLEAR MEDICINE VENTILATION - PERFUSION LUNG SCAN TECHNIQUE: Ventilation images were obtained in multiple projections using inhaled aerosol Tc-85m DTPA. Perfusion images were obtained in multiple projections after intravenous injection of Tc-67m MAA. RADIOPHARMACEUTICALS:  32.5 mCi Technetium-49m DTPA aerosol inhalation and 4.32 mCi Technetium-20m MAA IV COMPARISON:  None. FINDINGS: Ventilation: No focal ventilation defect. Perfusion: No wedge shaped peripheral perfusion defects to suggest acute pulmonary embolism. IMPRESSION: Normal V/Q scan.  No evidence of pulmonary embolus. Electronically Signed   By: Mitzi Hansen M.D.   On: 11/11/2017 01:55   Dg Abd Portable 1 View  Result Date: 11/11/2017 CLINICAL DATA:  Check nasogastric catheter placement EXAM: PORTABLE ABDOMEN - 1 VIEW COMPARISON:  None. FINDINGS: Scattered large and small bowel gas is noted. A nasogastric  catheter is noted within the distal esophagus coiled upon itself. This should be withdrawn and reinserted. IMPRESSION: Nasogastric catheter coiled upon itself within the distal esophagus. Electronically Signed   By: Alcide Clever M.D.   On: 11/11/2017 12:16    Microbiology: Recent Results (from the past 240 hour(s))  Blood culture (routine x 2)     Status: None   Collection Time: 11/10/17  8:44 PM  Result Value Ref Range Status   Specimen Description BLOOD LEFT HAND  Final   Special Requests   Final    BOTTLES DRAWN AEROBIC AND ANAEROBIC Blood Culture adequate volume   Culture NO GROWTH 5 DAYS  Final   Report Status 11/15/2017 FINAL  Final  Blood culture (routine x 2)     Status: None   Collection Time: 11/10/17  8:45 PM  Result Value Ref Range Status   Specimen Description BLOOD BLOOD RIGHT FOREARM  Final   Special  Requests   Final    BOTTLES DRAWN AEROBIC AND ANAEROBIC Blood Culture adequate volume   Culture NO GROWTH 5 DAYS  Final   Report Status 11/15/2017 FINAL  Final  Surgical pcr screen     Status: None   Collection Time: 11/11/17  4:10 PM  Result Value Ref Range Status   MRSA, PCR NEGATIVE NEGATIVE Final   Staphylococcus aureus NEGATIVE NEGATIVE Final    Comment: (NOTE) The Xpert SA Assay (FDA approved for NASAL specimens in patients 44 years of age and older), is one component of a comprehensive surveillance program. It is not intended to diagnose infection nor to guide or monitor treatment.      Labs: Basic Metabolic Panel: Recent Labs  Lab 11/10/17 1904 11/12/17 0603 11/13/17 0433 11/15/17 0521  NA 135 137 138 138  K 3.6 3.1* 3.1* 2.8*  CL 91* 98* 101 100*  CO2 26 26 28 31   GLUCOSE 128* 82 83 82  BUN 33* 19 13 7   CREATININE 2.59* 1.81* 1.35* 1.41*  CALCIUM 10.0 8.5* 8.6* 8.6*  MG  --   --   --  1.6*   Liver Function Tests: Recent Labs  Lab 11/11/17 0451 11/12/17 0603 11/15/17 0521  AST 23 19 18   ALT 23 18 14   ALKPHOS 73 59 55  BILITOT 0.9 1.0 0.9   PROT 6.6 5.4* 5.4*  ALBUMIN 3.2* 2.6* 2.7*   Recent Labs  Lab 11/11/17 0451  LIPASE 53*   No results for input(s): AMMONIA in the last 168 hours. CBC: Recent Labs  Lab 11/10/17 1904 11/11/17 0320 11/12/17 0603 11/15/17 0521  WBC 6.9 6.8 4.6 3.1*  NEUTROABS  --   --   --  1.4*  HGB 12.7 11.2* 9.1* 9.2*  HCT 38.5 34.7* 28.6* 28.8*  MCV 82.4 83.0 83.4 83.5  PLT 302 220 190 201   Cardiac Enzymes: Recent Labs  Lab 11/11/17 0451 11/11/17 1043 11/11/17 2008  TROPONINI 0.04* 0.04* 0.03*   BNP: BNP (last 3 results) Recent Labs    11/11/17 0320  BNP 18.3    ProBNP (last 3 results) No results for input(s): PROBNP in the last 8760 hours.  CBG: Recent Labs  Lab 11/11/17 2116 11/12/17 0758 11/12/17 1134 11/13/17 1143  GLUCAP 84 76 71 76    Signed:  Penny Pia MD.  [facility] 11/16/2017, 3:51 PM

## 2017-11-16 NOTE — Clinical Social Work Note (Signed)
Clinical Social Work Assessment  Patient Details  Name: Julia Wheeler MRN: [id number] Date of Birth: [birth year]  Date of referral:  11/16/17               Reason for consult:  Housing Concerns/Homelessness, Transportation, [employer]                Permission sought to share information with:    Permission granted to share information::  No  Name::        Agency::     Relationship::     Contact Information:     Housing/Transportation Living arrangements for the past 2 months:  Apartment Source of Information:  Patient, Medical Team Patient Interpreter Needed:  None Criminal Activity/Legal Involvement Pertinent to Current Situation/Hospitalization:  No - Comment as needed Significant Relationships:  Adult Children, Friend, Siblings Lives with:  Adult Children Do you feel safe going back to the place where you live?  Yes Need for family participation in patient care:  Yes (Comment)  Care giving concerns:  RNCM asked CSW to provided community resources to patient.   Social Worker assessment / plan:  CSW met with patient. No supports at bedside. CSW introduced role and inquired about resources needed. Patient is unemployed and will soon not have a place to live. According to the PT evaluation, she is living with her son until March. Patient takes Mel[facility] Popper when needed but needs information on transportation to doctor's appointments. CSW provided patient with resources for emergency assistance, food resources, [facility], shelters, and low-income housing options. Patient's son will transport her home today. No further concerns. CSW signing off as social work intervention is no longer needed.  Employment status:  Unemployed Forensic scientist:  Self Pay (Medicaid Pending) PT Recommendations:  Outpatient Therapies Information / Referral to community resources:  Shelter, Other (Comment Required)(Emergency assistance, food resources, IRC, low-income housing.)  Patient/Family's Response to  care:  Patient agreeable to receiving resources. Patient's son supportive and involved in patient's care. Patient appreciated social work intervention.  Patient/Family's Understanding of and Emotional Response to Diagnosis, Current Treatment, and Prognosis:  Patient has a good understanding of the reason for admission and social work consult. Patient appears pleased with hospital care.  Emotional Assessment Appearance:  Appears stated age Attitude/Demeanor/Rapport:  Engaged Affect (typically observed):  Depressed, Pleasant Orientation:  Oriented to Self, Oriented to Place, Oriented to  Time, Oriented to Situation Alcohol / Substance use:  Never Used Psych involvement (Current and /or in the community):  No (Comment)  Discharge Needs  Concerns to be addressed:  No discharge needs identified Readmission within the last 30 days:  No Current discharge risk:  None Barriers to Discharge:  No Barriers Identified   Candie Chroman, LCSW 11/16/2017, 4:47 PM

## 2017-11-16 NOTE — Progress Notes (Signed)
5 Days Post-Op   Subjective/Chief Complaint: Ate better yesterday, wants to go home   Objective: Vital signs in last 24 hours: Temp:  [98 F (36.7 C)-98.5 F (36.9 C)] 98.2 F (36.8 C) (01/04 0527) Pulse Rate:  [65-90] 65 (01/04 0527) Resp:  [18] 18 (01/04 0527) BP: (93-99)/(62-67) 93/62 (01/04 0527) SpO2:  [92 %-98 %] 96 % (01/04 0527) Weight:  [75 kg (165 lb 6.4 oz)] 75 kg (165 lb 6.4 oz) (01/04 0527) Last BM Date: 11/10/17  Intake/Output from previous day: 01/03 0701 - 01/04 0700 In: 243 [P.O.:240; I.V.:3] Out: 1300 [Urine:1300] Intake/Output this shift: No intake/output data recorded.  General appearance: alert and cooperative GI: soft, active BS, incisions OK  Lab Results:  Recent Labs    11/15/17 0521  WBC 3.1*  HGB 9.2*  HCT 28.8*  PLT 201   BMET Recent Labs    11/15/17 0521  NA 138  K 2.8*  CL 100*  CO2 31  GLUCOSE 82  BUN 7  CREATININE 1.41*  CALCIUM 8.6*   PT/INR No results for input(s): LABPROT, INR in the last 72 hours. ABG No results for input(s): PHART, HCO3 in the last 72 hours.  Invalid input(s): PCO2, PO2  Studies/Results: No results found.  Anti-infectives: Anti-infectives (From admission, onward)   Start     Dose/Rate Route Frequency Ordered Stop   11/12/17 0600  cefoTEtan (CEFOTAN) 1 g in dextrose 5 % 50 mL IVPB  Status:  Discontinued     1 g 100 mL/hr over 30 Minutes Intravenous To Short Stay 11/11/17 1523 11/11/17 2024   11/11/17 1629  cefoTEtan in Dextrose 5% (CEFOTAN) 2-2.08 GM-%(50ML) IVPB  Status:  Discontinued    Comments:  Lorenda Ishihara   : cabinet override      11/11/17 1629 11/11/17 1632   11/11/17 0800  cefTRIAXone (ROCEPHIN) 1 g in dextrose 5 % 50 mL IVPB  Status:  Discontinued     1 g 100 mL/hr over 30 Minutes Intravenous Every 24 hours 11/11/17 0715 11/11/17 1028      Assessment/Plan: Concern for small intestine intussusception - negative Hyperactive small intestine  S/p diagnostic laparoscopy 12/30 Dr.  Sheliah Hatch  - POD 5 - eating better  ID - none currently FEN - heat healthy diet VTE - SCDs, ok for chemical DVT prophylaxis from surgical standpoint Foley - none Follow up - Dr. Sheliah Hatch  Plan - Ready for D/C from our standpoint. Patient will follow up with Dr. Sheliah Hatch in about 2 weeks as outpatient.   LOS: 5 days    Liz Malady 11/16/2017

## 2017-11-16 NOTE — Progress Notes (Signed)
Pt IV discontinued, catheter intact and telemetry removed. Pt discharge teaching provided at bedside. Pt has all belongings including walker. Pt awaiting transportation for discharged. Pt son called.

## 2017-11-21 ENCOUNTER — Other Ambulatory Visit: Payer: Self-pay

## 2017-11-21 ENCOUNTER — Encounter (HOSPITAL_COMMUNITY): Payer: Self-pay | Admitting: Emergency Medicine

## 2017-11-21 DIAGNOSIS — Z79899 Other long term (current) drug therapy: Secondary | ICD-10-CM | POA: Insufficient documentation

## 2017-11-21 DIAGNOSIS — I5022 Chronic systolic (congestive) heart failure: Secondary | ICD-10-CM | POA: Insufficient documentation

## 2017-11-21 DIAGNOSIS — R45851 Suicidal ideations: Secondary | ICD-10-CM | POA: Insufficient documentation

## 2017-11-21 DIAGNOSIS — F341 Dysthymic disorder: Secondary | ICD-10-CM | POA: Insufficient documentation

## 2017-11-21 DIAGNOSIS — I110 Hypertensive heart disease with heart failure: Secondary | ICD-10-CM | POA: Insufficient documentation

## 2017-11-21 LAB — RAPID URINE DRUG SCREEN, HOSP PERFORMED
Amphetamines: NOT DETECTED
BENZODIAZEPINES: NOT DETECTED
Barbiturates: NOT DETECTED
COCAINE: NOT DETECTED
Opiates: NOT DETECTED
Tetrahydrocannabinol: NOT DETECTED

## 2017-11-21 LAB — CBC
HCT: 31.1 % — ABNORMAL LOW (ref 36.0–46.0)
Hemoglobin: 9.9 g/dL — ABNORMAL LOW (ref 12.0–15.0)
MCH: 26.6 pg (ref 26.0–34.0)
MCHC: 31.8 g/dL (ref 30.0–36.0)
MCV: 83.6 fL (ref 78.0–100.0)
PLATELETS: 370 10*3/uL (ref 150–400)
RBC: 3.72 MIL/uL — ABNORMAL LOW (ref 3.87–5.11)
RDW: 17.2 % — AB (ref 11.5–15.5)
WBC: 7 10*3/uL (ref 4.0–10.5)

## 2017-11-21 LAB — COMPREHENSIVE METABOLIC PANEL
ALBUMIN: 3.4 g/dL — AB (ref 3.5–5.0)
ALT: 13 U/L — ABNORMAL LOW (ref 14–54)
AST: 18 U/L (ref 15–41)
Alkaline Phosphatase: 71 U/L (ref 38–126)
Anion gap: 8 (ref 5–15)
BILIRUBIN TOTAL: 0.6 mg/dL (ref 0.3–1.2)
BUN: 7 mg/dL (ref 6–20)
CALCIUM: 9.5 mg/dL (ref 8.9–10.3)
CO2: 27 mmol/L (ref 22–32)
Chloride: 105 mmol/L (ref 101–111)
Creatinine, Ser: 0.97 mg/dL (ref 0.44–1.00)
GFR calc Af Amer: >60 mL/min (ref >60)
GLUCOSE: 89 mg/dL (ref 65–99)
POTASSIUM: 4.4 mmol/L (ref 3.5–5.1)
Sodium: 140 mmol/L (ref 135–145)
TOTAL PROTEIN: 6.7 g/dL (ref 6.5–8.1)

## 2017-11-21 LAB — ACETAMINOPHEN LEVEL

## 2017-11-21 LAB — I-STAT BETA HCG BLOOD, ED (MC, WL, AP ONLY)

## 2017-11-21 LAB — ETHANOL

## 2017-11-21 LAB — SALICYLATE LEVEL: Salicylate Lvl: <7 mg/dL (ref 2.8–30.0)

## 2017-11-21 NOTE — ED Notes (Signed)
Pt x1 for vitals. No answer
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
## 2017-11-21 NOTE — ED Notes (Signed)
Pt called x1 for vitals. No answer 

## 2017-11-22 ENCOUNTER — Encounter (HOSPITAL_COMMUNITY): Payer: Self-pay | Admitting: Emergency Medicine

## 2017-11-22 ENCOUNTER — Other Ambulatory Visit: Payer: Self-pay

## 2017-11-22 ENCOUNTER — Inpatient Hospital Stay (HOSPITAL_COMMUNITY)
Admission: AD | Admit: 2017-11-22 | Discharge: 2017-11-28 | DRG: 885 | Disposition: A | Discharge status: Other Acute Inpatient Hospital | Payer: Federal, State, Local not specified - Other | Source: Intra-hospital | Attending: Psychiatry | Admitting: Psychiatry

## 2017-11-22 ENCOUNTER — Encounter (HOSPITAL_COMMUNITY): Payer: Self-pay | Admitting: *Deleted

## 2017-11-22 ENCOUNTER — Emergency Department (HOSPITAL_COMMUNITY)
Admission: EM | Admit: 2017-11-22 | Discharge: 2017-11-22 | Disposition: A | Discharge status: Other Acute Inpatient Hospital | Payer: Medicaid Other | Attending: Emergency Medicine | Admitting: Emergency Medicine

## 2017-11-22 DIAGNOSIS — Z915 Personal history of self-harm: Secondary | ICD-10-CM | POA: Diagnosis not present

## 2017-11-22 DIAGNOSIS — E782 Mixed hyperlipidemia: Secondary | ICD-10-CM | POA: Diagnosis present

## 2017-11-22 DIAGNOSIS — D509 Iron deficiency anemia, unspecified: Secondary | ICD-10-CM | POA: Diagnosis present

## 2017-11-22 DIAGNOSIS — G4700 Insomnia, unspecified: Secondary | ICD-10-CM | POA: Diagnosis not present

## 2017-11-22 DIAGNOSIS — Z8719 Personal history of other diseases of the digestive system: Secondary | ICD-10-CM

## 2017-11-22 DIAGNOSIS — R45851 Suicidal ideations: Secondary | ICD-10-CM | POA: Diagnosis present

## 2017-11-22 DIAGNOSIS — Z888 Allergy status to other drugs, medicaments and biological substances status: Secondary | ICD-10-CM | POA: Diagnosis not present

## 2017-11-22 DIAGNOSIS — K219 Gastro-esophageal reflux disease without esophagitis: Secondary | ICD-10-CM | POA: Diagnosis present

## 2017-11-22 DIAGNOSIS — R441 Visual hallucinations: Secondary | ICD-10-CM | POA: Diagnosis not present

## 2017-11-22 DIAGNOSIS — Z9114 Patient's other noncompliance with medication regimen: Secondary | ICD-10-CM | POA: Diagnosis not present

## 2017-11-22 DIAGNOSIS — Z818 Family history of other mental and behavioral disorders: Secondary | ICD-10-CM | POA: Diagnosis not present

## 2017-11-22 DIAGNOSIS — I110 Hypertensive heart disease with heart failure: Secondary | ICD-10-CM | POA: Diagnosis present

## 2017-11-22 DIAGNOSIS — Z560 Unemployment, unspecified: Secondary | ICD-10-CM | POA: Diagnosis not present

## 2017-11-22 DIAGNOSIS — Z885 Allergy status to narcotic agent status: Secondary | ICD-10-CM

## 2017-11-22 DIAGNOSIS — F39 Unspecified mood [affective] disorder: Secondary | ICD-10-CM | POA: Diagnosis not present

## 2017-11-22 DIAGNOSIS — Z9851 Tubal ligation status: Secondary | ICD-10-CM | POA: Diagnosis not present

## 2017-11-22 DIAGNOSIS — F4310 Post-traumatic stress disorder, unspecified: Secondary | ICD-10-CM | POA: Diagnosis present

## 2017-11-22 DIAGNOSIS — R440 Auditory hallucinations: Secondary | ICD-10-CM | POA: Diagnosis not present

## 2017-11-22 DIAGNOSIS — I5022 Chronic systolic (congestive) heart failure: Secondary | ICD-10-CM | POA: Diagnosis present

## 2017-11-22 DIAGNOSIS — F418 Other specified anxiety disorders: Secondary | ICD-10-CM | POA: Diagnosis present

## 2017-11-22 DIAGNOSIS — Z8249 Family history of ischemic heart disease and other diseases of the circulatory system: Secondary | ICD-10-CM

## 2017-11-22 DIAGNOSIS — F419 Anxiety disorder, unspecified: Secondary | ICD-10-CM | POA: Diagnosis not present

## 2017-11-22 DIAGNOSIS — E538 Deficiency of other specified B group vitamins: Secondary | ICD-10-CM | POA: Diagnosis present

## 2017-11-22 DIAGNOSIS — Z91410 Personal history of adult physical and sexual abuse: Secondary | ICD-10-CM | POA: Diagnosis not present

## 2017-11-22 DIAGNOSIS — D573 Sickle-cell trait: Secondary | ICD-10-CM | POA: Diagnosis present

## 2017-11-22 DIAGNOSIS — Z9884 Bariatric surgery status: Secondary | ICD-10-CM

## 2017-11-22 DIAGNOSIS — Z62810 Personal history of physical and sexual abuse in childhood: Secondary | ICD-10-CM | POA: Diagnosis not present

## 2017-11-22 DIAGNOSIS — F333 Major depressive disorder, recurrent, severe with psychotic symptoms: Principal | ICD-10-CM | POA: Diagnosis present

## 2017-11-22 DIAGNOSIS — F323 Major depressive disorder, single episode, severe with psychotic features: Secondary | ICD-10-CM | POA: Insufficient documentation

## 2017-11-22 DIAGNOSIS — Z91030 Bee allergy status: Secondary | ICD-10-CM

## 2017-11-22 DIAGNOSIS — Z79899 Other long term (current) drug therapy: Secondary | ICD-10-CM | POA: Diagnosis not present

## 2017-11-22 DIAGNOSIS — F341 Dysthymic disorder: Secondary | ICD-10-CM

## 2017-11-22 MED ORDER — MAGNESIUM HYDROXIDE 400 MG/5ML PO SUSP
30.0000 mL | Freq: Every day | ORAL | Status: DC | PRN
  Administered 2017-11-27: 30 mL via ORAL
  Filled 2017-11-22: qty 30

## 2017-11-22 MED ORDER — CARVEDILOL 6.25 MG PO TABS
6.2500 mg | ORAL_TABLET | Freq: Two times a day (BID) | ORAL | Status: DC
  Administered 2017-11-23 – 2017-11-28 (×11): 6.25 mg via ORAL
  Filled 2017-11-22 (×14): qty 1

## 2017-11-22 MED ORDER — ACETAMINOPHEN 325 MG PO TABS: 650.0000 mg | ORAL_TABLET | ORAL | Status: DC | PRN

## 2017-11-22 MED ORDER — TRIAMTERENE-HCTZ 37.5-25 MG PO TABS
2.0000 | ORAL_TABLET | Freq: Every day | ORAL | Status: DC
  Administered 2017-11-23 – 2017-11-28 (×6): 2 via ORAL
  Filled 2017-11-22 (×8): qty 2

## 2017-11-22 MED ORDER — HYDROXYZINE HCL 25 MG PO TABS
25.0000 mg | ORAL_TABLET | Freq: Four times a day (QID) | ORAL | Status: DC | PRN
  Administered 2017-11-22 – 2017-11-23 (×4): 25 mg via ORAL
  Filled 2017-11-22 (×4): qty 1

## 2017-11-22 MED ORDER — LORATADINE 10 MG PO TABS
10.0000 mg | ORAL_TABLET | Freq: Every day | ORAL | Status: DC
  Administered 2017-11-23 – 2017-11-28 (×6): 10 mg via ORAL
  Filled 2017-11-22 (×9): qty 1

## 2017-11-22 MED ORDER — ACETAMINOPHEN 325 MG PO TABS: 650.0000 mg | ORAL_TABLET | Freq: Four times a day (QID) | ORAL | Status: DC | PRN

## 2017-11-22 MED ORDER — ONDANSETRON HCL 4 MG PO TABS: 4.0000 mg | ORAL_TABLET | Freq: Three times a day (TID) | ORAL | Status: DC | PRN

## 2017-11-22 MED ORDER — ALUM & MAG HYDROXIDE-SIMETH 200-200-20 MG/5ML PO SUSP: 30.0000 mL | ORAL | Status: DC | PRN

## 2017-11-22 MED ORDER — TRAZODONE HCL 100 MG PO TABS
100.0000 mg | ORAL_TABLET | Freq: Every day | ORAL | Status: DC
  Administered 2017-11-22: 100 mg via ORAL
  Filled 2017-11-22 (×4): qty 1

## 2017-11-22 MED ORDER — SERTRALINE HCL 100 MG PO TABS
100.0000 mg | ORAL_TABLET | Freq: Every day | ORAL | Status: DC
  Administered 2017-11-23 – 2017-11-26 (×4): 100 mg via ORAL
  Filled 2017-11-22 (×6): qty 1

## 2017-11-22 MED ORDER — ATORVASTATIN CALCIUM 10 MG PO TABS
10.0000 mg | ORAL_TABLET | Freq: Every day | ORAL | Status: DC
  Administered 2017-11-22 – 2017-11-27 (×6): 10 mg via ORAL
  Filled 2017-11-22 (×10): qty 1

## 2017-11-22 NOTE — Progress Notes (Signed)
D   Pt appears to be responding to internal stimuli and having some mild thought blocking    She has a fearful expression on her face but her verbalizations are appropriate and her behavior is appropriate even though she interacts minimally with others  A   Verbal  support given    Medications administered and effectiveness monitored    Q 15 min checks R   Pt is safe at present time and receptive to verbal support

## 2017-11-22 NOTE — Tx Team (Signed)
Initial Treatment Plan 11/22/2017 3:01 PM [name] VEL:[id number]    PATIENT STRESSORS: Legal issue Medication change or noncompliance   PATIENT STRENGTHS: Ability for insight General fund of knowledge Motivation for treatment/growth Physical Health   PATIENT IDENTIFIED PROBLEMS: anxiety  depression  insomnia  Racing thoughts               DISCHARGE CRITERIA:  Ability to meet basic life and health needs Adequate post-discharge living arrangements Improved stabilization in mood, thinking, and/or behavior Motivation to continue treatment in a less acute level of care Need for constant or close observation no longer present Reduction of life-threatening or endangering symptoms to within safe limits Safe-care adequate arrangements made Verbal commitment to aftercare and medication compliance  PRELIMINARY DISCHARGE PLAN: Outpatient therapy Return to previous living arrangement  PATIENT/FAMILY INVOLVEMENT: This treatment plan has been presented to and reviewed with the patient, Julia Wheeler, and/or family member, .  The patient and family have been given the opportunity to ask questions and make suggestions.  Beatrix Shipper, RN 11/22/2017, 3:01 PM

## 2017-11-22 NOTE — ED Notes (Signed)
Report to christa

## 2017-11-22 NOTE — Progress Notes (Signed)
Per Julia Wheeler , [facility], patient has been accepted to [facility], bed 301-2 ; Accepting provider is Assunta Found, NP; Attending provider is Dr. Jama Flavors.   Patient can arrive at 11:30am. Number for report is [phone].   Julia Wheeler, [facility] notified the patient's nurse.    Julia Wheeler, MSW, LCSWA Clinical Social Worker (Disposition) [facility]  [phone]
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
## 2017-11-22 NOTE — [facility] Assessment (Signed)
[facility] Assessment Progress Note  Case discussed with Donell Sievert, PA who recommends inpt treatment. TTS spoke with the pt's nurse Lorin Picket, RN to advise of disposition. RN states he will advise the EDP of the recommendation.   Princess Bruins, MSW, LCSW Therapeutic Triage Specialist  [phone]

## 2017-11-22 NOTE — ED Notes (Signed)
BH counselor recommended the pt for placement however they will have to look outside because [facility] is at capacity.   

## 2017-11-22 NOTE — Progress Notes (Signed)
Pt came in with no belongings to lock up.
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
## 2017-11-23 DIAGNOSIS — Z5681 Sexual harassment on the job: Secondary | ICD-10-CM

## 2017-11-23 DIAGNOSIS — F4310 Post-traumatic stress disorder, unspecified: Secondary | ICD-10-CM

## 2017-11-23 DIAGNOSIS — R450 Nervousness: Secondary | ICD-10-CM

## 2017-11-23 DIAGNOSIS — I10 Essential (primary) hypertension: Secondary | ICD-10-CM

## 2017-11-23 DIAGNOSIS — G4700 Insomnia, unspecified: Secondary | ICD-10-CM

## 2017-11-23 DIAGNOSIS — D649 Anemia, unspecified: Secondary | ICD-10-CM

## 2017-11-23 DIAGNOSIS — E785 Hyperlipidemia, unspecified: Secondary | ICD-10-CM

## 2017-11-23 DIAGNOSIS — R440 Auditory hallucinations: Secondary | ICD-10-CM

## 2017-11-23 DIAGNOSIS — Z91410 Personal history of adult physical and sexual abuse: Secondary | ICD-10-CM

## 2017-11-23 DIAGNOSIS — F333 Major depressive disorder, recurrent, severe with psychotic symptoms: Principal | ICD-10-CM

## 2017-11-23 DIAGNOSIS — Z62810 Personal history of physical and sexual abuse in childhood: Secondary | ICD-10-CM

## 2017-11-23 DIAGNOSIS — Z818 Family history of other mental and behavioral disorders: Secondary | ICD-10-CM

## 2017-11-23 DIAGNOSIS — F419 Anxiety disorder, unspecified: Secondary | ICD-10-CM

## 2017-11-23 MED ORDER — HYDROXYZINE HCL 50 MG PO TABS
50.0000 mg | ORAL_TABLET | Freq: Four times a day (QID) | ORAL | Status: DC | PRN
  Administered 2017-11-23 – 2017-11-25 (×2): 50 mg via ORAL
  Filled 2017-11-23 (×3): qty 1

## 2017-11-23 MED ORDER — LAMOTRIGINE 25 MG PO TABS
25.0000 mg | ORAL_TABLET | Freq: Every day | ORAL | Status: DC
  Administered 2017-11-23 – 2017-11-26 (×4): 25 mg via ORAL
  Filled 2017-11-23 (×6): qty 1

## 2017-11-23 MED ORDER — TRAZODONE HCL 100 MG PO TABS
100.0000 mg | ORAL_TABLET | Freq: Every evening | ORAL | Status: DC | PRN
  Administered 2017-11-23 – 2017-11-25 (×4): 100 mg via ORAL
  Filled 2017-11-23 (×4): qty 1

## 2017-11-23 MED ORDER — FERROUS SULFATE 325 (65 FE) MG PO TABS
ORAL_TABLET | Freq: Every day | ORAL | Status: DC
Start: 2017-11-23 — End: 2017-11-28
  Administered 2017-11-23 – 2017-11-28 (×6): 325 mg via ORAL
  Filled 2017-11-23 (×9): qty 1

## 2017-11-23 MED ORDER — ARIPIPRAZOLE 5 MG PO TABS
5.0000 mg | ORAL_TABLET | Freq: Every day | ORAL | Status: DC
  Administered 2017-11-23 – 2017-11-24 (×2): 5 mg via ORAL
  Filled 2017-11-23 (×5): qty 1

## 2017-11-23 NOTE — Progress Notes (Signed)
Patient stated having a good day. Patient's goal for today was to just be here.

## 2017-11-23 NOTE — Progress Notes (Signed)
Pt attend AA meeting 

## 2017-11-23 NOTE — Progress Notes (Signed)
D   Pt appears to be responding to internal stimuli and having some mild thought blocking    She has a fearful expression on her face but her verbalizations are appropriate and her behavior is appropriate even though she interacts minimally with others   Pt attended AA group this evening   She seems to be more confused tonight A   Verbal  support given    Medications administered and effectiveness monitored    Q 15 min checks R   Pt is safe at present time and receptive to verbal support
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
## 2017-11-23 NOTE — Progress Notes (Signed)
D: Patient remains guarded and anxious.  She has been requesting vistaril every 6 hours.  Spoke with NP about patient and she was given abilify and lamictal.  Patient states, "I just feel out of sorts.  All these people talking around here."  She appears to be thought blocking and possibly responding to internal stimuli.  Patient denies any substance abuse.  Patient states she has been here before, but does remember which hall she was on.  She appears mildly confused.  She paces the hall at times and appears anxious.  Her affect is flat, blunted; her mood is anxious.  She reports poor sleep and appetite; her energy level is low and her concentration is good.  She rates her depression as a 7; hopelessness as a 4 and anxiety as an 8.  She complains of "racing thoughts."  She denies any thoughts of self harm.    A: Continue to monitor medication management and MD orders.  Safety checks continued every 15 minutes per protocol.  Offer support and encouragement as needed.  R: Patient is receptive to staff; she need reassurance frequently.
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
## 2017-11-23 NOTE — Tx Team (Signed)
Interdisciplinary Treatment and Diagnostic Plan Update  11/23/2017 Time of Session: 0830AM Julia Wheeler MRN: [id number]  Principal Diagnosis: MDD, single episode, severe with psychosis  Secondary Diagnoses: Active Problems:   MDD (major depressive disorder), single episode, severe with psychosis (HCC)   Current Medications:  Current Facility-Administered Medications  Medication Dose Route Frequency Provider Last Rate Last Dose  . acetaminophen (TYLENOL) tablet 650 mg  650 mg Oral Q6H PRN Rankin, Shuvon B, NP      . alum & mag hydroxide-simeth (MAALOX/MYLANTA) 200-200-20 MG/5ML suspension 30 mL  30 mL Oral Q4H PRN Rankin, Shuvon B, NP      . atorvastatin (LIPITOR) tablet 10 mg  10 mg Oral QHS Rankin, Shuvon B, NP   10 mg at 11/22/17 2135  . carvedilol (COREG) tablet 6.25 mg  6.25 mg Oral BID WC Rankin, Shuvon B, NP      . hydrOXYzine (ATARAX/VISTARIL) tablet 25 mg  25 mg Oral Q6H PRN Rankin, Shuvon B, NP   25 mg at 11/23/17 0816  . loratadine (CLARITIN) tablet 10 mg  10 mg Oral Daily Rankin, Shuvon B, NP   10 mg at 11/23/17 0816  . magnesium hydroxide (MILK OF MAGNESIA) suspension 30 mL  30 mL Oral Daily PRN Rankin, Shuvon B, NP      . sertraline (ZOLOFT) tablet 100 mg  100 mg Oral Daily Rankin, Shuvon B, NP   100 mg at 11/23/17 0816  . traZODone (DESYREL) tablet 100 mg  100 mg Oral QHS Rankin, Shuvon B, NP   100 mg at 11/22/17 2135  . triamterene-hydrochlorothiazide (MAXZIDE-25) 37.5-25 MG per tablet 2 tablet  2 tablet Oral Daily Rankin, Shuvon B, NP   2 tablet at 11/23/17 0816   PTA Medications: Medications Prior to Admission  Medication Sig Dispense Refill Last Dose  . atorvastatin (LIPITOR) 10 MG tablet Take 1 tablet (10 mg total) by mouth daily. (Patient taking differently: Take 10 mg by mouth at bedtime. )  3 Past Week at Unknown time  . b complex vitamins capsule Take 1 capsule by mouth daily. (Patient not taking: Reported on 11/10/2017) 30 capsule 2 Not Taking at Unknown time  .  carvedilol (COREG) 6.25 MG tablet Take 1 tablet (6.25 mg total) by mouth 2 (two) times daily. 180 tablet 3 Past Week at Unknown time  . cetirizine (ZYRTEC) 10 MG tablet Take 10 mg by mouth as needed for allergies.   2 Past Week at Unknown time  . clonazePAM (KLONOPIN) 0.5 MG tablet Take 0.5 mg by mouth 2 (two) times daily.    Past Week at Unknown time  . cyclobenzaprine (FLEXERIL) 10 MG tablet Take 10 mg by mouth 2 (two) times daily.    Past Week at Unknown time  . DULoxetine (CYMBALTA) 30 MG capsule Take 90 mg by mouth daily.    Past Week at Unknown time  . feeding supplement, ENSURE ENLIVE, (ENSURE ENLIVE) LIQD Take 237 mLs by mouth 2 (two) times daily between meals. (Patient not taking: Reported on 11/10/2017) 30 Bottle 0 Not Taking at Unknown time  . IRON PO Take 1 tablet by mouth daily.    Past Week at Unknown time  . Oxycodone HCl 10 MG TABS Take 1 TABLET BY MOUTH EVERY TWELVE HOURS  0 Past Week at Unknown time  . pantoprazole (PROTONIX) 40 MG tablet Take 1 tablet (40 mg total) by mouth 2 (two) times daily before a meal. 60 tablet 0 Past Week at Unknown time  . sertraline (ZOLOFT) 100  MG tablet Take 1 tablet (100 mg total) by mouth daily. 30 tablet 0 Past Week at Unknown time  . traZODone (DESYREL) 50 MG tablet Take 100 mg by mouth at bedtime.    Past Week at Unknown time  . triamterene-hydrochlorothiazide (MAXZIDE-25) 37.5-25 MG tablet Take 2 tablets by mouth daily.   Past Week at Unknown time  . Vitamin D, Ergocalciferol, (DRISDOL) 50000 units CAPS capsule Take 50,000 Units by mouth every 7 (seven) days.   11/05/2017 at Unknown time    Patient Stressors: Legal issue Medication change or noncompliance  Patient Strengths: Ability for insight General fund of knowledge Motivation for treatment/growth Physical Health  Treatment Modalities: Medication Management, Group therapy, Case management,  1 to 1 session with clinician, Psychoeducation, Recreational therapy.   Physician Treatment  Plan for Primary Diagnosis: MDD, single episode, severe with psychosis  Medication Management: Evaluate patient's response, side effects, and tolerance of medication regimen.  Therapeutic Interventions: 1 to 1 sessions, Unit Group sessions and Medication administration.  Evaluation of Outcomes: Progressing  Physician Treatment Plan for Secondary Diagnosis: Active Problems:   MDD (major depressive disorder), single episode, severe with psychosis (HCC)   Medication Management: Evaluate patient's response, side effects, and tolerance of medication regimen.  Therapeutic Interventions: 1 to 1 sessions, Unit Group sessions and Medication administration.  Evaluation of Outcomes: Progressing   RN Treatment Plan for Primary Diagnosis: MDD, single episode, severe with psychosis Long Term Goal(s): Knowledge of disease and therapeutic regimen to maintain health will improve  Short Term Goals: Ability to remain free from injury will improve, Ability to disclose and discuss suicidal ideas and Ability to identify and develop effective coping behaviors will improve  Medication Management: RN will administer medications as ordered by provider, will assess and evaluate patient's response and provide education to patient for prescribed medication. RN will report any adverse and/or side effects to prescribing provider.  Therapeutic Interventions: 1 on 1 counseling sessions, Psychoeducation, Medication administration, Evaluate responses to treatment, Monitor vital signs and CBGs as ordered, Perform/monitor CIWA, COWS, AIMS and Fall Risk screenings as ordered, Perform wound care treatments as ordered.  Evaluation of Outcomes: Progressing   LCSW Treatment Plan for Primary Diagnosis: MDD, single episode, severe with psychosis Long Term Goal(s): Safe transition to appropriate next level of care at discharge, Engage patient in therapeutic group addressing interpersonal concerns.  Short Term Goals: Engage  patient in aftercare planning with referrals and resources, Facilitate patient progression through stages of change regarding substance use diagnoses and concerns and Identify triggers associated with mental health/substance abuse issues  Therapeutic Interventions: Assess for all discharge needs, 1 to 1 time with Social worker, Explore available resources and support systems, Assess for adequacy in community support network, Educate family and significant other(s) on suicide prevention, Complete Psychosocial Assessment, Interpersonal group therapy.  Evaluation of Outcomes: Progressing   Progress in Treatment: Attending groups: Yes. Participating in groups: Yes. Taking medication as prescribed: Yes. Toleration medication: Yes. Family/Significant other contact made: No, will contact:  family member if patient consents to collateral contact.  Patient understands diagnosis: Yes. Discussing patient identified problems/goals with staff: Yes. Medical problems stabilized or resolved: Yes  Denies suicidal/homicidal ideation: Yes. Issues/concerns per patient self-inventory: No. Other: n/a   New problem(s) identified: No, Describe:  n/a   New Short Term/Long Term Goal(s): elimination of SI thoughts; development of comprehensive mental wellness/sobriety plan, medication management for mood stabilization.   Discharge Plan or Barriers: CSW assessing for appropriate referrals. Pt plans to return home and will  likely resume [facility] at [facility]. [facility] pamphlet and AA/NA information provided to pt.   Reason for Continuation of Hospitalization: Anxiety Depression Medication stabilization Suicidal ideation Withdrawal symptoms  Estimated Length of Stay: Monday, 11/26/17  Attendees: Patient: 11/23/2017 9:17 AM  Physician: Dr. Altamese [place] MD; Dr. Jama Flavors MD 11/23/2017 9:17 AM  Nursing: Frederich Cha RN 11/23/2017 9:17 AM  RN Care Manager: Ouida Sills CM 11/23/2017 9:17 AM  Social Worker: Trula Slade,  LCSW 11/23/2017 9:17 AM  Recreational Therapist: x 11/23/2017 9:17 AM  Other: Armandina Stammer NP; Feliz Beam Money NP 11/23/2017 9:17 AM  Other:  11/23/2017 9:17 AM  Other: 11/23/2017 9:17 AM    Scribe for Treatment Team: Ledell Peoples Smart, LCSW 11/23/2017 9:17 AM

## 2017-11-23 NOTE — H&P (Signed)
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 Start Abilify 77m23mo qDay             - Start lamictal 277m79m qDay  - Anxiety             - Start atarax 277mg777mq6h prn anxiety  -HLD             - Restart lipitor 10mg 67mhs  -HTN             - Restart Coreg 6.25 BID with meals              - Restart Maxide-25 take 2 tablets daily  - Anemia             - Restart ferrous sulfate tablet 1 tablet qDay with meals  - Insomnia             - Stat trazodone 100mg p41ms prn insomnia  -Encourage participation in groups and the therapeutic milieu  -Discharge planning will be ongoing   ChristoMaris Berger

## 2017-11-23 NOTE — Progress Notes (Signed)
Recreation Therapy Notes  Date:  11/23/17 Time: 0930 Location: 300 Hall Group Room  Group Topic: Stress Management  Goal Area(s) Addresses:  Patient will verbalize importance of using healthy stress management.  Patient will identify positive emotions associated with healthy stress management.   Behavioral Response: Engaged  Intervention: Stress Management  Activity :  Progressive Muscle Relaxation.  LRT introduced the stress management technique of progressive muscle relaxation.  LRT read a script to guide patients through the technique which allowed them to tense and relax each muscle group individually.  Education:  Stress Management, Discharge Planning.   Education Outcome: Acknowledges edcuation/In group clarification offered/Needs additional education  Clinical Observations/Feedback: Pt attended group.     Caroll Rancher, LRT/CTRS     Lillia Abed, Ratasha Fabre A 11/23/2017 10:51 AM

## 2017-11-23 NOTE — [facility] Group Notes (Signed)
LCSW Group Therapy Note  11/23/2017 1:15pm  Type of Therapy/Topic:  Group Therapy:  Emotion Regulation  Participation Level:  Active   Description of Group:   The purpose of this group is to assist patients in learning to regulate negative emotions and experience positive emotions. Patients will be guided to discuss ways in which they have been vulnerable to their negative emotions. These vulnerabilities will be juxtaposed with experiences of positive emotions or situations, and patients will be challenged to use positive emotions to combat negative ones. Special emphasis will be placed on coping with negative emotions in conflict situations, and patients will process healthy conflict resolution skills.  Therapeutic Goals: 1. Patient will identify two positive emotions or experiences to reflect on in order to balance out negative emotions 2. Patient will label two or more emotions that they find the most difficult to experience 3. Patient will demonstrate positive conflict resolution skills through discussion and/or role plays  Summary of Patient Progress:  Julia Wheeler was attentive and engaged during today's processing group. She shared that anxiety is the emotion that she struggles with most. Julia Wheeler was visibly anxious and fidgety during group. She shared that she breaks out into sweats and finds it difficult to think straight. She was receptive to learning about various mindfulness techniques and getting more information about these coping skills. Julia Wheeler shared that her medication likely needed adjustment as well. Julia Wheeler continues to show progress in the group setting with improving insight.   Therapeutic Modalities:   Cognitive Behavioral Therapy Feelings Identification Dialectical Behavioral Therapy   Ledell Peoples Smart, LCSW 11/23/2017 2:23 PM

## 2017-11-23 NOTE — [facility] Suicide Risk Assessment (Signed)
[facility] INPATIENT:  Family/Significant Other Suicide Prevention Education  Suicide Prevention Education:  Patient Refusal for Family/Significant Other Suicide Prevention Education: The patient Julia Wheeler has refused to provide written consent for family/significant other to be provided Family/Significant Other Suicide Prevention Education during admission and/or prior to discharge.  Physician notified.  SPE completed with pt, as pt refused to consent to family contact. SPI pamphlet provided to pt and pt was encouraged to share information with support network, ask questions, and talk about any concerns relating to SPE. Pt denies access to guns/firearms and verbalized understanding of information provided. Mobile Crisis information also provided to pt.   Deondria Puryear N Smart LCSW 11/23/2017, 12:33 PM

## 2017-11-23 NOTE — Tx Team (Signed)
Initial Treatment Plan 11/23/2017 12:41 AM Santo Held DJM:[id number]    PATIENT STRESSORS: Legal issue Medication change or noncompliance   PATIENT STRENGTHS: Ability for insight General fund of knowledge Motivation for treatment/growth Physical Health   PATIENT IDENTIFIED PROBLEMS: "anxiety"  "Depression"  insomnia  Racing thoughts               DISCHARGE CRITERIA:  Ability to meet basic life and health needs Adequate post-discharge living arrangements Improved stabilization in mood, thinking, and/or behavior Motivation to continue treatment in a less acute level of care Need for constant or close observation no longer present Reduction of life-threatening or endangering symptoms to within safe limits Safe-care adequate arrangements made Verbal commitment to aftercare and medication compliance  PRELIMINARY DISCHARGE PLAN: Outpatient therapy Return to previous living arrangement  PATIENT/FAMILY INVOLVEMENT: This treatment plan has been presented to and reviewed with the patient, Julia Wheeler, and/or family member, .  The patient and family have been given the opportunity to ask questions and make suggestions.  Andrena Mews, RN 11/23/2017, 12:41 AM

## 2017-11-24 DIAGNOSIS — R441 Visual hallucinations: Secondary | ICD-10-CM

## 2017-11-24 DIAGNOSIS — F39 Unspecified mood [affective] disorder: Secondary | ICD-10-CM

## 2017-11-24 DIAGNOSIS — Z560 Unemployment, unspecified: Secondary | ICD-10-CM

## 2017-11-24 MED ORDER — LORAZEPAM 1 MG PO TABS
1.0000 mg | ORAL_TABLET | Freq: Four times a day (QID) | ORAL | Status: DC | PRN
  Administered 2017-11-24 – 2017-11-27 (×7): 1 mg via ORAL
  Filled 2017-11-24 (×7): qty 1

## 2017-11-24 MED ORDER — ARIPIPRAZOLE 5 MG PO TABS
5.0000 mg | ORAL_TABLET | Freq: Once | ORAL | Status: AC
  Administered 2017-11-24: 5 mg via ORAL
  Filled 2017-11-24: qty 1

## 2017-11-24 MED ORDER — HALOPERIDOL 5 MG PO TABS
5.0000 mg | ORAL_TABLET | Freq: Four times a day (QID) | ORAL | Status: DC | PRN
  Administered 2017-11-24 – 2017-11-27 (×5): 5 mg via ORAL
  Filled 2017-11-24 (×5): qty 1

## 2017-11-24 MED ORDER — ARIPIPRAZOLE 10 MG PO TABS
10.0000 mg | ORAL_TABLET | Freq: Every day | ORAL | Status: DC
  Administered 2017-11-25 – 2017-11-28 (×4): 10 mg via ORAL
  Filled 2017-11-24 (×7): qty 1

## 2017-11-24 NOTE — Progress Notes (Signed)
Report received from Tamala Ser RN. Patient is currently lying in bed asleep with eyes closed and respirations even and unlabored. Safety maintained on unit with 15 min checks.

## 2017-11-24 NOTE — [facility] Group Notes (Signed)
[facility] LCSW Group Therapy Note  Date/Time:    11/24/2017   9:15 - 10:15 AM  Type of Therapy and Topic:  Group Therapy:  Thought Distortions  Participation Level:  Active   Description of Group:  In this group, patients were introduced to cognitive distortions and discussed how these can negatively affect lives.  This included All or Nothing thinking, Labeling, Focusing on the Negative, the Shoulds, Blaming, Predicting the Future, Overgeneralization, Mind Reading, Catastrophizing, Emotional Reasoning, Personalization, and Jumping to Conclusions.  Patients then used a worksheet to describe an upsetting event in their life and the Automatic Thoughts they had about that event.  They identified the type of cognitive distortion they used and worked with the group to come up with a more realistic thought to refute their original Automatic Thought.  Therapeutic Goals: 1. Patient will be able to identify this exercise of examining their thoughts as a healthy coping mechanism. 2. Patient will identify a problem area in their life and the automatic thoughts they have about that situation. 3. Patient will verbalize the feelings and outcomes typically associated with their thoughts. 4. Patient will think about and discuss the available evidence supporting their thoughts, as well as assumptions they have to make to believe it. 5. Patient will identify evidence or facts that refute their cognitive distortion. 6. Patient will verbalize a more realistic, helpful conclusion.  Summary of Patient Progress:  The patient expressed that she was upset overnight when she once again could not sleep despite being put on an additional medication.  She acknowledged that she has thoughts that no medicine or therapy will ever work to help her sleep.   Therapeutic Modalities Cognitive Behavioral Therapy Dialectical Behavioral Therapy  Ambrose Mantle, LCSW 11/24/2017 12:00PM
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
## 2017-11-24 NOTE — Progress Notes (Signed)
D.  Pt appears worried, guarded on approach, complaint of being unable to stay asleep.  Pt was positive for evening wrap up group, observed in dayroom but no interaction with peers noted.  Pt denies SH/HI but does report verbal and visual hallucinations continued.  A.  Support and encouragement offered, medications given as ordered.  R. Pt remains safe on the unit, will continue to monitor.

## 2017-11-24 NOTE — Progress Notes (Signed)
The patient described her day as having been "okay". She admits to having dealt with hearing voices today. Her goal for tomorrow is to get more rest and to get more relief from the voices that she hears.

## 2017-11-24 NOTE — Progress Notes (Signed)
D:  Patient continues to appear suspicious; she is guarded and cautious. She states, "I don't know how to explain it.  I just feel off."  Patient was started on haldol today for anxiety and agitation.  She continues to report "racing thoughts and people talking around her."  She appears to be responding to internal stimuli.  She is observed sitting in the day room, however, has little interaction with her peers.  She has some thought blocking and seems anxious and frightened at times.  She denies any thoughts of self harm.  A: Continue to monitor medication management and MD orders.  Safety checks continued every 15 minutes per protocol.  Offer support and encouragement as needed.  R: Patient has minimal interaction with staff and peers.

## 2017-11-25 NOTE — [facility] Group Notes (Signed)
[facility] LCSW Group Therapy Note  Date/Time:  11/25/2017 10:00-11:00AM  Type of Therapy and Topic:  Group Therapy:  Healthy and Unhealthy Supports  Participation Level:  Active   Description of Group:  Patients in this group were introduced to the idea of adding a variety of healthy supports to address the various needs in their lives. The picture on the front of Sunday's workbook was used to demonstrate why more supports are needed in every patient's life.  Patients identified and described healthy supports versus unhealthy supports in general, then gave examples of each in their own lives.   They discussed what additional healthy supports could be helpful in their recovery and wellness after discharge in order to prevent future hospitalizations.   An emphasis was placed on using counselor, doctor, therapy groups, 12-step groups, and problem-specific support groups to expand supports.  They also worked as a group on developing a specific plan for several patients to deal with unhealthy supports through boundary-setting, psychoeducation with loved ones, and even termination of relationships.   Therapeutic Goals:   1)  discuss importance of adding supports to stay well once out of the hospital  2)  compare healthy versus unhealthy supports and identify some examples of each  3)  generate ideas and descriptions of healthy supports that can be added  4)  offer mutual support about how to address unhealthy supports  5)  encourage active participation in and adherence to discharge plan    Summary of Patient Progress:  The patient shared that her goal is to take her medications regularly despite any challenges, and was given a variety of options by other patients and CSW.  She also wants to be in couseling.   Therapeutic Modalities:   Motivational Interviewing Brief Solution-Focused Therapy  Ambrose Mantle, LCSW

## 2017-11-25 NOTE — Progress Notes (Signed)
D.   Pt appears flat and guarded on approach, denies complaints at this time.  Pt does continue to appear to be responding to internal stimuli, possible thought blocking.  Pt did not attend evening AA group, remained in room.  Pt came up for night medication shortly before 2100 and then returned to her room.  Pt denies SI/HI at this time.  A.  Support and encouragement offered, medication given as ordered.  R.  Pt remains safe on the unit, will continue to monitor.
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
## 2017-11-25 NOTE — Progress Notes (Signed)
Pt did not attend AA group this evening.  

## 2017-11-25 NOTE — Progress Notes (Signed)
Patient ID: Julia Wheeler, female   DOB: 1965-12-09, 52 y.o.   MRN: [id number]  DAR: Pt. Denies SI/HI and A/V Hallucinations this morning however this afternoon reports some sounds. She reports that she hears music and voices. Patient presents with some thought blocking as well. She received PRN medications today for anxiety and psychosis. She re[prts that she slept fairly last night, she reports that her appetite is poor, her energy level is low, and her concentration is good. She rates her depression level 7/10, her hopelessness level 4/10, and her anxiety level 8/10. Patient does not report any pain or discomfort at this time. Support and encouragement provided to the patient. Scheduled medication administered to patient per physician's orders. Patient reports her goal for today is, "getting my mood up and working on anxiety." Q15 minute checks are maintained for safety.

## 2017-11-25 NOTE — [facility] Group Notes (Signed)
Nursing Education   Date:  11/25/2017  Time:  5:13 PM  Type of Therapy:  Nurse Education  /  The group focuses on teaching patients how to develop their healthy support systems that will help them maintain their mental health.  Participation Level:  Active  Participation Quality:  Attentive  Affect:  Anxious  Cognitive:  Alert  Insight:  Improving  Engagement in Group:  Engaged  Modes of Intervention:  Clarification  Summary of Progress/Problems:  Julia Wheeler, Julia Wheeler 11/25/2017, 5:13 PM

## 2017-11-25 NOTE — Progress Notes (Signed)
Patient ID: Julia Wheeler, female   DOB: 02-Sep-1966, 52 y.o.   MRN: [id number]  Patient presented to writer this morning shortly after receiving her morning medications reporting that she had vomited. Writer was able to see patient's vomitus and noted that there were disintegrated pills in the toilet. NP Reola Calkins is aware and Arts administrator to NOT re-administer medications.

## 2017-11-26 MED ORDER — SERTRALINE HCL 50 MG PO TABS
150.0000 mg | ORAL_TABLET | Freq: Every day | ORAL | Status: DC
  Administered 2017-11-27 – 2017-11-28 (×2): 150 mg via ORAL
  Filled 2017-11-26 (×4): qty 3

## 2017-11-26 MED ORDER — TRAZODONE HCL 150 MG PO TABS
150.0000 mg | ORAL_TABLET | Freq: Every day | ORAL | Status: DC
  Administered 2017-11-26 – 2017-11-27 (×2): 150 mg via ORAL
  Filled 2017-11-26 (×5): qty 1

## 2017-11-26 MED ORDER — LAMOTRIGINE 25 MG PO TABS
50.0000 mg | ORAL_TABLET | Freq: Every day | ORAL | Status: DC
  Administered 2017-11-27 – 2017-11-28 (×2): 50 mg via ORAL
  Filled 2017-11-26 (×4): qty 2

## 2017-11-26 MED ORDER — SERTRALINE HCL 50 MG PO TABS
50.0000 mg | ORAL_TABLET | Freq: Once | ORAL | Status: AC
  Administered 2017-11-26: 50 mg via ORAL
  Filled 2017-11-26 (×2): qty 1

## 2017-11-26 MED ORDER — LAMOTRIGINE 25 MG PO TABS
25.0000 mg | ORAL_TABLET | Freq: Once | ORAL | Status: AC
  Administered 2017-11-26: 25 mg via ORAL
  Filled 2017-11-26: qty 1
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
## 2017-11-26 NOTE — BHH Group Notes (Signed)
Pt attended spiritual care group on grief and loss facilitated by chaplain Burnis Kingfisher   Group opened with brief discussion and psycho-social ed around grief and loss in relationships and in relation to self - identifying life patterns, circumstances, changes that cause losses. Established group norm of speaking from own life experience. Group goal of establishing open and affirming space for members to share loss and experience with grief, normalize grief experience and provide psycho social education and grief support.   Stewart was present throughout group, with exception of being pulled to speak with treatment team at end of group session.  Stated her life had changed significantly since her youngest daughter left for college.  Stated that being a mother and getting kids through school had served as a distraction -- "I was able to focus on others."  Described her brother who lives in [place] as changing the subject or telling a story when she attempts to speak about how she is feeling.  Thus she has felt isolated for a while.  Had a change in therapist and describes not connecting to her new therapist.  Group provided empathic support, encouraged speaking with therapist, provided encouragement for her reaching out in group.

## 2017-11-26 NOTE — Progress Notes (Signed)
D   Pt appears to be responding to internal stimuli and having some mild thought blocking    She has a fearful expression on her face but her verbalizations are appropriate and her behavior is appropriate even though she interacts minimally with others   Pt attended group this evening   She is somewhat confused about her medications and what theruputic use they have A   Verbal  support given    Medications administered and effectiveness monitored    Q 15 min checks   Simple explanations given for use of medications  R   Pt is safe at present time and receptive to verbal support

## 2017-11-26 NOTE — Progress Notes (Signed)
Patient ID: Julia Wheeler, female   DOB: 1966-11-06, 52 y.o.   MRN: [id number]  DAR: Pt. Denies SI/HI and A/V Hallucinations. She reports that her sleep last night was fair, her appetite is poor, her energy level is low, and concentration is good. She rates her depression level is 7/10, her hopelessness is 4/10, and anxiety 8/10. She received Ativan twice today for anxiety. Patient does not report any pain or discomfort at this time. Support and encouragement provided to the patient. Scheduled medications administered to patient per physician's orders. Patient is minimal with Clinical research associate and with peers at this time. She appears preoccupied at times but does not report hallucinations. Q15 minute checks are maintained for safety.

## 2017-11-26 NOTE — B[facility] Group Notes (Signed)
LCSW Group Therapy Note   11/26/2017 1:15pm   Type of Therapy and Topic:  Group Therapy:  Overcoming Obstacles   Participation Level:  Active   Description of Group:    In this group patients will be encouraged to explore what they see as obstacles to their own wellness and recovery. They will be guided to discuss their thoughts, feelings, and behaviors related to these obstacles. The group will process together ways to cope with barriers, with attention given to specific choices patients can make. Each patient will be challenged to identify changes they are motivated to make in order to overcome their obstacles. This group will be process-oriented, with patients participating in exploration of their own experiences as well as giving and receiving support and challenge from other group members.   Therapeutic Goals: 1. Patient will identify personal and current obstacles as they relate to admission. 2. Patient will identify barriers that currently interfere with their wellness or overcoming obstacles.  3. Patient will identify feelings, thought process and behaviors related to these barriers. 4. Patient will identify two changes they are willing to make to overcome these obstacles:      Summary of Patient Progress  Julia Wheeler was attentive and engaged during today's processing group. "My biggest obstacle is giong to be paying for my medication." Julia Wheeler was receptive to following up with Julia Wheeler and talking with them about financial assistance as well as filling out an orange card application. She reports that her anxiety and depression "have not really improved over the past few days." She is working with the MD on changing some medications and plans to stay in the hospital for a few more days. Julia Wheeler continues to show progress in the group setting with improving insight.     Therapeutic Modalities:   Cognitive Behavioral Therapy Solution Focused Therapy Motivational Interviewing Relapse Prevention  Therapy  Ledell Peoples Smart, LCSW 11/26/2017 1:05 PM

## 2017-11-26 NOTE — Progress Notes (Signed)
Recreation Therapy Notes  Date: 11/26/17 Time: 0930 Location: [address] Dayroom  Group Topic: Stress Management  Goal Area(s) Addresses:  Patient will verbalize importance of using healthy stress management.  Patient will identify positive emotions associated with healthy stress management.   Behavioral Response: Engaged  Intervention: Stress Management  Activity :  [employer].  LRT introduced the stress management technique of guided imagery.  LRT read a script to allow patients to visualize being in a protected wildlife sanctuary.  Patients were to follow along as script was read to engage in activity.  Education:  Stress Management, Discharge Planning.   Education Outcome: Acknowledges edcuation/In group clarification offered/Needs additional education  Clinical Observations/Feedback: Pt attended group.     Caroll Rancher, LRT/CTRS         Caroll Rancher A 11/26/2017 12:13 PM

## 2017-11-27 NOTE — BHH Group Notes (Signed)
[facility] Mental Health Association Group Therapy 11/27/2017 1:15pm  Type of Therapy: Mental Health Association Presentation  Participation Level: Active  Participation Quality: Attentive  Affect: Appropriate  Cognitive: Oriented  Insight: Developing/Improving  Engagement in Therapy: Engaged  Modes of Intervention: Discussion, Education and Socialization  Summary of Progress/Problems: Mental Health Association (MHA) Speaker came to talk about his personal journey with mental health. The pt processed ways by which to relate to the speaker. MHA speaker provided handouts and educational information pertaining to groups and services offered by the [facility]. Pt was engaged in speaker's presentation and was receptive to resources provided.    [employer], LCSW 11/27/2017 2:07 PM

## 2017-11-27 NOTE — Progress Notes (Signed)
Patient ID: Julia Wheeler, female   DOB: 08/28/1966, 52 y.o.   MRN: [id number] D) Pt affect has been flat, blank. Mood depressed, anxious. Pt positive for groups. Pt has been out in the milieu however does not interact with peers or make eye contact. Pt approached Clinical research associate about med ed asking about sleep medication and anti-anxiety medication. Pt c/o "agitation". Denies avh or paranoia. A) Level 3 obs for safety, support and encouragement provided. Med ed reinforced. PRN medication provided for anxiety, agitation. R) Cooperative.

## 2017-11-27 NOTE — Progress Notes (Signed)
Adult Psychoeducational Group Note  Date:  11/27/2017 Time:  12:03 AM  Group Topic/Focus:  Wrap-Up Group:   The focus of this group is to help patients review their daily goal of treatment and discuss progress on daily workbooks.  Participation Level:  Active  Participation Quality:  Appropriate  Affect:  Appropriate  Cognitive:  Appropriate  Insight: Appropriate  Engagement in Group:  Engaged  Modes of Intervention:  Discussion  Additional Comments:  Pt stated her goal for today was to talk with doctor about her medication issues. Pt stated she was able to accomplished her goal and hopes to gain a full nights rest tonight. Pt rated her over day a 6 out of 10. Pt stated she attend all groups held today.  Julia Wheeler 11/27/2017, 12:03 AM

## 2017-11-27 NOTE — Progress Notes (Signed)
D   Pt appears to be responding to internal stimuli and having some mild thought blocking    She has a fearful expression on her face but not as fearful looking as she was yesterday   her verbalizations are appropriate and her behavior is appropriate even though she interacts minimally with others   Pt attended group this evening   She is somewhat confused about her medications and what theruputic use they have A   Verbal  support given    Medications administered and effectiveness monitored    Q 15 min checks   Simple explanations given for use of medications  R   Pt is safe at present time and receptive to verbal support

## 2017-11-27 NOTE — Progress Notes (Signed)
D:Pt has a flat/sad affect with minimal interaction. She reports that her depression has not lifted since admission. She rates depression as a 7 and anxiety as an 8 on 0-10 scale with 10 being the most. Pt c/o constipation with last BM two days ago.  A:Offered support and 15 minute checks. Gave PRN medication for constipation.  R:Pt verbally denies si and hi. She denies hallucinations. Safety maintained on the unit.
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
## 2017-11-28 ENCOUNTER — Other Ambulatory Visit: Payer: Self-pay

## 2017-11-28 ENCOUNTER — Encounter: Payer: Self-pay | Admitting: *Deleted

## 2017-11-28 ENCOUNTER — Inpatient Hospital Stay
Admission: AD | Admit: 2017-11-28 | Discharge: 2017-12-18 | DRG: 885 | Disposition: A | Discharge status: Home / Self Care | Payer: No Typology Code available for payment source | Source: Intra-hospital | Attending: Psychiatry | Admitting: Psychiatry

## 2017-11-28 DIAGNOSIS — R634 Abnormal weight loss: Secondary | ICD-10-CM | POA: Diagnosis present

## 2017-11-28 DIAGNOSIS — I10 Essential (primary) hypertension: Secondary | ICD-10-CM | POA: Diagnosis present

## 2017-11-28 DIAGNOSIS — Z818 Family history of other mental and behavioral disorders: Secondary | ICD-10-CM | POA: Diagnosis not present

## 2017-11-28 DIAGNOSIS — G4700 Insomnia, unspecified: Secondary | ICD-10-CM | POA: Diagnosis present

## 2017-11-28 DIAGNOSIS — I110 Hypertensive heart disease with heart failure: Secondary | ICD-10-CM | POA: Diagnosis present

## 2017-11-28 DIAGNOSIS — D573 Sickle-cell trait: Secondary | ICD-10-CM | POA: Diagnosis present

## 2017-11-28 DIAGNOSIS — T50902A Poisoning by unspecified drugs, medicaments and biological substances, intentional self-harm, initial encounter: Secondary | ICD-10-CM | POA: Diagnosis present

## 2017-11-28 DIAGNOSIS — Z91410 Personal history of adult physical and sexual abuse: Secondary | ICD-10-CM | POA: Diagnosis not present

## 2017-11-28 DIAGNOSIS — F333 Major depressive disorder, recurrent, severe with psychotic symptoms: Principal | ICD-10-CM | POA: Diagnosis present

## 2017-11-28 DIAGNOSIS — K219 Gastro-esophageal reflux disease without esophagitis: Secondary | ICD-10-CM | POA: Diagnosis present

## 2017-11-28 DIAGNOSIS — R112 Nausea with vomiting, unspecified: Secondary | ICD-10-CM | POA: Diagnosis present

## 2017-11-28 DIAGNOSIS — Z885 Allergy status to narcotic agent status: Secondary | ICD-10-CM

## 2017-11-28 DIAGNOSIS — Z888 Allergy status to other drugs, medicaments and biological substances status: Secondary | ICD-10-CM

## 2017-11-28 DIAGNOSIS — Z91030 Bee allergy status: Secondary | ICD-10-CM | POA: Diagnosis not present

## 2017-11-28 DIAGNOSIS — Z9884 Bariatric surgery status: Secondary | ICD-10-CM | POA: Diagnosis not present

## 2017-11-28 DIAGNOSIS — E785 Hyperlipidemia, unspecified: Secondary | ICD-10-CM | POA: Diagnosis present

## 2017-11-28 DIAGNOSIS — F4310 Post-traumatic stress disorder, unspecified: Secondary | ICD-10-CM | POA: Diagnosis present

## 2017-11-28 DIAGNOSIS — F1020 Alcohol dependence, uncomplicated: Secondary | ICD-10-CM | POA: Diagnosis present

## 2017-11-28 DIAGNOSIS — Z79899 Other long term (current) drug therapy: Secondary | ICD-10-CM

## 2017-11-28 DIAGNOSIS — Z62810 Personal history of physical and sexual abuse in childhood: Secondary | ICD-10-CM | POA: Diagnosis not present

## 2017-11-28 DIAGNOSIS — R440 Auditory hallucinations: Secondary | ICD-10-CM | POA: Diagnosis not present

## 2017-11-28 DIAGNOSIS — I5022 Chronic systolic (congestive) heart failure: Secondary | ICD-10-CM | POA: Diagnosis present

## 2017-11-28 DIAGNOSIS — R109 Unspecified abdominal pain: Secondary | ICD-10-CM

## 2017-11-28 MED ORDER — ATORVASTATIN CALCIUM 10 MG PO TABS: 10.0000 mg | ORAL_TABLET | Freq: Every day | ORAL | Status: DC

## 2017-11-28 MED ORDER — SERTRALINE HCL 25 MG PO TABS
150.0000 mg | ORAL_TABLET | Freq: Every day | ORAL | Status: DC
  Administered 2017-11-29 – 2017-11-30 (×2): 150 mg via ORAL
  Filled 2017-11-28 (×2): qty 2

## 2017-11-28 MED ORDER — ATORVASTATIN CALCIUM 20 MG PO TABS
10.0000 mg | ORAL_TABLET | Freq: Every day | ORAL | Status: DC
  Administered 2017-11-28 – 2017-12-17 (×20): 10 mg via ORAL
  Filled 2017-11-28 (×21): qty 1

## 2017-11-28 MED ORDER — LAMOTRIGINE 25 MG PO TABS: 50.0000 mg | ORAL_TABLET | Freq: Every day | ORAL | Status: DC

## 2017-11-28 MED ORDER — CARVEDILOL 6.25 MG PO TABS: 6.2500 mg | ORAL_TABLET | Freq: Two times a day (BID) | ORAL | Status: DC

## 2017-11-28 MED ORDER — TRIAMTERENE-HCTZ 37.5-25 MG PO TABS: 2.0000 | ORAL_TABLET | Freq: Every day | ORAL | Status: DC

## 2017-11-28 MED ORDER — CARVEDILOL 6.25 MG PO TABS
6.2500 mg | ORAL_TABLET | Freq: Two times a day (BID) | ORAL | Status: DC
  Administered 2017-11-29 – 2017-12-18 (×29): 6.25 mg via ORAL
  Filled 2017-11-28 (×31): qty 1

## 2017-11-28 MED ORDER — TRAZODONE HCL 50 MG PO TABS
150.0000 mg | ORAL_TABLET | Freq: Every day | ORAL | Status: DC
  Administered 2017-11-28 – 2017-12-17 (×20): 150 mg via ORAL
  Filled 2017-11-28 (×21): qty 1

## 2017-11-28 MED ORDER — MAGNESIUM HYDROXIDE 400 MG/5ML PO SUSP: 30.0000 mL | Freq: Every day | ORAL | Status: DC | PRN

## 2017-11-28 MED ORDER — ARIPIPRAZOLE 10 MG PO TABS: 10.0000 mg | ORAL_TABLET | Freq: Every day | ORAL | Status: DC

## 2017-11-28 MED ORDER — LORAZEPAM 1 MG PO TABS: 1.0000 mg | ORAL_TABLET | Freq: Four times a day (QID) | ORAL | 0 refills | Status: DC | PRN

## 2017-11-28 MED ORDER — TRAZODONE HCL 150 MG PO TABS: 150.0000 mg | ORAL_TABLET | Freq: Every day | ORAL | Status: DC

## 2017-11-28 MED ORDER — CETIRIZINE HCL 10 MG PO TABS: 10.0000 mg | ORAL_TABLET | ORAL | 2 refills | Status: DC | PRN

## 2017-11-28 MED ORDER — HYDROXYZINE HCL 50 MG PO TABS: 50.0000 mg | ORAL_TABLET | Freq: Four times a day (QID) | ORAL | 0 refills | Status: DC | PRN

## 2017-11-28 MED ORDER — LAMOTRIGINE 25 MG PO TABS
50.0000 mg | ORAL_TABLET | Freq: Every day | ORAL | Status: DC
  Administered 2017-11-29: 50 mg via ORAL
  Filled 2017-11-28: qty 2

## 2017-11-28 MED ORDER — ALUM & MAG HYDROXIDE-SIMETH 200-200-20 MG/5ML PO SUSP
30.0000 mL | ORAL | Status: DC | PRN
  Administered 2017-12-04 – 2017-12-06 (×2): 30 mL via ORAL
  Filled 2017-11-28 (×2): qty 30

## 2017-11-28 MED ORDER — HYDROXYZINE HCL 50 MG PO TABS
50.0000 mg | ORAL_TABLET | Freq: Four times a day (QID) | ORAL | Status: DC | PRN
  Administered 2017-11-28 – 2017-12-08 (×5): 50 mg via ORAL
  Filled 2017-11-28 (×5): qty 1

## 2017-11-28 MED ORDER — MAGNESIUM HYDROXIDE 400 MG/5ML PO SUSP
30.0000 mL | Freq: Every day | ORAL | Status: DC | PRN
  Administered 2017-12-01: 30 mL via ORAL
  Filled 2017-11-28: qty 30

## 2017-11-28 MED ORDER — MAGNESIUM HYDROXIDE 400 MG/5ML PO SUSP: 30.0000 mL | Freq: Every day | ORAL | 0 refills | Status: DC | PRN

## 2017-11-28 MED ORDER — ACETAMINOPHEN 325 MG PO TABS: 650.0000 mg | ORAL_TABLET | Freq: Four times a day (QID) | ORAL | Status: DC | PRN

## 2017-11-28 MED ORDER — ALUM & MAG HYDROXIDE-SIMETH 200-200-20 MG/5ML PO SUSP: 30.0000 mL | ORAL | Status: DC | PRN

## 2017-11-28 MED ORDER — FERROUS SULFATE 325 (65 FE) MG PO TABS: 325.0000 mg | ORAL_TABLET | Freq: Every day | ORAL | 1 refills | Status: DC

## 2017-11-28 MED ORDER — TRIAMTERENE-HCTZ 37.5-25 MG PO TABS
2.0000 | ORAL_TABLET | Freq: Every day | ORAL | Status: DC
  Administered 2017-11-29 – 2017-12-18 (×16): 2 via ORAL
  Filled 2017-11-28 (×20): qty 2

## 2017-11-28 MED ORDER — ACETAMINOPHEN 325 MG PO TABS
650.0000 mg | ORAL_TABLET | Freq: Four times a day (QID) | ORAL | Status: DC | PRN
  Administered 2017-11-30 – 2017-12-14 (×6): 650 mg via ORAL
  Filled 2017-11-28 (×6): qty 2

## 2017-11-28 MED ORDER — HALOPERIDOL 5 MG PO TABS: 5.0000 mg | ORAL_TABLET | Freq: Four times a day (QID) | ORAL | Status: DC | PRN

## 2017-11-28 MED ORDER — ARIPIPRAZOLE 10 MG PO TABS
10.0000 mg | ORAL_TABLET | Freq: Every day | ORAL | Status: DC
  Administered 2017-11-29 – 2017-12-18 (×20): 10 mg via ORAL
  Filled 2017-11-28 (×21): qty 1

## 2017-11-28 MED ORDER — LORAZEPAM 1 MG PO TABS: 1.0000 mg | ORAL_TABLET | Freq: Four times a day (QID) | ORAL | Status: DC | PRN

## 2017-11-28 MED ORDER — SERTRALINE HCL 50 MG PO TABS: 150.0000 mg | ORAL_TABLET | Freq: Every day | ORAL | Status: DC

## 2017-11-28 MED ORDER — ALUM & MAG HYDROXIDE-SIMETH 200-200-20 MG/5ML PO SUSP: 30.0000 mL | ORAL | 0 refills | Status: DC | PRN

## 2017-11-28 MED ORDER — FERROUS SULFATE 325 (65 FE) MG PO TABS
325.0000 mg | ORAL_TABLET | Freq: Every day | ORAL | Status: DC
  Administered 2017-11-29 – 2017-12-18 (×20): 325 mg via ORAL
  Filled 2017-11-28 (×21): qty 1

## 2017-11-28 MED ORDER — LORATADINE 10 MG PO TABS
10.0000 mg | ORAL_TABLET | Freq: Every day | ORAL | Status: DC
  Administered 2017-11-29 – 2017-12-18 (×20): 10 mg via ORAL
  Filled 2017-11-28 (×21): qty 1

## 2017-11-28 NOTE — Progress Notes (Signed)
Per Armandina Stammer NP, pt has agreed to referral for ECT with Dr. Toni Amend. CSW called [phone] to make referral and provided pt name and MRN for Dr. Toni Amend to review chart, noting that pt is uninsured.   Trula Slade, MSW, LCSW Clinical Social Worker 11/28/2017 2:42 PM

## 2017-11-28 NOTE — Progress Notes (Signed)
Adult Psychoeducational Group Note  Date:  11/28/2017 Time:  1:29 AM  Group Topic/Focus:  Wrap-Up Group:   The focus of this group is to help patients review their daily goal of treatment and discuss progress on daily workbooks.  Participation Level:  Active  Participation Quality:  Appropriate  Affect:  Appropriate  Cognitive:  Appropriate  Insight: Appropriate  Engagement in Group:  Engaged  Modes of Intervention:  Discussion  Additional Comments:  Pt stated her goal for today was to talk with doctor about her night time medication. Pt stated she accomplished her goal and hopes tonight she will be able to sleep better with the new changes. Pt rated her over day a 5 out of 10. Pt stated she attend all groups held today.  Felipa Furnace 11/28/2017, 1:29 AM

## 2017-11-28 NOTE — Progress Notes (Signed)
Pt discharge from [facility] to [facility] via [place] transportation. Writer belongings and d/c paperwork obtained by driver. Pt alert and ambulatory at time of discharge.

## 2017-11-28 NOTE — BH Assessment (Signed)
Patient has been accepted to [facility].  Accepting physician is Dr. Toni Amend.  Attending Physician will be Dr. Toni Amend.  Patient has been assigned to room 320, by [name] Charge Nurse [place] F.  Call report to [phone].  Representative/Transfer Coordinator is Mayer Masker Patient pre-admitted by [facility] Patient Access Mertie Clause)   [facility] Staff Carney Bern, TTS) made aware of acceptance.

## 2017-11-28 NOTE — Tx Team (Signed)
Initial Treatment Plan 11/28/2017 7:09 PM [name] JKK:[id number]    PATIENT STRESSORS: Financial difficulties Medication change or noncompliance   PATIENT STRENGTHS: Ability for insight Motivation for treatment/growth Supportive family/friends   PATIENT IDENTIFIED PROBLEMS: "improved mood"  Develop coping skills                   DISCHARGE CRITERIA:  Improved stabilization in mood, thinking, and/or behavior  PRELIMINARY DISCHARGE PLAN: Outpatient therapy Return to previous living arrangement  PATIENT/FAMILY INVOLVEMENT: This treatment plan has been presented to and reviewed with the patient, Julia Wheeler, and/or family member, .  The patient and family have been given the opportunity to ask questions and make suggestions.  Elige Radon, RN 11/28/2017, 7:09 PM
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
## 2017-11-28 NOTE — BHH Group Notes (Signed)
Daily Orientation  Date:  11/28/2017  Time:  8:34 AM  Type of Therapy:  Nurse Education  /  The group focuses on orienting patients to the daily programming and schedule, explaining flow of the unit and establishing therapeutic alliance with the patients.  Participation Level:  Minimal  Participation Quality:  Attentive  Affect:  Anxious and Depressed  Cognitive:  Alert  Insight:  Good  Engagement in Group:  Engaged  Modes of Intervention:  Education  Summary of Progress/Problems:  Julia Wheeler, Julia Wheeler 11/28/2017, 8:34 AM

## 2017-11-28 NOTE — Progress Notes (Signed)
Admission note: 52 year old female admitted with MDD.  Received on unit dress well.  Affect flat.  Soft spoken.  Reports feeling helpless and hopeless. Verbalizes that wants to get her depression under control.  Denies AVH at this time.  Denies thoughts of SI. Body search and skin assessment performed.  No contraband found. Skin dry and intact. Patient oriented to unit and to her room.

## 2017-11-28 NOTE — Plan of Care (Signed)
  Progressing Education: Knowledge of [facility] Education information/materials will improve 11/28/2017 2024 - Progressing by Lelan Pons, RN Coping: Ability to cope will improve 11/28/2017 2024 - Progressing by Lelan Pons, RN Medication: Compliance with prescribed medication regimen will improve 11/28/2017 2024 - Progressing by Lelan Pons, RN Self-Concept: Ability to disclose and discuss suicidal ideas will improve 11/28/2017 2024 - Progressing by Lelan Pons, RN Ability to verbalize positive feelings about self will improve 11/28/2017 2024 - Progressing by Lelan Pons, RN

## 2017-11-28 NOTE — BHH Suicide Risk Assessment (Signed)
[facility] Discharge Suicide Risk Assessment   Principal Problem: MDD (major depressive disorder), recurrent, severe, with psychosis (HCC) Discharge Diagnoses:  Patient Active Problem List   Diagnosis Date Noted  . MDD (major depressive disorder), single episode, severe with psychosis (HCC) [F32.3] 11/22/2017  . Hyperactive small intestine s/p Dx laparoscopy 11/11/2017 [K59.9] 11/12/2017  . MDD (major depressive disorder), recurrent severe, without psychosis (HCC) [F33.2] 11/12/2017  . Depression with anxiety [F41.8] 11/11/2017  . GERD (gastroesophageal reflux disease) [K21.9] 11/11/2017  . Alcohol abuse [F10.10] 11/11/2017  . Diverticulosis [K57.90] 05/24/2017  . Internal hemorrhoids [K64.8] 05/24/2017  . Rectal bleeding [K62.5]   . Abnormal abdominal CT scan [R93.5]   . Gastric bypass status for obesity [Z98.84]   . Esophageal dysphagia [R13.10]   . Symptomatic anemia [D64.9] 05/22/2017  . Chronic left systolic heart failure (HCC) [I50.22] 06/26/2016  . Weight loss [R63.4] 05/04/2016  . B12 deficiency [E53.8] 05/04/2016  . MDD (major depressive disorder), recurrent, severe, with psychosis (HCC) [F33.3] 08/26/2015  . PTSD (post-traumatic stress disorder) [F43.10] 08/26/2015  . Alcohol use disorder, moderate, dependence (HCC) [F10.20] 08/26/2015  . Head trauma [S09.90XA] 08/26/2015  . AP (abdominal pain) [R10.9]   . Abdominal pain [R10.9] 12/27/2014  . Enteritis [K52.9] 12/27/2014  . Nausea with vomiting [R11.2] 12/27/2014  . Hyponatremia [E87.1] 12/27/2014  . Essential hypertension [I10] 12/27/2014  . Depression [F32.9] 12/27/2014  . Anemia, iron deficiency [D50.9] 12/27/2014  . Sickle cell trait (HCC) [D57.3]   . Iron deficiency anemia [D50.9]   . UGI bleed [K92.2] 05/18/2014  . Abdominal pain, left upper quadrant [R10.12] 05/18/2014  . Intussusception of intestine - physiologic & self-resolved [K56.1] 05/17/2014    Total Time spent with patient: 30  minutes  Musculoskeletal: Strength & Muscle Tone: within normal limits Gait & Station: normal Patient leans: N/A  Psychiatric Specialty Exam: Review of Systems  Constitutional: Negative for chills and fever.  Respiratory: Negative for cough and shortness of breath.   Cardiovascular: Negative for chest pain.  Gastrointestinal: Negative for abdominal pain, heartburn, nausea and vomiting.  Psychiatric/Behavioral: Positive for depression. Negative for hallucinations and suicidal ideas. The patient is nervous/anxious.     Blood pressure 90/72, pulse (!) 130, temperature 98.7 F (37.1 C), temperature source Oral, resp. rate 16, height 5' 7.5" (1.715 m), weight 74.4 kg (164 lb), last menstrual period 08/28/2015.Body mass index is 25.31 kg/m.  General Appearance: Casual and Fairly Groomed  Eye Contact::  Good  Speech:  Clear and Coherent and Normal Rate409  Volume:  Normal  Mood:  Anxious and Depressed  Affect:  Congruent, Constricted and Tearful  Thought Process:  Coherent and Goal Directed  Orientation:  Full (Time, Place, and Person)  Thought Content:  Logical  Suicidal Thoughts:  No  Homicidal Thoughts:  No  Memory:  Immediate;   Fair Recent;   Fair Remote;   Fair  Judgement:  Fair  Insight:  Fair  Psychomotor Activity:  Decreased  Concentration:  Fair  Recall:  [employer] of Knowledge:Fair  Language: Fair  Akathisia:  No  Handed:    AIMS (if indicated):     Assets:  Manufacturing systems engineer Physical Health Resilience Social Support  Sleep:  Number of Hours: 6.75  Cognition: WNL  ADL's:  Intact   Mental Status Per Nursing Assessment::   On Admission:  Suicidal ideation indicated by patient  Demographic Factors:  Low socioeconomic status  Loss Factors: Financial problems/change in socioeconomic status  Historical Factors: NA  Risk Reduction Factors:   Living with  another person, especially a relative, Positive social support, Positive therapeutic relationship and  Positive coping skills or problem solving skills  Continued Clinical Symptoms:  Depression:   Severe  Cognitive Features That Contribute To Risk:  None    Suicide Risk:  Moderate:  Frequent suicidal ideation with limited intensity, and duration, some specificity in terms of plans, no associated intent, good self-control, limited dysphoria/symptomatology, some risk factors present, and identifiable protective factors, including available and accessible social support.  Follow-up Information    [facility] Follow up on 12/04/2017.   Specialty:  Behavioral Health Why:  Hospital follow-up on Tues. 12/04/17 at 8:00AM. Please bring hospital discharge paperwork to this appt. Thank you.  Contact information: [address] [place] [address] [phone]        [facility] Follow up.   Why:  You have been accepted for ECT treatment with Dr. Toni Amend. Thank you.  Contact information: [address] [place] [address]        Subjective Data:  Julia Wheeler is a 52 y/o F with history of MDD and PTSD who was admitted voluntarily with worsening depression, SI, and AH which command her to kill herself. Pt had presented to the ED and reported worsening depression. Pt has recent relevant history of suicide attempt via overdose of alcohol and prescription medications in late December 2018. Pt had been evaluated and discharged, but she continued to have worsening mood symptoms, so she asked her son to bring her to the ED again, and she was transferred to [facility] for further treatment and stabilization. During her stay she was restarted on zoloft and she was also started on lamictal and abilify. She had improvement of her AH, but her symptoms of depression have been continuing during her stay.   Today upon evaluation, pt states she feels "the same." She denies SI/HI/AH/VH, but she is withdrawn, psychomotor retarded, and she appears depressed and anxious. Her sleep  is poor and her appetite is adequate but reduced. She is unsure if her medications have been helpful.  Discussed with patient about alternative treatment option of ECT. Discussed the methodology behind ECT and included discussion of the risks, benefits, and alternatives. Pt expressed interest in ECT, and she was in agreement to transfer to [facility] to start course of ECT. Referral was placed, and pt was accepted by Dr. Toni Amend for ECT at [facility]. Pt was informed she would be continued on current regimen and transferred to [facility] for additional treatment with ECT, and she verbalized good understanding. Pt was able to engage in safety planning for transfer to [facility], and she had no additional questions, comments, or concerns.   Plan Of Care/Follow-up recommendations:   - Transfer to [facility] for treatment with ECT (accepted by Dr. Toni Amend)  Mood control     - Continue Abilify 10 mg PO Daily.  Agitation.     - Continue Haldol 5 mg PO Q6H PRN.  Anxiety    - Continue Ativan 1 mg PO Q6H PRN.  Insomnia.    - Continue Trazodone 150 mg PO QHS routinely.  Depression.    - Continue Zoloft 150 mg PO daily.  Mood stabilization    - Continue Lamictal 50 mg PO daily.     - Encourage group therapy participation.    - SW to continue work on the discharge disposition.  Activity:  as tolerated Diet:  normal Tests:  NA Other:  see above for DC plan  Cristal Deer T  Altamese Leawood, MD 11/28/2017, 4:12 PM

## 2017-11-28 NOTE — Consult Note (Signed)
ECT: Consult was received.  Chart reviewed.  Reviewed situation with on site TTS.  Patient would be a appropriate patient for ECT if she is agreeable to the treatment.  I am told that she has no insurance which would mean we could only do treatment while she is in the hospital but at this point the notes seem to indicate that she is appropriate for continued inpatient treatment.  Patient can be transferred to our unit as soon as possible.  If she can get here tomorrow before the end of the day we could potentially start treatment on Friday.  I will try to put in discharge and readmission orders if possible otherwise I can put in orders when she arrives at [facility] behavioral health unit.

## 2017-11-28 NOTE — [facility] Group Notes (Signed)
LCSW Group Therapy Note  11/28/2017 1:15pm  Type of Therapy/Topic:  Group Therapy:  Feelings about Diagnosis  Participation Level:  Minimal   Description of Group:   This group will allow patients to explore their thoughts and feelings about diagnoses they have received. Patients will be guided to explore their level of understanding and acceptance of these diagnoses. Facilitator will encourage patients to process their thoughts and feelings about the reactions of others to their diagnosis and will guide patients in identifying ways to discuss their diagnosis with significant others in their lives. This group will be process-oriented, with patients participating in exploration of their own experiences, giving and receiving support, and processing challenge from other group members.   Therapeutic Goals: 1. Patient will demonstrate understanding of diagnosis as evidenced by identifying two or more symptoms of the disorder 2. Patient will be able to express two feelings regarding the diagnosis 3. Patient will demonstrate their ability to communicate their needs through discussion and/or role play  Summary of Patient Progress:   Shaquita was attentive during group but did not actively participate in group discussion. She continues to present with depressed mood and anxious affect. Madeley does not appear to be making progress in the group setting at this time.     Therapeutic Modalities:   Cognitive Behavioral Therapy Brief Therapy Feelings Identification    Ledell Peoples Smart, LCSW 11/28/2017 1:21 PM

## 2017-11-28 NOTE — Progress Notes (Signed)
  [facility] Adult Case Management Discharge Plan :  Will you be returning to the same living situation after discharge:  No.Pt accepted to [facility] for ECT treatment.  At discharge, do you have transportation home?: Yes,  [place] Do you have the ability to pay for your medications: Yes,  mental health  Release of information consent forms completed and submitted to medical records by CSW.  Patient to Follow up at: Follow-up Information    [facility] Follow up on 12/04/2017.   Specialty:  Behavioral Health Why:  Hospital follow-up on Tues. 12/04/17 at 8:00AM. Please bring hospital discharge paperwork to this appt. Thank you.  Contact information: [address] [place] [address] [phone]        [facility] Follow up.   Why:  You have been accepted for ECT treatment with Dr. Toni Amend. Thank you.  Contact information: [address] [place] [address]          Next level of care provider has access to [facility] Link:no  Safety Planning and Suicide Prevention discussed: Yes,  SPE completed with pt; pt declined to consent to family contact.   Have you used any form of tobacco in the last 30 days? (Cigarettes, Smokeless Tobacco, Cigars, and/or Pipes): No  Has patient been referred to the Quitline?: N/A patient is not a smoker  Patient has been referred for addiction treatment: Yes  [employer], LCSW 11/28/2017, 4:08 PM

## 2017-11-28 NOTE — Progress Notes (Signed)
Report called to Jamesetta So, RN., accepting pt Julia Wheeler going to room 320. Pt will be transported by [place].

## 2017-11-28 NOTE — Progress Notes (Signed)
Recreation Therapy Notes  Date: 11/28/17 Time: 0930 Location: 300 Hall Dayroom  Group Topic: Stress Management  Goal Area(s) Addresses:  Patient will verbalize importance of using healthy stress management.  Patient will identify positive emotions associated with healthy stress management.   Intervention: Stress Management  Activity :  Meditation.  LRT introduced the stress management group of meditation.  LRT played a meditation from the Calm app to participate in the meditation.  Patients were to listen and follow along with the meditation in order to engage in the activity.  Education:  Stress Management, Discharge Planning.   Education Outcome: Acknowledges edcuation/In group clarification offered/Needs additional education  Clinical Observations/Feedback: Pt did not attend group.    Caroll Rancher, LRT/CTRS         Caroll Rancher A 11/28/2017 12:36 PM
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
## 2017-11-28 NOTE — Progress Notes (Signed)
D: Patient reports she is sleeping better.  Her thought process appears more logical.  She appears to have some improvement over last week.  She continues to report "feeling out of sorts."  Patient states, "I only hear the chatter at night."  She denies any thoughts of self harm.  Patient has intense stare and appears to have some thought blocking.  She rates her depression as a 6; hopelessness as a 4; anxiety as an 8.  A: Continue to monitor medication management and MD orders.  Safety checks continued every 15 minutes per protocol.  Offer support and encouragement as needed.    R: Patient remains somewhat withdrawn and isolative.  She has little interaction with staff and peers.

## 2017-11-28 NOTE — Tx Team (Signed)
Interdisciplinary Treatment and Diagnostic Plan Update  11/28/2017 Time of Session: 0830AM Julia Wheeler MRN: [id number]  Principal Diagnosis: MDD, single episode, severe with psychosis  Secondary Diagnoses: Principal Problem:   MDD (major depressive disorder), recurrent, severe, with psychosis (HCC) Active Problems:   PTSD (post-traumatic stress disorder)   Current Medications:  Current Facility-Administered Medications  Medication Dose Route Frequency Provider Last Rate Last Dose  . acetaminophen (TYLENOL) tablet 650 mg  650 mg Oral Q6H PRN Rankin, Shuvon B, NP      . alum & mag hydroxide-simeth (MAALOX/MYLANTA) 200-200-20 MG/5ML suspension 30 mL  30 mL Oral Q4H PRN Rankin, Shuvon B, NP      . ARIPiprazole (ABILIFY) tablet 10 mg  10 mg Oral Daily Money, Travis B, FNP   10 mg at 11/28/17 0819  . atorvastatin (LIPITOR) tablet 10 mg  10 mg Oral QHS Rankin, Shuvon B, NP   10 mg at 11/27/17 2208  . carvedilol (COREG) tablet 6.25 mg  6.25 mg Oral BID WC Rankin, Shuvon B, NP   6.25 mg at 11/28/17 0820  . ferrous sulfate tablet   Oral Daily Armandina Stammer I, NP   325 mg at 11/28/17 0820  . haloperidol (HALDOL) tablet 5 mg  5 mg Oral Q6H PRN Money, Gerlene Burdock, FNP   5 mg at 11/27/17 1656  . hydrOXYzine (ATARAX/VISTARIL) tablet 50 mg  50 mg Oral Q6H PRN Micheal Likens, MD   50 mg at 11/25/17 1516  . lamoTRIgine (LAMICTAL) tablet 50 mg  50 mg Oral Daily Armandina Stammer I, NP   50 mg at 11/28/17 0819  . loratadine (CLARITIN) tablet 10 mg  10 mg Oral Daily Rankin, Shuvon B, NP   10 mg at 11/28/17 0820  . LORazepam (ATIVAN) tablet 1 mg  1 mg Oral Q6H PRN Money, Gerlene Burdock, FNP   1 mg at 11/27/17 2208  . magnesium hydroxide (MILK OF MAGNESIA) suspension 30 mL  30 mL Oral Daily PRN Rankin, Shuvon B, NP   30 mL at 11/27/17 0822  . sertraline (ZOLOFT) tablet 150 mg  150 mg Oral Daily Armandina Stammer I, NP   150 mg at 11/28/17 0818  . traZODone (DESYREL) tablet 150 mg  150 mg Oral QHS Armandina Stammer I, NP    150 mg at 11/27/17 2208  . triamterene-hydrochlorothiazide (MAXZIDE-25) 37.5-25 MG per tablet 2 tablet  2 tablet Oral Daily Rankin, Shuvon B, NP   2 tablet at 11/28/17 4098   PTA Medications: Medications Prior to Admission  Medication Sig Dispense Refill Last Dose  . atorvastatin (LIPITOR) 10 MG tablet Take 1 tablet (10 mg total) by mouth daily. (Patient taking differently: Take 10 mg by mouth at bedtime. )  3 Past Week at Unknown time  . b complex vitamins capsule Take 1 capsule by mouth daily. (Patient not taking: Reported on 11/10/2017) 30 capsule 2 Not Taking at Unknown time  . carvedilol (COREG) 6.25 MG tablet Take 1 tablet (6.25 mg total) by mouth 2 (two) times daily. 180 tablet 3 Past Week at Unknown time  . cetirizine (ZYRTEC) 10 MG tablet Take 10 mg by mouth as needed for allergies.   2 Past Week at Unknown time  . clonazePAM (KLONOPIN) 0.5 MG tablet Take 0.5 mg by mouth 2 (two) times daily.    Past Week at Unknown time  . cyclobenzaprine (FLEXERIL) 10 MG tablet Take 10 mg by mouth 2 (two) times daily.    Past Week at Unknown time  . DULoxetine (  CYMBALTA) 30 MG capsule Take 90 mg by mouth daily.    Past Week at Unknown time  . feeding supplement, ENSURE ENLIVE, (ENSURE ENLIVE) LIQD Take 237 mLs by mouth 2 (two) times daily between meals. (Patient not taking: Reported on 11/10/2017) 30 Bottle 0 Not Taking at Unknown time  . IRON PO Take 1 tablet by mouth daily.    Past Week at Unknown time  . Oxycodone HCl 10 MG TABS Take 1 TABLET BY MOUTH EVERY TWELVE HOURS  0 Past Week at Unknown time  . pantoprazole (PROTONIX) 40 MG tablet Take 1 tablet (40 mg total) by mouth 2 (two) times daily before a meal. 60 tablet 0 Past Week at Unknown time  . sertraline (ZOLOFT) 100 MG tablet Take 1 tablet (100 mg total) by mouth daily. 30 tablet 0 Past Week at Unknown time  . traZODone (DESYREL) 50 MG tablet Take 100 mg by mouth at bedtime.    Past Week at Unknown time  . triamterene-hydrochlorothiazide  (MAXZIDE-25) 37.5-25 MG tablet Take 2 tablets by mouth daily.   Past Week at Unknown time  . Vitamin D, Ergocalciferol, (DRISDOL) 50000 units CAPS capsule Take 50,000 Units by mouth every 7 (seven) days.   11/05/2017 at Unknown time    Patient Stressors: Legal issue Medication change or noncompliance  Patient Strengths: Ability for insight General fund of knowledge Motivation for treatment/growth Physical Health  Treatment Modalities: Medication Management, Group therapy, Case management,  1 to 1 session with clinician, Psychoeducation, Recreational therapy.   Physician Treatment Plan for Primary Diagnosis: MDD, single episode, severe with psychosis  Medication Management: Evaluate patient's response, side effects, and tolerance of medication regimen.  Therapeutic Interventions: 1 to 1 sessions, Unit Group sessions and Medication administration.  Evaluation of Outcomes: Progressing  Physician Treatment Plan for Secondary Diagnosis: Principal Problem:   MDD (major depressive disorder), recurrent, severe, with psychosis (HCC) Active Problems:   PTSD (post-traumatic stress disorder)   Medication Management: Evaluate patient's response, side effects, and tolerance of medication regimen.  Therapeutic Interventions: 1 to 1 sessions, Unit Group sessions and Medication administration.  Evaluation of Outcomes: Progressing   RN Treatment Plan for Primary Diagnosis: MDD, single episode, severe with psychosis Long Term Goal(s): Knowledge of disease and therapeutic regimen to maintain health will improve  Short Term Goals: Ability to remain free from injury will improve, Ability to disclose and discuss suicidal ideas and Ability to identify and develop effective coping behaviors will improve  Medication Management: RN will administer medications as ordered by provider, will assess and evaluate patient's response and provide education to patient for prescribed medication. RN will report  any adverse and/or side effects to prescribing provider.  Therapeutic Interventions: 1 on 1 counseling sessions, Psychoeducation, Medication administration, Evaluate responses to treatment, Monitor vital signs and CBGs as ordered, Perform/monitor CIWA, COWS, AIMS and Fall Risk screenings as ordered, Perform wound care treatments as ordered.  Evaluation of Outcomes: Progressing   LCSW Treatment Plan for Primary Diagnosis: MDD, single episode, severe with psychosis Long Term Goal(s): Safe transition to appropriate next level of care at discharge, Engage patient in therapeutic group addressing interpersonal concerns.  Short Term Goals: Engage patient in aftercare planning with referrals and resources, Facilitate patient progression through stages of change regarding substance use diagnoses and concerns and Identify triggers associated with mental health/substance abuse issues  Therapeutic Interventions: Assess for all discharge needs, 1 to 1 time with Social worker, Explore available resources and support systems, Assess for adequacy in community support network,  Educate family and significant other(s) on suicide prevention, Complete Psychosocial Assessment, Interpersonal group therapy.  Evaluation of Outcomes: Progressing   Progress in Treatment: Attending groups: Yes. Participating in groups: Yes. Taking medication as prescribed: Yes. Toleration medication: Yes. Family/Significant other contact made: SPE completed with pt; pt declined to consent to family contact.  Patient understands diagnosis: Yes. Discussing patient identified problems/goals with staff: Yes. Medical problems stabilized or resolved: Yes  Denies suicidal/homicidal ideation: Yes. Issues/concerns per patient self-inventory: No. Other: n/a   New problem(s) identified: No, Describe:  n/a   New Short Term/Long Term Goal(s): elimination of SI thoughts; development of comprehensive mental wellness/sobriety plan, medication  management for mood stabilization.   Discharge Plan or Barriers:  Pt plans to return home and will likely resume services at [facility] made. [facility] pamphlet and AA/NA information provided to pt.   Reason for Continuation of Hospitalization: Anxiety Depression Medication stabilization  Estimated Length of Stay: Thursday, 11/29/17  Attendees: Patient: 11/28/2017 8:37 AM  Physician: Dr. Altamese Duncan MD; Dr. Jama Flavors MD 11/28/2017 8:37 AM  Nursing: Caroline/Patrice/Jane RN 11/28/2017 8:37 AM  RN Care Manager: x 11/28/2017 8:37 AM  Social Worker: Trula Slade, LCSW 11/28/2017 8:37 AM  Recreational Therapist: x 11/28/2017 8:37 AM  Other: Armandina Stammer NP; Feliz Beam Money NP 11/28/2017 8:37 AM  Other:  11/28/2017 8:37 AM  Other: 11/28/2017 8:37 AM    Scribe for Treatment Team: Ledell Peoples Smart, LCSW 11/28/2017 8:37 AM

## 2017-11-28 NOTE — Progress Notes (Addendum)
Pt has been accepted per Durward Mallard at [facility] to bed 320 at [facility] for ECT with Dr. Toni Amend. Dr. Altamese Gunter MD, Armandina Stammer NP, and RN notified of above. Camille contact number: [phone]. She requested that pt come immediately. Charge RN Marcell Barlow and [facility], Berneice Heinrich also notified of above. Number to call report: [phone].   Trula Slade, MSW, LCSW Clinical Social Worker 11/28/2017 4:05 PM

## 2017-11-29 ENCOUNTER — Other Ambulatory Visit: Payer: Self-pay

## 2017-11-29 DIAGNOSIS — I10 Essential (primary) hypertension: Secondary | ICD-10-CM

## 2017-11-29 MED ORDER — ADULT MULTIVITAMIN W/MINERALS CH
1.0000 | ORAL_TABLET | Freq: Every day | ORAL | Status: DC
  Administered 2017-11-29 – 2017-12-18 (×20): 1 via ORAL
  Filled 2017-11-29 (×20): qty 1

## 2017-11-29 MED ORDER — BOOST / RESOURCE BREEZE PO LIQD CUSTOM
1.0000 | Freq: Three times a day (TID) | ORAL | Status: DC
  Administered 2017-11-29 – 2017-12-05 (×14): 1 via ORAL

## 2017-11-29 MED ORDER — PROMETHAZINE HCL 25 MG PO TABS
12.5000 mg | ORAL_TABLET | Freq: Four times a day (QID) | ORAL | Status: DC | PRN
  Administered 2017-11-29: 12.5 mg via ORAL
  Filled 2017-11-29: qty 1

## 2017-11-29 NOTE — Progress Notes (Signed)
Patient is calm and quiet most of the time, appeared depressed and hope less support and encouragement rendered , patient is sleeping long hours at a time contract for safety of self , denies suicide intents or ideas , no distress noted.

## 2017-11-29 NOTE — Progress Notes (Signed)
Recreation Therapy Notes   Date: 01.17.2019  Time: 1:00 PM  Location: Craft Room  Behavioral response: Appropriate  Intervention Topic: Anger  Discussion/Intervention: Group content on today was focused on anger management. The group defined anger and reasons they become angry. Individuals expressed negative way they have dealt with anger in the past. Patients stated some positive ways they could deal with anger in the future. The group described how anger can affect your health and daily plans. Individuals participated in the intervention "Score your anger" where they had a chance to answer questions about themselves and get a score of their anger.  Clinical Observations/Feedback:  Patient came to group and was focused on what her peers and staff had to say about the topic at hand. She participated in the intervention during group.  Ebonique Hallstrom LRT/CTRS         Morgyn Marut 11/30/2017 9:01 AM

## 2017-11-29 NOTE — Plan of Care (Signed)
Patient was oriented to unit. Patient denied SI/HI. Patient's Safety was maintained on unit.    Progressing Education: Knowledge of [facility] Education information/materials will improve 11/29/2017 2018 - Progressing by Berkley Harvey, RN Self-Concept: Ability to disclose and discuss suicidal ideas will improve 11/29/2017 2018 - Progressing by Addison Naegeli I, RN Safety: Ability to remain free from injury will improve 11/29/2017 2018 - Progressing by Berkley Harvey, RN

## 2017-11-29 NOTE — [facility] Group Notes (Signed)
LCSW Group Therapy Note 11/29/2017 9:00 AM  Type of Therapy and Topic:  Group Therapy:  Setting Goals  Participation Level:  Did Not Attend  Description of Group: In this process group, patients discussed using strengths to work toward goals and address challenges.  Patients identified two positive things about themselves and one goal they were working on.  Patients were given the opportunity to share openly and support each other's plan for self-empowerment.  The group discussed the value of gratitude and were encouraged to have a daily reflection of positive characteristics or circumstances.  Patients were encouraged to identify a plan to utilize their strengths to work on current challenges and goals.  Therapeutic Goals 1. Patient will verbalize personal strengths/positive qualities and relate how these can assist with achieving desired personal goals 2. Patients will verbalize affirmation of peers plans for personal change and goal setting 3. Patients will explore the value of gratitude and positive focus as related to successful achievement of goals 4. Patients will verbalize a plan for regular reinforcement of personal positive qualities and circumstances.  Summary of Patient Progress:       Therapeutic Modalities Cognitive Behavioral Therapy Motivational Interviewing    Julia Frame, LCSW 11/29/2017 2:55 PM

## 2017-11-29 NOTE — Progress Notes (Addendum)
Initial Nutrition Assessment  DOCUMENTATION CODES:   Not applicable  INTERVENTION:   Boost Breeze po TID, each supplement provides 250 kcal and 9 grams of protein  MVI daily   Recommend thiamine 179m po daily  Recommend folic acid 117mpo daily  Recommend appetite stimulant   Recommend check vitamin labs as outpatient; pt at risk for deficiencies in biotin, thiamine, B12. Folate, iron, vitamins D, E, & K, zinc, and copper.   NUTRITION DIAGNOSIS:   Inadequate oral intake related to social / environmental circumstances(depression ) as evidenced by meal completion < 25%.  GOAL:   Patient will meet greater than or equal to 90% of their needs  MONITOR:   PO intake, Supplement acceptance, Weight trends  REASON FOR ASSESSMENT:   Malnutrition Screening Tool    ASSESSMENT:   [age]ear old female with history of hypertension, hyperlipidemia, GERD, depression, anxiety, stomach ulcer, iron deficiency anemia, CHF with EF of 45%, chronic back pain, alcohol abuse, GI bleeding, gastric bypass surgery, MDD and PTSD admitted for worsening depression and SI   Pt s/p gastric bypass 2005; s/p diag lap 2008 for SBO, incarcerated internal hernias x2; s/p diag lap 2015 for possible jejunal intussusception. S/p diagnostic laparoscopy on 11/11/2017 for intususeption.   Met with pt in room today. Pt reports poor appetite and oral intake for several weeks pta. Pt believes that her poor appetite is r/t her depression as well as she reports intermittent sharp abdominal pains in her left, upper abdomen. Pt s/p multiple small bowel surgeries including roux-en-y in 2005. Pt reports that she does not take her vitamins regularly as she can not afford them. Pt reports her UBW around 165lbs; per chart, pt has lost 9lbs(5%) over the past week which is significant. Pt is currently eating <25% of meals. Pt is drinking some Boost Breeze and she reports that she drinks these at home sometimes. Pt does not tolerate  milky substances well and can't tolerate Ensure. RD will order multivitamins. Recommend appetite stimulant to help increase po intake. Recommend check vitamin labs as outpatient; pt at risk for deficiencies in biotin, thiamine, B12. Folate, iron, vitamins D, E, & K, zinc, and copper.   Medications reviewed and include: ferrous sulfate  Labs reviewed:   Diet Order:  Diet regular Room service appropriate? Yes; Fluid consistency: Thin  EDUCATION NEEDS:   Education needs have been addressed  Skin:  Skin Assessment: (closed incision from December 2018)  Last BM:  pta  Height:   Ht Readings from Last 1 Encounters:  11/28/17 5' 7" (1.702 m)    Weight:   Wt Readings from Last 1 Encounters:  11/28/17 156 lb (70.8 kg)    Ideal Body Weight:  61.36 kg  BMI:  Body mass index is 24.43 kg/m.  Estimated Nutritional Needs:   Kcal:  1700-2000kcal/day   Protein:  71-85g/day   Fluid:  >1.7L/day   CaKoleen DistanceS, RD, LDN Pager #- [phone] Hours Pager: [phone]
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
## 2017-11-29 NOTE — [facility] Suicide Risk Assessment (Signed)
[facility] Admission Suicide Risk Assessment   Nursing information obtained from:   Review of nursing notes Demographic factors:   Past history of suicide attempts Current Mental Status:   Extremely depressed withdrawn and negativistic loss Factors:   History of PTSD Historical Factors:   PTSD and depression Risk Reduction Factors:   Family support  Total Time spent with patient: 45 minutes Principal Problem: Severe recurrent major depression with psychotic features (HCC) Diagnosis:   Patient Active Problem List   Diagnosis Date Noted  . Severe recurrent major depression with psychotic features (HCC) [F33.3] 11/28/2017  . MDD (major depressive disorder), single episode, severe with psychosis (HCC) [F32.3] 11/22/2017  . Hyperactive small intestine s/p Dx laparoscopy 11/11/2017 [K59.9] 11/12/2017  . MDD (major depressive disorder), recurrent severe, without psychosis (HCC) [F33.2] 11/12/2017  . Depression with anxiety [F41.8] 11/11/2017  . GERD (gastroesophageal reflux disease) [K21.9] 11/11/2017  . Alcohol abuse [F10.10] 11/11/2017  . Diverticulosis [K57.90] 05/24/2017  . Internal hemorrhoids [K64.8] 05/24/2017  . Rectal bleeding [K62.5]   . Abnormal abdominal CT scan [R93.5]   . Gastric bypass status for obesity [Z98.84]   . Esophageal dysphagia [R13.10]   . Symptomatic anemia [D64.9] 05/22/2017  . Chronic left systolic heart failure (HCC) [I50.22] 06/26/2016  . Weight loss [R63.4] 05/04/2016  . B12 deficiency [E53.8] 05/04/2016  . MDD (major depressive disorder), recurrent, severe, with psychosis (HCC) [F33.3] 08/26/2015  . PTSD (post-traumatic stress disorder) [F43.10] 08/26/2015  . Alcohol use disorder, moderate, dependence (HCC) [F10.20] 08/26/2015  . Head trauma [S09.90XA] 08/26/2015  . AP (abdominal pain) [R10.9]   . Abdominal pain [R10.9] 12/27/2014  . Enteritis [K52.9] 12/27/2014  . Nausea with vomiting [R11.2] 12/27/2014  . Hyponatremia [E87.1] 12/27/2014  . Essential  hypertension [I10] 12/27/2014  . Depression [F32.9] 12/27/2014  . Anemia, iron deficiency [D50.9] 12/27/2014  . Sickle cell trait (HCC) [D57.3]   . Iron deficiency anemia [D50.9]   . UGI bleed [K92.2] 05/18/2014  . Abdominal pain, left upper quadrant [R10.12] 05/18/2014  . Intussusception of intestine - physiologic & self-resolved [K56.1] 05/17/2014   Subjective Data: 52 year old woman with a history of severe recurrent depression.  Continues to report multiple symptoms of depression.  No acute suicidal intent but continues to have hopelessness and negativity.  Poor self-care.  Disorganized thinking.  However patient is alert and oriented and able to make decisions about her hospitalization and treatment  Continued Clinical Symptoms:  Alcohol Use Disorder Identification Test Final Score (AUDIT): 1 The "Alcohol Use Disorders Identification Test", Guidelines for Use in Primary Care, Second Edition.  World Science writer [facility]). Score between 0-7:  no or low risk or alcohol related problems. Score between 8-15:  moderate risk of alcohol related problems. Score between 16-19:  high risk of alcohol related problems. Score 20 or above:  warrants further diagnostic evaluation for alcohol dependence and treatment.   CLINICAL FACTORS:   Depression:   Comorbid alcohol abuse/dependence Hopelessness Severe   Musculoskeletal: Strength & Muscle Tone: within normal limits Gait & Station: normal Patient leans: N/A  Psychiatric Specialty Exam: Physical Exam  Nursing note and vitals reviewed. Constitutional: She appears well-developed.  HENT:  Head: Normocephalic and atraumatic.  Eyes: Conjunctivae are normal. Pupils are equal, round, and reactive to light.  Neck: Normal range of motion.  Cardiovascular: Regular rhythm and normal heart sounds.  Respiratory: No respiratory distress.  GI: Soft.  Musculoskeletal: Normal range of motion.  Neurological: She is alert.  Skin: Skin is warm and  dry.  Psychiatric: Judgment normal.  Her affect is blunt. Her speech is delayed. She is slowed and withdrawn. Cognition and memory are normal. She exhibits a depressed mood. She expresses suicidal ideation. She expresses no suicidal plans.    Review of Systems  Constitutional: Negative.   HENT: Negative.   Eyes: Negative.   Respiratory: Negative.   Cardiovascular: Negative.   Gastrointestinal: Negative.   Musculoskeletal: Negative.   Skin: Negative.   Neurological: Negative.   Psychiatric/Behavioral: Positive for depression, hallucinations, memory loss and suicidal ideas. Negative for substance abuse. The patient is nervous/anxious and has insomnia.     Blood pressure 114/86, pulse (!) 117, temperature 98.9 F (37.2 C), temperature source Oral, resp. rate 18, height 5\' 7"  (1.702 m), weight 70.8 kg (156 lb), last menstrual period 08/28/2015, SpO2 99 %.Body mass index is 24.43 kg/m.  General Appearance: Casual  Eye Contact:  Good  Speech:  Slow  Volume:  Decreased  Mood:  Depressed  Affect:  Congruent  Thought Process:  Goal Directed  Orientation:  Full (Time, Place, and Person)  Thought Content:  Rumination  Suicidal Thoughts:  Yes.  without intent/plan  Homicidal Thoughts:  No  Memory:  Immediate;   Good Recent;   Fair Remote;   Fair  Judgement:  Fair  Insight:  Fair  Psychomotor Activity:  Decreased  Concentration:  Concentration: Fair  Recall:  [employer] of Knowledge:  Fair  Language:  Fair  Akathisia:  No  Handed:  Right  AIMS (if indicated):     Assets:  Desire for Improvement Housing Resilience Social Support  ADL's:  Intact  Cognition:  Impaired,  Mild  Sleep:  Number of Hours: 7      COGNITIVE FEATURES THAT CONTRIBUTE TO RISK:  Thought constriction (tunnel vision)    SUICIDE RISK:   Mild:  Suicidal ideation of limited frequency, intensity, duration, and specificity.  There are no identifiable plans, no associated intent, mild dysphoria and related  symptoms, good self-control (both objective and subjective assessment), few other risk factors, and identifiable protective factors, including available and accessible social support.  PLAN OF CARE: Patient is admitted to the psychiatric ward with a plan to begin ECT treatment tomorrow while continuing antidepressant medication.  Continual reassessment for suicidality and work on appropriate safety and discharge planning  I certify that inpatient services furnished can reasonably be expected to improve the patient's condition.   Mordecai Rasmussen, MD 11/29/2017, 6:34 PM

## 2017-11-29 NOTE — [facility] Group Notes (Signed)
[facility] Group Notes:  (Nursing/MHT/Case Management/Adjunct)  Date:  11/29/2017  Time:  3:42 PM  Type of Therapy:  Psychoeducational Skills  Participation Level:  Minimal  Participation Quality:  Attentive  Affect:  Flat  Cognitive:  Oriented  Insight:  Improving  Engagement in Group:  Limited and Poor  Modes of Intervention:  Discussion and Education  Summary of Progress/Problems:  Julia Wheeler 11/29/2017, 3:42 PM

## 2017-11-29 NOTE — Plan of Care (Signed)
  Coping: Ability to cope will improve 11/29/2017 1910 - Progressing by Elige Radon, RN Note Attending groups   Medication: Compliance with prescribed medication regimen will improve 11/29/2017 1910 - Progressing by Elige Radon, RN Note Medication comnpliant   Self-Concept: Ability to disclose and discuss suicidal ideas will improve 11/29/2017 1910 - Progressing by Elige Radon, RN Note Denies SI   Safety: Ability to remain free from injury will improve 11/29/2017 1910 - Progressing by Elige Radon, RN Note Remains safe on the unit

## 2017-11-29 NOTE — Progress Notes (Signed)
Presents to nurses station stating that she feels  nauseated and that she feels as though she may throw-up.

## 2017-11-29 NOTE — [facility] Group Notes (Signed)
11/29/2017 9:30AM  Type of Therapy/Topic:  Group Therapy:  Balance in Life  Participation Level:  Active  Description of Group:   This group will address the concept of balance and how it feels and looks when one is unbalanced. Patients will be encouraged to process areas in their lives that are out of balance and identify reasons for remaining unbalanced. Facilitators will guide patients in utilizing problem-solving interventions to address and correct the stressor making their life unbalanced. Understanding and applying boundaries will be explored and addressed for obtaining and maintaining a balanced life. Patients will be encouraged to explore ways to assertively make their unbalanced needs known to significant others in their lives, using other group members and facilitator for support and feedback.  Therapeutic Goals: 1. Patient will identify two or more emotions or situations they have that consume much of in their lives. 2. Patient will identify signs/triggers that life has become out of balance:  3. Patient will identify two ways to set boundaries in order to achieve balance in their lives:  4. Patient will demonstrate ability to communicate their needs through discussion and/or role plays  Summary of Patient Progress: Actively and appropriately engaged in the group. Patient was able to provide support and validation to other group members.Patient practiced active listening when interacting with the facilitator and other group members Patient in still in the process of obtaining treatment goals. Julia Wheeler says that her mood is off balance and that she cant seem to find a way to bring it back up. She is hoping that the medications will help her.       Therapeutic Modalities:   Cognitive Behavioral Therapy Solution-Focused Therapy Assertiveness Training  Julia Wheeler, [place]

## 2017-11-29 NOTE — [facility] Suicide Risk Assessment (Signed)
[facility] INPATIENT:  Family/Significant Other Suicide Prevention Education  Suicide Prevention Education:  Education Completed; Son, Julia Wheeler, [phone] been identified by the patient as the family member/significant other with whom the patient will be residing, and identified as the person(s) who will aid the patient in the event of a mental health crisis (suicidal ideations/suicide attempt).  With written consent from the patient, the family member/significant other has been provided the following suicide prevention education, prior to the and/or following the discharge of the patient.  The suicide prevention education provided includes the following:  Suicide risk factors  Suicide prevention and interventions  National Suicide Hotline telephone number  [facility] assessment telephone number  [place] Emergency Assistance 911  [facility] and/or Residential Mobile Crisis Unit telephone number  Request made of family/significant other to:  Remove weapons (e.g., guns, rifles, knives), all items previously/currently identified as safety concern.    Remove drugs/medications (over-the-counter, prescriptions, illicit drugs), all items previously/currently identified as a safety concern.  The family member/significant other verbalizes understanding of the suicide prevention education information provided.  The family member/significant other agrees to remove the items of safety concern listed above.  Son has concerns about ECT and would like the doctor to contact him regarding that procedure. Son reports that there are no guns/weapons in the home.   Julia Wheeler 11/29/2017, 2:23 PM
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
## 2017-11-29 NOTE — Progress Notes (Signed)
D: Patient denies SI/HI/AVH. Patient presents with disorganized thoughts stating "my mind is just so fuzzy, I forgot where I was" during assessment. Patient was able to be reoriented to unit. Patient is depressed, complains of anxiety and is flat in affect. Patient has no physical complaints at this time. Patient is isolative to room, and stays in bed for most of shift. Patient to remain NPO after midnight tonight.   A: Patient was assessed by this nurse. Patient encouraged to report any thoughts of SI/HI/AVH to staff. Patient received scheduled medications. Q x 15 minute observation checks were completed for safety. Patient was provided with verbal education on provided medications. Patient care plan was reviewed. Patient was offered support and encouragement. Patient was encourage to attend groups, participate in unit activities and continue with plan of care.   R: Patient adheres with scheduled medication. Patient has no complaints of pain at this time. Patient is receptive to treatment and safety maintained on unit.

## 2017-11-30 ENCOUNTER — Other Ambulatory Visit: Payer: Self-pay | Admitting: Psychiatry

## 2017-11-30 ENCOUNTER — Encounter: Payer: Self-pay | Admitting: Anesthesiology

## 2017-11-30 ENCOUNTER — Inpatient Hospital Stay: Payer: No Typology Code available for payment source | Admitting: Anesthesiology

## 2017-11-30 ENCOUNTER — Inpatient Hospital Stay: Payer: No Typology Code available for payment source

## 2017-11-30 LAB — GLUCOSE, CAPILLARY: Glucose-Capillary: 107 mg/dL — ABNORMAL HIGH (ref 65–99)

## 2017-11-30 MED ORDER — ONDANSETRON HCL 4 MG/2ML IJ SOLN
INTRAMUSCULAR | Status: AC
  Filled 2017-11-30: qty 2

## 2017-11-30 MED ORDER — METHOHEXITAL SODIUM 100 MG/10ML IV SOSY: PREFILLED_SYRINGE | INTRAVENOUS | Status: DC | PRN

## 2017-11-30 MED ORDER — SODIUM CHLORIDE 0.9 % IV SOLN
500.0000 mL | Freq: Once | INTRAVENOUS | Status: AC
  Administered 2017-11-30: 500 mL via INTRAVENOUS

## 2017-11-30 MED ORDER — MIRTAZAPINE 15 MG PO TABS
7.5000 mg | ORAL_TABLET | Freq: Every day | ORAL | Status: DC
  Administered 2017-11-30 – 2017-12-01 (×2): 7.5 mg via ORAL
  Filled 2017-11-30 (×2): qty 1

## 2017-11-30 MED ORDER — SODIUM CHLORIDE 0.9 % IV SOLN
INTRAVENOUS | Status: DC | PRN
  Administered 2017-11-30: 11:00:00 via INTRAVENOUS

## 2017-11-30 MED ORDER — SERTRALINE HCL 100 MG PO TABS
200.0000 mg | ORAL_TABLET | Freq: Every day | ORAL | Status: DC
  Administered 2017-12-01 – 2017-12-18 (×18): 200 mg via ORAL
  Filled 2017-11-30 (×19): qty 2

## 2017-11-30 MED ORDER — SUCCINYLCHOLINE CHLORIDE 200 MG/10ML IV SOSY
PREFILLED_SYRINGE | INTRAVENOUS | Status: DC | PRN
  Administered 2017-11-30: 80 mg via INTRAVENOUS

## 2017-11-30 MED ORDER — METHOHEXITAL SODIUM 100 MG/10ML IV SOSY
PREFILLED_SYRINGE | INTRAVENOUS | Status: DC | PRN
  Administered 2017-11-30: 60 mg via INTRAVENOUS

## 2017-11-30 MED ORDER — ONDANSETRON HCL 4 MG/2ML IJ SOLN
4.0000 mg | Freq: Once | INTRAMUSCULAR | Status: AC
  Administered 2017-11-30: 4 mg via INTRAVENOUS

## 2017-11-30 NOTE — Progress Notes (Signed)
Informed on coming shift of patients CBG and B/P - HR. Patient received B/P medications before procedure per protocol.

## 2017-11-30 NOTE — Progress Notes (Signed)
Recreation Therapy Notes  Date: 01.18.2019  Time: 1:00 PM  Location: Craft Room  Behavioral response: N/A  Intervention Topic: Emotions  Discussion/Intervention: Patient did not attend group.  Clinical Observations/Feedback:  Patient did not attend group.   Innocence Schlotzhauer LRT/CTRS           Dhriti Fales 11/30/2017 2:49 PM

## 2017-11-30 NOTE — Anesthesia Postprocedure Evaluation (Signed)
Anesthesia Post Note  Patient: Julia Wheeler  Procedure(s) Performed: ECT TX  Patient location during evaluation: PACU Anesthesia Type: General Level of consciousness: awake and alert Pain management: pain level controlled Vital Signs Assessment: post-procedure vital signs reviewed and stable Respiratory status: spontaneous breathing, nonlabored ventilation, respiratory function stable and patient connected to nasal cannula oxygen Cardiovascular status: blood pressure returned to baseline and stable Postop Assessment: no apparent nausea or vomiting Anesthetic complications: no     Last Vitals:  Vitals:   11/30/17 1232 11/30/17 1242  BP: 110/78 113/81  Pulse: (!) 104 (!) 101  Resp:    Temp:    SpO2: 95% 96%    Last Pain:  Vitals:   11/30/17 1112  TempSrc:   PainSc: 0-No pain                 Cleda Mccreedy [name]

## 2017-11-30 NOTE — [facility] Group Notes (Signed)
  11/30/2017  Time: 0930  Type of Therapy and Topic:  Group Therapy:  Feelings around Relapse and Recovery  Participation Level:  Minimal   Description of Group:    Patients in this group will discuss emotions they experience before and after a relapse. They will process how experiencing these feelings, or avoidance of experiencing them, relates to having a relapse. Facilitator will guide patients to explore emotions they have related to recovery. Patients will be encouraged to process which emotions are more powerful. They will be guided to discuss the emotional reaction significant others in their lives may have to their relapse or recovery. Patients will be assisted in exploring ways to respond to the emotions of others without this contributing to a relapse.  Therapeutic Goals: 1. Patient will identify two or more emotions that lead to a relapse for them 2. Patient will identify two emotions that result when they relapse 3. Patient will identify two emotions related to recovery 4. Patient will demonstrate ability to communicate their needs through discussion and/or role plays   Summary of Patient Progress: Pt continues to work towards their tx goals but has not yet reached them. Pt was able to appropriately participate in group discussion, and was able to offer support/validation to other group members. Pt was tearful during group and stated, "I took two bottles of pills and drank some alcohol, and when I woke up I was angry that I hadn't been successful." Pt reported feeling depressed for the last year and hearing voices telling her to "take the pills and it will all be over." Pt was not able to identify any risk factors for her mental health relapse, and was not able to identify ways that she could avoid future relapses.    Therapeutic Modalities:   Cognitive Behavioral Therapy Solution-Focused Therapy Assertiveness Training Relapse Prevention Therapy  Heidi Dach, MSW,  LCSW 11/30/2017 10:24 AM
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
## 2017-11-30 NOTE — Anesthesia Procedure Notes (Signed)
Date/Time: 11/30/2017 12:01 PM Performed by: Lily Kocher, CRNA Pre-anesthesia Checklist: Patient identified, Emergency Drugs available, Suction available and Patient being monitored Patient Re-evaluated:Patient Re-evaluated prior to induction Oxygen Delivery Method: Circle system utilized Preoxygenation: Pre-oxygenation with 100% oxygen Induction Type: IV induction Ventilation: Mask ventilation without difficulty and Mask ventilation throughout procedure Airway Equipment and Method: Bite block Placement Confirmation: positive ETCO2 Dental Injury: Teeth and Oropharynx as per pre-operative assessment

## 2017-11-30 NOTE — Progress Notes (Signed)
Report to [facility]

## 2017-11-30 NOTE — Progress Notes (Signed)
D: Patient denies SI/HI/AVH. Patient verbally contracts for safety. Patient is flat, sad, depressed, with signs of anxiety. Patient is isolative to room, with little engagement with staff. Patient has complaint of mild headache post ECT. Patient requests for PRN medication to be given with scheduled medications. Patient requests to be "left to rest."   A: Patient was assessed by this nurse. Patient was oriented to unit. Patient's safety was maintained on unit. Q x 15 minute observation checks were completed for safety. Patient care plan was reviewed. Patient was offered support and encouragement. Patient was encourage to attend groups, participate in unit activities and continue with plan of care.   R: Patient has expressed relief of symptoms with PRN medications. Patient is receptive to treatment and safety maintained on unit.

## 2017-11-30 NOTE — Procedures (Signed)
ECT SERVICES Physician's Interval Evaluation & Treatment Note  Patient Identification: Julia Wheeler MRN:  [id number] Date of Evaluation:  11/30/2017 TX #: 1  MADRS: 47  MMSE: 30  P.E. Findings:  Vitals stable heart and lungs normal neurologically intact  Psychiatric Interval Note:  Severely depressed withdrawn flat but lucid and understands the treatment plan  Subjective:  Patient is a 52 y.o. female seen for evaluation for Electroconvulsive Therapy. Depressed  Treatment Summary:   []   Right Unilateral             [x]  Bilateral   % Energy : 1.0 ms 25%   Impedance: 1010 ohms  Seizure Energy Index: 19,320 V squared  Postictal Suppression Index: 72%  Seizure Concordance Index: 98%  Medications  Pre Shock: Zofran 4 mg Brevital 60 mg succinylcholine 80 mg  Post Shock:    Seizure Duration: 44 seconds EMG 72 seconds EEG   Comments: Follow-up next week inpatient Monday Wednesday Friday plan  Lungs:  [x]   Clear to auscultation               []  Other:   Heart:    [x]   Regular rhythm             []  irregular rhythm    [x]   Previous H&P reviewed, patient examined and there are NO CHANGES                 []   Previous H&P reviewed, patient examined and there are changes noted.   Mordecai Rasmussen, MD 1/18/201911:56 AM

## 2017-11-30 NOTE — Plan of Care (Signed)
Patient is oriented to unit. Patient's safety is maintained. Patient denies SI/HI. Patient is isolated to room and has little engagement with staff and peers.    Progressing Education: Knowledge of  General Education information/materials will improve 11/30/2017 2016 - Progressing by Berkley Harvey, RN Self-Concept: Ability to disclose and discuss suicidal ideas will improve 11/30/2017 2016 - Progressing by Addison Naegeli I, RN Safety: Ability to remain free from injury will improve 11/30/2017 2016 - Progressing by Berkley Harvey, RN

## 2017-11-30 NOTE — Transfer of Care (Signed)
Immediate Anesthesia Transfer of Care Note  Patient: Julia Wheeler  Procedure(s) Performed: ECT TX  Patient Location: PACU  Anesthesia Type:General  Level of Consciousness: sedated  Airway & Oxygen Therapy: Patient Spontanous Breathing and Patient connected to face mask oxygen  Post-op Assessment: Report given to RN and Post -op Vital signs reviewed and stable  Post vital signs: stable  Last Vitals:  Vitals:   11/30/17 1232 11/30/17 1242  BP: 110/78 113/81  Pulse: (!) 104 (!) 101  Resp:    Temp:    SpO2: 95% 96%    Last Pain:  Vitals:   11/30/17 1112  TempSrc:   PainSc: 0-No pain         Complications: No apparent anesthesia complications
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
## 2017-11-30 NOTE — Plan of Care (Signed)
  Education: Knowledge of [facility] Education information/materials will improve 11/30/2017 1533 - Progressing by Elige Radon, RN   Medication: Compliance with prescribed medication regimen will improve 11/30/2017 1533 - Progressing by Elige Radon, RN Note Medication compliant   Self-Concept: Ability to disclose and discuss suicidal ideas will improve 11/30/2017 1533 - Progressing by Elige Radon, RN Note Denies SI or any thoughts of self harm at this time   Safety: Ability to remain free from injury will improve 11/30/2017 1533 - Progressing by Elige Radon, RN Note Remains safe on the unit

## 2017-11-30 NOTE — Tx Team (Signed)
Interdisciplinary Treatment and Diagnostic Plan Update  11/30/2017 Time of Session: 2:25PM Julia Wheeler MRN: [id number]  Principal Diagnosis: Severe recurrent major depression with psychotic features [facility])  Secondary Diagnoses: Principal Problem:   Severe recurrent major depression with psychotic features (HCC) Active Problems:   Sickle cell trait (HCC)   Nausea with vomiting   Essential hypertension   PTSD (post-traumatic stress disorder)   Alcohol use disorder, moderate, dependence (HCC)   Current Medications:  Current Facility-Administered Medications  Medication Dose Route Frequency Provider Last Rate Last Dose  . acetaminophen (TYLENOL) tablet 650 mg  650 mg Oral Q6H PRN Clapacs, Jackquline Denmark, MD   650 mg at 11/30/17 1351  . alum & mag hydroxide-simeth (MAALOX/MYLANTA) 200-200-20 MG/5ML suspension 30 mL  30 mL Oral Q4H PRN Clapacs, [name], MD      . ARIPiprazole (ABILIFY) tablet 10 mg  10 mg Oral Daily Clapacs, [name], MD   10 mg at 11/30/17 1351  . atorvastatin (LIPITOR) tablet 10 mg  10 mg Oral QHS Clapacs, Jackquline Denmark, MD   10 mg at 11/29/17 2105  . carvedilol (COREG) tablet 6.25 mg  6.25 mg Oral BID WC Clapacs, [name], MD   6.25 mg at 11/30/17 0720  . feeding supplement (BOOST / RESOURCE BREEZE) liquid 1 Container  1 Container Oral TID BM Clapacs, Jackquline Denmark, MD   1 Container at 11/29/17 1909  . ferrous sulfate tablet 325 mg  325 mg Oral Daily Clapacs, [name], MD   325 mg at 11/30/17 1351  . haloperidol (HALDOL) tablet 5 mg  5 mg Oral Q6H PRN Clapacs, [name], MD      . hydrOXYzine (ATARAX/VISTARIL) tablet 50 mg  50 mg Oral Q6H PRN Clapacs, Jackquline Denmark, MD   50 mg at 11/29/17 2105  . loratadine (CLARITIN) tablet 10 mg  10 mg Oral Daily Clapacs, Jackquline Denmark, MD   10 mg at 11/30/17 1350  . LORazepam (ATIVAN) tablet 1 mg  1 mg Oral Q6H PRN Clapacs, [name], MD      . magnesium hydroxide (MILK OF MAGNESIA) suspension 30 mL  30 mL Oral Daily PRN Clapacs, [name], MD      . multivitamin with minerals tablet  1 tablet  1 tablet Oral Daily Clapacs, Jackquline Denmark, MD   1 tablet at 11/30/17 1350  . ondansetron (ZOFRAN) 4 MG/2ML injection           . promethazine (PHENERGAN) tablet 12.5 mg  12.5 mg Oral Q6H PRN Clapacs, Jackquline Denmark, MD   12.5 mg at 11/29/17 1509  . sertraline (ZOLOFT) tablet 150 mg  150 mg Oral Daily Clapacs, [name], MD   150 mg at 11/30/17 1351  . traZODone (DESYREL) tablet 150 mg  150 mg Oral QHS Clapacs, Jackquline Denmark, MD   150 mg at 11/29/17 2105  . triamterene-hydrochlorothiazide (MAXZIDE-25) 37.5-25 MG per tablet 2 tablet  2 tablet Oral Daily Clapacs, Jackquline Denmark, MD   2 tablet at 11/30/17 1245   PTA Medications: Medications Prior to Admission  Medication Sig Dispense Refill Last Dose  . acetaminophen (TYLENOL) 325 MG tablet Take 2 tablets (650 mg total) by mouth every 6 (six) hours as needed for mild pain.   11/29/2017  . alum & mag hydroxide-simeth (MAALOX/MYLANTA) 200-200-20 MG/5ML suspension Take 30 mLs by mouth every 4 (four) hours as needed for indigestion. 355 mL 0 11/29/2017  . ARIPiprazole (ABILIFY) 10 MG tablet Take 1 tablet (10 mg total) by mouth daily. For mood  control   11/29/2017  . atorvastatin (LIPITOR) 10 MG tablet Take 1 tablet (10 mg total) by mouth at bedtime. For high cholesterol   11/29/2017  . carvedilol (COREG) 6.25 MG tablet Take 1 tablet (6.25 mg total) by mouth 2 (two) times daily with a meal. For high blood pressure   11/30/2017 at Unknown time  . cetirizine (ZYRTEC) 10 MG tablet Take 1 tablet (10 mg total) by mouth as needed for allergies.  2 11/29/2017  . ferrous sulfate 325 (65 FE) MG tablet Take 1 tablet (325 mg total) by mouth daily. For anemia  1 11/29/2017  . haloperidol (HALDOL) 5 MG tablet Take 1 tablet (5 mg total) by mouth every 6 (six) hours as needed for agitation (psychosis).   11/29/2017  . hydrOXYzine (ATARAX/VISTARIL) 50 MG tablet Take 1 tablet (50 mg total) by mouth every 6 (six) hours as needed for anxiety. 30 tablet 0 11/29/2017  . lamoTRIgine (LAMICTAL) 25 MG tablet  Take 2 tablets (50 mg total) by mouth daily. For mood stabilization   11/29/2017  . LORazepam (ATIVAN) 1 MG tablet Take 1 tablet (1 mg total) by mouth every 6 (six) hours as needed for anxiety or sleep. 1 tablet 0 11/29/2017  . magnesium hydroxide (MILK OF MAGNESIA) 400 MG/5ML suspension Take 30 mLs by mouth daily as needed for mild constipation. 360 mL 0 11/29/2017  . sertraline (ZOLOFT) 50 MG tablet Take 3 tablets (150 mg total) by mouth daily. For depression   11/29/2017  . traZODone (DESYREL) 150 MG tablet Take 1 tablet (150 mg total) by mouth at bedtime. For sleep   11/29/2017  . triamterene-hydrochlorothiazide (MAXZIDE-25) 37.5-25 MG tablet Take 2 tablets by mouth daily. For high blood pressure   11/30/2017 at Unknown time    Patient Stressors: Financial difficulties Medication change or noncompliance  Patient Strengths: Ability for insight Motivation for treatment/growth Supportive family/friends  Treatment Modalities: Medication Management, Group therapy, Case management,  1 to 1 session with clinician, Psychoeducation, Recreational therapy.   Physician Treatment Plan for Primary Diagnosis: Severe recurrent major depression with psychotic features (HCC) Long Term Goal(s): Improvement in symptoms so as ready for discharge Improvement in symptoms so as ready for discharge   Short Term Goals: Ability to verbalize feelings will improve Ability to disclose and discuss suicidal ideas Ability to maintain clinical measurements within normal limits will improve Compliance with prescribed medications will improve  Medication Management: Evaluate patient's response, side effects, and tolerance of medication regimen.  Therapeutic Interventions: 1 to 1 sessions, Unit Group sessions and Medication administration.  Evaluation of Outcomes: Progressing  Physician Treatment Plan for Secondary Diagnosis: Principal Problem:   Severe recurrent major depression with psychotic features (HCC) Active  Problems:   Sickle cell trait (HCC)   Nausea with vomiting   Essential hypertension   PTSD (post-traumatic stress disorder)   Alcohol use disorder, moderate, dependence (HCC)  Long Term Goal(s): Improvement in symptoms so as ready for discharge Improvement in symptoms so as ready for discharge   Short Term Goals: Ability to verbalize feelings will improve Ability to disclose and discuss suicidal ideas Ability to maintain clinical measurements within normal limits will improve Compliance with prescribed medications will improve     Medication Management: Evaluate patient's response, side effects, and tolerance of medication regimen.  Therapeutic Interventions: 1 to 1 sessions, Unit Group sessions and Medication administration.  Evaluation of Outcomes: Progressing   RN Treatment Plan for Primary Diagnosis: Severe recurrent major depression with psychotic features (HCC) Long Term Goal(s):  Knowledge of disease and therapeutic regimen to maintain health will improve  Short Term Goals: Ability to remain free from injury will improve, Ability to demonstrate self-control, Ability to verbalize feelings will improve, Ability to disclose and discuss suicidal ideas and Compliance with prescribed medications will improve  Medication Management: RN will administer medications as ordered by provider, will assess and evaluate patient's response and provide education to patient for prescribed medication. RN will report any adverse and/or side effects to prescribing provider.  Therapeutic Interventions: 1 on 1 counseling sessions, Psychoeducation, Medication administration, Evaluate responses to treatment, Monitor vital signs and CBGs as ordered, Perform/monitor CIWA, COWS, AIMS and Fall Risk screenings as ordered, Perform wound care treatments as ordered.  Evaluation of Outcomes: Progressing   LCSW Treatment Plan for Primary Diagnosis: Severe recurrent major depression with psychotic features  (HCC) Long Term Goal(s): Safe transition to appropriate next level of care at discharge, Engage patient in therapeutic group addressing interpersonal concerns.  Short Term Goals: Engage patient in aftercare planning with referrals and resources, Increase social support, Increase ability to appropriately verbalize feelings, Increase emotional regulation, Identify triggers associated with mental health/substance abuse issues and Increase skills for wellness and recovery  Therapeutic Interventions: Assess for all discharge needs, 1 to 1 time with Social worker, Explore available resources and support systems, Assess for adequacy in community support network, Educate family and significant other(s) on suicide prevention, Complete Psychosocial Assessment, Interpersonal group therapy.  Evaluation of Outcomes: Progressing   Progress in Treatment: Attending groups: Yes. Participating in groups: Yes. Taking medication as prescribed: Yes. Toleration medication: Yes. Family/Significant other contact made: Yes, individual(s) contacted:  [employer], [phone] Patient understands diagnosis: Yes. Discussing patient identified problems/goals with staff: Yes. Medical problems stabilized or resolved: Yes. Denies suicidal/homicidal ideation: Yes. Issues/concerns per patient self-inventory: Yes. Other:   New problem(s) identified: No, Describe:  None  New Short Term/Long Term Goal(s): "I want to understand why I tried to kill myself." Discharge Plan or Barriers: To return home to live with her son and attend outpatient treatment with [facility]  Reason for Continuation of Hospitalization: Depression Medication stabilization  Estimated Length of Stay: 7+ days  Recreational Therapy: Patient Stressors: Depression Patient Goal: Patient will identify 3 triggers for depressive symptoms x5 days.    Attendees: Patient: Julia Wheeler 11/30/2017 2:38 PM  Physician: Mordecai Rasmussen, MD 11/30/2017 2:38 PM   Nursing:  11/30/2017 2:38 PM  RN Care Manager: 11/30/2017 2:38 PM  Social Worker: [name], LCSWA 11/30/2017 2:38 PM  Recreational Therapist: Danella Deis. Dreama Saa, LRT 11/30/2017 2:38 PM  Other: Heidi Dach, LCSW 11/30/2017 2:38 PM  Other:  11/30/2017 2:38 PM  Other: 11/30/2017 2:38 PM    Scribe for Treatment Team: [name], LCSW 11/30/2017 2:38 PM

## 2017-11-30 NOTE — Progress Notes (Signed)
Recreation Therapy Notes  INPATIENT RECREATION THERAPY ASSESSMENT  Patient Details Name: Julia Wheeler MRN: [id number] DOB: [birth year] Today's Date: 11/30/2017  Patient Stressors: Other (Comment)(Depression)  Coping Skills:   Talking  Personal Challenges: Communication, Concentration, Decision-Making, Expressing Yourself, Self-Esteem/Confidence, Social Interaction, Stress Management, Time Management, Trusting Others  Leisure Interests (2+):     Awareness of Community Resources:  No  Community Resources:     Current Use: No  If no, Barriers?:    Patient Strengths:  Good friend  Patient Identified Areas of Improvement:  Understand my depression  Current Recreation Participation:  Walking  Patient Goal for Hospitalization:  Try to understand how mym depression got so bad  [place] of Residence:  [place] of Residence:  [place]   Current [place] (including self-harm):  No  Current HI:  No  Consent to Intern Participation: N/A   Jailynn Lavalais 11/30/2017, 4:09 PM

## 2017-12-01 MED ORDER — DOCUSATE SODIUM 100 MG PO CAPS
100.0000 mg | ORAL_CAPSULE | Freq: Every day | ORAL | Status: DC
  Administered 2017-12-01 – 2017-12-03 (×3): 100 mg via ORAL
  Filled 2017-12-01 (×3): qty 1

## 2017-12-01 NOTE — Progress Notes (Signed)
Patient remains flat, sad and isolative. In room with curtains drawn resting with eyes closed. Patient states, "I still feel depressed and I don't understand why I am feeling this way." This writer asked patient if any particular event precipitated this event of depression and patient responded, "No, it is a combination of things that has happened over the years and I just can't take it anymore." Patient endorses poor appetite but agreed to attend meals to try to eat. Patient is compliant with her in between meal supplements, Resource Breeze. Patient remains safe on the unit with q 15 minute safety checks. Patient denies SI/HI/AVH.  Will continue to monitor. 

## 2017-12-01 NOTE — Plan of Care (Signed)
  Progressing Coping: Ability to cope will improve 12/01/2017 2008 - Progressing by Lelan Pons, RN Medication: Compliance with prescribed medication regimen will improve 12/01/2017 2008 - Progressing by Lelan Pons, RN Self-Concept: Ability to verbalize positive feelings about self will improve 12/01/2017 2008 - Progressing by Lelan Pons, RN [facility] Participation in Recreation Therapeutic Interventions STG-Other Recreation Therapy Goal (Specify) Description Patient will identify 3 triggers for depressive symptoms x5 days.   12/01/2017 2008 - Progressing by Lelan Pons, RN

## 2017-12-01 NOTE — [facility] Group Notes (Signed)
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
## 2017-12-01 NOTE — Plan of Care (Signed)
Patient remains flat, sad and isolative. In room with curtains drawn resting with eyes closed. Patient states, "I still feel depressed and I don't understand why I am feeling this way." This writer asked patient if any particular event precipitated this event of depression and patient responded, "No, it is a combination of things that has happened over the years and I just can't take it anymore." Patient endorses poor appetite but agreed to attend meals to try to eat. Patient is compliant with her in between meal supplements, Resource Breeze. Patient remains safe on the unit with q 15 minute safety checks. Patient denies SI/HI/AVH.  Will continue to monitor.

## 2017-12-02 ENCOUNTER — Other Ambulatory Visit: Payer: Self-pay | Admitting: Psychiatry

## 2017-12-02 LAB — URINALYSIS, COMPLETE (UACMP) WITH MICROSCOPIC
Bilirubin Urine: NEGATIVE
Glucose, UA: NEGATIVE mg/dL
HGB URINE DIPSTICK: NEGATIVE
Ketones, ur: NEGATIVE mg/dL
LEUKOCYTES UA: NEGATIVE
Nitrite: POSITIVE — AB
PH: 5 (ref 5.0–8.0)
Protein, ur: NEGATIVE mg/dL
SPECIFIC GRAVITY, URINE: 1.012 (ref 1.005–1.030)
SQUAMOUS EPITHELIAL / LPF: NONE SEEN

## 2017-12-02 LAB — GLUCOSE, CAPILLARY: GLUCOSE-CAPILLARY: 120 mg/dL — AB (ref 65–99)

## 2017-12-02 MED ORDER — CIPROFLOXACIN HCL 500 MG PO TABS
500.0000 mg | ORAL_TABLET | Freq: Two times a day (BID) | ORAL | Status: AC
  Administered 2017-12-02 – 2017-12-09 (×14): 500 mg via ORAL
  Filled 2017-12-02 (×16): qty 1

## 2017-12-02 MED ORDER — MIRTAZAPINE 15 MG PO TABS
15.0000 mg | ORAL_TABLET | Freq: Every day | ORAL | Status: DC
  Administered 2017-12-02 – 2017-12-17 (×16): 15 mg via ORAL
  Filled 2017-12-02 (×17): qty 1

## 2017-12-02 MED ORDER — CYPROHEPTADINE HCL 4 MG PO TABS
2.0000 mg | ORAL_TABLET | Freq: Three times a day (TID) | ORAL | Status: DC
  Administered 2017-12-03 – 2017-12-18 (×37): 2 mg via ORAL
  Filled 2017-12-02 (×51): qty 1

## 2017-12-02 NOTE — Progress Notes (Signed)
Received Julia Wheeler this AM after breakfast, she was compliant with her medications. She endorsed feeling depressed with a flat affect. She C/O low back pain with an odor upon urinating, a urine specimen was sent to the lab. She also C/O decreased appetite and stated she ate 25 % of her breakfast. Her AM weight was 153LB. She stated her normal weight is 165 lb and have loss since the 9th of this month.  Her appetite remains decreased at lunch and was only able to eat her jello. She consumed her midday protein drink and have one scheduled at 1400 hrs.  She refused dinner and was given her protein drink earlier.

## 2017-12-02 NOTE — Progress Notes (Signed)
Patient is calm and cooperative, compliance with her medication and participating in regular scheduled activities, patient continue to endorse for depression that is occurring more frequently without manic episode, patient contract for safety of self and others , support and encouragement is rendered and acknowledged. Patient is achieving long hours of restful  Sleep, no signs of auditory or visual hallucinations noted, respiratory is regular and stable no distress.
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
## 2017-12-02 NOTE — [facility] Group Notes (Signed)
LCSW Group Therapy Note 12/02/2017 1:15pm  Type of Therapy and Topic: Group Therapy: Feelings Around Returning Home & Establishing a Supportive Framework and Supporting Oneself When Supports Not Available  Participation Level: Did Not Attend  Description of Group:  Patients first processed thoughts and feelings about upcoming discharge. These included fears of upcoming changes, lack of change, new living environments, judgements and expectations from others and overall stigma of mental health issues. The group then discussed the definition of a supportive framework, what that looks and feels like, and how do to discern it from an unhealthy non-supportive network. The group identified different types of supports as well as what to do when your family/friends are less than helpful or unavailable  Therapeutic Goals  1. Patient will identify one healthy supportive network that they can use at discharge. 2. Patient will identify one factor of a supportive framework and how to tell it from an unhealthy network. 3. Patient able to identify one coping skill to use when they do not have positive supports from others. 4. Patient will demonstrate ability to communicate their needs through discussion and/or role plays.  Summary of Patient Progress:    Therapeutic Modalities Cognitive Behavioral Therapy Motivational Interviewing   Julia Wheeler  [name], LCSW 12/02/2017 11:22 AM

## 2017-12-03 ENCOUNTER — Inpatient Hospital Stay: Payer: No Typology Code available for payment source | Admitting: Certified Registered Nurse Anesthetist

## 2017-12-03 ENCOUNTER — Inpatient Hospital Stay: Payer: No Typology Code available for payment source

## 2017-12-03 DIAGNOSIS — F333 Major depressive disorder, recurrent, severe with psychotic symptoms: Principal | ICD-10-CM

## 2017-12-03 LAB — GLUCOSE, CAPILLARY
Glucose-Capillary: 120 mg/dL — ABNORMAL HIGH (ref 65–99)
Glucose-Capillary: 130 mg/dL — ABNORMAL HIGH (ref 65–99)

## 2017-12-03 MED ORDER — SODIUM CHLORIDE 0.9 % IV SOLN
500.0000 mL | Freq: Once | INTRAVENOUS | Status: AC
  Administered 2017-12-03: 500 mL via INTRAVENOUS

## 2017-12-03 MED ORDER — ONDANSETRON HCL 4 MG/2ML IJ SOLN
INTRAMUSCULAR | Status: AC
  Administered 2017-12-03: 4 mg via INTRAVENOUS
  Filled 2017-12-03: qty 2

## 2017-12-03 MED ORDER — POLYETHYLENE GLYCOL 3350 17 G PO PACK
17.0000 g | PACK | Freq: Every day | ORAL | Status: DC
  Administered 2017-12-03 – 2017-12-17 (×4): 17 g via ORAL
  Filled 2017-12-03 (×9): qty 1

## 2017-12-03 MED ORDER — KETOROLAC TROMETHAMINE 30 MG/ML IJ SOLN
30.0000 mg | Freq: Once | INTRAMUSCULAR | Status: AC
  Administered 2017-12-03: 30 mg via INTRAVENOUS

## 2017-12-03 MED ORDER — SUCCINYLCHOLINE CHLORIDE 20 MG/ML IJ SOLN
INTRAMUSCULAR | Status: DC | PRN
  Administered 2017-12-03: 80 mg via INTRAVENOUS

## 2017-12-03 MED ORDER — PANTOPRAZOLE SODIUM 40 MG IV SOLR
40.0000 mg | INTRAVENOUS | Status: DC
  Filled 2017-12-03: qty 40

## 2017-12-03 MED ORDER — METHOHEXITAL SODIUM 100 MG/10ML IV SOSY
PREFILLED_SYRINGE | INTRAVENOUS | Status: DC | PRN
  Administered 2017-12-03: 60 mg via INTRAVENOUS

## 2017-12-03 MED ORDER — KETOROLAC TROMETHAMINE 30 MG/ML IJ SOLN
INTRAMUSCULAR | Status: AC
  Administered 2017-12-03: 30 mg via INTRAVENOUS
  Filled 2017-12-03: qty 1

## 2017-12-03 MED ORDER — DOCUSATE SODIUM 100 MG PO CAPS
100.0000 mg | ORAL_CAPSULE | Freq: Two times a day (BID) | ORAL | Status: DC
  Administered 2017-12-04 – 2017-12-18 (×24): 100 mg via ORAL
  Filled 2017-12-03 (×25): qty 1

## 2017-12-03 MED ORDER — PANTOPRAZOLE SODIUM 40 MG PO TBEC
40.0000 mg | DELAYED_RELEASE_TABLET | Freq: Two times a day (BID) | ORAL | Status: DC
  Administered 2017-12-03 – 2017-12-18 (×29): 40 mg via ORAL
  Filled 2017-12-03 (×29): qty 1

## 2017-12-03 MED ORDER — ONDANSETRON HCL 4 MG/2ML IJ SOLN
4.0000 mg | Freq: Once | INTRAMUSCULAR | Status: AC
  Administered 2017-12-03: 4 mg via INTRAVENOUS

## 2017-12-03 MED ORDER — SODIUM CHLORIDE 0.9 % IV SOLN
INTRAVENOUS | Status: DC | PRN
  Administered 2017-12-03: 11:00:00 via INTRAVENOUS

## 2017-12-03 MED ORDER — FLEET ENEMA 7-19 GM/118ML RE ENEM
1.0000 | ENEMA | Freq: Once | RECTAL | Status: AC
  Administered 2017-12-03: 1 via RECTAL

## 2017-12-03 NOTE — Progress Notes (Signed)
D:Pt denies SI/HI/AVH. Pt. Verbally contracts for safety and states she can remain free from injury. Pt is pleasant and cooperative, but appears depressed upon presentation. Pt. Complains of generalized pain she states is from her UTI. Will update treatment team in the morning during hand-off report of generalized pain. Pt. Reports sleeping, "alright", but appetite is, "not good". Pt. Reports she feels, "worse" today then she did yesterday. Pt. Refused ensure this evening, because they, "hurt my stomach". Will work with treatment team to find alternative.  A: Q x 15 minute observation checks were completed for safety. Patient was provided with education. Pt. Verbalizes understanding of provided education.Patient was given scheduled/PRN medications. Patient  was encourage to attend groups, participate in unit activities and continue with plan of care.   R:Patient is complaint with medication and unit procedures, but Pt. Does not go to groups. Pt. Isolative and withdrawn.               Patient slept for Estimated Hours of 7.30; Precautionary checks every 15 minutes for safety maintained, room free of safety hazards, patient sustains no injury or falls during this shift.

## 2017-12-03 NOTE — Anesthesia Postprocedure Evaluation (Signed)
Anesthesia Post Note  Patient: Julia Wheeler  Procedure(s) Performed: ECT TX  Patient location during evaluation: PACU Anesthesia Type: General Level of consciousness: awake and alert Pain management: pain level controlled Vital Signs Assessment: post-procedure vital signs reviewed and stable Respiratory status: spontaneous breathing, nonlabored ventilation and respiratory function stable Cardiovascular status: blood pressure returned to baseline and stable Postop Assessment: no signs of nausea or vomiting Anesthetic complications: no     Last Vitals:  Vitals:   12/03/17 1137 12/03/17 1147  BP: 102/83 113/77  Pulse: (!) 102 98  Resp: 18 20  Temp:    SpO2: 95% 100%    Last Pain:  Vitals:   12/03/17 1107  TempSrc:   PainSc: Asleep                 Jeffery Bachmeier

## 2017-12-03 NOTE — Anesthesia Post-op Follow-up Note (Signed)
Anesthesia QCDR form completed.        

## 2017-12-03 NOTE — [facility] Group Notes (Signed)
[facility] Group Notes:  (Nursing/MHT/Case Management/Adjunct)  Date:  12/03/2017  Time:  3:10 PM  Type of Therapy:  Psychoeducational Skills  Participation Level:  Did Not Attend  Lynelle Smoke [facility] 12/03/2017, 3:10 PM
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
## 2017-12-03 NOTE — Procedures (Signed)
ECT SERVICES Physician's Interval Evaluation & Treatment Note  Patient Identification: Julia Wheeler MRN:  [id number] Date of Evaluation:  12/03/2017 TX #: 2  MADRS:   MMSE:   P.E. Findings:  No change to physical exam except that she is complaining of abdominal pain  Psychiatric Interval Note:  Patient is still very depressed down and flat  Subjective:  Patient is a 52 y.o. female seen for evaluation for Electroconvulsive Therapy. Depressed.  Abdominal pain.  Treatment Summary:   []   Right Unilateral             [x]  Bilateral   % Energy : 1.0 ms 25%   Impedance: 1680 ohms  Seizure Energy Index: 24,459 V squared  Postictal Suppression Index: No reading  Seizure Concordance Index: 88%  Medications  Pre Shock: Zofran 4 mg Brevital 60 mg succinylcholine 80 mg  Post Shock:    Seizure Duration: 42 seconds by EMG 62 seconds by EEG   Comments: Continue 3 times a week with Next treatment Friday  Lungs:  [x]   Clear to auscultation               []  Other:   Heart:    [x]   Regular rhythm             []  irregular rhythm    [x]   Previous H&P reviewed, patient examined and there are NO CHANGES                 []   Previous H&P reviewed, patient examined and there are changes noted.   Mordecai Rasmussen, MD 1/21/201910:51 AM

## 2017-12-03 NOTE — Transfer of Care (Signed)
Immediate Anesthesia Transfer of Care Note  Patient: Julia Wheeler  Procedure(s) Performed: ECT TX  Patient Location: PACU  Anesthesia Type:General  Level of Consciousness: sedated  Airway & Oxygen Therapy: Patient Spontanous Breathing and Patient connected to face mask oxygen  Post-op Assessment: Report given to RN and Post -op Vital signs reviewed and stable  Post vital signs: Reviewed and stable  Last Vitals:  Vitals:   12/03/17 0625 12/03/17 0916  BP: (!) 124/91 (!) 130/92  Pulse: (!) 107 96  Resp:  16  Temp:  37.3 C  SpO2:      Last Pain:  Vitals:   12/03/17 0916  TempSrc: Oral  PainSc:       Patients Stated Pain Goal: 3 (12/03/17 0006)  Complications: No apparent anesthesia complications
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
## 2017-12-03 NOTE — Anesthesia Procedure Notes (Signed)
Performed by: Daryl Beehler, CRNA Pre-anesthesia Checklist: Patient identified, Patient being monitored, Timeout performed, Emergency Drugs available and Suction available Patient Re-evaluated:Patient Re-evaluated prior to induction Oxygen Delivery Method: Circle system utilized Preoxygenation: Pre-oxygenation with 100% oxygen Ventilation: Mask ventilation without difficulty Airway Equipment and Method: Bite block Dental Injury: Teeth and Oropharynx as per pre-operative assessment        
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
## 2017-12-03 NOTE — [facility] Group Notes (Signed)
12/03/2017 9:30AM  Type of Therapy and Topic:  Group Therapy:  Overcoming Obstacles  Participation Level:  Did Not Attend    Description of Group:    In this group patients will be encouraged to explore what they see as obstacles to their own wellness and recovery. They will be guided to discuss their thoughts, feelings, and behaviors related to these obstacles. The group will process together ways to cope with barriers, with attention given to specific choices patients can make. Each patient will be challenged to identify changes they are motivated to make in order to overcome their obstacles. This group will be process-oriented, with patients participating in exploration of their own experiences as well as giving and receiving support and challenge from other group members.   Therapeutic Goals: 1. Patient will identify personal and current obstacles as they relate to admission. 2. Patient will identify barriers that currently interfere with their wellness or overcoming obstacles.  3. Patient will identify feelings, thought process and behaviors related to these barriers. 4. Patient will identify two changes they are willing to make to overcome these obstacles:      Summary of Patient Progress Patient was encouraged and invited to attend group. Patient did not attend group. Social worker will continue to encourage group participation in the future.     Therapeutic Modalities:   Cognitive Behavioral Therapy Solution Focused Therapy Motivational Interviewing Relapse Prevention Therapy    [name] MSW, LCSWA 12/03/2017 10:38 AM

## 2017-12-03 NOTE — Plan of Care (Signed)
Pt. Verbally contracts for safety and states she can remain free from injury. Pt. Denies SI/HI. Pt. Verbalizes understanding of provided education. Pt. Compliant with medications. Pt. Does not go to groups. Pt. Isolative and withdrawn.  Progressing Education: Knowledge of [place] [facility] Education information/materials will improve 12/03/2017 0040 - Progressing by Lenox Ponds, RN Medication: Compliance with prescribed medication regimen will improve 12/03/2017 0040 - Progressing by Lenox Ponds, RN Self-Concept: Ability to disclose and discuss suicidal ideas will improve 12/03/2017 0040 - Progressing by Lenox Ponds, RN Safety: Ability to remain free from injury will improve 12/03/2017 0040 - Progressing by Lenox Ponds, RN

## 2017-12-03 NOTE — Progress Notes (Signed)
Recreation Therapy Notes   Date: 01.21.2019  Time: 1:00 PM  Location: Craft Room  Behavioral response:N/A  Intervention Topic: Goals  Discussion/Intervention: Patient did not attend group.  Clinical Observations/Feedback:  Patient did not attend group.   Maeryn Mcgath LRT/CTRS           Danne Scardina 12/03/2017 2:21 PM 

## 2017-12-04 NOTE — Plan of Care (Signed)
  Progressing Coping: Ability to cope will improve 12/04/2017 2003 - Progressing by Lelan Pons, RN Self-Concept: Ability to disclose and discuss suicidal ideas will improve 12/04/2017 2003 - Progressing by Lelan Pons, RN Safety: Ability to remain free from injury will improve 12/04/2017 2003 - Progressing by Lelan Pons, RN [facility] Participation in Recreation Therapeutic Interventions [facility]-Other Recreation Therapy Goal (Specify) Description Patient will identify 3 triggers for depressive symptoms x5 days.   12/04/2017 2003 - Progressing by Lelan Pons, RN Activity: Sleeping patterns will improve 12/04/2017 2003 - Progressing by Lelan Pons, RN

## 2017-12-04 NOTE — Plan of Care (Signed)
  Education: Knowledge of [facility] Education information/materials will improve 12/04/2017 1035 - Progressing by Elige Radon, RN   Activity: Sleeping patterns will improve 12/04/2017 1035 - Progressing by Elige Radon, RN   Coping: Ability to cope will improve 12/04/2017 1035 - Progressing by Elige Radon, RN Note Starting to verbalize feelings more with staff.  Affect a little brighter although continues to rate depression as a 8/10.  Smiles with engagement now.     Medication: Compliance with prescribed medication regimen will improve 12/04/2017 1035 - Progressing by Elige Radon, RN Note Medication compliant   Self-Concept: Ability to disclose and discuss suicidal ideas will improve 12/04/2017 1035 - Progressing by Elige Radon, RN Note Denies SI or any thoughts of self hame Ability to verbalize positive feelings about self will improve 12/04/2017 1035 - Not Progressing by Elige Radon, RN Note Unable to verbalize any positive feelings at this time.  Fixated on the negative   Safety: Ability to remain free from injury will improve 12/04/2017 1035 by Elige Radon, RN Note Remains safe on the unit   Spiritual Needs Ability to function at adequate level 12/04/2017 1035 - Progressing by Elige Radon, RN Note Maintains personal care chores.  Attends groups.  Up to day room for meal although appetite is poor.   Minimal interaction with peers.

## 2017-12-04 NOTE — Progress Notes (Signed)
12/04/2017  Subjective: Patient had miralax and enema yesterday with no bowel movement but is now having flatus.  Her upper abdominal pain persists today.  Protonix po bid started yesterday too.  No nausea or vomiting.  Vital signs: Temp:  [98.6 F (37 C)] 98.6 F (37 C) (01/22 0634) Pulse Rate:  [109] 109 (01/22 0634) Resp:  [18] 18 (01/22 0634) BP: (112)/(77) 112/77 (01/22 0634) SpO2:  [100 %] 100 % (01/22 4270)   Intake/Output: 01/21 0701 - 01/22 0700 In: 780 [P.O.:480; I.V.:300] Out: -  Last BM Date: 12/03/17(small)  Physical Exam: Constitutional: No acute distress Abdomen:  Soft, nondistended, with tenderness to palpation over epigastric area.  No peritoneal signs.  Labs:  No results for input(s): WBC, HGB, HCT, PLT in the last 72 hours. No results for input(s): NA, K, CL, CO2, GLUCOSE, BUN, CREATININE, CALCIUM in the last 72 hours.  Invalid input(s): MAGNESIUM No results for input(s): LABPROT, INR in the last 72 hours.  Imaging: No results found.  Assessment/Plan: 52 yo female with abdominal pain.  --at this point I dont believe this is related to intussusception.  Her clinical status would have continued to deteriorate if that were the case.  I think given her history of gastric bypass, the fact that she was not on an antacid at home, and history of marginal ulcers, this is more likely the reason for epigastric pain. --will place consult for Gastroenterology to further evaluate. --no acute surgical needs.  Will sign off for now.  Please feel free to call or consult if any other needs.   Howie Ill, MD [facility] ASCOM 7a-7p: [phone] ASCOM 7p-7a: [phone]

## 2017-12-04 NOTE — Progress Notes (Signed)
Recreation Therapy Notes  Date: 01.22.2019  Time: 1:00 PM  Location: Craft Room  Behavioral response: N/A   Intervention Topic: Creative Expressions  Discussion/Intervention: Patient did not attend group. Clinical Observations/Feedback:  Patient did not attend group.  Hamid Brookens LRT/CTRS         Therron Sells 12/04/2017 2:09 PM

## 2017-12-04 NOTE — [facility] Group Notes (Signed)
[facility] Group Notes:  (Nursing/MHT/Case Management/Adjunct)  Date:  12/04/2017  Time:  11:19 PM  Type of Therapy:  Group Therapy  Participation Level:  Did Not Attend   Mayra Neer 12/04/2017, 11:19 PM

## 2017-12-04 NOTE — [facility] Group Notes (Signed)
12/04/2017 0930  Type of Therapy/Topic:  Group Therapy:  Feelings about Diagnosis  Participation Level:  Minimal   Description of Group:   This group will allow patients to explore their thoughts and feelings about diagnoses they have received. Patients will be guided to explore their level of understanding and acceptance of these diagnoses. Facilitator will encourage patients to process their thoughts and feelings about the reactions of others to their diagnosis and will guide patients in identifying ways to discuss their diagnosis with significant others in their lives. This group will be process-oriented, with patients participating in exploration of their own experiences, giving and receiving support, and processing challenge from other group members.   Therapeutic Goals: 1. Patient will demonstrate understanding of diagnosis as evidenced by identifying two or more symptoms of the disorder 2. Patient will be able to express two feelings regarding the diagnosis 3. Patient will demonstrate their ability to communicate their needs through discussion and/or role play  Summary of Patient Progress: Pt attended group but did not participate in group discussion. Pt reported feeling sick today, "I'm not sure what's going on. I feel sick. Mentally, my depression and mood are the same as they have been." Pt did not participate in group discussions, and was observed rubbing her temples. Pt continues to work towards her tx goals but has not yet reached them.   Therapeutic Modalities:   Cognitive Behavioral Therapy Brief Therapy  Heidi Dach, MSW, LCSW 12/04/2017 10:25 AM

## 2017-12-04 NOTE — Progress Notes (Signed)
Patient ID: Julia Wheeler [name], female   DOB: 16-Jul-1966, 52 y.o.   MRN: [id number] Sad, quiet, withdrawn, pitiful, depressed, constipated (fleet enema, self-administered provided), bad reflux (protonix given), poor appetite (Boost Supplement provided); "I had ECT today, they did some work up and they can not find what is wrong with me...my stomach aches, " Denied SI/HI/AVH. Worried and anxious. Per Chart review CT scan indicated marginal ulcers as well as intussusception. KUB r/o SBO

## 2017-12-04 NOTE — Plan of Care (Signed)
Patient slept for Estimated Hours of 8.15; Precautionary checks every 15 minutes for safety maintained, room free of safety hazards, patient sustains no injury or falls during this shift.  
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
## 2017-12-05 ENCOUNTER — Inpatient Hospital Stay: Payer: No Typology Code available for payment source

## 2017-12-05 ENCOUNTER — Other Ambulatory Visit: Payer: Self-pay | Admitting: Psychiatry

## 2017-12-05 ENCOUNTER — Inpatient Hospital Stay: Payer: No Typology Code available for payment source | Admitting: Anesthesiology

## 2017-12-05 LAB — GLUCOSE, CAPILLARY
GLUCOSE-CAPILLARY: 106 mg/dL — AB (ref 65–99)
GLUCOSE-CAPILLARY: 99 mg/dL (ref 65–99)

## 2017-12-05 MED ORDER — ONDANSETRON HCL 4 MG/2ML IJ SOLN
INTRAMUSCULAR | Status: AC
  Administered 2017-12-05: 4 mg via INTRAVENOUS
  Filled 2017-12-05: qty 2

## 2017-12-05 MED ORDER — SODIUM CHLORIDE 0.9 % IV SOLN
500.0000 mL | Freq: Once | INTRAVENOUS | Status: AC
  Administered 2017-12-05: 500 mL via INTRAVENOUS

## 2017-12-05 MED ORDER — ONDANSETRON HCL 4 MG/2ML IJ SOLN
4.0000 mg | Freq: Once | INTRAMUSCULAR | Status: AC
  Administered 2017-12-05: 4 mg via INTRAVENOUS

## 2017-12-05 MED ORDER — METHOHEXITAL SODIUM 100 MG/10ML IV SOSY
PREFILLED_SYRINGE | INTRAVENOUS | Status: DC | PRN
  Administered 2017-12-05: 60 mg via INTRAVENOUS

## 2017-12-05 MED ORDER — SUCCINYLCHOLINE CHLORIDE 200 MG/10ML IV SOSY
PREFILLED_SYRINGE | INTRAVENOUS | Status: DC | PRN
  Administered 2017-12-05: 80 mg via INTRAVENOUS

## 2017-12-05 MED ORDER — BOOST / RESOURCE BREEZE PO LIQD CUSTOM
2.0000 | Freq: Three times a day (TID) | ORAL | Status: DC
  Administered 2017-12-05: 21:00:00 via ORAL
  Administered 2017-12-06 – 2017-12-18 (×26): 2 via ORAL

## 2017-12-05 MED ORDER — SODIUM CHLORIDE 0.9 % IV SOLN
INTRAVENOUS | Status: DC | PRN
  Administered 2017-12-05: 10:00:00 via INTRAVENOUS

## 2017-12-05 MED ORDER — KETOROLAC TROMETHAMINE 30 MG/ML IJ SOLN
30.0000 mg | Freq: Once | INTRAMUSCULAR | Status: AC
  Administered 2017-12-05: 30 mg via INTRAVENOUS

## 2017-12-05 MED ORDER — KETOROLAC TROMETHAMINE 30 MG/ML IJ SOLN
INTRAMUSCULAR | Status: AC
  Administered 2017-12-05: 30 mg via INTRAVENOUS
  Filled 2017-12-05: qty 1
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
## 2017-12-05 NOTE — [facility] Group Notes (Signed)
12/05/2017 9:30AM Type of Therapy/Topic:  Group Therapy:  Emotion Regulation  Participation Level:  Did Not Attend   Description of Group:   The purpose of this group is to assist patients in learning to regulate negative emotions and experience positive emotions. Patients will be guided to discuss ways in which they have been vulnerable to their negative emotions. These vulnerabilities will be juxtaposed with experiences of positive emotions or situations, and patients will be challenged to use positive emotions to combat negative ones. Special emphasis will be placed on coping with negative emotions in conflict situations, and patients will process healthy conflict resolution skills.  Therapeutic Goals: 1. Patient will identify two positive emotions or experiences to reflect on in order to balance out negative emotions 2. Patient will label two or more emotions that they find the most difficult to experience 3. Patient will demonstrate positive conflict resolution skills through discussion and/or role plays  Summary of Patient Progress: Patient was encouraged and invited to attend group. Patient did not attend group. Social worker will continue to encourage group participation in the future.       Therapeutic Modalities:   Cognitive Behavioral Therapy Feelings Identification Dialectical Behavioral Therapy   [name], LCSW 12/05/2017 10:24 AM

## 2017-12-05 NOTE — Progress Notes (Signed)
Affect is brighter and patient stated feeling better as she is seen in the milieu area, less interaction with peers, sitting quiet most of the time, no complain from post ECT procedure, patient contract for safety of self and others, patient is monitored evry 15 minutes for safety, no side effect from mediations, patient denies SI/HI and AVH at this time, patient is safe in the unit.[name]

## 2017-12-05 NOTE — Progress Notes (Addendum)
Patient found in day room upon my arrival. Patient attended group. Reports feeling "the same" as she did before ECT. Continues to report depression. Denies SI, HI, AVH. Continues to report poor appetite. Reports she hates the food here and would prefer cold ham and cheese sandwiches to the meals she receives. Remains on Boost supplements. Reports voiding adequately. Denies pain. VSS. Compliant with HS medications and staff direction. Q 15 minute checks maintained. Will continue to monitor throughout the shift. Patient slept 6.5 hours. No distress noted. Will endorse care to oncoming shift.

## 2017-12-05 NOTE — Anesthesia Postprocedure Evaluation (Signed)
Anesthesia Post Note  Patient: Julia Wheeler  Procedure(s) Performed: ECT TX  Patient location during evaluation: PACU Anesthesia Type: General Level of consciousness: awake and alert Pain management: pain level controlled Vital Signs Assessment: post-procedure vital signs reviewed and stable Respiratory status: spontaneous breathing, nonlabored ventilation and respiratory function stable Cardiovascular status: blood pressure returned to baseline and stable Postop Assessment: no signs of nausea or vomiting Anesthetic complications: no     Last Vitals:  Vitals:   12/05/17 1123 12/05/17 1143  BP: 109/84 103/79  Pulse: 96 87  Resp: 19 15  Temp: 37.7 C 37.1 C  SpO2: 100% 96%    Last Pain:  Vitals:   12/05/17 1217  TempSrc:   PainSc: 0-No pain                 [place] Gustafson

## 2017-12-05 NOTE — Anesthesia Procedure Notes (Signed)
Date/Time: 12/05/2017 11:14 AM Performed by: Lily Kocher, CRNA Pre-anesthesia Checklist: Patient identified, Emergency Drugs available, Suction available and Patient being monitored Patient Re-evaluated:Patient Re-evaluated prior to induction Oxygen Delivery Method: Circle system utilized Preoxygenation: Pre-oxygenation with 100% oxygen Induction Type: IV induction Ventilation: Mask ventilation without difficulty and Mask ventilation throughout procedure Airway Equipment and Method: Bite block Placement Confirmation: positive ETCO2 Dental Injury: Teeth and Oropharynx as per pre-operative assessment

## 2017-12-05 NOTE — Transfer of Care (Signed)
Immediate Anesthesia Transfer of Care Note  Patient: Julia Wheeler  Procedure(s) Performed: ECT TX  Patient Location: PACU  Anesthesia Type:General  Level of Consciousness: awake and alert   Airway & Oxygen Therapy: Patient Spontanous Breathing and Patient connected to nasal cannula oxygen  Post-op Assessment: Report given to RN and Post -op Vital signs reviewed and stable  Post vital signs: Reviewed and stable  Last Vitals:  Vitals:   12/05/17 1123 12/05/17 1143  BP: 109/84 103/79  Pulse: 96 87  Resp: 19 15  Temp: 37.7 C 37.1 C  SpO2: 100% 96%    Last Pain:  Vitals:   12/05/17 1217  TempSrc:   PainSc: 0-No pain      Patients Stated Pain Goal: 3 (12/03/17 0006)  Complications: No apparent anesthesia complications

## 2017-12-05 NOTE — Plan of Care (Signed)
  Progressing Medication: Compliance with prescribed medication regimen will improve 12/05/2017 2212 - Progressing by Galen Manila, RN Self-Concept: Ability to disclose and discuss suicidal ideas will improve 12/05/2017 2212 - Progressing by Galen Manila, RN

## 2017-12-05 NOTE — Progress Notes (Signed)
Recreation Therapy Notes  Date: 01.23 .2018  Time: 3:00pm  Location: Craft room  Behavioral response: Appropriate  Group Type: Craft  Participation level: Active  Communication: Patient was social with peers and staff.  Comments: N/A  [name] LRT/CTRS        Julia Wheeler 12/05/2017 4:19 PM 
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
## 2017-12-05 NOTE — Tx Team (Signed)
Interdisciplinary Treatment and Diagnostic Plan Update  12/05/2017 Time of Session: 2:25PM Julia Wheeler MRN: [id number]  Principal Diagnosis: Severe recurrent major depression with psychotic features [facility])  Secondary Diagnoses: Principal Problem:   Severe recurrent major depression with psychotic features (HCC) Active Problems:   Sickle cell trait (HCC)   Nausea with vomiting   Essential hypertension   PTSD (post-traumatic stress disorder)   Alcohol use disorder, moderate, dependence (HCC)   Current Medications:  Current Facility-Administered Medications  Medication Dose Route Frequency Provider Last Rate Last Dose  . acetaminophen (TYLENOL) tablet 650 mg  650 mg Oral Q6H PRN Clapacs, Jackquline Denmark, MD   650 mg at 12/03/17 0006  . alum & mag hydroxide-simeth (MAALOX/MYLANTA) 200-200-20 MG/5ML suspension 30 mL  30 mL Oral Q4H PRN Clapacs, [name], MD   30 mL at 12/04/17 1717  . ARIPiprazole (ABILIFY) tablet 10 mg  10 mg Oral Daily Clapacs, Jackquline Denmark, MD   10 mg at 12/04/17 0849  . atorvastatin (LIPITOR) tablet 10 mg  10 mg Oral QHS Clapacs, Jackquline Denmark, MD   10 mg at 12/04/17 2147  . carvedilol (COREG) tablet 6.25 mg  6.25 mg Oral BID WC Clapacs, Jackquline Denmark, MD   6.25 mg at 12/05/17 0735  . ciprofloxacin (CIPRO) tablet 500 mg  500 mg Oral BID Beverly Sessions, MD   500 mg at 12/04/17 2147  . cyproheptadine (PERIACTIN) 4 MG tablet 2 mg  2 mg Oral TID Beverly Sessions, MD   2 mg at 12/04/17 1713  . docusate sodium (COLACE) capsule 100 mg  100 mg Oral BID Clapacs, Jackquline Denmark, MD   100 mg at 12/04/17 1713  . feeding supplement (BOOST / RESOURCE BREEZE) liquid 1 Container  1 Container Oral TID BM Clapacs, Jackquline Denmark, MD   1 Container at 12/04/17 2148  . ferrous sulfate tablet 325 mg  325 mg Oral Daily Clapacs, [name], MD   325 mg at 12/04/17 0850  . haloperidol (HALDOL) tablet 5 mg  5 mg Oral Q6H PRN Clapacs, [name], MD      . hydrOXYzine (ATARAX/VISTARIL) tablet 50 mg  50 mg Oral Q6H PRN Clapacs, Jackquline Denmark, MD    50 mg at 12/02/17 0233  . loratadine (CLARITIN) tablet 10 mg  10 mg Oral Daily Clapacs, Jackquline Denmark, MD   10 mg at 12/04/17 0850  . magnesium hydroxide (MILK OF MAGNESIA) suspension 30 mL  30 mL Oral Daily PRN Clapacs, Jackquline Denmark, MD   30 mL at 12/01/17 0852  . mirtazapine (REMERON) tablet 15 mg  15 mg Oral QHS Beverly Sessions, MD   15 mg at 12/04/17 2147  . multivitamin with minerals tablet 1 tablet  1 tablet Oral Daily Clapacs, Jackquline Denmark, MD   1 tablet at 12/04/17 0850  . pantoprazole (PROTONIX) EC tablet 40 mg  40 mg Oral BID Henrene Dodge, MD   40 mg at 12/04/17 2149  . polyethylene glycol (MIRALAX / GLYCOLAX) packet 17 g  17 g Oral Daily Piscoya, Jose, MD   17 g at 12/04/17 0849  . promethazine (PHENERGAN) tablet 12.5 mg  12.5 mg Oral Q6H PRN Clapacs, Jackquline Denmark, MD   12.5 mg at 11/29/17 1509  . sertraline (ZOLOFT) tablet 200 mg  200 mg Oral Daily Clapacs, Jackquline Denmark, MD   200 mg at 12/04/17 0850  . traZODone (DESYREL) tablet 150 mg  150 mg Oral QHS Clapacs, Jackquline Denmark, MD   150 mg at 12/04/17 2147  . triamterene-hydrochlorothiazide (  MAXZIDE-25) 37.5-25 MG per tablet 2 tablet  2 tablet Oral Daily Clapacs, Jackquline Denmark, MD   2 tablet at 12/05/17 5284   Facility-Administered Medications Ordered in Other Encounters  Medication Dose Route Frequency Provider Last Rate Last Dose  . 0.9 %  sodium chloride infusion    Continuous PRN Lily Kocher, CRNA      . methohexital Sodium   Intravenous Anesthesia Intra-op Lily Kocher, CRNA   60 mg at 12/05/17 1109  . succinylcholine (ANECTINE) syringe   Intravenous Anesthesia Intra-op Lily Kocher, CRNA   80 mg at 12/05/17 1110   PTA Medications: Medications Prior to Admission  Medication Sig Dispense Refill Last Dose  . acetaminophen (TYLENOL) 325 MG tablet Take 2 tablets (650 mg total) by mouth every 6 (six) hours as needed for mild pain.   12/02/2017 at Unknown time  . alum & mag hydroxide-simeth (MAALOX/MYLANTA) 200-200-20 MG/5ML suspension Take 30 mLs by mouth every 4  (four) hours as needed for indigestion. 355 mL 0 12/02/2017 at Unknown time  . ARIPiprazole (ABILIFY) 10 MG tablet Take 1 tablet (10 mg total) by mouth daily. For mood control   12/02/2017 at Unknown time  . atorvastatin (LIPITOR) 10 MG tablet Take 1 tablet (10 mg total) by mouth at bedtime. For high cholesterol   12/02/2017 at Unknown time  . carvedilol (COREG) 6.25 MG tablet Take 1 tablet (6.25 mg total) by mouth 2 (two) times daily with a meal. For high blood pressure   12/02/2017 at Unknown time  . cetirizine (ZYRTEC) 10 MG tablet Take 1 tablet (10 mg total) by mouth as needed for allergies.  2 12/02/2017 at Unknown time  . ferrous sulfate 325 (65 FE) MG tablet Take 1 tablet (325 mg total) by mouth daily. For anemia  1 12/02/2017 at Unknown time  . haloperidol (HALDOL) 5 MG tablet Take 1 tablet (5 mg total) by mouth every 6 (six) hours as needed for agitation (psychosis).   12/02/2017 at Unknown time  . hydrOXYzine (ATARAX/VISTARIL) 50 MG tablet Take 1 tablet (50 mg total) by mouth every 6 (six) hours as needed for anxiety. 30 tablet 0 12/02/2017 at Unknown time  . lamoTRIgine (LAMICTAL) 25 MG tablet Take 2 tablets (50 mg total) by mouth daily. For mood stabilization   12/02/2017 at Unknown time  . LORazepam (ATIVAN) 1 MG tablet Take 1 tablet (1 mg total) by mouth every 6 (six) hours as needed for anxiety or sleep. 1 tablet 0 12/02/2017 at Unknown time  . magnesium hydroxide (MILK OF MAGNESIA) 400 MG/5ML suspension Take 30 mLs by mouth daily as needed for mild constipation. 360 mL 0 12/02/2017 at Unknown time  . sertraline (ZOLOFT) 50 MG tablet Take 3 tablets (150 mg total) by mouth daily. For depression   12/02/2017 at Unknown time  . traZODone (DESYREL) 150 MG tablet Take 1 tablet (150 mg total) by mouth at bedtime. For sleep   12/02/2017 at Unknown time  . triamterene-hydrochlorothiazide (MAXZIDE-25) 37.5-25 MG tablet Take 2 tablets by mouth daily. For high blood pressure   12/02/2017 at Unknown time     Patient Stressors: Financial difficulties Medication change or noncompliance  Patient Strengths: Ability for insight Motivation for treatment/growth Supportive family/friends  Treatment Modalities: Medication Management, Group therapy, Case management,  1 to 1 session with clinician, Psychoeducation, Recreational therapy.   Physician Treatment Plan for Primary Diagnosis: Severe recurrent major depression with psychotic features (HCC) Long Term Goal(s): Improvement in symptoms so as ready for discharge Improvement in symptoms so  as ready for discharge   Short Term Goals: Ability to verbalize feelings will improve Ability to disclose and discuss suicidal ideas Ability to maintain clinical measurements within normal limits will improve Compliance with prescribed medications will improve  Medication Management: Evaluate patient's response, side effects, and tolerance of medication regimen.  Therapeutic Interventions: 1 to 1 sessions, Unit Group sessions and Medication administration.  Evaluation of Outcomes: Progressing  Physician Treatment Plan for Secondary Diagnosis: Principal Problem:   Severe recurrent major depression with psychotic features (HCC) Active Problems:   Sickle cell trait (HCC)   Nausea with vomiting   Essential hypertension   PTSD (post-traumatic stress disorder)   Alcohol use disorder, moderate, dependence (HCC)  Long Term Goal(s): Improvement in symptoms so as ready for discharge Improvement in symptoms so as ready for discharge   Short Term Goals: Ability to verbalize feelings will improve Ability to disclose and discuss suicidal ideas Ability to maintain clinical measurements within normal limits will improve Compliance with prescribed medications will improve     Medication Management: Evaluate patient's response, side effects, and tolerance of medication regimen.  Therapeutic Interventions: 1 to 1 sessions, Unit Group sessions and Medication  administration.  Evaluation of Outcomes: Progressing   RN Treatment Plan for Primary Diagnosis: Severe recurrent major depression with psychotic features (HCC) Long Term Goal(s): Knowledge of disease and therapeutic regimen to maintain health will improve  Short Term Goals: Ability to remain free from injury will improve, Ability to demonstrate self-control, Ability to verbalize feelings will improve, Ability to disclose and discuss suicidal ideas and Compliance with prescribed medications will improve  Medication Management: RN will administer medications as ordered by provider, will assess and evaluate patient's response and provide education to patient for prescribed medication. RN will report any adverse and/or side effects to prescribing provider.  Therapeutic Interventions: 1 on 1 counseling sessions, Psychoeducation, Medication administration, Evaluate responses to treatment, Monitor vital signs and CBGs as ordered, Perform/monitor CIWA, COWS, AIMS and Fall Risk screenings as ordered, Perform wound care treatments as ordered.  Evaluation of Outcomes: Progressing   LCSW Treatment Plan for Primary Diagnosis: Severe recurrent major depression with psychotic features (HCC) Long Term Goal(s): Safe transition to appropriate next level of care at discharge, Engage patient in therapeutic group addressing interpersonal concerns.  Short Term Goals: Engage patient in aftercare planning with referrals and resources, Increase social support, Increase ability to appropriately verbalize feelings, Increase emotional regulation, Identify triggers associated with mental health/substance abuse issues and Increase skills for wellness and recovery  Therapeutic Interventions: Assess for all discharge needs, 1 to 1 time with Social worker, Explore available resources and support systems, Assess for adequacy in community support network, Educate family and significant other(s) on suicide prevention, Complete  Psychosocial Assessment, Interpersonal group therapy.  Evaluation of Outcomes: Progressing   Progress in Treatment: Attending groups: Yes. Participating in groups: Yes. Taking medication as prescribed: Yes. Toleration medication: Yes. Family/Significant other contact made: Yes, individual(s) contacted:  [employer], [phone] Patient understands diagnosis: Yes. Discussing patient identified problems/goals with staff: Yes. Medical problems stabilized or resolved: Yes. Denies suicidal/homicidal ideation: Yes. Issues/concerns per patient self-inventory: Yes. Other:   New problem(s) identified: No, Describe:  None  New Short Term/Long Term Goal(s): "I want to understand why I tried to kill myself." Discharge Plan or Barriers: To return home to live with her son and attend outpatient treatment with [facility]  Reason for Continuation of Hospitalization: Depression Medication stabilization  Estimated Length of Stay: 7+ days  Recreational Therapy: Patient  Stressors: Depression Patient Goal: Patient will identify 3 triggers for depressive symptoms x5 days.    Attendees: Patient: Julia Wheeler 12/05/2017 11:55 AM  Physician: Mordecai Rasmussen, MD 12/05/2017 11:55 AM  Nursing:  12/05/2017 11:55 AM  RN Care Manager: 12/05/2017 11:55 AM  Social Worker: [name], LCSWA 12/05/2017 11:55 AM  Recreational Therapist: Danella Deis. Dreama Saa, LRT 12/05/2017 11:55 AM  Other: Heidi Dach, LCSW 12/05/2017 11:55 AM  Other:  12/05/2017 11:55 AM  Other: 12/05/2017 11:55 AM    Scribe for Treatment Team: [name], LCSW 12/05/2017 11:55 AM

## 2017-12-05 NOTE — Progress Notes (Signed)
Recreation Therapy Notes  Date: 01.23.2019  Time: 1:00 PM  Location: Craft Room  Behavioral response: Appropriate   Intervention Topic: Team work  Discussion/Intervention: Group content on today was focused on teamwork. The group identified what teamwork is. Individuals described who is a part of their team. Patients expressed why they thought teamwork is important. The group stated reasons why they thought it was easier to work with a Comptroller team. Individuals discussed some positives and negatives of working with a team. Patients gave examples of past experiences they had while working with a team. The group participated in the intervention "Save the The Crossings", patients were in groups and had to keep the balls on the surface given.  Clinical Observations/Feedback:  Patient came to group late due to unknown reasons. She stated her son is apart of her team. Individual expressed that working in a team means working with different people and different personalities. Patient explained that working as a team provides support. Individual participated in the intervention and continues to makes progress toward goals. Julia Wheeler LRT/CTRS         Julia Wheeler 12/05/2017 2:05 PM
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
## 2017-12-05 NOTE — Progress Notes (Signed)
Patient returned to unit after having ECT procedure done. Patient alert and oriented x 4. Ambulatory with a steady gait. No complaints of pain, food and po fluids provided to patient. Ate meal without difficulty, after meal pt observed resting in bed with eyes closed. Will continue monitor and notify MD of any changes in condition.

## 2017-12-05 NOTE — Anesthesia Post-op Follow-up Note (Signed)
Anesthesia QCDR form completed.        

## 2017-12-05 NOTE — Procedures (Signed)
ECT SERVICES Physician's Interval Evaluation & Treatment Note  Patient Identification: Julia Wheeler MRN:  [id number] Date of Evaluation:  12/05/2017 TX #: 3  MADRS:   MMSE:   P.E. Findings:  No change to physical exam  Psychiatric Interval Note:  Mood and affect still flat withdrawn and depressed  Subjective:  Patient is a 52 y.o. female seen for evaluation for Electroconvulsive Therapy. Depressed mood abdominal pain nausea lack of appetite  Treatment Summary:   []   Right Unilateral             [x]  Bilateral   % Energy : 1.0 ms 25%   Impedance: 1370 ohms  Seizure Energy Index: 12,615 V squared  Postictal Suppression Index: No reading  Seizure Concordance Index: 96%  Medications  Pre Shock: Zofran 4 mg Brevital 60 mg succinylcholine 80 mg  Post Shock:    Seizure Duration: EMG 32 seconds EEG 56 seconds   Comments: Follow-up Friday  Lungs:  [x]   Clear to auscultation               []  Other:   Heart:    [x]   Regular rhythm             []  irregular rhythm    [x]   Previous H&P reviewed, patient examined and there are NO CHANGES                 []   Previous H&P reviewed, patient examined and there are changes noted.   Mordecai Rasmussen, MD 1/23/201911:08 AM

## 2017-12-06 NOTE — [facility] Group Notes (Signed)
12/06/2017  Time: 0930  Type of Therapy/Topic:  Group Therapy:  Balance in Life  Participation Level:  Did Not Attend  Description of Group:   This group will address the concept of balance and how it feels and looks when one is unbalanced. Patients will be encouraged to process areas in their lives that are out of balance and identify reasons for remaining unbalanced. Facilitators will guide patients in utilizing problem-solving interventions to address and correct the stressor making their life unbalanced. Understanding and applying boundaries will be explored and addressed for obtaining and maintaining a balanced life. Patients will be encouraged to explore ways to assertively make their unbalanced needs known to significant others in their lives, using other group members and facilitator for support and feedback.  Therapeutic Goals: 1. Patient will identify two or more emotions or situations they have that consume much of in their lives. 2. Patient will identify signs/triggers that life has become out of balance:  3. Patient will identify two ways to set boundaries in order to achieve balance in their lives:  4. Patient will demonstrate ability to communicate their needs through discussion and/or role plays  Summary of Patient Progress: Pt was invited to attend group but chose not to attend. CSW will continue to encourage pt to attend group throughout their admission.    Therapeutic Modalities:   Cognitive Behavioral Therapy Solution-Focused Therapy Assertiveness Training   Heidi Dach, MSW, LCSW 12/06/2017 10:41 AM

## 2017-12-06 NOTE — Plan of Care (Signed)
  Progressing Education: Knowledge of [facility] Education information/materials will improve 12/06/2017 2020 - Progressing by Lelan Pons, RN Coping: Ability to cope will improve 12/06/2017 2020 - Progressing by Lelan Pons, RN Medication: Compliance with prescribed medication regimen will improve 12/06/2017 2020 - Progressing by Lelan Pons, RN Self-Concept: Ability to disclose and discuss suicidal ideas will improve 12/06/2017 2020 - Progressing by Lelan Pons, RN Activity: Sleeping patterns will improve 12/06/2017 2020 - Progressing by Lelan Pons, RN

## 2017-12-06 NOTE — B[facility] Group Notes (Signed)
LCSW Group Therapy Note 12/06/2017 9:00 AM  Type of Therapy and Topic:  Group Therapy:  Setting Goals  Participation Level:  Did Not Attend  Description of Group: In this process group, patients discussed using strengths to work toward goals and address challenges.  Patients identified two positive things about themselves and one goal they were working on.  Patients were given the opportunity to share openly and support each other's plan for self-empowerment.  The group discussed the value of gratitude and were encouraged to have a daily reflection of positive characteristics or circumstances.  Patients were encouraged to identify a plan to utilize their strengths to work on current challenges and goals.  Therapeutic Goals 1. Patient will verbalize personal strengths/positive qualities and relate how these can assist with achieving desired personal goals 2. Patients will verbalize affirmation of peers plans for personal change and goal setting 3. Patients will explore the value of gratitude and positive focus as related to successful achievement of goals 4. Patients will verbalize a plan for regular reinforcement of personal positive qualities and circumstances.  Summary of Patient Progress:       Therapeutic Modalities Cognitive Behavioral Therapy Motivational Interviewing    Alease Frame, [place] 12/06/2017 12:51 PM

## 2017-12-06 NOTE — Progress Notes (Signed)
Called dining services to verify that they had received the order for patient to have snack at 8 am, 2 pm and 8 pm.  Verified that they had.

## 2017-12-06 NOTE — Progress Notes (Signed)
Recreation Therapy Notes   Date: 01.24.2019  Time: 1:00 PM  Location: Craft Room  Behavioral response: Appropriate   Intervention Topic: Problem Solving  Discussion/Intervention: Group content on today was focused on problem solving. The group described what problem solving is. Patients expressed how problems affect them and how they deal with problems. Individuals identified healthy ways to deal with problems. Patients explained what normally happens to them when they do not deal with problems. The group expressed reoccurring problems for them. The group participated in the intervention "Ways to Solve problems" where patients were given a chance to explore different ways to solve problems.  Clinical Observations/Feedback:  Patient came to group and was focused on what peers and staff had to say about the topic at hand. Individual participated in the intervention and continues to make progress towards goals.   [name] LRT/CTRS          [name] 12/06/2017 2:06 PM

## 2017-12-06 NOTE — Plan of Care (Signed)
  Education: Knowledge of [facility] Education information/materials will improve 12/06/2017 1232 - Progressing by Elige Radon, RN   Activity: Sleeping patterns will improve 12/06/2017 1232 - Progressing by Elige Radon, RN   Coping: Ability to cope will improve 12/06/2017 1232 - Progressing by Elige Radon, RN Note Pleasant and cooperative.  Denies SI/HI/AVH.     Medication: Compliance with prescribed medication regimen will improve 12/06/2017 1232 - Progressing by Elige Radon, RN Note Compliant with medication regiment.      Self-Concept: Ability to disclose and discuss suicidal ideas will improve 12/06/2017 1232 - Progressing by Elige Radon, RN Note Denies SI or any thoughts of self harm in any way.   Ability to verbalize positive feelings about self will improve 12/06/2017 1232 - Not Progressing by Elige Radon, RN Note Unable to verbalize any positive feelings   Safety: Ability to remain free from injury will improve 12/06/2017 1232 - Progressing by Elige Radon, RN Note Remains safe on the unit   Spiritual Needs Ability to function at adequate level 12/06/2017 1232 - Progressing by Elige Radon, RN Note Attending groups.  Up to dayroom for meals although appetite is poor.  Verbalizes that the food is not good to her.   Well groomed.
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
## 2017-12-07 ENCOUNTER — Inpatient Hospital Stay: Payer: No Typology Code available for payment source | Admitting: Anesthesiology

## 2017-12-07 ENCOUNTER — Other Ambulatory Visit: Payer: Self-pay | Admitting: Psychiatry

## 2017-12-07 LAB — GLUCOSE, CAPILLARY: GLUCOSE-CAPILLARY: 92 mg/dL (ref 65–99)

## 2017-12-07 MED ORDER — SODIUM CHLORIDE 0.9 % IV SOLN
INTRAVENOUS | Status: DC | PRN
  Administered 2017-12-07: 09:00:00 via INTRAVENOUS

## 2017-12-07 MED ORDER — SUCCINYLCHOLINE CHLORIDE 200 MG/10ML IV SOSY
PREFILLED_SYRINGE | INTRAVENOUS | Status: DC | PRN
  Administered 2017-12-07: 80 mg via INTRAVENOUS

## 2017-12-07 MED ORDER — SODIUM CHLORIDE 0.9 % IV SOLN
500.0000 mL | Freq: Once | INTRAVENOUS | Status: AC
  Administered 2017-12-07: 500 mL via INTRAVENOUS

## 2017-12-07 MED ORDER — ONDANSETRON HCL 4 MG/2ML IJ SOLN
4.0000 mg | Freq: Once | INTRAMUSCULAR | Status: AC
  Administered 2017-12-07: 4 mg via INTRAVENOUS

## 2017-12-07 MED ORDER — METHOHEXITAL SODIUM 100 MG/10ML IV SOSY
PREFILLED_SYRINGE | INTRAVENOUS | Status: DC | PRN
  Administered 2017-12-07: 60 mg via INTRAVENOUS

## 2017-12-07 MED ORDER — ONDANSETRON HCL 4 MG/2ML IJ SOLN
INTRAMUSCULAR | Status: AC
  Administered 2017-12-07: 4 mg via INTRAVENOUS
  Filled 2017-12-07: qty 2

## 2017-12-07 MED ORDER — METHOHEXITAL SODIUM 0.5 G IJ SOLR
INTRAMUSCULAR | Status: AC
  Filled 2017-12-07: qty 500

## 2017-12-07 NOTE — [facility] Group Notes (Signed)
12/07/2017 9:30AM  Type of Therapy and Topic:  Group Therapy:  Feelings around Relapse and Recovery  Participation Level:  Did Not Attend   Description of Group:    Patients in this group will discuss emotions they experience before and after a relapse. They will process how experiencing these feelings, or avoidance of experiencing them, relates to having a relapse. Facilitator will guide patients to explore emotions they have related to recovery. Patients will be encouraged to process which emotions are more powerful. They will be guided to discuss the emotional reaction significant others in their lives may have to patients' relapse or recovery. Patients will be assisted in exploring ways to respond to the emotions of others without this contributing to a relapse.  Therapeutic Goals: 1. Patient will identify two or more emotions that lead to a relapse for them 2. Patient will identify two emotions that result when they relapse 3. Patient will identify two emotions related to recovery 4. Patient will demonstrate ability to communicate their needs through discussion and/or role plays   Summary of Patient Progress: Patient was encouraged and invited to attend group. Patient did not attend group. Social worker will continue to encourage group participation in the future.      Therapeutic Modalities:   Cognitive Behavioral Therapy Solution-Focused Therapy Assertiveness Training Relapse Prevention Therapy   [name], LCSW 12/07/2017 10:32 AM

## 2017-12-07 NOTE — Progress Notes (Signed)
Received Julia Wheeler from ECT, she ate approximately 50 of her lunch. She was given a Gatorade  After taking her VS abd her blood pressure was low. She continues to feel depressed and anxious=, but better since the start of her ECT. She denied feeling suicidal and no C/O stomach pains. She is OOB in the milieu, ambulating in the hallway at intervals. She C/O short term memory after the ECT. Dr Toni Amend in to assess patient. She only consumed her boost for dinner.

## 2017-12-07 NOTE — Procedures (Signed)
ECT SERVICES Physician's Interval Evaluation & Treatment Note  Patient Identification: Julia Wheeler MRN:  [id number] Date of Evaluation:  12/07/2017 TX #: 4  MADRS: 30  MMSE: 30 P.E. Findings:  No change physical exam  Psychiatric Interval Note:  Mood is a little bit better little more energetic not having suicidal thoughts  Subjective:  Patient is a 52 y.o. female seen for evaluation for Electroconvulsive Therapy. Feeling a little bit less anxious  Treatment Summary:   []   Right Unilateral             [x]  Bilateral   % Energy : 1.0 ms 25%   Impedance: 1450 ohms  Seizure Energy Index: No reading  Postictal Suppression Index: No reading  Seizure Concordance Index: 51%  Medications  Pre Shock: Zofran 4 mg Brevital 60 mg succinylcholine 80 mg  Post Shock:    Seizure Duration: 35 seconds by EMG 45 seconds by EEG   Comments: Follow-up Monday Wednesday and Friday next week  Lungs:  [x]   Clear to auscultation               []  Other:   Heart:    [x]   Regular rhythm             []  irregular rhythm    [x]   Previous H&P reviewed, patient examined and there are NO CHANGES                 []   Previous H&P reviewed, patient examined and there are changes noted.   Mordecai Rasmussen, MD 1/25/201910:48 AM

## 2017-12-07 NOTE — Progress Notes (Signed)
Recreation Therapy Notes  Date: 01.25.2019  Time: 1:00 PM  Location: Craft Room  Behavioral response: Appropriate   Intervention Topic: Time Management  Discussion/Intervention: Group content today was focused on time management. The group defined time management and identified healthy ways to manage time. Individuals expressed how much of the 24 hours they use in a day. Patients expressed how much time they use just for themselves personally. The group expressed how they have managed their time in the past. Individuals participated in the intervention "Managing Life" where they had a chance to see how much of the 24 hours they use and where it goes. Clinical Observations/Feedback:  Patient came to group and stated she uses 13 hours out of the 24 hour day and 3 hours are personally for her. She participated in the intervention and continues to make progress toward her goals.   Erikah Thumm LRT/CTRS         Navdeep Fessenden 12/07/2017 2:44 PM

## 2017-12-07 NOTE — Progress Notes (Signed)
   12/07/17 1500  Clinical Encounter Type  Visited With Patient  Visit Type Other (Comment) (group)  Spiritual Encounters  Spiritual Needs Emotional   Chaplain facilitated group in which patient participated.  Group involved communication skills and relaxation/visualization techniques. 

## 2017-12-07 NOTE — [facility] Group Notes (Signed)
[facility] Group Notes:  (Nursing/MHT/Case Management/Adjunct)  Date:  12/07/2017  Time:  3:02 PM  Type of Therapy:  Psychoeducational Skills  Participation Level:  Active  Participation Quality:  Appropriate  Affect:  Appropriate  Cognitive:  Appropriate  Insight:  Good  Engagement in Group:  Developing/Improving  Modes of Intervention:  Activity  Summary of Progress/Problems:  Julia Wheeler 12/07/2017, 3:02 PM

## 2017-12-07 NOTE — Progress Notes (Signed)
Patient remain on NPO for ECT in AM, sleep long hours with out any disturbances, affect is bright , 15 minute safety  Check is in progress no respiratory distress noted.

## 2017-12-07 NOTE — Anesthesia Procedure Notes (Signed)
Date/Time: 12/07/2017 10:55 AM Performed by: Lily Kocher, CRNA Pre-anesthesia Checklist: Patient identified, Emergency Drugs available, Suction available and Patient being monitored Patient Re-evaluated:Patient Re-evaluated prior to induction Oxygen Delivery Method: Circle system utilized Preoxygenation: Pre-oxygenation with 100% oxygen Induction Type: IV induction Ventilation: Mask ventilation without difficulty and Mask ventilation throughout procedure Airway Equipment and Method: Bite block Placement Confirmation: positive ETCO2 Dental Injury: Teeth and Oropharynx as per pre-operative assessment
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
## 2017-12-07 NOTE — Plan of Care (Signed)
  Progressing Education: Knowledge of [place] [place] [facility] Education information/materials will improve 12/07/2017 2235 - Progressing by Lelan Pons, RN Coping: Ability to cope will improve 12/07/2017 2235 - Progressing by Lelan Pons, RN Self-Concept: Ability to disclose and discuss suicidal ideas will improve 12/07/2017 2235 - Progressing by Lelan Pons, RN [facility] Participation in Recreation Therapeutic Interventions [facility]-Other Recreation Therapy Goal (Specify) Description Patient will identify 3 triggers for depressive symptoms x5 days.   12/07/2017 2235 - Progressing by Lelan Pons, RN Activity: Sleeping patterns will improve 12/07/2017 2235 - Progressing by Lelan Pons, RN

## 2017-12-07 NOTE — Anesthesia Postprocedure Evaluation (Signed)
Anesthesia Post Note  Patient: Julia Wheeler  Procedure(s) Performed: ECT TX  Patient location during evaluation: PACU Anesthesia Type: General Level of consciousness: awake and alert Pain management: pain level controlled Vital Signs Assessment: post-procedure vital signs reviewed and stable Respiratory status: spontaneous breathing, nonlabored ventilation and respiratory function stable Cardiovascular status: blood pressure returned to baseline and stable Postop Assessment: no signs of nausea or vomiting Anesthetic complications: no     Last Vitals:  Vitals:   12/07/17 1214 12/07/17 1259  BP: 115/81 98/73  Pulse: 99 (!) 113  Resp: 16 18  Temp: 36.9 C 36.5 C  SpO2: 97%     Last Pain:  Vitals:   12/07/17 1259  TempSrc: Oral  PainSc: 0-No pain                 Gethsemane Fischler

## 2017-12-07 NOTE — Transfer of Care (Signed)
Immediate Anesthesia Transfer of Care Note  Patient: Julia Wheeler  Procedure(s) Performed: ECT TX  Patient Location: PACU  Anesthesia Type:General  Level of Consciousness: sedated  Airway & Oxygen Therapy: Patient Spontanous Breathing and Patient connected to face mask oxygen  Post-op Assessment: Report given to RN and Post -op Vital signs reviewed and stable  Post vital signs: Reviewed and stable  Last Vitals:  Vitals:   12/07/17 0901 12/07/17 1105  BP: 107/78   Pulse: 86 87  Resp: 14 18  Temp: (!) 36.1 C 37.6 C  SpO2: 100% 95%    Last Pain:  Vitals:   12/07/17 1105  TempSrc:   PainSc: 0-No pain      Patients Stated Pain Goal: 3 (12/03/17 0006)  Complications: No apparent anesthesia complications
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
## 2017-12-07 NOTE — Anesthesia Post-op Follow-up Note (Signed)
Anesthesia QCDR form completed.        

## 2017-12-07 NOTE — [facility] Group Notes (Addendum)
[facility] Group Notes:  (Nursing/MHT/Case Management/Adjunct)  Date:  12/07/2017  Time:  7:04 AM  Type of Therapy:  Psychoeducational Skills  Participation Level:  Active  Summary of Progress/Problems: Prerna was engaged in group, and has improve with her participation.  Chancy Milroy 12/07/2017, 7:04 AM
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
## 2017-12-08 NOTE — Progress Notes (Signed)
Patient is responding well to treatment, her affects is getting brighter, no side effect or adverse reaction from ECT procedure sleeping long hours no distress noted.

## 2017-12-08 NOTE — [facility] Group Notes (Signed)
LCSW Group Therapy Note   12/08/2017 1:15pm   Type of Therapy and Topic:  Group Therapy:  Trust and Honesty  Participation Level:  Minimal  Description of Group:    In this group patients will be asked to explore the value of being honest.  Patients will be guided to discuss their thoughts, feelings, and behaviors related to honesty and trusting in others. Patients will process together how trust and honesty relate to forming relationships with peers, family members, and self. Each patient will be challenged to identify and express feelings of being vulnerable. Patients will discuss reasons why people are dishonest and identify alternative outcomes if one was truthful (to self or others). This group will be process-oriented, with patients participating in exploration of their own experiences, giving and receiving support, and processing challenge from other group members.   Therapeutic Goals: 1. Patient will identify why honesty is important to relationships and how honesty overall affects relationships.  2. Patient will identify a situation where they lied or were lied too and the  feelings, thought process, and behaviors surrounding the situation 3. Patient will identify the meaning of being vulnerable, how that feels, and how that correlates to being honest with self and others. 4. Patient will identify situations where they could have told the truth, but instead lied and explain reasons of dishonesty.   Summary of Patient Progress ; Pt scored her mood at 7 (10 best) she indicates that she does not know how she feels.     Therapeutic Modalities:   Cognitive Behavioral Therapy Solution Focused Therapy Motivational Interviewing Brief Therapy  Julia Wheeler  [name], LCSW 12/08/2017 1:02 PM

## 2017-12-08 NOTE — Progress Notes (Signed)
Received Julia Wheeler this am after her breakfast, she was compliant with her medications. She verbalized feeling less depressed and anxious this am. She denied feeling suicidal and hearing voices. Her appetite is increasing slowly. She returned to bed after breakfast.

## 2017-12-09 MED ORDER — BUSPIRONE HCL 10 MG PO TABS
10.0000 mg | ORAL_TABLET | Freq: Three times a day (TID) | ORAL | Status: DC | PRN
  Administered 2017-12-09: 10 mg via ORAL
  Filled 2017-12-09 (×2): qty 1

## 2017-12-09 NOTE — Progress Notes (Signed)
Received Julia Wheeler this am after her breakfast, she was compliant with her medications. She remained visible in the milieu, noted increased anxiety with shaking of her legs. She has been stationary at the table throughout themorning. Her appetite is increasing, but she continues to have short term memory. Her anxiety decreased this PM. She received her first dose of Buspar.

## 2017-12-09 NOTE — [facility] Group Notes (Signed)
[facility] Group Notes:  (Nursing/MHT/Case Management/Adjunct)  Date:  12/08/2017 Time:  8:47 PM  Type of Therapy:  Psychoeducational Skills  Participation Level:  Active  Participation Quality:  Appropriate and Attentive  Affect:  Appropriate  Cognitive:  Appropriate  Insight:  Appropriate  Engagement in Group:  Engaged  Modes of Intervention:  Discussion, Socialization and Support  Summary of Progress/Problems:  Julia Wheeler 12/09/2017, 8:25 PM

## 2017-12-09 NOTE — Plan of Care (Signed)
  Progressing Education: Knowledge of Story General Education information/materials will improve 12/09/2017 2153 - Progressing by Lelan Pons, RN Coping: Ability to cope will improve 12/09/2017 2153 - Progressing by Lelan Pons, RN Self-Concept: Ability to disclose and discuss suicidal ideas will improve 12/09/2017 2153 - Progressing by Lelan Pons, RN Safety: Ability to remain free from injury will improve 12/09/2017 2153 - Progressing by Lelan Pons, RN [facility] Participation in Recreation Therapeutic Interventions STG-Other Recreation Therapy Goal (Specify) Description Patient will identify 3 triggers for depressive symptoms x5 days.   12/09/2017 2153 - Progressing by Lelan Pons, RN Activity: Sleeping patterns will improve 12/09/2017 2153 - Progressing by Lelan Pons, RN

## 2017-12-09 NOTE — [facility] Group Notes (Signed)
[facility] Group Notes:  (Nursing/MHT/Case Management/Adjunct)  Date:  12/09/2017  Time:  11:40 PM  Type of Therapy:  Group Therapy  Participation Level:  Active  Participation Quality:  Attentive  Affect:  Appropriate  Cognitive:  Appropriate  Insight:  Appropriate  Engagement in Group:  Engaged  Modes of Intervention:  Discussion  Summary of Progress/Problems:  Julia Wheeler 12/09/2017, 11:40 PM
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
## 2017-12-09 NOTE — Plan of Care (Signed)
Pt. Verbalizes understanding of provided education. Pt. Reports feeling, "better" today then she did yesterday. Pt. Compliant with medications. Pt. Denies SI/HI. Pt. States she can remain safe while on the unit. Pt. Participates in groups. Pt. Reports sleeping, "good" and appetite has been, "improving".  Progressing Education: Knowledge of [place] General Education information/materials will improve 12/09/2017 0028 - Progressing by Lenox Ponds, RN Coping: Ability to cope will improve 12/09/2017 0028 - Progressing by Lenox Ponds, RN Medication: Compliance with prescribed medication regimen will improve 12/09/2017 0028 - Progressing by Lenox Ponds, RN Self-Concept: Ability to disclose and discuss suicidal ideas will improve 12/09/2017 0028 - Progressing by Lenox Ponds, RN Safety: Ability to remain free from injury will improve 12/09/2017 0028 - Progressing by Lenox Ponds, RN [facility] Participation in Recreation Therapeutic Interventions ST[facility]-Other Recreation Therapy Goal (Specify) Description Patient will identify 3 triggers for depressive symptoms x5 days.   12/09/2017 0028 - Progressing by Lenox Ponds, RN Activity: Sleeping patterns will improve 12/09/2017 0028 - Progressing by Lenox Ponds, RN

## 2017-12-09 NOTE — [facility] Group Notes (Signed)
LCSW Group Therapy Note 12/09/2017 1:15pm  Type of Therapy and Topic: Group Therapy: Feelings Around Returning Home & Establishing a Supportive Framework and Supporting Oneself When Supports Not Available  Participation Level: Active  Description of Group:  Patients first processed thoughts and feelings about upcoming discharge. These included fears of upcoming changes, lack of change, new living environments, judgements and expectations from others and overall stigma of mental health issues. The group then discussed the definition of a supportive framework, what that looks and feels like, and how do to discern it from an unhealthy non-supportive network. The group identified different types of supports as well as what to do when your family/friends are less than helpful or unavailable  Therapeutic Goals  1. Patient will identify one healthy supportive network that they can use at discharge. 2. Patient will identify one factor of a supportive framework and how to tell it from an unhealthy network. 3. Patient able to identify one coping skill to use when they do not have positive supports from others. 4. Patient will demonstrate ability to communicate their needs through discussion and/or role plays.  Summary of Patient Progress:  Pt engaged during group session. As patients processed their anxiety about discharge and described healthy supports patient shared that she is here for a reason and is not ready to leave yet. Pt reported feeling better. Patients identified at least one self-care tool they were willing to use after discharge.   Therapeutic Modalities Cognitive Behavioral Therapy Motivational Interviewing   Mescal Flinchbaugh  [name], LCSW 12/09/2017 1:09 PM

## 2017-12-09 NOTE — Progress Notes (Signed)
D:Pt denies SI/HI/AVH. Pt. States she can remain safe while on the unit. Pt is pleasant and cooperative during assessment. Pt. reports anxiety and depression at a 6.  Patient interaction is engaging. Pt. Reports feeling, "better" today then she did yesterday. Pt. Reports sleeping, "good" and appetite has been, "improving".    A: Q x 15 minute observation checks were completed for safety. Patient was provided with education. Pt. Verbalizes understanding of provided education. Patient was given scheduled/PRN medications. Patient  was encourage to attend groups, participate in unit activities and continue with plan of care.   R:Patient is complaint with medication and unit procedures. Pt. Participates in groups.            Patient slept for Estimated Hours of 7.15; Precautionary checks every 15 minutes for safety maintained, room free of safety hazards, patient sustains no injury or falls during this shift.

## 2017-12-10 ENCOUNTER — Inpatient Hospital Stay: Payer: No Typology Code available for payment source | Admitting: Anesthesiology

## 2017-12-10 ENCOUNTER — Other Ambulatory Visit: Payer: Self-pay | Admitting: Psychiatry

## 2017-12-10 LAB — GLUCOSE, CAPILLARY: GLUCOSE-CAPILLARY: 83 mg/dL (ref 65–99)

## 2017-12-10 MED ORDER — SODIUM CHLORIDE 0.9 % IV SOLN
INTRAVENOUS | Status: DC | PRN
  Administered 2017-12-10: 11:00:00 via INTRAVENOUS

## 2017-12-10 MED ORDER — SUCCINYLCHOLINE CHLORIDE 20 MG/ML IJ SOLN
INTRAMUSCULAR | Status: AC
  Filled 2017-12-10: qty 1

## 2017-12-10 MED ORDER — ONDANSETRON HCL 4 MG/2ML IJ SOLN: 4.0000 mg | Freq: Once | INTRAMUSCULAR | Status: DC | PRN

## 2017-12-10 MED ORDER — SUCCINYLCHOLINE CHLORIDE 20 MG/ML IJ SOLN
INTRAMUSCULAR | Status: DC | PRN
  Administered 2017-12-10: 80 mg via INTRAVENOUS

## 2017-12-10 MED ORDER — ONDANSETRON HCL 4 MG/2ML IJ SOLN
4.0000 mg | Freq: Once | INTRAMUSCULAR | Status: DC
  Filled 2017-12-10: qty 2

## 2017-12-10 MED ORDER — SODIUM CHLORIDE 0.9 % IV SOLN
500.0000 mL | Freq: Once | INTRAVENOUS | Status: AC
  Administered 2017-12-10: 500 mL via INTRAVENOUS

## 2017-12-10 MED ORDER — METHOHEXITAL SODIUM 0.5 G IJ SOLR
INTRAMUSCULAR | Status: AC
Start: 2017-12-10 — End: ?
  Filled 2017-12-10: qty 500

## 2017-12-10 MED ORDER — FENTANYL CITRATE (PF) 100 MCG/2ML IJ SOLN: 25.0000 ug | INTRAMUSCULAR | Status: DC | PRN

## 2017-12-10 MED ORDER — KETOROLAC TROMETHAMINE 30 MG/ML IJ SOLN
30.0000 mg | Freq: Once | INTRAMUSCULAR | Status: AC
  Administered 2017-12-10: 30 mg via INTRAVENOUS

## 2017-12-10 MED ORDER — METHOHEXITAL SODIUM 100 MG/10ML IV SOSY
PREFILLED_SYRINGE | INTRAVENOUS | Status: DC | PRN
  Administered 2017-12-10: 60 mg via INTRAVENOUS

## 2017-12-10 MED ORDER — KETOROLAC TROMETHAMINE 30 MG/ML IJ SOLN
INTRAMUSCULAR | Status: AC
  Administered 2017-12-10: 30 mg via INTRAVENOUS
  Filled 2017-12-10: qty 1

## 2017-12-10 NOTE — Progress Notes (Signed)
Patient remain NPO though out the night For scheduled ECT procedure in AM  Noted.

## 2017-12-10 NOTE — Anesthesia Post-op Follow-up Note (Signed)
Anesthesia QCDR form completed.        

## 2017-12-10 NOTE — Procedures (Signed)
ECT SERVICES Physician's Interval Evaluation & Treatment Note  Patient Identification: Padee Wadel MRN:  [id number] Date of Evaluation:  12/10/2017 TX #: 5  MADRS:   MMSE:   P.E. Findings:  No change to physical exam.  Seems to be gaining a little weight.  Psychiatric Interval Note:  Mood is slightly improved.  A little more energetic and interactive.  Subjective:  Patient is a 52 y.o. female seen for evaluation for Electroconvulsive Therapy. No specific new complaint but does have new memory problems  Treatment Summary:   []   Right Unilateral             [x]  Bilateral   % Energy : 1.0 ms 25%   Impedance: 1480 ohms  Seizure Energy Index: 16,758 V squared  Postictal Suppression Index: 89%  Seizure Concordance Index: 93%  Medications  Pre Shock: Zofran 4 mg Brevital 60 mg succinylcholine 80 mg  Post Shock:    Seizure Duration: 49 seconds by EMG 58 seconds by EEG   Comments: Follow-up on Wednesday  Lungs:  [x]   Clear to auscultation               []  Other:   Heart:    [x]   Regular rhythm             []  irregular rhythm    [x]   Previous H&P reviewed, patient examined and there are NO CHANGES                 []   Previous H&P reviewed, patient examined and there are changes noted.   Mordecai Rasmussen, MD 1/28/201910:36 AM

## 2017-12-10 NOTE — Anesthesia Postprocedure Evaluation (Signed)
Anesthesia Post Note  Patient: Julia Wheeler  Procedure(s) Performed: ECT TX  Patient location during evaluation: PACU Anesthesia Type: General Level of consciousness: awake and alert and oriented Pain management: pain level controlled Vital Signs Assessment: post-procedure vital signs reviewed and stable Respiratory status: spontaneous breathing Cardiovascular status: blood pressure returned to baseline Anesthetic complications: no     Last Vitals:  Vitals:   12/10/17 1122 12/10/17 1515  BP: 105/83 99/74  Pulse: 80 97  Resp: 17 20  Temp: 37.2 C 36.8 C  SpO2: 100% 100%    Last Pain:  Vitals:   12/10/17 1515  TempSrc: Oral  PainSc:                  Julia Wheeler

## 2017-12-10 NOTE — Tx Team (Signed)
Interdisciplinary Treatment and Diagnostic Plan Update  12/10/2017 Time of Session: 1;30PM Julia Wheeler MRN: [id number]  Principal Diagnosis: Severe recurrent major depression with psychotic features [facility])  Secondary Diagnoses: Principal Problem:   Severe recurrent major depression with psychotic features (HCC) Active Problems:   Sickle cell trait (HCC)   Nausea with vomiting   Essential hypertension   PTSD (post-traumatic stress disorder)   Alcohol use disorder, moderate, dependence (HCC)   Current Medications:  Current Facility-Administered Medications  Medication Dose Route Frequency Provider Last Rate Last Dose  . acetaminophen (TYLENOL) tablet 650 mg  650 mg Oral Q6H PRN Clapacs, Jackquline Denmark, MD   650 mg at 12/03/17 0006  . alum & mag hydroxide-simeth (MAALOX/MYLANTA) 200-200-20 MG/5ML suspension 30 mL  30 mL Oral Q4H PRN Clapacs, [name], MD   30 mL at 12/06/17 1336  . ARIPiprazole (ABILIFY) tablet 10 mg  10 mg Oral Daily Clapacs, Jackquline Denmark, MD   10 mg at 12/10/17 1225  . atorvastatin (LIPITOR) tablet 10 mg  10 mg Oral QHS Clapacs, [name], MD   10 mg at 12/09/17 2126  . busPIRone (BUSPAR) tablet 10 mg  10 mg Oral TID PRN Aundria Rud, MD   10 mg at 12/09/17 1653  . carvedilol (COREG) tablet 6.25 mg  6.25 mg Oral BID WC Clapacs, [name], MD   6.25 mg at 12/10/17 0805  . cyproheptadine (PERIACTIN) 4 MG tablet 2 mg  2 mg Oral TID Beverly Sessions, MD   2 mg at 12/10/17 1225  . docusate sodium (COLACE) capsule 100 mg  100 mg Oral BID Clapacs, Jackquline Denmark, MD   100 mg at 12/09/17 1653  . feeding supplement (BOOST / RESOURCE BREEZE) liquid 2 Container  2 Container Oral TID WC Clapacs, Jackquline Denmark, MD   2 Container at 12/09/17 1804  . ferrous sulfate tablet 325 mg  325 mg Oral Daily Clapacs, [name], MD   325 mg at 12/10/17 1226  . haloperidol (HALDOL) tablet 5 mg  5 mg Oral Q6H PRN Clapacs, [name], MD      . loratadine (CLARITIN) tablet 10 mg  10 mg Oral Daily Clapacs, Jackquline Denmark, MD   10 mg at  12/10/17 1225  . magnesium hydroxide (MILK OF MAGNESIA) suspension 30 mL  30 mL Oral Daily PRN Clapacs, Jackquline Denmark, MD   30 mL at 12/01/17 0852  . mirtazapine (REMERON) tablet 15 mg  15 mg Oral QHS Beverly Sessions, MD   15 mg at 12/09/17 2126  . multivitamin with minerals tablet 1 tablet  1 tablet Oral Daily Clapacs, Jackquline Denmark, MD   1 tablet at 12/10/17 1225  . ondansetron (ZOFRAN) injection 4 mg  4 mg Intravenous Once Clapacs, [name], MD      . pantoprazole (PROTONIX) EC tablet 40 mg  40 mg Oral BID Henrene Dodge, MD   40 mg at 12/10/17 0805  . polyethylene glycol (MIRALAX / GLYCOLAX) packet 17 g  17 g Oral Daily Piscoya, Jose, MD   17 g at 12/04/17 0849  . promethazine (PHENERGAN) tablet 12.5 mg  12.5 mg Oral Q6H PRN Clapacs, Jackquline Denmark, MD   12.5 mg at 11/29/17 1509  . sertraline (ZOLOFT) tablet 200 mg  200 mg Oral Daily Clapacs, [name], MD   200 mg at 12/10/17 1226  . traZODone (DESYREL) tablet 150 mg  150 mg Oral QHS Clapacs, Jackquline Denmark, MD   150 mg at 12/09/17 2126  . triamterene-hydrochlorothiazide (MAXZIDE-25) 37.5-25 MG  per tablet 2 tablet  2 tablet Oral Daily Clapacs, Jackquline Denmark, MD   2 tablet at 12/09/17 0827   PTA Medications: Medications Prior to Admission  Medication Sig Dispense Refill Last Dose  . acetaminophen (TYLENOL) 325 MG tablet Take 2 tablets (650 mg total) by mouth every 6 (six) hours as needed for mild pain.   12/09/2017 at Unknown time  . alum & mag hydroxide-simeth (MAALOX/MYLANTA) 200-200-20 MG/5ML suspension Take 30 mLs by mouth every 4 (four) hours as needed for indigestion. 355 mL 0 12/09/2017 at Unknown time  . ARIPiprazole (ABILIFY) 10 MG tablet Take 1 tablet (10 mg total) by mouth daily. For mood control   12/09/2017 at Unknown time  . atorvastatin (LIPITOR) 10 MG tablet Take 1 tablet (10 mg total) by mouth at bedtime. For high cholesterol   12/09/2017 at Unknown time  . carvedilol (COREG) 6.25 MG tablet Take 1 tablet (6.25 mg total) by mouth 2 (two) times daily with a meal. For high  blood pressure   12/09/2017 at Unknown time  . cetirizine (ZYRTEC) 10 MG tablet Take 1 tablet (10 mg total) by mouth as needed for allergies.  2 12/09/2017 at Unknown time  . ferrous sulfate 325 (65 FE) MG tablet Take 1 tablet (325 mg total) by mouth daily. For anemia  1 12/09/2017 at Unknown time  . haloperidol (HALDOL) 5 MG tablet Take 1 tablet (5 mg total) by mouth every 6 (six) hours as needed for agitation (psychosis).   12/09/2017 at Unknown time  . hydrOXYzine (ATARAX/VISTARIL) 50 MG tablet Take 1 tablet (50 mg total) by mouth every 6 (six) hours as needed for anxiety. 30 tablet 0 12/09/2017 at Unknown time  . lamoTRIgine (LAMICTAL) 25 MG tablet Take 2 tablets (50 mg total) by mouth daily. For mood stabilization   12/09/2017 at Unknown time  . LORazepam (ATIVAN) 1 MG tablet Take 1 tablet (1 mg total) by mouth every 6 (six) hours as needed for anxiety or sleep. 1 tablet 0 12/09/2017 at Unknown time  . magnesium hydroxide (MILK OF MAGNESIA) 400 MG/5ML suspension Take 30 mLs by mouth daily as needed for mild constipation. 360 mL 0 12/09/2017 at Unknown time  . sertraline (ZOLOFT) 50 MG tablet Take 3 tablets (150 mg total) by mouth daily. For depression   12/09/2017 at Unknown time  . traZODone (DESYREL) 150 MG tablet Take 1 tablet (150 mg total) by mouth at bedtime. For sleep   12/09/2017 at Unknown time  . triamterene-hydrochlorothiazide (MAXZIDE-25) 37.5-25 MG tablet Take 2 tablets by mouth daily. For high blood pressure   12/09/2017 at Unknown time    Patient Stressors: Financial difficulties Medication change or noncompliance  Patient Strengths: Ability for insight Motivation for treatment/growth Supportive family/friends  Treatment Modalities: Medication Management, Group therapy, Case management,  1 to 1 session with clinician, Psychoeducation, Recreational therapy.   Physician Treatment Plan for Primary Diagnosis: Severe recurrent major depression with psychotic features (HCC) Long Term  Goal(s): Improvement in symptoms so as ready for discharge Improvement in symptoms so as ready for discharge   Short Term Goals: Ability to verbalize feelings will improve Ability to disclose and discuss suicidal ideas Ability to maintain clinical measurements within normal limits will improve Compliance with prescribed medications will improve  Medication Management: Evaluate patient's response, side effects, and tolerance of medication regimen.  Therapeutic Interventions: 1 to 1 sessions, Unit Group sessions and Medication administration.  Evaluation of Outcomes: Progressing  Physician Treatment Plan for Secondary Diagnosis: Principal Problem:  Severe recurrent major depression with psychotic features (HCC) Active Problems:   Sickle cell trait (HCC)   Nausea with vomiting   Essential hypertension   PTSD (post-traumatic stress disorder)   Alcohol use disorder, moderate, dependence (HCC)  Long Term Goal(s): Improvement in symptoms so as ready for discharge Improvement in symptoms so as ready for discharge   Short Term Goals: Ability to verbalize feelings will improve Ability to disclose and discuss suicidal ideas Ability to maintain clinical measurements within normal limits will improve Compliance with prescribed medications will improve     Medication Management: Evaluate patient's response, side effects, and tolerance of medication regimen.  Therapeutic Interventions: 1 to 1 sessions, Unit Group sessions and Medication administration.  Evaluation of Outcomes: Progressing   RN Treatment Plan for Primary Diagnosis: Severe recurrent major depression with psychotic features (HCC) Long Term Goal(s): Knowledge of disease and therapeutic regimen to maintain health will improve  Short Term Goals: Ability to remain free from injury will improve, Ability to demonstrate self-control, Ability to verbalize feelings will improve, Ability to disclose and discuss suicidal ideas and  Compliance with prescribed medications will improve  Medication Management: RN will administer medications as ordered by provider, will assess and evaluate patient's response and provide education to patient for prescribed medication. RN will report any adverse and/or side effects to prescribing provider.  Therapeutic Interventions: 1 on 1 counseling sessions, Psychoeducation, Medication administration, Evaluate responses to treatment, Monitor vital signs and CBGs as ordered, Perform/monitor CIWA, COWS, AIMS and Fall Risk screenings as ordered, Perform wound care treatments as ordered.  Evaluation of Outcomes: Progressing   LCSW Treatment Plan for Primary Diagnosis: Severe recurrent major depression with psychotic features (HCC) Long Term Goal(s): Safe transition to appropriate next level of care at discharge, Engage patient in therapeutic group addressing interpersonal concerns.  Short Term Goals: Engage patient in aftercare planning with referrals and resources, Increase social support, Increase ability to appropriately verbalize feelings, Increase emotional regulation, Identify triggers associated with mental health/substance abuse issues and Increase skills for wellness and recovery  Therapeutic Interventions: Assess for all discharge needs, 1 to 1 time with Social worker, Explore available resources and support systems, Assess for adequacy in community support network, Educate family and significant other(s) on suicide prevention, Complete Psychosocial Assessment, Interpersonal group therapy.  Evaluation of Outcomes: Progressing   Progress in Treatment: Attending groups: Yes. Participating in groups: Yes. Taking medication as prescribed: Yes. Toleration medication: Yes. Family/Significant other contact made: Yes, individual(s) contacted:  [employer], [phone] Patient understands diagnosis: Yes. Discussing patient identified problems/goals with staff: Yes. Medical problems  stabilized or resolved: Yes. Denies suicidal/homicidal ideation: Yes. Issues/concerns per patient self-inventory: Yes. Other:   New problem(s) identified: No, Describe:  None  New Short Term/Long Term Goal(s): "I want to understand why I tried to kill myself." Discharge Plan or Barriers: To return home to live with her son and attend outpatient treatment [facility]  Reason for Continuation of Hospitalization: Depression Medication stabilization  Estimated Length of Stay: 5-7 days  Recreational Therapy: Patient Stressors: Depression Patient Goal: Patient will identify 3 triggers for depressive symptoms x5 days.    Attendees: Patient:  12/10/2017 1:53 PM  Physician: Mordecai Rasmussen, MD 12/10/2017 1:53 PM  Nursing: Nile Riggs, RN 12/10/2017 1:53 PM  RN Care Manager: 12/10/2017 1:53 PM  Social Worker: Heidi Dach, LCSW 12/10/2017 1:53 PM  Recreational Therapist: Danella Deis. [facility]law [facility]S, LRT 12/10/2017 1:53 PM  Other: 12/10/2017 1:53 PM  Other:  12/10/2017 1:53 PM  Other: 12/10/2017 1:53  PM    Scribe for Treatment Team: Heidi Dach, [place] 12/10/2017 1:53 PM

## 2017-12-10 NOTE — Plan of Care (Signed)
  Progressing Coping: Ability to cope will improve 12/10/2017 1939 - Progressing by Lelan Pons, RN Self-Concept: Ability to disclose and discuss suicidal ideas will improve 12/10/2017 1939 - Progressing by Lelan Pons, RN [facility] Participation in Recreation Therapeutic Interventions STG-Other Recreation Therapy Goal (Specify) Description Patient will identify 3 triggers for depressive symptoms x5 days.   12/10/2017 1939 - Progressing by Lelan Pons, RN Activity: Sleeping patterns will improve 12/10/2017 1939 - Progressing by Lelan Pons, RN
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
## 2017-12-10 NOTE — Plan of Care (Signed)
Patient stated that when she came here her depression was 10/10 now it is 6/10 and the ECT is helping her.

## 2017-12-10 NOTE — Transfer of Care (Signed)
Immediate Anesthesia Transfer of Care Note  Patient: Julia Wheeler  Procedure(s) Performed: ECT TX  Patient Location: PACU  Anesthesia Type:General  Level of Consciousness: drowsy and patient cooperative  Airway & Oxygen Therapy: Patient Spontanous Breathing and Patient connected to face mask oxygen  Post-op Assessment: Report given to RN and Post -op Vital signs reviewed and stable  Post vital signs: Reviewed and stable  Last Vitals:  Vitals:   12/10/17 0601 12/10/17 0903  BP: 91/72 110/84  Pulse: (!) 102 79  Resp:  14  Temp:  36.7 C  SpO2:  100%    Last Pain:  Vitals:   12/10/17 0600  TempSrc: Oral  PainSc:       Patients Stated Pain Goal: 3 (12/03/17 0006)  Complications: No apparent anesthesia complications

## 2017-12-10 NOTE — Progress Notes (Signed)
Patient is stable and alert responding well to treatment, minimal interaction with peers. Patient is safe in the unit and contract for safety of self and others, sleeping long hours without interruptions , no signs of adverse reaction from ECT treatment, 15 minute safety round is in progress no distress noted.

## 2017-12-10 NOTE — [facility] Group Notes (Signed)
12/10/2017  Time: 0930   Type of Therapy and Topic:  Group Therapy:  Overcoming Obstacles   Participation Level:  Did Not Attend   Description of Group:   In this group patients will be encouraged to explore what they see as obstacles to their own wellness and recovery. They will be guided to discuss their thoughts, feelings, and behaviors related to these obstacles. The group will process together ways to cope with barriers, with attention given to specific choices patients can make. Each patient will be challenged to identify changes they are motivated to make in order to overcome their obstacles. This group will be process-oriented, with patients participating in exploration of their own experiences, giving and receiving support, and processing challenge from other group members.   Therapeutic Goals: 1. Patient will identify personal and current obstacles as they relate to admission. 2. Patient will identify barriers that currently interfere with their wellness or overcoming obstacles.  3. Patient will identify feelings, thought process and behaviors related to these barriers. 4. Patient will identify two changes they are willing to make to overcome these obstacles:      Summary of Patient Progress  Pt was invited to attend group but was receiving ECT off the unit during group time. CSW will continue to encourage pt to attend group throughout their admission.     Therapeutic Modalities:   Cognitive Behavioral Therapy Solution Focused Therapy Motivational Interviewing Relapse Prevention Therapy  Heidi Dach, MSW, LCSW 12/10/2017 10:29 AM

## 2017-12-10 NOTE — Progress Notes (Signed)
Recreation Therapy Notes  Date: 01.28.2019  Time: 1:00 PM  Location: Craft Room  Behavioral response: Appropriate  Intervention Topic: Coping Skills  Discussion/Intervention: Group content on today was focused on coping skills. The group defined what coping skills are and when they can be used. Individuals described how they normally cope with thing and the coping skills they normally use. Patients expressed why it is important to cope with things and how not coping with things can affect you. The group participated in the intervention "My coping box" and made coping boxes while adding coping skills they could use in the future to the box. Clinical Observations/Feedback:  Patient came to group and focused on what his peers and staff had to say about the topic at hand. She participated in the intervention and continues to make progress towards her goals.  Brok Stocking LRT/CTRS         Syeda Prickett 12/10/2017 2:19 PM
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
## 2017-12-11 NOTE — [facility] Group Notes (Signed)
12/11/2017 9:30AM  Type of Therapy/Topic:  Group Therapy:  Feelings about Diagnosis  Participation Level:  Minimal   Description of Group:   This group will allow patients to explore their thoughts and feelings about diagnoses they have received. Patients will be guided to explore their level of understanding and acceptance of these diagnoses. Facilitator will encourage patients to process their thoughts and feelings about the reactions of others to their diagnosis and will guide patients in identifying ways to discuss their diagnosis with significant others in their lives. This group will be process-oriented, with patients participating in exploration of their own experiences, giving and receiving support, and processing challenge from other group members.   Therapeutic Goals: 1. Patient will demonstrate understanding of diagnosis as evidenced by identifying two or more symptoms of the disorder 2. Patient will be able to express two feelings regarding the diagnosis 3. Patient will demonstrate their ability to communicate their needs through discussion and/or role play  Summary of Patient Progress: Actively and appropriately engaged in the group. Patient was able to provide support and validation to other group members.Patient practiced active listening when interacting with the facilitator and other group members Patient in still in the process of obtaining treatment goals. Julia Wheeler says that she is feeling lethargic and anxious. "I don't know what the plan is and how long I'm going to be here."     Therapeutic Modalities:   Cognitive Behavioral Therapy Brief Therapy Feelings Identification    Julia Shears, LCSW 12/11/2017 10:15 AM

## 2017-12-11 NOTE — Plan of Care (Signed)
  Progressing Education: Knowledge of [facility] Education information/materials will improve 12/11/2017 1345 - Progressing by Crisoforo Oxford, RN Coping: Ability to cope will improve 12/11/2017 1345 - Progressing by Crisoforo Oxford, RN Medication: Compliance with prescribed medication regimen will improve 12/11/2017 1345 - Progressing by Crisoforo Oxford, RN Self-Concept: Ability to disclose and discuss suicidal ideas will improve 12/11/2017 1345 - Progressing by Crisoforo Oxford, RN Ability to verbalize positive feelings about self will improve 12/11/2017 1345 - Progressing by Crisoforo Oxford, RN Safety: Ability to remain free from injury will improve 12/11/2017 1345 - Progressing by Crisoforo Oxford, RN [facility] Participation in Recreation Therapeutic Interventions STG-Other Recreation Therapy Goal (Specify) Description Patient will identify 3 triggers for depressive symptoms x5 days.   12/11/2017 1345 - Progressing by Crisoforo Oxford, RN Spiritual Needs Ability to function at adequate level 12/11/2017 1345 - Progressing by Crisoforo Oxford, RN Activity: Sleeping patterns will improve 12/11/2017 1345 - Progressing by Crisoforo Oxford, RN

## 2017-12-11 NOTE — Progress Notes (Signed)
Patient is alert, she was confused about ECt, states I can't eat breakfast, because I'm  Having ECT today, nurse informed her that it was not scheduled on Tuesdays. Patient voiced understanding, she is pleasant and cooperative, she got up to take medications, and eat breakfast. q 15 minute checks and camera surveillance also for safety.

## 2017-12-11 NOTE — [facility] Group Notes (Signed)
LCSW Group Therapy Note 12/11/2017 9:00am  Type of Therapy and Topic:  Group Therapy:  Setting Goals  Participation Level:  Minimal  Description of Group: In this process group, patients discussed using strengths to work toward goals and address challenges.  Patients identified two positive things about themselves and one goal they were working on.  Patients were given the opportunity to share openly and support each other's plan for self-empowerment.  The group discussed the value of gratitude and were encouraged to have a daily reflection of positive characteristics or circumstances.  Patients were encouraged to identify a plan to utilize their strengths to work on current challenges and goals.  Therapeutic Goals 1. Patient will verbalize personal strengths/positive qualities and relate how these can assist with achieving desired personal goals 2. Patients will verbalize affirmation of peers plans for personal change and goal setting 3. Patients will explore the value of gratitude and positive focus as related to successful achievement of goals 4. Patients will verbalize a plan for regular reinforcement of personal positive qualities and circumstances.  Summary of Patient Progress:  Pt shares that her goal is to talk with her doctor and CSW about her depression.     Therapeutic Modalities Cognitive Behavioral Therapy Motivational Interviewing    Glennon Mac, LCSW 12/11/2017 1:26 PM

## 2017-12-11 NOTE — Progress Notes (Signed)
   12/11/17 1530  Clinical Encounter Type  Visited With Patient  Visit Type Other (Comment) (group)  Spiritual Encounters  Spiritual Needs Emotional   Patient participated in chaplain's life review group.

## 2017-12-11 NOTE — [facility] Group Notes (Signed)
[facility] Group Notes:  (Nursing/MHT/Case Management/Adjunct)  Date:  12/11/2017  Time:  2:44 AM  Type of Therapy:  Psychoeducational Skills  Participation Level:  Active  Participation Quality:  Appropriate  Affect:  Appropriate  Cognitive:  Appropriate  Insight:  Appropriate  Engagement in Group:  Engaged  Modes of Intervention:  Discussion, Socialization and Support  Summary of Progress/Problems:  Julia Wheeler 12/11/2017, 2:44 AM

## 2017-12-11 NOTE — [facility] Group Notes (Signed)
[facility] Group Notes:  (Nursing/MHT/Case Management/Adjunct)  Date:  12/11/2017  Time:  9:57 PM  Type of Therapy:  Group Therapy  Participation Level:  Active  Participation Quality:  Appropriate  Affect:  Appropriate  Cognitive:  Alert  Insight:  Good  Engagement in Group:  Engaged  Summary of Progress/Problems:  Julia Wheeler 12/11/2017, 9:57 PM

## 2017-12-11 NOTE — Progress Notes (Addendum)
Recreation Therapy Notes  Date: 01.29.2019  Time: 1:00 PM  Location: Craft Room  Behavioral response: Appropriate  Intervention Topic: Values  Discussion/Intervention: Group content today was focused on values. The group identified what values are and where they come from. Individuals expressed some values and how many they have. Patients described how they go about add or removing values. The group described the importance of having values and how they go about using them in daily life. Patient participated in the intervention "My Values" where they were able to pick out values that were important to them and make a visual aide.  Clinical Observations/Feedback:  Patient came to group and defined values as core things you govern your life by.  She stated that values are important because society needs rules and regulation. Individual participated in the intervention and continues to make progress towards her goals.  Gwenith Tschida LRT/CTRS            Pauleen Goleman 12/11/2017 2:21 PM
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
## 2017-12-11 NOTE — Progress Notes (Signed)
No noticeable side effects from ECT procedure , patient affect is getting brighter and is tolerating treatment well patient is happy with her progress towards healing, contract for safety of self and others, patient is sleeping long hours at a time without any disturbances denies any thoughts of suicidal ideas and no signs of AVH, no acute distress.

## 2017-12-11 NOTE — Plan of Care (Signed)
  Progressing Coping: Ability to cope will improve 12/11/2017 1934 - Progressing by Lelan Pons, RN Self-Concept: Ability to disclose and discuss suicidal ideas will improve 12/11/2017 1934 - Progressing by Lelan Pons, RN Ability to verbalize positive feelings about self will improve 12/11/2017 1934 - Progressing by Lelan Pons, RN Safety: Ability to remain free from injury will improve 12/11/2017 1934 - Progressing by Lelan Pons, RN [facility] Participation in Recreation Therapeutic Interventions ST[facility]-Other Recreation Therapy Goal (Specify) Description Patient will identify 3 triggers for depressive symptoms x5 days.   12/11/2017 1934 - Progressing by Lelan Pons, RN Activity: Sleeping patterns will improve 12/11/2017 1934 - Progressing by Lelan Pons, RN

## 2017-12-12 ENCOUNTER — Inpatient Hospital Stay: Payer: No Typology Code available for payment source | Admitting: Anesthesiology

## 2017-12-12 ENCOUNTER — Other Ambulatory Visit: Payer: Self-pay | Admitting: Psychiatry

## 2017-12-12 LAB — GLUCOSE, CAPILLARY: Glucose-Capillary: 84 mg/dL (ref 65–99)

## 2017-12-12 MED ORDER — SUCCINYLCHOLINE CHLORIDE 20 MG/ML IJ SOLN
INTRAMUSCULAR | Status: AC
  Filled 2017-12-12: qty 1

## 2017-12-12 MED ORDER — METHOHEXITAL SODIUM 100 MG/10ML IV SOSY
PREFILLED_SYRINGE | INTRAVENOUS | Status: DC | PRN
  Administered 2017-12-12: 60 mg via INTRAVENOUS

## 2017-12-12 MED ORDER — SODIUM CHLORIDE 0.9 % IV SOLN
INTRAVENOUS | Status: DC | PRN
  Administered 2017-12-12: 09:00:00 via INTRAVENOUS

## 2017-12-12 MED ORDER — SODIUM CHLORIDE 0.9 % IV SOLN
500.0000 mL | Freq: Once | INTRAVENOUS | Status: AC
  Administered 2017-12-12: 500 mL via INTRAVENOUS

## 2017-12-12 MED ORDER — SUCCINYLCHOLINE CHLORIDE 200 MG/10ML IV SOSY
PREFILLED_SYRINGE | INTRAVENOUS | Status: DC | PRN
  Administered 2017-12-12: 80 mg via INTRAVENOUS

## 2017-12-12 MED ORDER — ONDANSETRON HCL 4 MG/2ML IJ SOLN
4.0000 mg | Freq: Once | INTRAMUSCULAR | Status: AC
  Administered 2017-12-12: 4 mg via INTRAVENOUS

## 2017-12-12 MED ORDER — ONDANSETRON HCL 4 MG/2ML IJ SOLN
INTRAMUSCULAR | Status: AC
  Filled 2017-12-12: qty 2

## 2017-12-12 NOTE — Anesthesia Post-op Follow-up Note (Signed)
Anesthesia QCDR form completed.        

## 2017-12-12 NOTE — Progress Notes (Signed)
Recreation Therapy Notes  Date: 01.30 .2019  Time: 1:00 PM  Location: Craft Room  Behavioral response: Appropriate  Intervention Topic: Self-care  Discussion/Intervention: Group content today was focused on Self-Care. The group defined self-care and some positive ways they care for themselves. Individuals expressed ways and reasons why they neglected any self-care in the past. Patients described ways to improve self-care in the future. The group explained what could happen if they did not do any self-care activities at all. The group participated in the intervention "self-care assessment" where they had a chance to discover some of their weaknesses and strengths in self- care. Patient came up with a self-care plan to improve themselves in the future.  Clinical Observations/Feedback:  Patient came to group and stated some things that could improve her self-care is going on a vacation and calling friends and family. She stated the people she trusts is her children. Individual participated in the intervention and continues to make progress towards her goals.  Nikolaos Maddocks LRT/CTRS         Cloey Sferrazza 12/12/2017 1:56 PM

## 2017-12-12 NOTE — Progress Notes (Signed)
Patient's V/S stable post ECT. Alert and oriented x 4. Denied any SI/HI. q 15 hr and surveillance checks continuous. Will continue to monitor,

## 2017-12-12 NOTE — Transfer of Care (Signed)
Immediate Anesthesia Transfer of Care Note  Patient: Julia Wheeler  Procedure(s) Performed: ECT TX  Patient Location: PACU  Anesthesia Type:General  Level of Consciousness: sedated  Airway & Oxygen Therapy: Patient Spontanous Breathing and Patient connected to face mask oxygen  Post-op Assessment: Report given to RN and Post -op Vital signs reviewed and stable  Post vital signs: Reviewed and stable  Last Vitals:  Vitals:   12/12/17 0851 12/12/17 1132  BP: 109/76 117/84  Pulse: 79 87  Resp: 16 20  Temp: 36.7 C 36.6 C  SpO2: 100% 100%    Last Pain:  Vitals:   12/12/17 1132  TempSrc: Temporal  PainSc: Asleep      Patients Stated Pain Goal: 3 (12/03/17 0006)  Complications: No apparent anesthesia complications
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
## 2017-12-12 NOTE — Plan of Care (Signed)
  Progressing Coping: Ability to cope will improve 12/12/2017 2237 - Progressing by Galen Manila, RN Activity: Sleeping patterns will improve 12/12/2017 2237 - Progressing by Galen Manila, RN Activity: Interest or engagement in leisure activities will improve 12/12/2017 2237 - Progressing by Galen Manila, RN Coping: Ability to cope will improve 12/12/2017 2237 - Progressing by Galen Manila, RN

## 2017-12-12 NOTE — Plan of Care (Signed)
The patient is still NPO  and she's ready to be picked up for ECT procedure. Patient received Protonix 40 mg oral. Held her coreg 6.25 mg due to BP=93/69. The rest of her medications are held . No acute distress noted. 15 minutes checks and camera surveillance in progress. Will continue to monitor. Progressing Education: Knowledge of [facility] Education information/materials will improve 12/12/2017 1048 - Progressing by Crisoforo Oxford, RN Coping: Ability to cope will improve 12/12/2017 1048 - Progressing by Crisoforo Oxford, RN Medication: Compliance with prescribed medication regimen will improve 12/12/2017 1048 - Progressing by Crisoforo Oxford, RN Self-Concept: Ability to disclose and discuss suicidal ideas will improve 12/12/2017 1048 - Progressing by Crisoforo Oxford, RN Ability to verbalize positive feelings about self will improve 12/12/2017 1048 - Progressing by Crisoforo Oxford, RN Safety: Ability to remain free from injury will improve 12/12/2017 1048 - Progressing by Crisoforo Oxford, RN [facility] Participation in Recreation Therapeutic Interventions STG-Other Recreation Therapy Goal (Specify) Description Patient will identify 3 triggers for depressive symptoms x5 days.   12/12/2017 1048 - Progressing by Crisoforo Oxford, RN Spiritual Needs Ability to function at adequate level 12/12/2017 1048 - Progressing by Crisoforo Oxford, RN Activity: Sleeping patterns will improve 12/12/2017 1048 - Progressing by Crisoforo Oxford, RN  Scheduled medications held

## 2017-12-12 NOTE — Progress Notes (Addendum)
No issues noted

## 2017-12-12 NOTE — Progress Notes (Addendum)
Patient found in day room upon my arrival. Patient is visible but not social throughout the evening. Reports improvement in mood. Reports improvement in appetite. Denies SI, HI, AVH. Denies pain. Compliant with HS medications and staff direction. Q 15 minute checks maintained. Will continue to monitor throughout the shift.  Patient slept through the night. No distress noted. Compliant with am vitals. Will endorse care to oncoming shift.

## 2017-12-12 NOTE — [facility] Group Notes (Signed)
12/12/2017 9:30AM  Type of Therapy/Topic:  Group Therapy:  Emotion Regulation  Participation Level:  Did Not Attend   Description of Group:   The purpose of this group is to assist patients in learning to regulate negative emotions and experience positive emotions. Patients will be guided to discuss ways in which they have been vulnerable to their negative emotions. These vulnerabilities will be juxtaposed with experiences of positive emotions or situations, and patients will be challenged to use positive emotions to combat negative ones. Special emphasis will be placed on coping with negative emotions in conflict situations, and patients will process healthy conflict resolution skills.  Therapeutic Goals: 1. Patient will identify two positive emotions or experiences to reflect on in order to balance out negative emotions 2. Patient will label two or more emotions that they find the most difficult to experience 3. Patient will demonstrate positive conflict resolution skills through discussion and/or role plays  Summary of Patient Progress: Patient was encouraged and invited to attend group. Patient did not attend group. Social worker will continue to encourage group participation in the future.       Therapeutic Modalities:   Cognitive Behavioral Therapy Feelings Identification Dialectical Behavioral Therapy   [name], LCSW 12/12/2017 10:19 AM
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
## 2017-12-12 NOTE — Progress Notes (Signed)
Patient returned from ECT, alert and oriented x 4. Denied any acute pain. No respiratory distress noted.  HCTZ held due to BP=90/70. Received all her morning medications without any difficulties. Will continue  q15 minutes checks and camera surveillance. Will recheck V/S again  in 2 hours.

## 2017-12-12 NOTE — Progress Notes (Signed)
Patient remain NPO status for ECT procedures , no distress, sleeping long hours without any disturbances, patient is safe in the unit , 15 minute rounds is in progress.
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
## 2017-12-12 NOTE — Anesthesia Postprocedure Evaluation (Signed)
Anesthesia Post Note  Patient: Julia Wheeler  Procedure(s) Performed: ECT TX  Patient location during evaluation: PACU Anesthesia Type: General Level of consciousness: awake and alert Pain management: pain level controlled Vital Signs Assessment: post-procedure vital signs reviewed and stable Respiratory status: spontaneous breathing, nonlabored ventilation, respiratory function stable and patient connected to nasal cannula oxygen Cardiovascular status: blood pressure returned to baseline and stable Postop Assessment: no apparent nausea or vomiting Anesthetic complications: no     Last Vitals:  Vitals:   12/12/17 1200 12/12/17 1300  BP: 103/78 90/70  Pulse: 92 98  Resp: 18 20  Temp:  36.6 C  SpO2: 100% 100%    Last Pain:  Vitals:   12/12/17 1300  TempSrc: Oral  PainSc:                  [name]

## 2017-12-12 NOTE — Progress Notes (Signed)
Recreation Therapy Notes   Date: 01.30.2018  Time: 3:00pm  Location: Craft room  Behavioral response: Appropriate  Group Type: Craft  Participation level: Active  Communication: Patient was social with peers and staff during group.  Comments: N/A  Joeph Szatkowski LRT/CTRS        Shamaine Mulkern 12/12/2017 4:01 PM

## 2017-12-12 NOTE — Procedures (Signed)
ECT SERVICES Physician's Interval Evaluation & Treatment Note  Patient Identification: Rubiana Angelico MRN:  [id number] Date of Evaluation:  12/12/2017 TX #: [id number]  MADRS:   MMSE:   P.E. Findings:  No change to physical exam  Psychiatric Interval Note:  The patient's mood is no longer reported as depressed she is not having suicidal thoughts.  She does have some memory impairment  Subjective:  Patient is a 52 y.o. female seen for evaluation for Electroconvulsive Therapy. Some memory impairment no other specific complaint  Treatment Summary:   []   Right Unilateral             [x]  Bilateral   % Energy : 1.0 ms 25%   Impedance: 2030 ohms  Seizure Energy Index: 14,376 V squared  Postictal Suppression Index: 23%  Seizure Concordance Index: 97%  Medications  Pre Shock: Zofran 4 mg Brevital 60 mg succinylcholine 80 mg  Post Shock:    Seizure Duration: 41 seconds by EMG 45 seconds by EEG   Comments: Follow-up Friday  Lungs:  [x]   Clear to auscultation               []  Other:   Heart:    [x]   Regular rhythm             []  irregular rhythm    [x]   Previous H&P reviewed, patient examined and there are NO CHANGES                 []   Previous H&P reviewed, patient examined and there are changes noted.   Mordecai Rasmussen, MD 1/30/201911:17 AM

## 2017-12-12 NOTE — [facility] Group Notes (Signed)
[facility] Group Notes:  (Nursing/MHT/Case Management/Adjunct)  Date:  12/12/2017  Time:  2:32 PM  Type of Therapy:  Psychoeducational Skills  Participation Level:  Active  Participation Quality:  Appropriate, Attentive and Sharing  Affect:  Appropriate and Flat  Cognitive:  Alert and Appropriate  Insight:  Appropriate and Good  Engagement in Group:  Improving  Modes of Intervention:  Activity and Education  Summary of Progress/Problems:  Julia Wheeler 12/12/2017, 2:32 PM

## 2017-12-13 NOTE — Plan of Care (Signed)
  Education: Knowledge of [place] Education information/materials will improve 12/13/2017 1848 - Adequate for Discharge by Elige Radon, RN   Activity: Sleeping patterns will improve 12/13/2017 1848 - Adequate for Discharge by Elige Radon, RN   Coping: Ability to cope will improve 12/13/2017 1848 - Progressing by Elige Radon, RN Note Pleasant and cooperative. Attending groups.  More visible in the milieu.  Verbalizes coping skills .   Medication: Compliance with prescribed medication regimen will improve 12/13/2017 1848 - Progressing by Elige Radon, RN Note Medication compliant.     Self-Concept: Ability to disclose and discuss suicidal ideas will improve 12/13/2017 1848 - Progressing by Elige Radon, RN Note Denies SI Ability to verbalize positive feelings about self will improve 12/13/2017 1848 - Progressing by Elige Radon, RN   Safety: Ability to remain free from injury will improve 12/13/2017 1848 - Progressing by Elige Radon, RN Note Remains safe on the unit   [facility] Participation in Recreation Therapeutic Interventions STG-Other Recreation Therapy Goal (Specify) Description Patient will identify 3 triggers for depressive symptoms x5 days.   12/13/2017 1848 - Progressing by Elige Radon, RN   Spiritual Needs Ability to function at adequate level 12/13/2017 1848 - Progressing by Elige Radon, RN Note Attending groups.  More visible in the milieu.     Activity: Interest or engagement in leisure activities will improve 12/13/2017 1848 - Progressing by Elige Radon, RN Note Attending groups.  Visible in milieu although no initiation of communication Imbalance in normal sleep/wake cycle will improve 12/13/2017 1848 - Progressing by Elige Radon, RN   Education: Utilization of techniques to improve thought processes will improve 12/13/2017 1848 - Progressing by Elige Radon, RN Knowledge of the prescribed therapeutic  regimen will improve 12/13/2017 1848 - Progressing by Elige Radon, RN   Coping: Ability to cope will improve 12/13/2017 1848 - Progressing by Elige Radon, RN Ability to verbalize feelings will improve 12/13/2017 1848 - Progressing by Elige Radon, RN   Health Behavior/Discharge Planning: Ability to make decisions will improve 12/13/2017 1848 - Progressing by Elige Radon, RN Compliance with therapeutic regimen will improve 12/13/2017 1848 - Progressing by Elige Radon, RN   Role Relationship: Ability to demonstrate positive changes in social behaviors and relationships will improve 12/13/2017 1848 - Progressing by Elige Radon, RN   Safety: Ability to disclose and discuss suicidal ideas will improve 12/13/2017 1848 - Progressing by Elige Radon, RN Ability to identify and utilize support systems that promote safety will improve 12/13/2017 1848 - Progressing by Elige Radon, RN   Self-Concept: Ability to verbalize positive feelings about self will improve 12/13/2017 1848 - Progressing by Elige Radon, RN Level of anxiety will decrease 12/13/2017 1848 - Progressing by Elige Radon, RN

## 2017-12-13 NOTE — [facility] Group Notes (Signed)
LCSW Group Therapy Note 12/13/2017 9:00 AM  Type of Therapy and Topic:  Group Therapy:  Setting Goals  Participation Level:  Active  Description of Group: In this process group, patients discussed using strengths to work toward goals and address challenges.  Patients identified two positive things about themselves and one goal they were working on.  Patients were given the opportunity to share openly and support each other's plan for self-empowerment.  The group discussed the value of gratitude and were encouraged to have a daily reflection of positive characteristics or circumstances.  Patients were encouraged to identify a plan to utilize their strengths to work on current challenges and goals.  Therapeutic Goals 1. Patient will verbalize personal strengths/positive qualities and relate how these can assist with achieving desired personal goals 2. Patients will verbalize affirmation of peers plans for personal change and goal setting 3. Patients will explore the value of gratitude and positive focus as related to successful achievement of goals 4. Patients will verbalize a plan for regular reinforcement of personal positive qualities and circumstances.  Summary of Patient Progress: Julia Wheeler was able to participate in today's group by sharing that her goal for today is "try to learn more about my diagnosis and develop better coping strategies". Julia Wheeler did not actively join in on the discuss and share her experiences of how she has developed SMART goals in her life.  She did share that she had a good understanding of how she can develop her own SMART goals going forward.      Therapeutic Modalities Cognitive Behavioral Therapy Motivational Interviewing    Alease Frame, [place] 12/13/2017 12:55 PM

## 2017-12-13 NOTE — Plan of Care (Signed)
  Progressing Education: Knowledge of [facility] Education information/materials will improve 12/13/2017 2143 - Progressing by Galen Manila, RN Coping: Ability to cope will improve 12/13/2017 2143 - Progressing by Galen Manila, RN Medication: Compliance with prescribed medication regimen will improve 12/13/2017 2143 - Progressing by Galen Manila, RN Self-Concept: Ability to disclose and discuss suicidal ideas will improve 12/13/2017 2143 - Progressing by Galen Manila, RN Ability to verbalize positive feelings about self will improve 12/13/2017 2143 - Progressing by Galen Manila, RN Safety: Ability to remain free from injury will improve 12/13/2017 2143 - Progressing by Galen Manila, RN [facility] Participation in Recreation Therapeutic Interventions STG-Other Recreation Therapy Goal (Specify) Description Patient will identify 3 triggers for depressive symptoms x5 days.   12/13/2017 2143 - Progressing by Galen Manila, RN Spiritual Needs Ability to function at adequate level 12/13/2017 2143 - Progressing by Galen Manila, RN Activity: Sleeping patterns will improve 12/13/2017 2143 - Progressing by Galen Manila, RN Activity: Interest or engagement in leisure activities will improve 12/13/2017 2143 - Progressing by Galen Manila, RN Imbalance in normal sleep/wake cycle will improve 12/13/2017 2143 - Progressing by Galen Manila, RN Education: Utilization of techniques to improve thought processes will improve 12/13/2017 2143 - Progressing by Galen Manila, RN Knowledge of the prescribed therapeutic regimen will improve 12/13/2017 2143 - Progressing by Galen Manila, RN Coping: Ability to cope will improve 12/13/2017 2143 - Progressing by Galen Manila, RN Ability to verbalize feelings will improve 12/13/2017 2143 - Progressing by Galen Manila, RN Health Behavior/Discharge Planning: Ability to make decisions will improve 12/13/2017 2143 - Progressing  by Galen Manila, RN Compliance with therapeutic regimen will improve 12/13/2017 2143 - Progressing by Galen Manila, RN Role Relationship: Ability to demonstrate positive changes in social behaviors and relationships will improve 12/13/2017 2143 - Progressing by Galen Manila, RN Safety: Ability to disclose and discuss suicidal ideas will improve 12/13/2017 2143 - Progressing by Galen Manila, RN Ability to identify and utilize support systems that promote safety will improve 12/13/2017 2143 - Progressing by Galen Manila, RN Self-Concept: Ability to verbalize positive feelings about self will improve 12/13/2017 2143 - Progressing by Galen Manila, RN Level of anxiety will decrease 12/13/2017 2143 - Progressing by Galen Manila, RN

## 2017-12-13 NOTE — [facility] Group Notes (Signed)
12/13/2017  Time: 0930  Type of Therapy/Topic:  Group Therapy:  Balance in Life  Participation Level:  Minimal  Description of Group:   This group will address the concept of balance and how it feels and looks when one is unbalanced. Patients will be encouraged to process areas in their lives that are out of balance and identify reasons for remaining unbalanced. Facilitators will guide patients in utilizing problem-solving interventions to address and correct the stressor making their life unbalanced. Understanding and applying boundaries will be explored and addressed for obtaining and maintaining a balanced life. Patients will be encouraged to explore ways to assertively make their unbalanced needs known to significant others in their lives, using other group members and facilitator for support and feedback.  Therapeutic Goals: 1. Patient will identify two or more emotions or situations they have that consume much of in their lives. 2. Patient will identify signs/triggers that life has become out of balance:  3. Patient will identify two ways to set boundaries in order to achieve balance in their lives:  4. Patient will demonstrate ability to communicate their needs through discussion and/or role plays  Summary of Patient Progress: Pt continues to work towards their tx goals but has not yet reached them. Pt was able to appropriately participate in group discussion, and was able to offer support/validation to other group members. Pt reported she is feeling, "upbeat today because the ECT is working." Pt reported two emotions that have consumed too much of her life are, "anxiety and depression." Pt reported one way she can tell her life has become out of balance is, "I'm down and crying." Pt reported one way she can maintain a better balance in life is to, "speak up when I'm feeling upset."  Therapeutic Modalities:   Cognitive Behavioral Therapy Solution-Focused Therapy Assertiveness  Training  Heidi Dach, MSW, LCSW 12/13/2017 10:29 AM

## 2017-12-13 NOTE — Progress Notes (Signed)
Recreation Therapy Notes   Date: 01.31.2018  Time: 3:00pm  Location: Craft room  Behavioral response: Appropriate  Group Type: Craft  Participation level: Active  Communication: Patient was social with peers and staff during group.   Comments: N/A  Tyrone Pautsch LRT/CTRS        Jossalyn Forgione 12/13/2017 4:09 PM

## 2017-12-13 NOTE — [facility] Group Notes (Signed)
[facility] Group Notes:  (Nursing/MHT/Case Management/Adjunct)  Date:  12/13/2017  Time:  9:57 PM  Type of Therapy:  Group Therapy  Participation Level:  Active  Participation Quality:  Appropriate  Affect:  Appropriate  Cognitive:  Appropriate  Insight:  Appropriate  Engagement in Group:  Engaged  Modes of Intervention:  Discussion  Summary of Progress/Problems:  Julia Wheeler 12/13/2017, 9:57 PM

## 2017-12-13 NOTE — [facility] Group Notes (Signed)
[facility] Group Notes:  (Nursing/MHT/Case Management/Adjunct)  Date:  12/13/2017  Time:  2:35 PM  Type of Therapy:  Psychoeducational Skills  Participation Level:  Active  Participation Quality:  Appropriate  Affect:  Appropriate  Cognitive:  Appropriate  Insight:  Appropriate  Engagement in Group:  Engaged  Modes of Intervention:  Discussion, Education and Support  Summary of Progress/Problems:  Lynelle Smoke Anselmo Reihl 12/13/2017, 2:35 PM

## 2017-12-13 NOTE — Progress Notes (Addendum)
Patient found in day room upon my arrival. Patient is visible and somewhat social throughout the evening. Attended group. Reports eating and voiding adequately. Denies pain. Denies SI, HI, AVH, and depression/anxiety. Reports improvement in mood. Affect is brighter. Patient is improving in all areas. Compliant with HS medications and staff direction. Q 15 minute checks maintained. Will continue to monitor throughout the shift. Patient slept 7 hours. No apparent distress. Patient has been NPO for expected ECT Tx this am. CBG 82. Will endorse care to oncoming shift.

## 2017-12-13 NOTE — Progress Notes (Signed)
Recreation Therapy Notes   Date: 01.31.2019  Time: 1:00 PM  Location: Craft Room  Behavioral response: Appropriate  Intervention Topic: Communication  Discussion/Intervention: Group content today was focused on communication. The group defined communication and ways to communicate with others. Individuals stated reason why communication is important and some reasons to communicate with others. Patients expressed if they thought they were good at communicating with others and ways they could improve their communication skills. The group identified important parts of communication and some experiences they have had in the past with communication. The group participated in the intervention "What is that", where participants had a chance to test out their communication skills and identify ways to improve their communication techniques.  Clinical Observations/Feedback:  Patient came to group and defined communication as two or more people exchanging ideas. She stated an important part of communication is paraphrasing because it breaks down. Individual was social with peers and staff while participating  in the intervention.  Altariq Goodall LRT/CTRS         Juno Alers 12/13/2017 2:20 PM
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
## 2017-12-14 ENCOUNTER — Inpatient Hospital Stay: Payer: No Typology Code available for payment source | Admitting: Anesthesiology

## 2017-12-14 ENCOUNTER — Other Ambulatory Visit: Payer: Self-pay | Admitting: Psychiatry

## 2017-12-14 LAB — GLUCOSE, CAPILLARY
GLUCOSE-CAPILLARY: 82 mg/dL (ref 65–99)
Glucose-Capillary: 82 mg/dL (ref 65–99)

## 2017-12-14 MED ORDER — SUCCINYLCHOLINE CHLORIDE 200 MG/10ML IV SOSY
PREFILLED_SYRINGE | INTRAVENOUS | Status: DC | PRN
  Administered 2017-12-14: 80 mg via INTRAVENOUS

## 2017-12-14 MED ORDER — METHOHEXITAL SODIUM 100 MG/10ML IV SOSY
PREFILLED_SYRINGE | INTRAVENOUS | Status: DC | PRN
  Administered 2017-12-14: 60 mg via INTRAVENOUS

## 2017-12-14 MED ORDER — SODIUM CHLORIDE 0.9 % IV SOLN
INTRAVENOUS | Status: DC | PRN
  Administered 2017-12-14: 11:00:00 via INTRAVENOUS

## 2017-12-14 MED ORDER — ONDANSETRON HCL 4 MG/2ML IJ SOLN
4.0000 mg | Freq: Once | INTRAMUSCULAR | Status: AC
  Administered 2017-12-14: 4 mg via INTRAVENOUS

## 2017-12-14 MED ORDER — ONDANSETRON HCL 4 MG/2ML IJ SOLN
INTRAMUSCULAR | Status: AC
  Administered 2017-12-14: 4 mg via INTRAVENOUS
  Filled 2017-12-14: qty 2

## 2017-12-14 MED ORDER — SODIUM CHLORIDE 0.9 % IV SOLN
500.0000 mL | Freq: Once | INTRAVENOUS | Status: AC
  Administered 2017-12-14: 500 mL via INTRAVENOUS

## 2017-12-14 MED ORDER — SUCCINYLCHOLINE CHLORIDE 20 MG/ML IJ SOLN
INTRAMUSCULAR | Status: AC
  Filled 2017-12-14: qty 1

## 2017-12-14 NOTE — Anesthesia Post-op Follow-up Note (Signed)
Anesthesia QCDR form completed.        

## 2017-12-14 NOTE — [facility] Counselor (Signed)
CSW spoke to patient about her discharge plans and answered questions that she had before she went to ECT treatment.   [name], MSW, [place], LCASA 12/14/2017 10:27 AM

## 2017-12-14 NOTE — Tx Team (Signed)
Interdisciplinary Treatment and Diagnostic Plan Update  12/14/2017 Time of Session: 1;30PM Julia Wheeler MRN: [id number]  Principal Diagnosis: Severe recurrent major depression with psychotic features [facility])  Secondary Diagnoses: Principal Problem:   Severe recurrent major depression with psychotic features (HCC) Active Problems:   Sickle cell trait (HCC)   Nausea with vomiting   Essential hypertension   PTSD (post-traumatic stress disorder)   Alcohol use disorder, moderate, dependence (HCC)   Current Medications:  Current Facility-Administered Medications  Medication Dose Route Frequency Provider Last Rate Last Dose  . acetaminophen (TYLENOL) tablet 650 mg  650 mg Oral Q6H PRN Clapacs, Jackquline Denmark, MD   650 mg at 12/03/17 0006  . alum & mag hydroxide-simeth (MAALOX/MYLANTA) 200-200-20 MG/5ML suspension 30 mL  30 mL Oral Q4H PRN Clapacs, [name], MD   30 mL at 12/06/17 1336  . ARIPiprazole (ABILIFY) tablet 10 mg  10 mg Oral Daily Clapacs, Jackquline Denmark, MD   10 mg at 12/13/17 0901  . atorvastatin (LIPITOR) tablet 10 mg  10 mg Oral QHS Clapacs, Jackquline Denmark, MD   10 mg at 12/13/17 2108  . busPIRone (BUSPAR) tablet 10 mg  10 mg Oral TID PRN Aundria Rud, MD   10 mg at 12/09/17 1653  . carvedilol (COREG) tablet 6.25 mg  6.25 mg Oral BID WC Clapacs, [name], MD   6.25 mg at 12/12/17 1706  . cyproheptadine (PERIACTIN) 4 MG tablet 2 mg  2 mg Oral TID Beverly Sessions, MD   2 mg at 12/13/17 1724  . docusate sodium (COLACE) capsule 100 mg  100 mg Oral BID Clapacs, Jackquline Denmark, MD   100 mg at 12/13/17 1723  . feeding supplement (BOOST / RESOURCE BREEZE) liquid 2 Container  2 Container Oral TID WC Clapacs, Jackquline Denmark, MD   2 Container at 12/13/17 1252  . ferrous sulfate tablet 325 mg  325 mg Oral Daily Clapacs, [name], MD   325 mg at 12/13/17 0901  . haloperidol (HALDOL) tablet 5 mg  5 mg Oral Q6H PRN Clapacs, [name], MD      . loratadine (CLARITIN) tablet 10 mg  10 mg Oral Daily Clapacs, Jackquline Denmark, MD   10 mg at  12/13/17 0901  . magnesium hydroxide (MILK OF MAGNESIA) suspension 30 mL  30 mL Oral Daily PRN Clapacs, Jackquline Denmark, MD   30 mL at 12/01/17 0852  . mirtazapine (REMERON) tablet 15 mg  15 mg Oral QHS Beverly Sessions, MD   15 mg at 12/13/17 2108  . multivitamin with minerals tablet 1 tablet  1 tablet Oral Daily Clapacs, Jackquline Denmark, MD   1 tablet at 12/13/17 0900  . ondansetron (ZOFRAN) injection 4 mg  4 mg Intravenous Once Clapacs, [name], MD      . pantoprazole (PROTONIX) EC tablet 40 mg  40 mg Oral BID Piscoya, Jose, MD   40 mg at 12/13/17 1723  . polyethylene glycol (MIRALAX / GLYCOLAX) packet 17 g  17 g Oral Daily Piscoya, Jose, MD   17 g at 12/12/17 1254  . promethazine (PHENERGAN) tablet 12.5 mg  12.5 mg Oral Q6H PRN Clapacs, Jackquline Denmark, MD   12.5 mg at 11/29/17 1509  . sertraline (ZOLOFT) tablet 200 mg  200 mg Oral Daily Clapacs, [name], MD   200 mg at 12/13/17 0901  . traZODone (DESYREL) tablet 150 mg  150 mg Oral QHS Clapacs, Jackquline Denmark, MD   150 mg at 12/13/17 2108  . triamterene-hydrochlorothiazide (MAXZIDE-25) 37.5-25 MG  per tablet 2 tablet  2 tablet Oral Daily Clapacs, Jackquline Denmark, MD   2 tablet at 12/11/17 6644   PTA Medications: Medications Prior to Admission  Medication Sig Dispense Refill Last Dose  . acetaminophen (TYLENOL) 325 MG tablet Take 2 tablets (650 mg total) by mouth every 6 (six) hours as needed for mild pain.   12/11/2017 at Unknown time  . alum & mag hydroxide-simeth (MAALOX/MYLANTA) 200-200-20 MG/5ML suspension Take 30 mLs by mouth every 4 (four) hours as needed for indigestion. 355 mL 0 12/11/2017 at Unknown time  . ARIPiprazole (ABILIFY) 10 MG tablet Take 1 tablet (10 mg total) by mouth daily. For mood control   12/11/2017 at Unknown time  . atorvastatin (LIPITOR) 10 MG tablet Take 1 tablet (10 mg total) by mouth at bedtime. For high cholesterol   12/11/2017 at Unknown time  . carvedilol (COREG) 6.25 MG tablet Take 1 tablet (6.25 mg total) by mouth 2 (two) times daily with a meal. For high  blood pressure   12/11/2017 at Unknown time  . cetirizine (ZYRTEC) 10 MG tablet Take 1 tablet (10 mg total) by mouth as needed for allergies.  2 12/11/2017 at Unknown time  . ferrous sulfate 325 (65 FE) MG tablet Take 1 tablet (325 mg total) by mouth daily. For anemia  1 12/11/2017 at Unknown time  . haloperidol (HALDOL) 5 MG tablet Take 1 tablet (5 mg total) by mouth every 6 (six) hours as needed for agitation (psychosis).   12/11/2017 at Unknown time  . hydrOXYzine (ATARAX/VISTARIL) 50 MG tablet Take 1 tablet (50 mg total) by mouth every 6 (six) hours as needed for anxiety. 30 tablet 0 12/11/2017 at Unknown time  . lamoTRIgine (LAMICTAL) 25 MG tablet Take 2 tablets (50 mg total) by mouth daily. For mood stabilization   12/11/2017 at Unknown time  . LORazepam (ATIVAN) 1 MG tablet Take 1 tablet (1 mg total) by mouth every 6 (six) hours as needed for anxiety or sleep. 1 tablet 0 12/11/2017 at Unknown time  . magnesium hydroxide (MILK OF MAGNESIA) 400 MG/5ML suspension Take 30 mLs by mouth daily as needed for mild constipation. 360 mL 0 12/11/2017 at Unknown time  . sertraline (ZOLOFT) 50 MG tablet Take 3 tablets (150 mg total) by mouth daily. For depression   12/11/2017 at Unknown time  . traZODone (DESYREL) 150 MG tablet Take 1 tablet (150 mg total) by mouth at bedtime. For sleep   12/11/2017 at Unknown time  . triamterene-hydrochlorothiazide (MAXZIDE-25) 37.5-25 MG tablet Take 2 tablets by mouth daily. For high blood pressure   12/11/2017 at Unknown time    Patient Stressors: Financial difficulties Medication change or noncompliance  Patient Strengths: Ability for insight Motivation for treatment/growth Supportive family/friends  Treatment Modalities: Medication Management, Group therapy, Case management,  1 to 1 session with clinician, Psychoeducation, Recreational therapy.   Physician Treatment Plan for Primary Diagnosis: Severe recurrent major depression with psychotic features (HCC) Long Term  Goal(s): Improvement in symptoms so as ready for discharge Improvement in symptoms so as ready for discharge   Short Term Goals: Ability to verbalize feelings will improve Ability to disclose and discuss suicidal ideas Ability to maintain clinical measurements within normal limits will improve Compliance with prescribed medications will improve  Medication Management: Evaluate patient's response, side effects, and tolerance of medication regimen.  Therapeutic Interventions: 1 to 1 sessions, Unit Group sessions and Medication administration.  Evaluation of Outcomes: Progressing  Physician Treatment Plan for Secondary Diagnosis: Principal Problem:  Severe recurrent major depression with psychotic features (HCC) Active Problems:   Sickle cell trait (HCC)   Nausea with vomiting   Essential hypertension   PTSD (post-traumatic stress disorder)   Alcohol use disorder, moderate, dependence (HCC)  Long Term Goal(s): Improvement in symptoms so as ready for discharge Improvement in symptoms so as ready for discharge   Short Term Goals: Ability to verbalize feelings will improve Ability to disclose and discuss suicidal ideas Ability to maintain clinical measurements within normal limits will improve Compliance with prescribed medications will improve     Medication Management: Evaluate patient's response, side effects, and tolerance of medication regimen.  Therapeutic Interventions: 1 to 1 sessions, Unit Group sessions and Medication administration.  Evaluation of Outcomes: Progressing   RN Treatment Plan for Primary Diagnosis: Severe recurrent major depression with psychotic features (HCC) Long Term Goal(s): Knowledge of disease and therapeutic regimen to maintain health will improve  Short Term Goals: Ability to remain free from injury will improve, Ability to demonstrate self-control, Ability to verbalize feelings will improve, Ability to disclose and discuss suicidal ideas and  Compliance with prescribed medications will improve  Medication Management: RN will administer medications as ordered by provider, will assess and evaluate patient's response and provide education to patient for prescribed medication. RN will report any adverse and/or side effects to prescribing provider.  Therapeutic Interventions: 1 on 1 counseling sessions, Psychoeducation, Medication administration, Evaluate responses to treatment, Monitor vital signs and CBGs as ordered, Perform/monitor CIWA, COWS, AIMS and Fall Risk screenings as ordered, Perform wound care treatments as ordered.  Evaluation of Outcomes: Progressing   LCSW Treatment Plan for Primary Diagnosis: Severe recurrent major depression with psychotic features (HCC) Long Term Goal(s): Safe transition to appropriate next level of care at discharge, Engage patient in therapeutic group addressing interpersonal concerns.  Short Term Goals: Engage patient in aftercare planning with referrals and resources, Increase social support, Increase ability to appropriately verbalize feelings, Increase emotional regulation, Identify triggers associated with mental health/substance abuse issues and Increase skills for wellness and recovery  Therapeutic Interventions: Assess for all discharge needs, 1 to 1 time with Social worker, Explore available resources and support systems, Assess for adequacy in community support network, Educate family and significant other(s) on suicide prevention, Complete Psychosocial Assessment, Interpersonal group therapy.  Evaluation of Outcomes: Progressing   Progress in Treatment: Attending groups: Yes. Participating in groups: Yes. Taking medication as prescribed: Yes. Toleration medication: Yes. Family/Significant other contact made: Yes, individual(s) contacted:  [employer], [phone] Patient understands diagnosis: Yes. Discussing patient identified problems/goals with staff: Yes. Medical problems  stabilized or resolved: Yes. Denies suicidal/homicidal ideation: Yes. Issues/concerns per patient self-inventory: Yes. Other:   New problem(s) identified: No, Describe:  None  New Short Term/Long Term Goal(s): "I want to understand why I tried to kill myself." Discharge Plan or Barriers: To return home to live with her son and attend outpatient treatment with [facility]  Reason for Continuation of Hospitalization: Depression Medication stabilization  Estimated Length of Stay: 3-4 days  Recreational Therapy: Patient Stressors: Depression Patient Goal: Patient will identify 3 triggers for depressive symptoms x5 days.    Attendees: Patient:  Julia Wheeler 12/14/2017 12:24 PM  Physician: Mordecai Rasmussen, MD 12/14/2017 12:24 PM  Nursing:  12/14/2017 12:24 PM  RN Care Manager: 12/14/2017 12:24 PM  Social Worker: [name], LCSWA 12/14/2017 12:24 PM  Recreational Therapist:  12/14/2017 12:24 PM  Other: 12/14/2017 12:24 PM  Other:  12/14/2017 12:24 PM  Other: 12/14/2017 12:24 PM  Scribe for Treatment Team: [name], [place] 12/14/2017 12:24 PM

## 2017-12-14 NOTE — Anesthesia Postprocedure Evaluation (Signed)
Anesthesia Post Note  Patient: Julia Wheeler  Procedure(s) Performed: ECT TX  Patient location during evaluation: PACU Anesthesia Type: General Level of consciousness: awake and alert Pain management: pain level controlled Vital Signs Assessment: post-procedure vital signs reviewed and stable Respiratory status: spontaneous breathing, nonlabored ventilation and respiratory function stable Cardiovascular status: blood pressure returned to baseline and stable Postop Assessment: no signs of nausea or vomiting Anesthetic complications: no     Last Vitals:  Vitals:   12/14/17 1151 12/14/17 1211  BP:  99/86  Pulse: (!) 104 90  Resp: 13 15  Temp:    SpO2: 99% 96%    Last Pain:  Vitals:   12/14/17 1211  TempSrc:   PainSc: 0-No pain                 Greycen Felter

## 2017-12-14 NOTE — Anesthesia Procedure Notes (Signed)
Date/Time: 12/14/2017 11:31 AM Performed by: Lily Kocher, CRNA Pre-anesthesia Checklist: Patient identified, Emergency Drugs available, Suction available and Patient being monitored Patient Re-evaluated:Patient Re-evaluated prior to induction Oxygen Delivery Method: Circle system utilized Preoxygenation: Pre-oxygenation with 100% oxygen Induction Type: IV induction Ventilation: Mask ventilation without difficulty and Mask ventilation throughout procedure Airway Equipment and Method: Bite block Placement Confirmation: positive ETCO2 Dental Injury: Teeth and Oropharynx as per pre-operative assessment
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
## 2017-12-14 NOTE — Transfer of Care (Signed)
Immediate Anesthesia Transfer of Care Note  Patient: Julia Wheeler  Procedure(s) Performed: ECT TX  Patient Location: PACU  Anesthesia Type:General  Level of Consciousness: sedated  Airway & Oxygen Therapy: Patient Spontanous Breathing and Patient connected to face mask oxygen  Post-op Assessment: Report given to RN and Post -op Vital signs reviewed and stable  Post vital signs: Reviewed and stable  Last Vitals:  Vitals:   12/14/17 1151 12/14/17 1211  BP:  99/86  Pulse: (!) 104 90  Resp: 13 15  Temp:    SpO2: 99% 96%    Last Pain:  Vitals:   12/14/17 1211  TempSrc:   PainSc: 0-No pain      Patients Stated Pain Goal: 3 (12/03/17 0006)  Complications: No apparent anesthesia complications

## 2017-12-14 NOTE — Procedures (Signed)
ECT SERVICES Physician's Interval Evaluation & Treatment Note  Patient Identification: Julia Wheeler MRN:  [id number] Date of Evaluation:  12/14/2017 TX #: 7  MADRS: 20  MMSE: 27  P.E. Findings:  No change physical exam vitals unremarkable heart and lungs normal.  Psychiatric Interval Note:  Patient's mood and affect are clearly improved.  Some memory impairment.  Still functioning well day to day  Subjective:  Patient is a 52 y.o. female seen for evaluation for Electroconvulsive Therapy. Patient acknowledges feeling better  Treatment Summary:   []   Right Unilateral             [x]  Bilateral   % Energy : 1.0 ms 25%   Impedance: 1230 ohms  Seizure Energy Index: 10,265 V squared  Postictal Suppression Index: 76%  Seizure Concordance Index: 96%  Medications  Pre Shock: Zofran 4 mg Brevital 60 mg succinylcholine 80 mg  Post Shock:    Seizure Duration: 46 seconds by EMG 64 seconds by EEG   Comments: Follow-up for Monday currently  Lungs:  [x]   Clear to auscultation               []  Other:   Heart:    [x]   Regular rhythm             []  irregular rhythm    [x]   Previous H&P reviewed, patient examined and there are NO CHANGES                 []   Previous H&P reviewed, patient examined and there are changes noted.   Mordecai Rasmussen, MD 2/1/201911:24 AM

## 2017-12-14 NOTE — [facility] Group Notes (Signed)
12/14/2017 9:30AM Type of Therapy and Topic:  Group Therapy:  Feelings around Relapse and Recovery  Participation Level:  Did Not Attend   Description of Group:    Patients in this group will discuss emotions they experience before and after a relapse. They will process how experiencing these feelings, or avoidance of experiencing them, relates to having a relapse. Facilitator will guide patients to explore emotions they have related to recovery. Patients will be encouraged to process which emotions are more powerful. They will be guided to discuss the emotional reaction significant others in their lives may have to patients' relapse or recovery. Patients will be assisted in exploring ways to respond to the emotions of others without this contributing to a relapse.  Therapeutic Goals: 1. Patient will identify two or more emotions that lead to a relapse for them 2. Patient will identify two emotions that result when they relapse 3. Patient will identify two emotions related to recovery 4. Patient will demonstrate ability to communicate their needs through discussion and/or role plays   Summary of Patient Progress: Patient was encouraged and invited to attend group. Patient did not attend group. Social worker will continue to encourage group participation in the future.     Therapeutic Modalities:   Cognitive Behavioral Therapy Solution-Focused Therapy Assertiveness Training Relapse Prevention Therapy   [name], LCSW 12/14/2017 10:25 AM

## 2017-12-14 NOTE — Progress Notes (Signed)
Recreation Therapy Notes  Date: 02.01.2019  Time: 1:00 PM  Location: Craft Room  Behavioral response: Appropriate   Intervention Topic: Leisure  Discussion/Intervention: Group content today was focused on leisure. The group defined what leisure is and some positive leisure activities they participate in. Individuals identified the difference between good and bad leisure. Participants expressed how they feel after participating in the leisure of their choice. The group discussed how they go about picking a leisure activity and if others are involved in their leisure activities. The patient stated how many leisure activities they too choose from and reasons why it is important to have leisure time. Individuals participated in the intervention "Leisure Jeopardy" where they had a chance to identify new leisure activities as well as benefits of leisure. Clinical Observations/Feedback:  Patient came to group and stated she likes to spend her free time going to the movies. She participated in the intervention and continues to make progress towards her goals.  Hy Swiatek LRT/CTRS         Bronte Kropf 12/14/2017 2:02 PM

## 2017-12-14 NOTE — [facility] Group Notes (Signed)
[facility] Group Notes:  (Nursing/MHT/Case Management/Adjunct)  Date:  12/14/2017  Time:  3:06 PM  Type of Therapy:  Psychoeducational Skills  Participation Level:  Minimal  Participation Quality:  Appropriate  Affect:  Flat  Cognitive:  Confused  Insight:  Lacking  Engagement in Group:  Engaged  Modes of Intervention:  Discussion  Summary of Progress/Problems:  Julia Wheeler 12/14/2017, 3:06 PM

## 2017-12-14 NOTE — Plan of Care (Signed)
  Education: Knowledge of [facility] Education information/materials will improve 12/14/2017 1556 - Progressing by Elige Radon, RN   Activity: Sleeping patterns will improve 12/14/2017 1556 - Progressing by Elige Radon, RN   Coping: Ability to cope will improve 12/14/2017 1556 - Progressing by Elige Radon, RN   Medication: Compliance with prescribed medication regimen will improve 12/14/2017 1556 - Progressing by Elige Radon, RN   Self-Concept: Ability to disclose and discuss suicidal ideas will improve 12/14/2017 1556 - Progressing by Elige Radon, RN Note Denies SI Ability to verbalize positive feelings about self will improve 12/14/2017 1556 - Progressing by Elige Radon, RN  Patient is pleasant and cooperative.  Denies SI/HI/AVH. Affect brighter.  Smiles with engagement.  Medication and group compliant.  Visible in the milieu although has minimal interaction with peers.  ECT today.  Tolerated well except for minor headache.  Support and encouragement offered.  Safety rounds maintained on the unit.
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
## 2017-12-15 NOTE — Plan of Care (Signed)
Pt. Verbalizes understanding of provided education. Pt. Reports feeling, "a little better" today when asked how she is doing. Pt. Compliant with medications and unit procedures. Pt. Attends groups. Pt. Reports sleeping, "good", but eating, "not so good". Pt. Denies SI/HI. Pt. Verbally contracts for safety. Pt. States she can remain safe while on the unit.  Progressing Education: Knowledge of [place] [facility] Education information/materials will improve 12/15/2017 0011 - Progressing by Lenox Ponds, RN Coping: Ability to cope will improve 12/15/2017 0011 - Progressing by Lenox Ponds, RN Medication: Compliance with prescribed medication regimen will improve 12/15/2017 0011 - Progressing by Lenox Ponds, RN Self-Concept: Ability to disclose and discuss suicidal ideas will improve 12/15/2017 0011 - Progressing by Lenox Ponds, RN Ability to verbalize positive feelings about self will improve 12/15/2017 0011 - Progressing by Lenox Ponds, RN Safety: Ability to remain free from injury will improve 12/15/2017 0011 - Progressing by Lenox Ponds, RN [facility] Participation in Recreation Therapeutic Interventions STG-Other Recreation Therapy Goal (Specify) Description Patient will identify 3 triggers for depressive symptoms x5 days.   12/15/2017 0011 - Progressing by Lenox Ponds, RN Activity: Sleeping patterns will improve 12/15/2017 0011 - Progressing by Lenox Ponds, RN Activity: Interest or engagement in leisure activities will improve 12/15/2017 0011 - Progressing by Lenox Ponds, RN Imbalance in normal sleep/wake cycle will improve 12/15/2017 0011 - Progressing by Lenox Ponds, RN Education: Knowledge of the prescribed therapeutic regimen will improve 12/15/2017 0011 - Progressing by Lenox Ponds, RN Health Behavior/Discharge Planning: Compliance with therapeutic regimen will improve 12/15/2017 0011 - Progressing by Lenox Ponds, RN Safety: Ability to disclose  and discuss suicidal ideas will improve 12/15/2017 0011 - Progressing by Lenox Ponds, RN

## 2017-12-15 NOTE — Progress Notes (Signed)
D:Pt denies SI/HI/AVH. Pt is pleasant and cooperative. Pt. reports anxiety and depression today at a 5/10 for both.  Patient Interaction is minimal, but does engage. Pt. Reports feeling, "a little better" today when asked how she is doing. Pt. Reports sleeping, "good", but eating, "not so good". Pt. Verbally contracts for safety. Pt. States she can remain safe while on the unit.   A: Q x 15 minute observation checks were completed for safety. Patient was provided with education.Pt. Verbalizes understanding of provided education. Patient was given scheduled medications. Patient  was encourage to attend groups, participate in unit activities and continue with plan of care.   R:Patient is complaint with medication and unit procedures. Pt. Attends groups.             Patient slept for Estimated Hours of 8.15; Precautionary checks every 15 minutes for safety maintained, room free of safety hazards, patient sustains no injury or falls during this shift.

## 2017-12-15 NOTE — [facility] Group Notes (Addendum)
LCSW Group Therapy Note  12/15/2017 1:15pm  Type of Therapy and Topic: Group Therapy: Holding on to Grudges   Participation Level: Minimal   Description of Group:  In this group patients will be asked to explore and define a grudge. Patients will be guided to discuss their thoughts, feelings, and reasons as to why people have grudges. Patients will process the impact grudges have on daily life and identify thoughts and feelings related to holding grudges. Facilitator will challenge patients to identify ways to let go of grudges and the benefits this provides. Patients will be confronted to address why one struggles letting go of grudges. Lastly, patients will identify feelings and thoughts related to what life would look like without grudges. This group will be process-oriented, with patients participating in exploration of their own experiences, giving and receiving support, and processing challenge from other group members.  Therapeutic Goals:  1. Patient will identify specific grudges related to their personal life.  2. Patient will identify feelings, thoughts, and beliefs around grudges.  3. Patient will identify how one releases grudges appropriately.  4. Patient will identify situations where they could have let go of the grudge, but instead chose to hold on.   Summary of Patient Progress: Pt scored her mood 7 (10 best). Pt shared that she does not hold any grudges with people, that she tries to stay away from trouble.   Therapeutic Modalities:  Cognitive Behavioral Therapy  Solution Focused Therapy  Motivational Interviewing  Brief Therapy   Sandford Diop  [name], LCSW 12/15/2017 12:52 PM

## 2017-12-15 NOTE — Plan of Care (Signed)
Patient is alert and oriented X 4. Patient denies SI, HI and AVH. Patient affect is pleasant. Patient is compliant on the unit with peers and staff. Patient attends groups and is active with treatment program. Nurse will continue to monitor. Safety checks Q 15 minutes. Education: Knowledge of [facility] Education information/materials will improve 12/15/2017 1021 - Progressing by Leamon Arnt, RN   Activity: Sleeping patterns will improve 12/15/2017 1021 - Progressing by Leamon Arnt, RN   Coping: Ability to cope will improve 12/15/2017 1021 - Progressing by Leamon Arnt, RN   Medication: Compliance with prescribed medication regimen will improve 12/15/2017 1021 - Progressing by Leamon Arnt, RN

## 2017-12-15 NOTE — [facility] Group Notes (Signed)
[facility] Group Notes:  (Nursing/MHT/Case Management/Adjunct)  Date:  12/15/2017  Time:  5:06 AM  Type of Therapy:  Psychoeducational Skills  Participation Level:  Active  Participation Quality:  Appropriate, Attentive and Sharing  Affect:  Appropriate  Cognitive:  Appropriate  Insight:  Appropriate and Good  Engagement in Group:  Engaged  Modes of Intervention:  Discussion, Socialization and Support  Summary of Progress/Problems:  Julia Wheeler 12/15/2017, 5:06 AM

## 2017-12-15 NOTE — [facility] Group Notes (Signed)
[facility] Group Notes:  (Nursing/MHT/Case Management/Adjunct)  Date:  12/15/2017  Time:  11:54 PM  Type of Therapy:  Group Therapy  Participation Level:  Active  Participation Quality:  Supportive  Affect:  Appropriate  Cognitive:  Alert  Insight:  Appropriate  Engagement in Group:  Engaged  Modes of Intervention:  Activity  Summary of Progress/Problems:  Julia Wheeler 12/15/2017, 11:54 PM
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
## 2017-12-16 MED ORDER — SODIUM CHLORIDE 0.9 % IV SOLN
500.0000 mL | Freq: Once | INTRAVENOUS | Status: AC
  Administered 2017-12-17: 500 mL via INTRAVENOUS

## 2017-12-16 MED ORDER — ONDANSETRON HCL 4 MG/2ML IJ SOLN: 4.0000 mg | Freq: Once | INTRAMUSCULAR | Status: DC

## 2017-12-16 NOTE — Plan of Care (Signed)
  Education: Knowledge of [facility] Education information/materials will improve 12/16/2017 1152 - Adequate for Discharge by Elige Radon, RN   Activity: Sleeping patterns will improve 12/16/2017 1152 - Adequate for Discharge by Elige Radon, RN   Coping: Ability to cope will improve 12/16/2017 1152 - Progressing by Elige Radon, RN Note Pleasant and cooperative.  Affect brighter.  More visible in the milieu.  Verbalizing feelings and concerns.   Medication: Compliance with prescribed medication regimen will improve 12/16/2017 1152 - Progressing by Elige Radon, RN Note Medication compliant   Self-Concept: Ability to disclose and discuss suicidal ideas will improve 12/16/2017 1152 - Progressing by Elige Radon, RN Note Denies SI or any thoughts of self harm Ability to verbalize positive feelings about self will improve 12/16/2017 1152 - Progressing by Elige Radon, RN   Safety: Ability to remain free from injury will improve 12/16/2017 1152 - Progressing by Elige Radon, RN Note Remains safe on the unit   [facility] Participation in Recreation Therapeutic Interventions STG-Other Recreation Therapy Goal (Specify) Description Patient will identify 3 triggers for depressive symptoms x5 days.   12/16/2017 1152 - Progressing by Elige Radon, RN   Spiritual Needs Ability to function at adequate level 12/16/2017 1152 - Progressing by Elige Radon, RN Note Visible in the milieu,  affect brighter.  Appetite improved, drinking supplements.  Attending groups.   Activity: Interest or engagement in leisure activities will improve 12/16/2017 1152 - Progressing by Elige Radon, RN Note Attending groups, up for meals, visible in the milieu.   Imbalance in normal sleep/wake cycle will improve 12/16/2017 1152 - Adequate for Discharge by Elige Radon, RN   Education: Utilization of techniques to improve thought processes will improve 12/16/2017 1152 - Adequate for  Discharge by Elige Radon, RN Knowledge of the prescribed therapeutic regimen will improve 12/16/2017 1152 - Adequate for Discharge by Elige Radon, RN   Coping: Ability to cope will improve 12/16/2017 1152 - Adequate for Discharge by Elige Radon, RN Ability to verbalize feelings will improve 12/16/2017 1152 by Elige Radon, RN Note Verbalizes feelings without difficulty   Health Behavior/Discharge Planning: Ability to make decisions will improve 12/16/2017 1152 - Adequate for Discharge by Elige Radon, RN Compliance with therapeutic regimen will improve 12/16/2017 1152 - Adequate for Discharge by Elige Radon, RN   Role Relationship: Ability to demonstrate positive changes in social behaviors and relationships will improve 12/16/2017 1152 - Adequate for Discharge by Elige Radon, RN   Safety: Ability to disclose and discuss suicidal ideas will improve 12/16/2017 1152 - Progressing by Elige Radon, RN Note Denies SI Ability to identify and utilize support systems that promote safety will improve 12/16/2017 1152 - Adequate for Discharge by Elige Radon, RN   Self-Concept: Ability to verbalize positive feelings about self will improve 12/16/2017 1152 - Adequate for Discharge by Elige Radon, RN Level of anxiety will decrease 12/16/2017 1152 - Adequate for Discharge by Elige Radon, RN

## 2017-12-16 NOTE — [facility] Group Notes (Signed)
LCSW Group Therapy Note 12/16/2017 1:15pm  Type of Therapy and Topic: Group Therapy: Feelings Around Returning Home & Establishing a Supportive Framework and Supporting Oneself When Supports Not Available  Participation Level: Minimal  Description of Group:  Patients first processed thoughts and feelings about upcoming discharge. These included fears of upcoming changes, lack of change, new living environments, judgements and expectations from others and overall stigma of mental health issues. The group then discussed the definition of a supportive framework, what that looks and feels like, and how do to discern it from an unhealthy non-supportive network. The group identified different types of supports as well as what to do when your family/friends are less than helpful or unavailable  Therapeutic Goals  1. Patient will identify one healthy supportive network that they can use at discharge. 2. Patient will identify one factor of a supportive framework and how to tell it from an unhealthy network. 3. Patient able to identify one coping skill to use when they do not have positive supports from others. 4. Patient will demonstrate ability to communicate their needs through discussion and/or role plays.  Summary of Patient Progress:  Pt engaged during group session. As patients processed their anxiety about discharge and described healthy supports patient shared that she wants to complete ECT treatment before she thinks about going home. Pt reported she feels much better.  Patients identified at least one self-care tool they were willing to use after discharge.   Therapeutic Modalities Cognitive Behavioral Therapy Motivational Interviewing   Kersti Scavone  [name], LCSW 12/16/2017 12:04 PM
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
## 2017-12-16 NOTE — Progress Notes (Signed)
D:Pt denies SI/HI/AVH. Pt. Verbally contracts for safety. Pt. States she can remain safe while on the unit. Pt. Reports feeling, "a little bit better, but kind of the same" then she did yesterday.  Pt. Reports sleeping, "good". Pt. Appetite is improving and it's fairly adequate. Pt. Has no complaints this evening. Pt. Minimal during assessment, but does cooperative and engage. Pt. Reports anxiety and depression at a 6/10 for both when asked.    A: Q x 15 minute observation checks were completed for safety. Patient was provided with education. Pt. Verbalizes understanding of provided education.Patient was given scheduled medications. Patient  was encourage to attend groups, participate in unit activities and continue with plan of care. Pt. Frequently seen in the day room watching tv.   R:Patient is complaint with medication and unit procedures. Pt. Attends groups.             Patient slept for Estimated Hours of 4.30; Precautionary checks every 15 minutes for safety maintained, room free of safety hazards, patient sustains no injury or falls during this shift.

## 2017-12-16 NOTE — Plan of Care (Signed)
Pt. Verbalizes understanding of provided education. Pt. Compliant with medications. Pt. Denies SI/HI. Pt. Verbally contracts for safety. Pt. States she can remain safe while on the unit. Pt. Reports feeling, "a little bit better, but kind of the same" then she did yesterday. Pt. Compliant with medications. Pt. Participates in groups and unit activities. Pt. Reports sleeping, "good".  Progressing Education: Knowledge of [facility] Education information/materials will improve 12/16/2017 2311 - Progressing by Lenox Ponds, RN Coping: Ability to cope will improve 12/16/2017 2311 - Progressing by Lenox Ponds, RN Medication: Compliance with prescribed medication regimen will improve 12/16/2017 2311 - Progressing by Lenox Ponds, RN Self-Concept: Ability to disclose and discuss suicidal ideas will improve 12/16/2017 2311 - Progressing by Lenox Ponds, RN Ability to verbalize positive feelings about self will improve 12/16/2017 2311 - Progressing by Lenox Ponds, RN Safety: Ability to remain free from injury will improve 12/16/2017 2311 - Progressing by Lenox Ponds, RN [facility] Participation in Recreation Therapeutic Interventions STG-Other Recreation Therapy Goal (Specify) Description Patient will identify 3 triggers for depressive symptoms x5 days.   12/16/2017 2311 - Progressing by Lenox Ponds, RN Activity: Sleeping patterns will improve 12/16/2017 2311 - Progressing by Lenox Ponds, RN Activity: Interest or engagement in leisure activities will improve 12/16/2017 2311 - Progressing by Lenox Ponds, RN Imbalance in normal sleep/wake cycle will improve 12/16/2017 2311 - Progressing by Lenox Ponds, RN Education: Knowledge of the prescribed therapeutic regimen will improve 12/16/2017 2311 - Progressing by Lenox Ponds, RN Coping: Ability to cope will improve 12/16/2017 2311 - Progressing by Lenox Ponds, RN Ability to verbalize feelings will  improve 12/16/2017 2311 - Progressing by Lenox Ponds, RN Health Behavior/Discharge Planning: Compliance with therapeutic regimen will improve 12/16/2017 2311 - Progressing by Lenox Ponds, RN Safety: Ability to disclose and discuss suicidal ideas will improve 12/16/2017 2311 - Progressing by Lenox Ponds, RN Self-Concept: Ability to verbalize positive feelings about self will improve 12/16/2017 2311 - Progressing by Lenox Ponds, RN

## 2017-12-17 ENCOUNTER — Inpatient Hospital Stay: Payer: No Typology Code available for payment source

## 2017-12-17 ENCOUNTER — Inpatient Hospital Stay: Payer: No Typology Code available for payment source | Admitting: Anesthesiology

## 2017-12-17 LAB — GLUCOSE, CAPILLARY
GLUCOSE-CAPILLARY: 86 mg/dL (ref 65–99)
Glucose-Capillary: 76 mg/dL (ref 65–99)

## 2017-12-17 MED ORDER — ONDANSETRON HCL 4 MG/2ML IJ SOLN: 4.0000 mg | Freq: Once | INTRAMUSCULAR | Status: DC | PRN

## 2017-12-17 MED ORDER — FENTANYL CITRATE (PF) 100 MCG/2ML IJ SOLN: 25.0000 ug | INTRAMUSCULAR | Status: DC | PRN

## 2017-12-17 MED ORDER — METHOHEXITAL SODIUM 100 MG/10ML IV SOSY
PREFILLED_SYRINGE | INTRAVENOUS | Status: DC | PRN
  Administered 2017-12-17: 60 mg via INTRAVENOUS

## 2017-12-17 MED ORDER — SUCCINYLCHOLINE CHLORIDE 20 MG/ML IJ SOLN
INTRAMUSCULAR | Status: DC | PRN
  Administered 2017-12-17: 80 mg via INTRAVENOUS
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
## 2017-12-17 NOTE — Procedures (Signed)
ECT SERVICES Physician's Interval Evaluation & Treatment Note  Patient Identification: Julia Wheeler MRN:  [id number] Date of Evaluation:  12/17/2017 TX #: 7  MADRS: 20  MMSE: 27  P.E. Findings:  No change to physical exam  Psychiatric Interval Note:  Mood is clearly improved.  Memory is a little impaired.  Subjective:  Patient is a 52 y.o. female seen for evaluation for Electroconvulsive Therapy. Follow-up is going to be outpatient.  She is feeling much better.  Treatment Summary:   []   Right Unilateral             [x]  Bilateral   % Energy : 1.0 ms 25%   Impedance: 1660 ohms  Seizure Energy Index: 90,163 V squared  Postictal Suppression Index: 93%  Seizure Concordance Index: 97%  Medications  Pre Shock: Zofran 4 mg Brevital 60 mg succinylcholine 80 mg  Post Shock:    Seizure Duration: 40 seconds EMG 47 seconds EEG   Comments: Last treatment in this series.  Likely discharge tomorrow.  Lungs:  [x]   Clear to auscultation               []  Other:   Heart:    [x]   Regular rhythm             []  irregular rhythm    [x]   Previous H&P reviewed, patient examined and there are NO CHANGES                 []   Previous H&P reviewed, patient examined and there are changes noted.   Mordecai Rasmussen, MD 2/4/201910:58 AM

## 2017-12-17 NOTE — Plan of Care (Signed)
Patient has ECT scheduled today. Affect is brighter and patient engages more in group settings and actively participates. Endorses some depression and anxiety and rates both a 6. Denies pain and states that she will talk with staff today if need be to help with her coping mechanisms. Milieu remains safe with q 15  minute safety checks.

## 2017-12-17 NOTE — Anesthesia Post-op Follow-up Note (Signed)
Anesthesia QCDR form completed.        

## 2017-12-17 NOTE — [facility] Group Notes (Signed)
12/17/2017  Time: 0930   Type of Therapy and Topic:  Group Therapy:  Overcoming Obstacles   Participation Level:  Did Not Attend   Description of Group:   In this group patients will be encouraged to explore what they see as obstacles to their own wellness and recovery. They will be guided to discuss their thoughts, feelings, and behaviors related to these obstacles. The group will process together ways to cope with barriers, with attention given to specific choices patients can make. Each patient will be challenged to identify changes they are motivated to make in order to overcome their obstacles. This group will be process-oriented, with patients participating in exploration of their own experiences, giving and receiving support, and processing challenge from other group members.   Therapeutic Goals: 1. Patient will identify personal and current obstacles as they relate to admission. 2. Patient will identify barriers that currently interfere with their wellness or overcoming obstacles.  3. Patient will identify feelings, thought process and behaviors related to these barriers. 4. Patient will identify two changes they are willing to make to overcome these obstacles:      Summary of Patient Progress Pt was invited to attend group but chose not to attend. CSW will continue to encourage pt to attend group throughout their admission.     Therapeutic Modalities:   Cognitive Behavioral Therapy Solution Focused Therapy Motivational Interviewing Relapse Prevention Therapy  Heidi Dach, MSW, LCSW 12/17/2017 10:26 AM

## 2017-12-17 NOTE — Progress Notes (Signed)
Recreation Therapy Notes  Date: 02.04.2019  Time: 1:00PM  Location: Craft Room  Behavioral response: Appropriate   Intervention Topic: Goals  Discussion/Intervention: Group content on today was focused on goals. Patients described what goals are and how they define goals. Individuals expressed how they go about setting goals and reaching them. The group identified how important goals are and if they make short term goals to reach long term goals. Patients described how many goals they work on at a time and what affects them not reaching their goal. Individuals described how much time they put into planning and obtaining their goals. The group participated in the intervention "My Goal Board" and made personal goal boards to help them achieve their goal. Clinical Observations/Feedback:  Patient came to group and described goals as a road map to know where you are going. Individual was focused on what her peers and staff had to say about the topic at hand. She participated in the intervention and continues to make progress towards her goals.  Jackolyn Geron LRT/CTRS         Harlen Danford 12/17/2017 1:58 PM

## 2017-12-17 NOTE — Progress Notes (Signed)
Patient returned to unit after having ECT procedure done. Patient alert and oriented x 4. Ambulatory with a steady gait.  Food and po fluids provided to patient. Ate meal without difficulty, after meal pt observed ambulating the unit with a bright affect. Will continue to monitor.
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
## 2017-12-17 NOTE — Transfer of Care (Signed)
Immediate Anesthesia Transfer of Care Note  Patient: Julia Wheeler  Procedure(s) Performed: ECT TX  Patient Location: PACU  Anesthesia Type:General  Level of Consciousness: sedated  Airway & Oxygen Therapy: Patient connected to face mask oxygen  Post-op Assessment: Post -op Vital signs reviewed and stable  Post vital signs: stable  Last Vitals:  Vitals:   12/17/17 0914 12/17/17 1112  BP: 110/61 108/87  Pulse: 71   Resp: 16   Temp: 36.6 C   SpO2: 99% 100%    Last Pain:  Vitals:   12/17/17 0914  TempSrc: Oral  PainSc:       Patients Stated Pain Goal: 3 (12/03/17 0006)  Complications: No apparent anesthesia complications
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
## 2017-12-17 NOTE — Anesthesia Postprocedure Evaluation (Signed)
Anesthesia Post Note  Patient: Julia Wheeler  Procedure(s) Performed: ECT TX  Patient location during evaluation: PACU Anesthesia Type: General Level of consciousness: awake and alert and oriented Pain management: pain level controlled Vital Signs Assessment: post-procedure vital signs reviewed and stable Respiratory status: spontaneous breathing Cardiovascular status: blood pressure returned to baseline Anesthetic complications: no     Last Vitals:  Vitals:   12/17/17 1415 12/17/17 1428  BP: 99/75 99/75  Pulse: 97 97  Resp: 17 17  Temp: 36.7 C 36.7 C  SpO2: 100%     Last Pain:  Vitals:   12/17/17 1428  TempSrc: Oral  PainSc:                  [name]

## 2017-12-18 MED ORDER — MIRTAZAPINE 30 MG PO TABS: 30.0000 mg | ORAL_TABLET | Freq: Every day | ORAL | 0 refills | Status: DC

## 2017-12-18 MED ORDER — TRAZODONE HCL 150 MG PO TABS: 150.0000 mg | ORAL_TABLET | Freq: Every day | ORAL | 1 refills | Status: DC

## 2017-12-18 MED ORDER — DOCUSATE SODIUM 100 MG PO CAPS: 100.0000 mg | ORAL_CAPSULE | Freq: Two times a day (BID) | ORAL | 1 refills | Status: DC

## 2017-12-18 MED ORDER — CARVEDILOL 6.25 MG PO TABS: 6.2500 mg | ORAL_TABLET | Freq: Two times a day (BID) | ORAL | 1 refills | Status: DC

## 2017-12-18 MED ORDER — DOCUSATE SODIUM 100 MG PO CAPS: 100.0000 mg | ORAL_CAPSULE | Freq: Two times a day (BID) | ORAL | 0 refills | Status: DC

## 2017-12-18 MED ORDER — TRAZODONE HCL 150 MG PO TABS: 150.0000 mg | ORAL_TABLET | Freq: Every day | ORAL | 0 refills | Status: DC

## 2017-12-18 MED ORDER — ATORVASTATIN CALCIUM 10 MG PO TABS: 10.0000 mg | ORAL_TABLET | Freq: Every day | ORAL | 1 refills | Status: DC

## 2017-12-18 MED ORDER — FERROUS SULFATE 325 (65 FE) MG PO TABS: 325.0000 mg | ORAL_TABLET | Freq: Every day | ORAL | 0 refills | Status: DC

## 2017-12-18 MED ORDER — LORATADINE 10 MG PO TABS: 10.0000 mg | ORAL_TABLET | Freq: Every day | ORAL | 1 refills | Status: DC

## 2017-12-18 MED ORDER — TRIAMTERENE-HCTZ 37.5-25 MG PO TABS: 2.0000 | ORAL_TABLET | Freq: Every day | ORAL | 1 refills | Status: DC

## 2017-12-18 MED ORDER — LORATADINE 10 MG PO TABS: 10.0000 mg | ORAL_TABLET | Freq: Every day | ORAL | 0 refills | Status: DC

## 2017-12-18 MED ORDER — FERROUS SULFATE 325 (65 FE) MG PO TABS: 325.0000 mg | ORAL_TABLET | Freq: Every day | ORAL | 1 refills | Status: DC

## 2017-12-18 MED ORDER — SERTRALINE HCL 100 MG PO TABS: 200.0000 mg | ORAL_TABLET | Freq: Every day | ORAL | 7 refills | Status: DC

## 2017-12-18 MED ORDER — MIRTAZAPINE 30 MG PO TABS: 30.0000 mg | ORAL_TABLET | Freq: Every day | ORAL | 1 refills | Status: DC

## 2017-12-18 MED ORDER — ARIPIPRAZOLE 10 MG PO TABS: 10.0000 mg | ORAL_TABLET | Freq: Every day | ORAL | 0 refills | Status: DC

## 2017-12-18 MED ORDER — ATORVASTATIN CALCIUM 10 MG PO TABS: 10.0000 mg | ORAL_TABLET | Freq: Every day | ORAL | 0 refills | Status: DC

## 2017-12-18 MED ORDER — ARIPIPRAZOLE 10 MG PO TABS: 10.0000 mg | ORAL_TABLET | Freq: Every day | ORAL | 1 refills | Status: DC

## 2017-12-18 MED ORDER — SERTRALINE HCL 100 MG PO TABS: 200.0000 mg | ORAL_TABLET | Freq: Every day | ORAL | 1 refills | Status: DC

## 2017-12-18 MED ORDER — TRIAMTERENE-HCTZ 37.5-25 MG PO TABS: 2.0000 | ORAL_TABLET | Freq: Every day | ORAL | 0 refills | Status: DC

## 2017-12-18 NOTE — Progress Notes (Signed)
Recreation Therapy Notes  Date: 02.05.2019  Time: 1:00 PM  Location: Craft Room  Behavioral response: Appropriate   Intervention Topic: Stress  Discussion/Intervention: Group content on today was focused on stress. The group defined stress and way to cope with stress. Participants expressed how they know when they are stresses out. Individuals described the different ways they have to cope with stress. The group stated reasons why it is important to cope with stress. Patient explained what good stress is and some examples. The group participated in the intervention "Stress Management". Individuals were separated into two group and answered questions related to stress.  Clinical Observations/Feedback: Patient came to group and stated sometimes her daughters are stressors for her. She expressed sometime getting angry and frustrated when she is stressed. Individual participated in the intervention and was focused on what her peers and staff had to say about the topic at hand.  Marypat Kimmet LRT/CTRS         Calub Tarnow 12/18/2017 2:40 PM

## 2017-12-18 NOTE — Progress Notes (Signed)
Recreation Therapy Notes  INPATIENT RECREATION TR PLAN  Patient Details Name: [name] MRN: [id number] DOB: 1966-02-24 Today's Date: 12/18/2017  Rec Therapy Plan Is patient appropriate for Therapeutic Recreation?: Yes Treatment times per week: at least 3 Estimated Length of Stay: 5-7 days TR Treatment/Interventions: Group participation (Comment)  Discharge Criteria Pt will be discharged from therapy if:: Discharged Treatment plan/goals/alternatives discussed and agreed upon by:: Patient/family  Discharge Summary Short term goals set: Patient will identify 3 triggers for depressive symptoms x5 days.  Short term goals met: Complete Progress toward goals comments: Groups attended Which groups?: Anger management, Stress management, Goal setting, Leisure education, Communication, Coping skills(Problem Solving, Time Management, Team work , Values, Self-care) Reason goals not met: N/A Therapeutic equipment acquired: N/A Reason patient discharged from therapy: Discharge from hospital Pt/family agrees with progress & goals achieved: Yes Date patient discharged from therapy: 12/18/17   Eugen Jeansonne 12/18/2017, 2:49 PM

## 2017-12-18 NOTE — Progress Notes (Signed)
  [facility] Adult Case Management Discharge Plan :  Will you be returning to the same living situation after discharge:  Yes,  Retruning home with son Gerlene Burdock At discharge, do you have transportation home?: Yes,  Son Gerlene Burdock will come pick her up Do you have the ability to pay for your medications: Yes,  Referred to a provider who can assist.  Release of information consent forms completed and in the chart;  Patient's signature needed at discharge.  Patient to Follow up at: Follow-up Information    [facility] Go on 12/25/2017.   Specialty:  Behavioral Health Why:  Please arrive to your scheduled appointment with [facility] on Tuesday Feburary 12th, 2019 at 9am. Please arrive with your identification. Thank you!! Contact information: [address] [place] [address] [phone]           Next level of care provider has access to [facility] Link:no  Safety Planning and Suicide Prevention discussed: Yes,  Completed with Son Richard\  Have you used any form of tobacco in the last 30 days? (Cigarettes, Smokeless Tobacco, Cigars, and/or Pipes): No  Has patient been referred to the Quitline?: N/A patient is not a smoker  Patient has been referred for addiction treatment: N/A  [name], LCSW 12/18/2017, 11:34 AM

## 2017-12-18 NOTE — Progress Notes (Signed)
Patient ID: Julia Wheeler Julia Wheeler, female   DOB: December 07, 1965, 52 y.o.   MRN: [id number] Observed in the Day Room watching TV with others, pleasant, friendly on approach, no depressive symptoms, denied pain, "everything is going well, I feel better with the ECT. I am fine..." Denied SI/SIB/AVH; denied constipation, no untoward effects from ECT; Medication compliant.

## 2017-12-18 NOTE — Progress Notes (Signed)
Patient is A & O x 4. Denies any acute pain at this time. Accepted her medications with no known side effect. Denies SI/HI this shift. Patient is pleasant. Will continue   camera surveillance and 15 minutes rounding.

## 2017-12-18 NOTE — BHH Counselor (Signed)
CSW spoke with patents son Gerlene Burdock about today's discharge. He reports that he can pick her up today at [facility]. CSW informed patient, nurse and doctor of pick up time.   [name], MSW, [place], LCASA 12/18/2017 11:32 AM

## 2017-12-18 NOTE — Plan of Care (Signed)
Patient slept for Estimated Hours of 6.45; Precautionary checks every 15 minutes for safety maintained, room free of safety hazards, patient sustains no injury or falls during this shift.  

## 2017-12-18 NOTE — BHH Group Notes (Signed)
12/18/2017 9:30AM  Type of Therapy/Topic:  Group Therapy:  Feelings about Diagnosis  Participation Level:  Active   Description of Group:   This group will allow patients to explore their thoughts and feelings about diagnoses they have received. Patients will be guided to explore their level of understanding and acceptance of these diagnoses. Facilitator will encourage patients to process their thoughts and feelings about the reactions of others to their diagnosis and will guide patients in identifying ways to discuss their diagnosis with significant others in their lives. This group will be process-oriented, with patients participating in exploration of their own experiences, giving and receiving support, and processing challenge from other group members.   Therapeutic Goals: 1. Patient will demonstrate understanding of diagnosis as evidenced by identifying two or more symptoms of the disorder 2. Patient will be able to express two feelings regarding the diagnosis 3. Patient will demonstrate their ability to communicate their needs through discussion and/or role play  Summary of Patient Progress: Actively and appropriately engaged in the group. Patient was able to provide support and validation to other group members.Patient practiced active listening when interacting with the facilitator and other group members Patient in still in the process of obtaining treatment goals. Julia Wheeler says that "my symptoms are depression. I get depressed and I don't know what I can do to address this when I get discharged."      Therapeutic Modalities:   Cognitive Behavioral Therapy Brief Therapy Feelings Identification    [name], LCSW 12/18/2017 10:19 AM
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
## 2017-12-18 NOTE — Progress Notes (Signed)
Patient escorted to the hallway to meet her son. Went over her diischarge instructions and patient verbalized understanding and her questions were answered by this RN. Son requested to go over again with the discharge instructions. RN reviewed D/C instructions with son and date for her next appointment with [facility]. No c/o pain or any acute distress noted.

## 2017-12-27 ENCOUNTER — Emergency Department (HOSPITAL_COMMUNITY)
Admission: EM | Admit: 2017-12-27 | Discharge: 2017-12-28 | Disposition: A | Discharge status: Home / Self Care | Payer: Self-pay | Attending: Emergency Medicine | Admitting: Emergency Medicine

## 2017-12-27 ENCOUNTER — Encounter (HOSPITAL_COMMUNITY): Payer: Self-pay

## 2017-12-27 DIAGNOSIS — Z79899 Other long term (current) drug therapy: Secondary | ICD-10-CM | POA: Insufficient documentation

## 2017-12-27 DIAGNOSIS — K8020 Calculus of gallbladder without cholecystitis without obstruction: Secondary | ICD-10-CM | POA: Insufficient documentation

## 2017-12-27 DIAGNOSIS — I5022 Chronic systolic (congestive) heart failure: Secondary | ICD-10-CM | POA: Insufficient documentation

## 2017-12-27 DIAGNOSIS — I110 Hypertensive heart disease with heart failure: Secondary | ICD-10-CM | POA: Insufficient documentation

## 2017-12-27 DIAGNOSIS — Z9884 Bariatric surgery status: Secondary | ICD-10-CM | POA: Insufficient documentation

## 2017-12-27 LAB — CBC
HEMATOCRIT: 29.7 % — AB (ref 36.0–46.0)
Hemoglobin: 9.3 g/dL — ABNORMAL LOW (ref 12.0–15.0)
MCH: 26.1 pg (ref 26.0–34.0)
MCHC: 31.3 g/dL (ref 30.0–36.0)
MCV: 83.4 fL (ref 78.0–100.0)
PLATELETS: 196 10*3/uL (ref 150–400)
RBC: 3.56 MIL/uL — ABNORMAL LOW (ref 3.87–5.11)
RDW: 17.5 % — AB (ref 11.5–15.5)
WBC: 4.8 10*3/uL (ref 4.0–10.5)

## 2017-12-27 LAB — COMPREHENSIVE METABOLIC PANEL
ALT: 12 U/L — ABNORMAL LOW (ref 14–54)
ANION GAP: 11 (ref 5–15)
AST: 19 U/L (ref 15–41)
Albumin: 3.1 g/dL — ABNORMAL LOW (ref 3.5–5.0)
Alkaline Phosphatase: 74 U/L (ref 38–126)
BILIRUBIN TOTAL: 0.5 mg/dL (ref 0.3–1.2)
BUN: 9 mg/dL (ref 6–20)
CO2: 23 mmol/L (ref 22–32)
Calcium: 8.7 mg/dL — ABNORMAL LOW (ref 8.9–10.3)
Chloride: 105 mmol/L (ref 101–111)
Creatinine, Ser: 0.81 mg/dL (ref 0.44–1.00)
GFR calc Af Amer: >60 mL/min (ref >60)
Glucose, Bld: 96 mg/dL (ref 65–99)
POTASSIUM: 3.3 mmol/L — AB (ref 3.5–5.1)
Sodium: 139 mmol/L (ref 135–145)
Total Protein: 6.1 g/dL — ABNORMAL LOW (ref 6.5–8.1)

## 2017-12-27 LAB — I-STAT BETA HCG BLOOD, ED (MC, WL, AP ONLY)

## 2017-12-27 LAB — URINALYSIS, ROUTINE W REFLEX MICROSCOPIC
Bilirubin Urine: NEGATIVE
Glucose, UA: NEGATIVE mg/dL
Hgb urine dipstick: NEGATIVE
KETONES UR: NEGATIVE mg/dL
LEUKOCYTES UA: NEGATIVE
NITRITE: NEGATIVE
PH: 5 (ref 5.0–8.0)
PROTEIN: NEGATIVE mg/dL
Specific Gravity, Urine: 1.015 (ref 1.005–1.030)

## 2017-12-27 LAB — LIPASE, BLOOD: Lipase: 33 U/L (ref 11–51)

## 2017-12-27 MED ORDER — ONDANSETRON HCL 4 MG/2ML IJ SOLN
4.0000 mg | INTRAMUSCULAR | Status: AC
  Administered 2017-12-28: 4 mg via INTRAVENOUS
  Filled 2017-12-27: qty 2

## 2017-12-27 MED ORDER — FENTANYL CITRATE (PF) 100 MCG/2ML IJ SOLN
50.0000 ug | Freq: Once | INTRAMUSCULAR | Status: AC
  Administered 2017-12-28: 50 ug via INTRAVENOUS
  Filled 2017-12-27: qty 2

## 2017-12-27 NOTE — ED Triage Notes (Signed)
Pt reports LUQ abdominal pain that radiates across the top of abdomen x 4 days. Denies n/v, diarrhea, constipation. Pt reports having significant GI hx including intussusception.
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
## 2017-12-28 ENCOUNTER — Emergency Department (HOSPITAL_COMMUNITY): Payer: Self-pay

## 2017-12-28 MED ORDER — SODIUM CHLORIDE 0.9 % IV BOLUS (SEPSIS)
1000.0000 mL | Freq: Once | INTRAVENOUS | Status: AC
  Administered 2017-12-28: 1000 mL via INTRAVENOUS

## 2017-12-28 MED ORDER — FENTANYL CITRATE (PF) 100 MCG/2ML IJ SOLN
50.0000 ug | Freq: Once | INTRAMUSCULAR | Status: AC
  Administered 2017-12-28: 50 ug via INTRAVENOUS
  Filled 2017-12-28: qty 2

## 2017-12-28 MED ORDER — IOPAMIDOL (ISOVUE-300) INJECTION 61%
INTRAVENOUS | Status: AC
  Administered 2017-12-28: 100 mL
  Filled 2017-12-28: qty 100

## 2017-12-28 MED ORDER — OXYCODONE HCL 5 MG PO TABS: 2.5000 mg | ORAL_TABLET | ORAL | 0 refills | Status: DC | PRN

## 2017-12-28 MED ORDER — ONDANSETRON HCL 4 MG PO TABS: 4.0000 mg | ORAL_TABLET | Freq: Three times a day (TID) | ORAL | 0 refills | Status: DC | PRN

## 2017-12-28 NOTE — Discharge Instructions (Signed)
Contact a health care provider if: °You think you have had a gallbladder attack. °You have been diagnosed with silent gallstones and you develop abdominal pain or indigestion. °Get help right away if: °You have pain from a gallbladder attack that lasts for more than 2 hours. °You have abdominal pain that lasts for more than 5 hours. °You have a fever or chills. °You have persistent nausea and vomiting. °You develop jaundice. °You have dark urine or light-colored stools. °
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
## 2018-04-01 ENCOUNTER — Emergency Department (HOSPITAL_COMMUNITY): Payer: Self-pay

## 2018-04-01 ENCOUNTER — Emergency Department (HOSPITAL_COMMUNITY)
Admission: EM | Admit: 2018-04-01 | Discharge: 2018-04-01 | Disposition: A | Discharge status: Home / Self Care | Payer: Self-pay | Attending: Emergency Medicine | Admitting: Emergency Medicine

## 2018-04-01 ENCOUNTER — Encounter (HOSPITAL_COMMUNITY): Payer: Self-pay

## 2018-04-01 DIAGNOSIS — Z9884 Bariatric surgery status: Secondary | ICD-10-CM | POA: Insufficient documentation

## 2018-04-01 DIAGNOSIS — R1110 Vomiting, unspecified: Secondary | ICD-10-CM

## 2018-04-01 DIAGNOSIS — R002 Palpitations: Secondary | ICD-10-CM

## 2018-04-01 DIAGNOSIS — R079 Chest pain, unspecified: Secondary | ICD-10-CM

## 2018-04-01 DIAGNOSIS — R1012 Left upper quadrant pain: Secondary | ICD-10-CM

## 2018-04-01 DIAGNOSIS — I5022 Chronic systolic (congestive) heart failure: Secondary | ICD-10-CM | POA: Insufficient documentation

## 2018-04-01 DIAGNOSIS — R112 Nausea with vomiting, unspecified: Secondary | ICD-10-CM | POA: Insufficient documentation

## 2018-04-01 DIAGNOSIS — Z79899 Other long term (current) drug therapy: Secondary | ICD-10-CM | POA: Insufficient documentation

## 2018-04-01 DIAGNOSIS — I110 Hypertensive heart disease with heart failure: Secondary | ICD-10-CM | POA: Insufficient documentation

## 2018-04-01 LAB — I-STAT TROPONIN, ED
Troponin i, poc: 0.04 ng/mL (ref 0.00–0.08)
Troponin i, poc: 0 ng/mL (ref 0.00–0.08)

## 2018-04-01 LAB — COMPREHENSIVE METABOLIC PANEL
ALT: 13 U/L — AB (ref 14–54)
AST: 21 U/L (ref 15–41)
Albumin: 3.8 g/dL (ref 3.5–5.0)
Alkaline Phosphatase: 161 U/L — ABNORMAL HIGH (ref 38–126)
Anion gap: 11 (ref 5–15)
BILIRUBIN TOTAL: 0.6 mg/dL (ref 0.3–1.2)
BUN: 10 mg/dL (ref 6–20)
CALCIUM: 9.5 mg/dL (ref 8.9–10.3)
CO2: 26 mmol/L (ref 22–32)
Chloride: 101 mmol/L (ref 101–111)
Creatinine, Ser: 0.83 mg/dL (ref 0.44–1.00)
GFR calc Af Amer: >60 mL/min (ref >60)
Glucose, Bld: 85 mg/dL (ref 65–99)
Potassium: 4.2 mmol/L (ref 3.5–5.1)
Sodium: 138 mmol/L (ref 135–145)
TOTAL PROTEIN: 7.5 g/dL (ref 6.5–8.1)

## 2018-04-01 LAB — I-STAT BETA HCG BLOOD, ED (MC, WL, AP ONLY): I-stat hCG, quantitative: <5 m[IU]/mL (ref <5)

## 2018-04-01 LAB — CBC WITH DIFFERENTIAL/PLATELET
ABS IMMATURE GRANULOCYTES: 0 10*3/uL (ref 0.0–0.1)
Basophils Absolute: 0 10*3/uL (ref 0.0–0.1)
Basophils Relative: 1 %
EOS ABS: 0 10*3/uL (ref 0.0–0.7)
Eosinophils Relative: 0 %
HEMATOCRIT: 30.2 % — AB (ref 36.0–46.0)
Hemoglobin: 9.2 g/dL — ABNORMAL LOW (ref 12.0–15.0)
IMMATURE GRANULOCYTES: 0 %
LYMPHS ABS: 1.6 10*3/uL (ref 0.7–4.0)
Lymphocytes Relative: 19 %
MCH: 23.9 pg — ABNORMAL LOW (ref 26.0–34.0)
MCHC: 30.5 g/dL (ref 30.0–36.0)
MCV: 78.4 fL (ref 78.0–100.0)
MONO ABS: 0.7 10*3/uL (ref 0.1–1.0)
MONOS PCT: 9 %
NEUTROS PCT: 71 %
Neutro Abs: 5.8 10*3/uL (ref 1.7–7.7)
Platelets: 335 10*3/uL (ref 150–400)
RBC: 3.85 MIL/uL — ABNORMAL LOW (ref 3.87–5.11)
RDW: 16.1 % — AB (ref 11.5–15.5)
WBC: 8.1 10*3/uL (ref 4.0–10.5)

## 2018-04-01 LAB — LIPASE, BLOOD: LIPASE: 32 U/L (ref 11–51)

## 2018-04-01 LAB — BRAIN NATRIURETIC PEPTIDE: B Natriuretic Peptide: 26.5 pg/mL (ref 0.0–100.0)

## 2018-04-01 MED ORDER — IOHEXOL 300 MG/ML  SOLN
100.0000 mL | Freq: Once | INTRAMUSCULAR | Status: AC | PRN
  Administered 2018-04-01: 100 mL via INTRAVENOUS

## 2018-04-01 MED ORDER — ONDANSETRON 4 MG PO TBDP
4.0000 mg | ORAL_TABLET | Freq: Once | ORAL | Status: AC
  Administered 2018-04-01: 4 mg via ORAL
  Filled 2018-04-01: qty 1

## 2018-04-01 MED ORDER — RANITIDINE HCL 150 MG PO TABS: 150.0000 mg | ORAL_TABLET | Freq: Two times a day (BID) | ORAL | 0 refills | Status: DC

## 2018-04-01 MED ORDER — ACETAMINOPHEN 325 MG PO TABS
650.0000 mg | ORAL_TABLET | Freq: Once | ORAL | Status: AC
  Administered 2018-04-01: 650 mg via ORAL
  Filled 2018-04-01: qty 2

## 2018-04-01 NOTE — ED Provider Notes (Signed)
Patient placed in Quick Look pathway, seen and evaluated   Chief Complaint: abdominal pain and chest pain, nausea, vomiting   HPI:   Pt reports chest heaviness and palpitations for several weeks. States has cough, orthopnea, reports hx of CHF. Pt also reports left upper quadrant abd pain. States pain is constant, intensity comes and goes. Does not radiate to the back. Reports persistent nausea and vomiting. States eating and drinking makes pain worse. States had gastric bypass and intussusception in the past. Pt having bowel movements, states bowels are grey/green, not normal for her. States did not try any medications prior to coming in.   ROS: chest pain, abdominal pain, nausea, vomiting, exertional sob  Physical Exam:   Gen: No distress  Neuro: Awake and Alert  Skin: Warm    Focused Exam: TTP in left upper quadrant and epigastric area. Regular HR and rhythm. Lungs clear. Pt has dry crusted rash to the left flank and left lower abdomen. Non tender.   Initiation of care has begun. The patient has been counseled on the process, plan, and necessity for staying for the completion/evaluation, and the remainder of the medical screening examination   Pt with hx of gastric bypass and introsusception in the past, here with abd pain, nausea, vomiting. She is also complaining of exertional cp and sob. Will check labs, cxr, abd ct to ro obstruction. Prolonged QT on ECG, will hold off on giving any enti emetics until on monitor in the room. Pt will be placed back in waiting room. She is not actively vomiting.    Vitals:   04/01/18 1355  BP: (!) 164/111  Pulse: (!) 105  Resp: 16  Temp: 99 F (37.2 C)  TempSrc: Oral  SpO2: 100%      Jaynie Crumble, PA-C 04/01/18 1413    Margarita Grizzle, MD 04/03/18 1253

## 2018-04-01 NOTE — ED Notes (Signed)
Patient up to desk multiple times in tears, stating she is in terrible pain and needs a room.  This RN made her aware of her rooming.  Attempting to find a clean and available room for patient

## 2018-04-01 NOTE — ED Notes (Signed)
Pt. Stated, Julia Wheeler not had my BP meds in over a week.  Pt. Requesting something for pain and nausea.

## 2018-04-01 NOTE — ED Triage Notes (Signed)
Pt reports she is having left upper quadrant pain x several days with n/v. Pt states she was dx with gallstones in February. She also endorses chest heaviness, orthopnea, and cough. Endorses chf hx.
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
## 2018-04-01 NOTE — ED Notes (Signed)
Pt. To CT via stretcher. 

## 2018-07-22 ENCOUNTER — Observation Stay (HOSPITAL_COMMUNITY): Payer: Self-pay

## 2018-07-22 ENCOUNTER — Emergency Department (HOSPITAL_COMMUNITY): Payer: Self-pay

## 2018-07-22 ENCOUNTER — Encounter (HOSPITAL_COMMUNITY): Payer: Self-pay | Admitting: Emergency Medicine

## 2018-07-22 ENCOUNTER — Other Ambulatory Visit: Payer: Self-pay

## 2018-07-22 ENCOUNTER — Inpatient Hospital Stay (HOSPITAL_COMMUNITY)
Admission: EM | Admit: 2018-07-22 | Discharge: 2018-07-27 | DRG: 641 | Disposition: A | Discharge status: Home / Self Care | Payer: Self-pay | Attending: Internal Medicine | Admitting: Internal Medicine

## 2018-07-22 DIAGNOSIS — R197 Diarrhea, unspecified: Secondary | ICD-10-CM | POA: Diagnosis present

## 2018-07-22 DIAGNOSIS — E785 Hyperlipidemia, unspecified: Secondary | ICD-10-CM | POA: Diagnosis present

## 2018-07-22 DIAGNOSIS — I10 Essential (primary) hypertension: Secondary | ICD-10-CM | POA: Diagnosis present

## 2018-07-22 DIAGNOSIS — F419 Anxiety disorder, unspecified: Secondary | ICD-10-CM | POA: Diagnosis present

## 2018-07-22 DIAGNOSIS — Z818 Family history of other mental and behavioral disorders: Secondary | ICD-10-CM

## 2018-07-22 DIAGNOSIS — E860 Dehydration: Principal | ICD-10-CM | POA: Diagnosis present

## 2018-07-22 DIAGNOSIS — R55 Syncope and collapse: Secondary | ICD-10-CM | POA: Diagnosis present

## 2018-07-22 DIAGNOSIS — R402140 Coma scale, eyes open, spontaneous, unspecified time: Secondary | ICD-10-CM | POA: Diagnosis present

## 2018-07-22 DIAGNOSIS — E869 Volume depletion, unspecified: Secondary | ICD-10-CM | POA: Diagnosis present

## 2018-07-22 DIAGNOSIS — R402360 Coma scale, best motor response, obeys commands, unspecified time: Secondary | ICD-10-CM | POA: Diagnosis present

## 2018-07-22 DIAGNOSIS — I4581 Long QT syndrome: Secondary | ICD-10-CM | POA: Diagnosis present

## 2018-07-22 DIAGNOSIS — G4700 Insomnia, unspecified: Secondary | ICD-10-CM | POA: Diagnosis present

## 2018-07-22 DIAGNOSIS — N179 Acute kidney failure, unspecified: Secondary | ICD-10-CM | POA: Diagnosis present

## 2018-07-22 DIAGNOSIS — Z888 Allergy status to other drugs, medicaments and biological substances status: Secondary | ICD-10-CM

## 2018-07-22 DIAGNOSIS — Z634 Disappearance and death of family member: Secondary | ICD-10-CM

## 2018-07-22 DIAGNOSIS — R402250 Coma scale, best verbal response, oriented, unspecified time: Secondary | ICD-10-CM | POA: Diagnosis present

## 2018-07-22 DIAGNOSIS — F410 Panic disorder [episodic paroxysmal anxiety] without agoraphobia: Secondary | ICD-10-CM | POA: Diagnosis present

## 2018-07-22 DIAGNOSIS — R1032 Left lower quadrant pain: Secondary | ICD-10-CM | POA: Diagnosis present

## 2018-07-22 DIAGNOSIS — Z9884 Bariatric surgery status: Secondary | ICD-10-CM

## 2018-07-22 DIAGNOSIS — R002 Palpitations: Secondary | ICD-10-CM | POA: Diagnosis not present

## 2018-07-22 DIAGNOSIS — F333 Major depressive disorder, recurrent, severe with psychotic symptoms: Secondary | ICD-10-CM | POA: Diagnosis present

## 2018-07-22 DIAGNOSIS — Z8719 Personal history of other diseases of the digestive system: Secondary | ICD-10-CM

## 2018-07-22 DIAGNOSIS — I951 Orthostatic hypotension: Secondary | ICD-10-CM | POA: Diagnosis present

## 2018-07-22 DIAGNOSIS — E876 Hypokalemia: Secondary | ICD-10-CM | POA: Diagnosis present

## 2018-07-22 DIAGNOSIS — I5022 Chronic systolic (congestive) heart failure: Secondary | ICD-10-CM | POA: Diagnosis present

## 2018-07-22 DIAGNOSIS — W19XXXA Unspecified fall, initial encounter: Secondary | ICD-10-CM | POA: Diagnosis present

## 2018-07-22 DIAGNOSIS — F4310 Post-traumatic stress disorder, unspecified: Secondary | ICD-10-CM | POA: Diagnosis present

## 2018-07-22 DIAGNOSIS — I959 Hypotension, unspecified: Secondary | ICD-10-CM

## 2018-07-22 DIAGNOSIS — Z885 Allergy status to narcotic agent status: Secondary | ICD-10-CM

## 2018-07-22 DIAGNOSIS — D573 Sickle-cell trait: Secondary | ICD-10-CM | POA: Diagnosis present

## 2018-07-22 DIAGNOSIS — I110 Hypertensive heart disease with heart failure: Secondary | ICD-10-CM | POA: Diagnosis present

## 2018-07-22 DIAGNOSIS — R112 Nausea with vomiting, unspecified: Secondary | ICD-10-CM | POA: Diagnosis present

## 2018-07-22 DIAGNOSIS — Z8711 Personal history of peptic ulcer disease: Secondary | ICD-10-CM

## 2018-07-22 DIAGNOSIS — K219 Gastro-esophageal reflux disease without esophagitis: Secondary | ICD-10-CM | POA: Diagnosis present

## 2018-07-22 DIAGNOSIS — Z79899 Other long term (current) drug therapy: Secondary | ICD-10-CM

## 2018-07-22 DIAGNOSIS — I428 Other cardiomyopathies: Secondary | ICD-10-CM | POA: Diagnosis present

## 2018-07-22 DIAGNOSIS — T454X6A Underdosing of iron and its compounds, initial encounter: Secondary | ICD-10-CM | POA: Diagnosis present

## 2018-07-22 DIAGNOSIS — Z915 Personal history of self-harm: Secondary | ICD-10-CM

## 2018-07-22 DIAGNOSIS — D509 Iron deficiency anemia, unspecified: Secondary | ICD-10-CM | POA: Diagnosis present

## 2018-07-22 DIAGNOSIS — R531 Weakness: Secondary | ICD-10-CM | POA: Diagnosis present

## 2018-07-22 DIAGNOSIS — F331 Major depressive disorder, recurrent, moderate: Secondary | ICD-10-CM

## 2018-07-22 DIAGNOSIS — Z91030 Bee allergy status: Secondary | ICD-10-CM

## 2018-07-22 LAB — BASIC METABOLIC PANEL
Anion gap: 12 (ref 5–15)
BUN: 20 mg/dL (ref 6–20)
CALCIUM: 9.7 mg/dL (ref 8.9–10.3)
CHLORIDE: 99 mmol/L (ref 98–111)
CO2: 26 mmol/L (ref 22–32)
CREATININE: 1.27 mg/dL — AB (ref 0.44–1.00)
GFR, EST AFRICAN AMERICAN: 56 mL/min — AB (ref >60)
GFR, EST NON AFRICAN AMERICAN: 48 mL/min — AB (ref >60)
Glucose, Bld: 150 mg/dL — ABNORMAL HIGH (ref 70–99)
Potassium: 3.4 mmol/L — ABNORMAL LOW (ref 3.5–5.1)
SODIUM: 137 mmol/L (ref 135–145)

## 2018-07-22 LAB — URINALYSIS, ROUTINE W REFLEX MICROSCOPIC
BILIRUBIN URINE: NEGATIVE
GLUCOSE, UA: NEGATIVE mg/dL
HGB URINE DIPSTICK: NEGATIVE
KETONES UR: 5 mg/dL — AB
LEUKOCYTES UA: NEGATIVE
Nitrite: NEGATIVE
Protein, ur: NEGATIVE mg/dL
Specific Gravity, Urine: 1.012 (ref 1.005–1.030)
pH: 6 (ref 5.0–8.0)

## 2018-07-22 LAB — CBC
HCT: 29.9 % — ABNORMAL LOW (ref 36.0–46.0)
Hemoglobin: 8.7 g/dL — ABNORMAL LOW (ref 12.0–15.0)
MCH: 21.2 pg — ABNORMAL LOW (ref 26.0–34.0)
MCHC: 29.1 g/dL — ABNORMAL LOW (ref 30.0–36.0)
MCV: 72.9 fL — AB (ref 78.0–100.0)
PLATELETS: 306 10*3/uL (ref 150–400)
RBC: 4.1 MIL/uL (ref 3.87–5.11)
RDW: 21.5 % — ABNORMAL HIGH (ref 11.5–15.5)
WBC: 4 10*3/uL (ref 4.0–10.5)

## 2018-07-22 LAB — CBG MONITORING, ED: Glucose-Capillary: 128 mg/dL — ABNORMAL HIGH (ref 70–99)

## 2018-07-22 LAB — MAGNESIUM: Magnesium: 2.2 mg/dL (ref 1.7–2.4)

## 2018-07-22 LAB — HEMATOCRIT: HCT: 26 % — ABNORMAL LOW (ref 36.0–46.0)

## 2018-07-22 LAB — I-STAT TROPONIN, ED
TROPONIN I, POC: 0.02 ng/mL (ref 0.00–0.08)
Troponin i, poc: 0.02 ng/mL (ref 0.00–0.08)
Troponin i, poc: 0 ng/mL (ref 0.00–0.08)

## 2018-07-22 LAB — D-DIMER, QUANTITATIVE (NOT AT ARMC): D DIMER QUANT: 0.83 ug{FEU}/mL — AB (ref 0.00–0.50)

## 2018-07-22 LAB — HEMOGLOBIN: Hemoglobin: 7.6 g/dL — ABNORMAL LOW (ref 12.0–15.0)

## 2018-07-22 LAB — T4, FREE: Free T4: 0.97 ng/dL (ref 0.82–1.77)

## 2018-07-22 LAB — PHOSPHORUS: Phosphorus: 3.7 mg/dL (ref 2.5–4.6)

## 2018-07-22 LAB — I-STAT BETA HCG BLOOD, ED (MC, WL, AP ONLY): I-stat hCG, quantitative: <5 m[IU]/mL (ref <5)

## 2018-07-22 LAB — TROPONIN I: Troponin I: <0.03 ng/mL (ref <0.03)

## 2018-07-22 LAB — TSH: TSH: 0.4 u[IU]/mL (ref 0.350–4.500)

## 2018-07-22 MED ORDER — POTASSIUM CHLORIDE IN NACL 20-0.9 MEQ/L-% IV SOLN
INTRAVENOUS | Status: DC
  Administered 2018-07-22 – 2018-07-24 (×3): via INTRAVENOUS
  Filled 2018-07-22 (×3): qty 1000

## 2018-07-22 MED ORDER — ATORVASTATIN CALCIUM 10 MG PO TABS
10.0000 mg | ORAL_TABLET | Freq: Every day | ORAL | Status: DC
  Administered 2018-07-22 – 2018-07-26 (×5): 10 mg via ORAL
  Filled 2018-07-22 (×5): qty 1

## 2018-07-22 MED ORDER — ENSURE ENLIVE PO LIQD: 237.0000 mL | Freq: Two times a day (BID) | ORAL | Status: DC

## 2018-07-22 MED ORDER — TECHNETIUM TO 99M ALBUMIN AGGREGATED
4.2700 | Freq: Once | INTRAVENOUS | Status: AC | PRN
  Administered 2018-07-22: 4.27 via INTRAVENOUS

## 2018-07-22 MED ORDER — ARIPIPRAZOLE 10 MG PO TABS
10.0000 mg | ORAL_TABLET | Freq: Every day | ORAL | Status: DC
  Administered 2018-07-22 – 2018-07-27 (×5): 10 mg via ORAL
  Filled 2018-07-22: qty 1
  Filled 2018-07-22: qty 2
  Filled 2018-07-22: qty 1
  Filled 2018-07-22 (×5): qty 2
  Filled 2018-07-22 (×4): qty 1

## 2018-07-22 MED ORDER — ENOXAPARIN SODIUM 40 MG/0.4ML ~~LOC~~ SOLN
40.0000 mg | SUBCUTANEOUS | Status: DC
  Administered 2018-07-22 – 2018-07-26 (×5): 40 mg via SUBCUTANEOUS
  Filled 2018-07-22 (×5): qty 0.4

## 2018-07-22 MED ORDER — SODIUM CHLORIDE 0.9 % IV BOLUS
500.0000 mL | Freq: Once | INTRAVENOUS | Status: AC
Start: 2018-07-22 — End: 2018-07-22
  Administered 2018-07-22: 500 mL via INTRAVENOUS

## 2018-07-22 MED ORDER — PANTOPRAZOLE SODIUM 40 MG PO TBEC
40.0000 mg | DELAYED_RELEASE_TABLET | ORAL | Status: DC | PRN
  Administered 2018-07-24: 40 mg via ORAL
  Filled 2018-07-22: qty 1

## 2018-07-22 MED ORDER — POTASSIUM CHLORIDE CRYS ER 20 MEQ PO TBCR
40.0000 meq | EXTENDED_RELEASE_TABLET | Freq: Once | ORAL | Status: AC
  Administered 2018-07-22: 40 meq via ORAL
  Filled 2018-07-22: qty 2

## 2018-07-22 MED ORDER — TECHNETIUM TC 99M DIETHYLENETRIAME-PENTAACETIC ACID
31.5000 | Freq: Once | INTRAVENOUS | Status: AC | PRN
  Administered 2018-07-22: 31.5 via RESPIRATORY_TRACT

## 2018-07-22 MED ORDER — LORATADINE 10 MG PO TABS: 10.0000 mg | ORAL_TABLET | Freq: Every day | ORAL | Status: DC | PRN

## 2018-07-22 MED ORDER — SERTRALINE HCL 100 MG PO TABS
200.0000 mg | ORAL_TABLET | Freq: Every day | ORAL | Status: DC
  Administered 2018-07-23 – 2018-07-27 (×4): 200 mg via ORAL
  Filled 2018-07-22: qty 4
  Filled 2018-07-22 (×4): qty 2

## 2018-07-22 MED ORDER — CARVEDILOL 6.25 MG PO TABS
6.2500 mg | ORAL_TABLET | Freq: Two times a day (BID) | ORAL | Status: DC
  Administered 2018-07-22 – 2018-07-27 (×9): 6.25 mg via ORAL
  Filled 2018-07-22 (×10): qty 1

## 2018-07-22 MED ORDER — ASPIRIN EC 325 MG PO TBEC
325.0000 mg | DELAYED_RELEASE_TABLET | Freq: Every day | ORAL | Status: DC
  Administered 2018-07-22 – 2018-07-27 (×5): 325 mg via ORAL
  Filled 2018-07-22 (×6): qty 1
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
## 2018-07-22 NOTE — ED Triage Notes (Signed)
Pt reports 2 syncopal episodes this am, states that she got up from her bed and began feeling dizzy and woke up on the floor. States she got up and walked to her closet then woke up slumped against the wall on the floor. Pt reports chest heaviness and palpitations. resp e/u, nad.

## 2018-07-22 NOTE — Progress Notes (Signed)
Admission RN states she will see pt. 

## 2018-07-22 NOTE — Progress Notes (Signed)
Pt states she does not feel like standing for orthostatic vital signs at this time.

## 2018-07-22 NOTE — ED Notes (Signed)
Patient transported to X-ray 
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
## 2018-07-23 ENCOUNTER — Observation Stay (HOSPITAL_BASED_OUTPATIENT_CLINIC_OR_DEPARTMENT_OTHER): Payer: Self-pay

## 2018-07-23 DIAGNOSIS — D649 Anemia, unspecified: Secondary | ICD-10-CM

## 2018-07-23 DIAGNOSIS — I428 Other cardiomyopathies: Secondary | ICD-10-CM

## 2018-07-23 DIAGNOSIS — I951 Orthostatic hypotension: Secondary | ICD-10-CM | POA: Diagnosis present

## 2018-07-23 LAB — COMPREHENSIVE METABOLIC PANEL
ALT: 12 U/L (ref 0–44)
AST: 19 U/L (ref 15–41)
Albumin: 3.5 g/dL (ref 3.5–5.0)
Alkaline Phosphatase: 79 U/L (ref 38–126)
Anion gap: 7 (ref 5–15)
BUN: 16 mg/dL (ref 6–20)
CO2: 27 mmol/L (ref 22–32)
Calcium: 9.4 mg/dL (ref 8.9–10.3)
Chloride: 102 mmol/L (ref 98–111)
Creatinine, Ser: 0.98 mg/dL (ref 0.44–1.00)
GFR calc Af Amer: >60 mL/min (ref >60)
GFR calc non Af Amer: >60 mL/min (ref >60)
Glucose, Bld: 111 mg/dL — ABNORMAL HIGH (ref 70–99)
Potassium: 4.3 mmol/L (ref 3.5–5.1)
Sodium: 136 mmol/L (ref 135–145)
Total Bilirubin: 0.6 mg/dL (ref 0.3–1.2)
Total Protein: 6.6 g/dL (ref 6.5–8.1)

## 2018-07-23 LAB — HIV ANTIBODY (ROUTINE TESTING W REFLEX): HIV Screen 4th Generation wRfx: NONREACTIVE

## 2018-07-23 LAB — PREPARE RBC (CROSSMATCH)

## 2018-07-23 LAB — ECHOCARDIOGRAM COMPLETE
Height: 67 in
Weight: 2476.8 oz

## 2018-07-23 LAB — HEMOGLOBIN AND HEMATOCRIT, BLOOD
HCT: 29.6 % — ABNORMAL LOW (ref 36.0–46.0)
Hemoglobin: 8.9 g/dL — ABNORMAL LOW (ref 12.0–15.0)

## 2018-07-23 MED ORDER — VITAMIN C 500 MG PO TABS
250.0000 mg | ORAL_TABLET | Freq: Three times a day (TID) | ORAL | Status: DC
  Administered 2018-07-23 – 2018-07-27 (×8): 250 mg via ORAL
  Filled 2018-07-23 (×11): qty 1

## 2018-07-23 MED ORDER — SODIUM CHLORIDE 0.9 % IV SOLN: INTRAVENOUS | Status: AC

## 2018-07-23 MED ORDER — SODIUM CHLORIDE 0.9% IV SOLUTION
Freq: Once | INTRAVENOUS | Status: AC
  Administered 2018-07-23: 13:00:00 via INTRAVENOUS

## 2018-07-23 MED ORDER — ENSURE ENLIVE PO LIQD
237.0000 mL | Freq: Two times a day (BID) | ORAL | Status: DC
  Administered 2018-07-27: 237 mL via ORAL

## 2018-07-23 MED ORDER — ADULT MULTIVITAMIN W/MINERALS CH
1.0000 | ORAL_TABLET | Freq: Every day | ORAL | Status: DC
  Administered 2018-07-23 – 2018-07-27 (×4): 1 via ORAL
  Filled 2018-07-23 (×5): qty 1

## 2018-07-23 MED ORDER — FERROUS SULFATE 325 (65 FE) MG PO TABS
325.0000 mg | ORAL_TABLET | Freq: Three times a day (TID) | ORAL | Status: DC
  Administered 2018-07-23 – 2018-07-27 (×8): 325 mg via ORAL
  Filled 2018-07-23 (×10): qty 1

## 2018-07-23 NOTE — Progress Notes (Signed)
Initial Nutrition Assessment  DOCUMENTATION CODES:   Not applicable  INTERVENTION:   -Ensure Enlive po BID, each supplement provides 350 kcal and 20 grams of protein -MVI with minerals daily  NUTRITION DIAGNOSIS:   Inadequate oral intake related to decreased appetite as evidenced by meal completion < 50%.  GOAL:   Patient will meet greater than or equal to 90% of their needs  MONITOR:   PO intake, Supplement acceptance, Labs, Weight trends, Skin, I & O's  REASON FOR ASSESSMENT:   Malnutrition Screening Tool    ASSESSMENT:   Patient is a 52 year old African-American female with past medical history significant for hypertension, hyperlipidemia, nephrolithiasis, grade 1 diastolic dysfunction, anemia, depression and posttraumatic stress disorder amongst other medical problems.  Patient presents with 2 episodes of syncope that occurred this morning.  Pt admitted with syncope.   Spoke with pt at bedside, who complains of decreased appetite and weakness. Pt describes herself as "a grazer" and often consumes 4-6 small meals per day (chosen foods include a piece of fruit OR half sandwich OR soup OR juice). Pt shares that she hasn't been eating as frequently over the past 2-3 weeks related to poor appetite. She complains that "nothing tastes good".   Pt reports that she underwent bariatric surgery in 2004; her UBW is around 160-170#. Pt reports concern that "I'm about 23# less than I need to be". She shares that she becomes more fatigued easily and doesn't have any energy. However, pt has only lost about 2.9% wt loss in the past year, although pt claims that she has lost about 23# in the past year, which is not consistent with wt hx.   Pt reports she tries to take one VitaFusion chewable MVI daily and does not take her iron supplement ("but I know I should"). She also consumes one Boost High Protein supplement daily. She is concerned that she is not getting enough protein- she estimates  she is getting a lot less than her goal of 60 grams per day. RD discussed ways that pt could increase calories and protein in her diet. Pt also amenable to supplement is chocolate flavored.   Labs reviewed.   NUTRITION - FOCUSED PHYSICAL EXAM:    Most Recent Value  Orbital Region  No depletion  Upper Arm Region  Mild depletion  Thoracic and Lumbar Region  No depletion  Buccal Region  No depletion  Temple Region  No depletion  Clavicle Bone Region  Mild depletion  Clavicle and Acromion Bone Region  No depletion  Scapular Bone Region  No depletion  Dorsal Hand  No depletion  Patellar Region  No depletion  Anterior Thigh Region  No depletion  Posterior Calf Region  No depletion  Edema (RD Assessment)  None  Hair  Reviewed  Eyes  Reviewed  Mouth  Reviewed  Skin  Reviewed  Nails  Reviewed       Diet Order:   Diet Order            Diet Heart Room service appropriate? Yes; Fluid consistency: Thin  Diet effective now              EDUCATION NEEDS:   Education needs have been addressed  Skin:  Skin Assessment: Reviewed RN Assessment  Last BM:  07/22/18  Height:   Ht Readings from Last 1 Encounters:  07/23/18 5\' 7"  (1.702 m)    Weight:   Wt Readings from Last 1 Encounters:  07/23/18 70.2 kg    Ideal Body Weight:  61.4 kg  BMI:  Body mass index is 24.25 kg/m.  Estimated Nutritional Needs:   Kcal:  1750-1950  Protein:  85-100 grams  Fluid:  1.7-1.9 L    Julia Wheeler A. Mayford Knife, RD, LDN, CDE Pager: [phone] After hours Pager: [phone]

## 2018-07-23 NOTE — Progress Notes (Signed)
Pt back from Echo. Pt to get 1 unit PRBC's.  Paged Dr. Delman Cheadle, advised pt back from ED if wish to discuss blood transfusion.  Also, pt on lovenox, asked if with to continue or change VTE.

## 2018-07-23 NOTE — Progress Notes (Addendum)
Progress Note  Patient Name: Julia Wheeler Date of Encounter: 07/23/2018  Primary Cardiologist: Regan Lemming, MD   Subjective   Pt still complaining of intermittent tunnel vision. She states she had intermittent chest pain over the past week. No chest pain today.  Inpatient Medications    Scheduled Meds: . sodium chloride   Intravenous Once  . ARIPiprazole  10 mg Oral Daily  . aspirin EC  325 mg Oral Daily  . atorvastatin  10 mg Oral QHS  . carvedilol  6.25 mg Oral BID WC  . enoxaparin (LOVENOX) injection  40 mg Subcutaneous Q24H  . feeding supplement (ENSURE ENLIVE)  237 mL Oral BID BM  . sertraline  200 mg Oral Daily   Continuous Infusions: . 0.9 % NaCl with KCl 20 mEq / L 75 mL/hr at 07/23/18 0909   PRN Meds: loratadine, pantoprazole   Vital Signs    Vitals:   07/23/18 0400 07/23/18 0453 07/23/18 0900 07/23/18 1003  BP:  118/87  107/82  Pulse:  77 87 87  Resp:  16  17  Temp:  98.1 F (36.7 C)    TempSrc:  Oral    SpO2:   99%   Weight: 70.2 kg     Height: 5\' 7"  (1.702 m)       Intake/Output Summary (Last 24 hours) at 07/23/2018 1037 Last data filed at 07/23/2018 0909 Gross per 24 hour  Intake 1550.38 ml  Output 400 ml  Net 1150.38 ml   Filed Weights   07/22/18 1635 07/23/18 0400  Weight: 69.4 kg 70.2 kg    Telemetry    sinus - Personally Reviewed  ECG    No new tracings - Personally Reviewed  Physical Exam   GEN: No acute distress.   Neck: No JVD Cardiac: RRR, no murmurs, rubs, or gallops.  Respiratory: Clear to auscultation bilaterally. GI: Soft, nontender, non-distended  MS: No edema; No deformity. Neuro:  Nonfocal  Psych: Normal affect   Labs    Chemistry Recent Labs  Lab 07/22/18 0753 07/23/18 0755  NA 137 136  K 3.4* 4.3  CL 99 102  CO2 26 27  GLUCOSE 150* 111*  BUN 20 16  CREATININE 1.27* 0.98  CALCIUM 9.7 9.4  PROT  --  6.6  ALBUMIN  --  3.5  AST  --  19  ALT  --  12  ALKPHOS  --  79  BILITOT  --   0.6  GFRNONAA 48* >60  GFRAA 56* >60  ANIONGAP 12 7     Hematology Recent Labs  Lab 07/22/18 0753 07/22/18 2333  WBC 4.0  --   RBC 4.10  --   HGB 8.7* 7.6*  HCT 29.9* 26.0*  MCV 72.9*  --   MCH 21.2*  --   MCHC 29.1*  --   RDW 21.5*  --   PLT 306  --     Cardiac Enzymes Recent Labs  Lab 07/22/18 1643  TROPONINI <0.03    Recent Labs  Lab 07/22/18 0813 07/22/18 1128 07/22/18 1345  TROPIPOC 0.02 0.00 0.02     BNPNo results for input(s): BNP, PROBNP in the last 168 hours.   DDimer  Recent Labs  Lab 07/22/18 1341  DDIMER 0.83*     Radiology    Dg Chest 2 View  Result Date: 07/22/2018 CLINICAL DATA:  Syncope. EXAM: CHEST - 2 VIEW COMPARISON:  Radiographs of Apr 01, 2018. FINDINGS: The heart size and mediastinal contours are within  normal limits. Both lungs are clear. No pneumothorax or pleural effusion is noted. The visualized skeletal structures are unremarkable. IMPRESSION: No active cardiopulmonary disease. Electronically Signed   By: Lupita Raider, M.D.   On: 07/22/2018 08:53   Nm Pulmonary Perf And Vent  Result Date: 07/22/2018 CLINICAL DATA:  Syncope.  Chest heaviness, palpitations. EXAM: NUCLEAR MEDICINE VENTILATION - PERFUSION LUNG SCAN TECHNIQUE: Ventilation images were obtained in multiple projections using inhaled aerosol Tc-50m DTPA. Perfusion images were obtained in multiple projections after intravenous injection of Tc-37m-MAA. RADIOPHARMACEUTICALS:  31.5 mCi of Tc-82m DTPA aerosol inhalation and 4.3 mCi Tc44m-MAA IV COMPARISON:  Chest x-ray 07/22/2018 FINDINGS: Ventilation: No focal ventilation defect. Perfusion: No wedge shaped peripheral perfusion defects to suggest acute pulmonary embolism. IMPRESSION: Normal VQ scan.  No evidence of pulmonary embolus. Electronically Signed   By: Charlett Nose M.D.   On: 07/22/2018 18:14    Cardiac Studies   Echo pending read  Echo 11/12/17: Study Conclusions - Left ventricle: The cavity size was normal.  Systolic function was   normal. The estimated ejection fraction was in the range of 50%   to 55%. Diffuse hypokinesis. Doppler parameters are consistent   with abnormal left ventricular relaxation (grade 1 diastolic   dysfunction). There was no evidence of elevated ventricular   filling pressure by Doppler parameters. - Aortic valve: There was no regurgitation. - Mitral valve: There was trivial regurgitation. - Right ventricle: Systolic function was normal. - Right atrium: The atrium was normal in size. - Pulmonic valve: There was no regurgitation. - Pulmonary arteries: Systolic pressure was within the normal   range. - Inferior vena cava: The vessel was normal in size. - Pericardium, extracardiac: There was no pericardial effusion.  Impressions: - When compared to the prior study from 02/10/2016 LVEF has slightly   improved, now low normal at 50-55%. There are hypertrabeculations   at the LV apex suspicious for a non-compaction cardiomyopathy.   Additional imaging with Definity echo contrast or a cardiac MRI   is recommended for further evaluation.  Patient Profile     52 y.o. female   Assessment & Plan    1. Syncope - telemetry with  - question dehydration - has been stressed since brother died three weeks prior   2. Nonischemic cardiomyopathy - she has been followed by Dr. Elberta Fortis - last echo 10/2017 with LVEF 50-55% - echo with hyper-trabeculations at the LV apex suspicious for non-compaction cardiomyopathy - repeat echo today, informal read with EF of 30% - consulted with Dr. Delton See who recommends cardiac MRI to question non-compaction cardiomyopathy - may not be completed until tomorrow - she states she has lower extremity swelling last week, now largely resolved - she does report chest pain, stabbing in nature and associated with SOB, over the past week - may consider ischemic evaluation   3. Hypokalemia, resolved - K today 4.3   4. Anemia - per primary -  Hb today pending, was 7.6 yesterday   5. Depression - per primary team   For questions or updates, please contact [facility] Please consult [email or web address] for contact info under        Signed, Marcelino Duster, PA  07/23/2018, 10:37 AM    I have examined the patient and reviewed assessment and plan and discussed with patient.  Agree with above as stated.  She feels better today.  WIll be getting transfusion.  She is interested in a carotid Doppler given her syncope.  THis can be done after  her transfusion.  Plan for cardiac MRI to eval for noncompaction.    Watch on tele, for any significant pauses or other etiologies of syncope related to heart rhythm.  Lance Muss

## 2018-07-23 NOTE — Progress Notes (Signed)
Orthostatic vitals completed.   Lying- BP 118/87, Pulse 77 Sitting- BP 101/73, Pulse 81 Standing- BP 86/72, Pulse 76 Patient states she feels dizzy upon primarily upon standing.

## 2018-07-23 NOTE — Progress Notes (Signed)
1 unit PRBC's completed, no apparent reaction.

## 2018-07-23 NOTE — Progress Notes (Signed)
  Echocardiogram 2D Echocardiogram has been performed.  Delcie Roch 07/23/2018, 8:52 AM

## 2018-07-23 NOTE — Progress Notes (Signed)
PROGRESS NOTE    Julia Wheeler  ZOX:[id number] DOB: 10-09-1966 DOA: 07/22/2018 PCP: Oneta Rack, NP (Confirm with patient/family/NH records and if not entered, this HAS to be entered at [facility] point of entry. "No PCP" if truly none.)   Brief Narrative:   Patient is a 52 year old African-American female with past medical history significant for hypertension, hyperlipidemia, nephrolithiasis, grade 1 diastolic dysfunction, anemia, depression and posttraumatic stress disorder amongst other medical problems.  Patient presents with 2 episodes of syncope that occurred this morning.  Patient had some vagal symptoms associated with it including everything going dark and some chest pain and shortness of breath fluttering and weakness.  Lost a sibling approximately 1 to 2 weeks ago and has a long history of major depression.  She was also noted to be profoundly orthostatic in the emergency department and with IV fluids her hemoglobin has dropped.  Echocardiogram has been obtained today and will be read by cardiology.  I am going to transfuse her 1 unit of packed red blood cells and give her additional IV fluids.  We will follow her hemoglobins closely.  Patient has a history of bypass surgery and has had anemia since that time likely thought to be due to malabsorption.  Had multiple blood transfusions in the past.  He also has a history of sickle trait.   Assessment & Plan:   Principal Problem:   Syncope Active Problems:   Anemia, iron deficiency   MDD (major depressive disorder), recurrent, severe, with psychosis (HCC)   Orthostatic hypotension   Sickle cell trait (HCC)   Chronic left systolic heart failure (HCC)   Essential hypertension   Gastric bypass status for obesity  Syncope: Continue work-up in progress Follow cardiac enzymes VQ scan to rule out PE showed no perfusion deficits Hydrate patient follow orthostatics Correct abnormal electrolytes Echocardiogram Further management  depend on hospital course. Likely related to anemia will transfuse 1 unit of packed red blood cells  Iron deficiency anemia: Patient with long history of same.  Also suspect that she may have some malabsorption.  Transfuse blood.  She may benefit from outpatient evaluation by a hematologist.  Was not taking any iron on admission.  Weakness/Fatigue/major depressive disorder with active depression: Patient is not suicidal or homicidal TSH and T4 within normal limits Adjust medications Kia tree given her excessively fell affect.  Orthostatic hypotension/volume depletion: IV fluids: We will give an additional 2 L of IV fluid. Continue to monitor. Transfuse 1 unit of packed red blood cells  Acute kidney injury: Resolved after IV fluids  Hypokalemia: Improving.   Prolonged QTc interval: Correct abnormal electrolytes Repeat EKG Minimize/avoid whenever possible medications that prolong QTc interval. Appreciate cardiology input   DVT prophylaxis: Subcu Lovenox continue as prophylaxis dose Code Status: Full code Family Communication: Patient's good friend Disposition Plan: Home eventually Consults called: Cardiology has already been consulted Admission status: Observation   Consultants:   Dr. Eldridge Dace from cardiology   Procedures:   Echocardiogram results pending  Transfusion of 1 unit of packed red blood cells  Subjective:  Patient sitting on the edge of bed.  Very flat affect.  Made absolutely no remark when I offered condolences about the death of her sibling.  Is unable to give me any useful medical history which is odd for a person of her age.  They did not mention anything about her history of anemia or her history of sickle cell trait.  She stated she did not know why her blood  counts have dropped in the past.  Objective: Vitals:   07/23/18 0900 07/23/18 1003 07/23/18 1157 07/23/18 1212  BP:  107/82 111/80 121/85  Pulse: 87 87 78 75  Resp:  17 16 17     Temp:   98.4 F (36.9 C) 98.3 F (36.8 C)  TempSrc:   Oral Oral  SpO2: 99%  100% 100%  Weight:      Height:        Intake/Output Summary (Last 24 hours) at 07/23/2018 1333 Last data filed at 07/23/2018 0909 Gross per 24 hour  Intake 1050.38 ml  Output 400 ml  Net 650.38 ml   Filed Weights   07/22/18 1635 07/23/18 0400  Weight: 69.4 kg 70.2 kg    Examination:  General exam: Appears calm and comfortable  Respiratory system: Clear to auscultation. Respiratory effort normal. Cardiovascular system: S1 & S2 heard, RRR. No JVD, rubs, gallops or clicks. No pedal edema.  Soft 1/6 systolic ejection murmur likely flow Gastrointestinal system: Abdomen is nondistended, soft and nontender. No organomegaly or masses felt. Normal bowel sounds heard. Central nervous system: Alert and oriented. No focal neurological deficits. Extremities: Symmetric 5 x 5 power. Skin: No rashes, lesions or ulcers Psychiatry: Judgement and insight appear normal. Mood & affect appropriate.     Data Reviewed: I have personally reviewed following labs and imaging studies  CBC: Recent Labs  Lab 07/22/18 0753 07/22/18 2333  WBC 4.0  --   HGB 8.7* 7.6*  HCT 29.9* 26.0*  MCV 72.9*  --   PLT 306  --    Basic Metabolic Panel: Recent Labs  Lab 07/22/18 0753 07/22/18 1643 07/23/18 0755  NA 137  --  136  K 3.4*  --  4.3  CL 99  --  102  CO2 26  --  27  GLUCOSE 150*  --  111*  BUN 20  --  16  CREATININE 1.27*  --  0.98  CALCIUM 9.7  --  9.4  MG  --  2.2  --   PHOS  --  3.7  --    GFR: Estimated Creatinine Clearance: 66 mL/min (by C-G formula based on SCr of 0.98 mg/dL). Liver Function Tests: Recent Labs  Lab 07/23/18 0755  AST 19  ALT 12  ALKPHOS 79  BILITOT 0.6  PROT 6.6  ALBUMIN 3.5   No results for input(s): LIPASE, AMYLASE in the last 168 hours. No results for input(s): AMMONIA in the last 168 hours. Coagulation Profile: No results for input(s): INR, PROTIME in the last 168  hours. Cardiac Enzymes: Recent Labs  Lab 07/22/18 1643  TROPONINI <0.03   BNP (last 3 results) No results for input(s): PROBNP in the last 8760 hours. HbA1C: No results for input(s): HGBA1C in the last 72 hours. CBG: Recent Labs  Lab 07/22/18 0808  GLUCAP 128*   Lipid Profile: No results for input(s): CHOL, HDL, LDLCALC, TRIG, CHOLHDL, LDLDIRECT in the last 72 hours. Thyroid Function Tests: Recent Labs    07/22/18 1643  TSH 0.400  FREET4 0.97   Anemia Panel: No results for input(s): VITAMINB12, FOLATE, FERRITIN, TIBC, IRON, RETICCTPCT in the last 72 hours. Sepsis Labs: No results for input(s): PROCALCITON, LATICACIDVEN in the last 168 hours.  No results found for this or any previous visit (from the past 240 hour(s)).       Radiology Studies: Dg Chest 2 View  Result Date: 07/22/2018 CLINICAL DATA:  Syncope. EXAM: CHEST - 2 VIEW COMPARISON:  Radiographs of Apr 01, 2018.  FINDINGS: The heart size and mediastinal contours are within normal limits. Both lungs are clear. No pneumothorax or pleural effusion is noted. The visualized skeletal structures are unremarkable. IMPRESSION: No active cardiopulmonary disease. Electronically Signed   By: Lupita Raider, M.D.   On: 07/22/2018 08:53   Nm Pulmonary Perf And Vent  Result Date: 07/22/2018 CLINICAL DATA:  Syncope.  Chest heaviness, palpitations. EXAM: NUCLEAR MEDICINE VENTILATION - PERFUSION LUNG SCAN TECHNIQUE: Ventilation images were obtained in multiple projections using inhaled aerosol Tc-36m DTPA. Perfusion images were obtained in multiple projections after intravenous injection of Tc-70m-MAA. RADIOPHARMACEUTICALS:  31.5 mCi of Tc-91m DTPA aerosol inhalation and 4.3 mCi Tc31m-MAA IV COMPARISON:  Chest x-ray 07/22/2018 FINDINGS: Ventilation: No focal ventilation defect. Perfusion: No wedge shaped peripheral perfusion defects to suggest acute pulmonary embolism. IMPRESSION: Normal VQ scan.  No evidence of pulmonary embolus.  Electronically Signed   By: Charlett Nose M.D.   On: 07/22/2018 18:14        Scheduled Meds: . sodium chloride   Intravenous Once  . ARIPiprazole  10 mg Oral Daily  . aspirin EC  325 mg Oral Daily  . atorvastatin  10 mg Oral QHS  . carvedilol  6.25 mg Oral BID WC  . enoxaparin (LOVENOX) injection  40 mg Subcutaneous Q24H  . feeding supplement (ENSURE ENLIVE)  237 mL Oral BID BM  . ferrous sulfate  325 mg Oral TID WC  . sertraline  200 mg Oral Daily  . vitamin C  250 mg Oral TID WC   Continuous Infusions: . 0.9 % NaCl with KCl 20 mEq / L 75 mL/hr at 07/23/18 0909     LOS: 0 days    Time spent: 45 minutes    Lahoma Crocker, MD, FACP [facility] Pager [phone] If 7PM-7AM, please contact night-coverage [email or web address] Password [email or web address] 07/23/2018, 1:33 PM

## 2018-07-23 NOTE — Plan of Care (Signed)
  Problem: Education: Goal: Knowledge of General Education information will improve Description: Including pain rating scale, medication(s)/side effects and non-pharmacologic comfort measures Outcome: Progressing   Problem: Health Behavior/Discharge Planning: Goal: Ability to manage health-related needs will improve Outcome: Progressing   Problem: Clinical Measurements: Goal: Will remain free from infection Outcome: Progressing   Problem: Activity: Goal: Risk for activity intolerance will decrease Outcome: Progressing   Problem: Nutrition: Goal: Adequate nutrition will be maintained Outcome: Progressing   Problem: Coping: Goal: Level of anxiety will decrease Outcome: Progressing   Problem: Elimination: Goal: Will not experience complications related to bowel motility Outcome: Progressing Goal: Will not experience complications related to urinary retention Outcome: Progressing   Problem: Pain Managment: Goal: General experience of comfort will improve Outcome: Progressing   Problem: Safety: Goal: Ability to remain free from injury will improve Outcome: Progressing   Problem: Skin Integrity: Goal: Risk for impaired skin integrity will decrease Outcome: Progressing   
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
## 2018-07-23 NOTE — Progress Notes (Signed)
To the best of my knowledge, documentation by Lurline Del nursing student is correct.

## 2018-07-24 DIAGNOSIS — G43A0 Cyclical vomiting, not intractable: Secondary | ICD-10-CM

## 2018-07-24 DIAGNOSIS — F333 Major depressive disorder, recurrent, severe with psychotic symptoms: Secondary | ICD-10-CM

## 2018-07-24 DIAGNOSIS — D508 Other iron deficiency anemias: Secondary | ICD-10-CM

## 2018-07-24 DIAGNOSIS — F331 Major depressive disorder, recurrent, moderate: Secondary | ICD-10-CM

## 2018-07-24 LAB — TYPE AND SCREEN
ABO/RH(D): O POS
Antibody Screen: NEGATIVE
UNIT DIVISION: 0

## 2018-07-24 LAB — BPAM RBC
Blood Product Expiration Date: 201910052359
ISSUE DATE / TIME: 201909101151
UNIT TYPE AND RH: 5100

## 2018-07-24 LAB — URINALYSIS, ROUTINE W REFLEX MICROSCOPIC
BILIRUBIN URINE: NEGATIVE
Glucose, UA: NEGATIVE mg/dL
HGB URINE DIPSTICK: NEGATIVE
KETONES UR: NEGATIVE mg/dL
Leukocytes, UA: NEGATIVE
Nitrite: NEGATIVE
PH: 6 (ref 5.0–8.0)
PROTEIN: NEGATIVE mg/dL
Specific Gravity, Urine: 1.003 — ABNORMAL LOW (ref 1.005–1.030)

## 2018-07-24 LAB — OCCULT BLOOD X 1 CARD TO LAB, STOOL: Fecal Occult Bld: NEGATIVE

## 2018-07-24 MED ORDER — MELATONIN 3 MG PO TABS
3.0000 mg | ORAL_TABLET | Freq: Every day | ORAL | Status: DC
  Administered 2018-07-24 – 2018-07-26 (×3): 3 mg via ORAL
  Filled 2018-07-24 (×3): qty 1

## 2018-07-24 MED ORDER — ONDANSETRON HCL 4 MG/2ML IJ SOLN
4.0000 mg | Freq: Four times a day (QID) | INTRAMUSCULAR | Status: DC | PRN
  Administered 2018-07-24: 4 mg via INTRAVENOUS
  Filled 2018-07-24 (×2): qty 2

## 2018-07-24 MED ORDER — PROMETHAZINE HCL 25 MG/ML IJ SOLN
12.5000 mg | Freq: Four times a day (QID) | INTRAMUSCULAR | Status: DC | PRN
  Administered 2018-07-24: 12.5 mg via INTRAVENOUS
  Filled 2018-07-24: qty 1

## 2018-07-24 MED ORDER — DICYCLOMINE HCL 10 MG PO CAPS
10.0000 mg | ORAL_CAPSULE | Freq: Three times a day (TID) | ORAL | Status: DC
  Administered 2018-07-24 – 2018-07-27 (×9): 10 mg via ORAL
  Filled 2018-07-24 (×9): qty 1

## 2018-07-24 MED ORDER — BUSPIRONE HCL 5 MG PO TABS
7.5000 mg | ORAL_TABLET | Freq: Two times a day (BID) | ORAL | Status: DC
  Administered 2018-07-24: 7.5 mg via ORAL
  Filled 2018-07-24 (×2): qty 2

## 2018-07-24 MED ORDER — ACETAMINOPHEN 325 MG PO TABS
650.0000 mg | ORAL_TABLET | ORAL | Status: DC | PRN
  Administered 2018-07-24 – 2018-07-26 (×2): 650 mg via ORAL
  Filled 2018-07-24 (×2): qty 2

## 2018-07-24 NOTE — Progress Notes (Addendum)
Patient not able to go down to MRI due to vomiting. Awaiting on new orders. MRI staff informed.

## 2018-07-24 NOTE — Progress Notes (Addendum)
Patient nauseous since this morning, Zofran given, stoped vomiting but patient is complaining of being nauseous.  Attempted but than she refused all morning medication (medication wasted). Tylenol is given for pain, however pain is not improving 6/10. Patient states pain is now located in left abdomen and radiates to right back. BP elevated as well. No PRN to give. Paged MD Dhungel N and informed. MD is coming to see the patient.  Markese Bloxham, RN

## 2018-07-24 NOTE — Progress Notes (Signed)
Progress Note  Patient Name: Julia Wheeler Date of Encounter: 07/24/2018  Primary Cardiologist: Will Jorja Loa, MD   Subjective   Feeling nauseated.  Left lower abdominal pain.    Inpatient Medications    Scheduled Meds: . ARIPiprazole  10 mg Oral Daily  . aspirin EC  325 mg Oral Daily  . atorvastatin  10 mg Oral QHS  . carvedilol  6.25 mg Oral BID WC  . enoxaparin (LOVENOX) injection  40 mg Subcutaneous Q24H  . feeding supplement (ENSURE ENLIVE)  237 mL Oral BID BM  . ferrous sulfate  325 mg Oral TID WC  . multivitamin with minerals  1 tablet Oral Daily  . sertraline  200 mg Oral Daily  . vitamin C  250 mg Oral TID WC   Continuous Infusions: . 0.9 % NaCl with KCl 20 mEq / L 75 mL/hr at 07/24/18 0140   PRN Meds: acetaminophen, loratadine, ondansetron (ZOFRAN) IV, pantoprazole   Vital Signs    Vitals:   07/24/18 0503 07/24/18 0504 07/24/18 0507 07/24/18 0944  BP:    (!) 151/103  Pulse: 75 86 89 71  Resp:    18  Temp:      TempSrc:      SpO2:    99%  Weight:      Height:        Intake/Output Summary (Last 24 hours) at 07/24/2018 1014 Last data filed at 07/24/2018 0300 Gross per 24 hour  Intake 1940.76 ml  Output -  Net 1940.76 ml   Filed Weights   07/22/18 1635 07/23/18 0400 07/24/18 0221  Weight: 69.4 kg 70.2 kg 71.7 kg    Telemetry    NSR - Personally Reviewed  ECG      Physical Exam   GEN: Actively vomiting  Neck: No JVD Cardiac: RRR, no murmurs, rubs, or gallops.  Respiratory: Clear to auscultation bilaterally. GI: Soft, nontender, non-distended  MS: No edema; No deformity. Neuro:  Nonfocal  Psych: Normal affect   Labs    Chemistry Recent Labs  Lab 07/22/18 0753 07/23/18 0755  NA 137 136  K 3.4* 4.3  CL 99 102  CO2 26 27  GLUCOSE 150* 111*  BUN 20 16  CREATININE 1.27* 0.98  CALCIUM 9.7 9.4  PROT  --  6.6  ALBUMIN  --  3.5  AST  --  19  ALT  --  12  ALKPHOS  --  79  BILITOT  --  0.6  GFRNONAA 48* >60    GFRAA 56* >60  ANIONGAP 12 7     Hematology Recent Labs  Lab 07/22/18 0753 07/22/18 2333 07/23/18 1919  WBC 4.0  --   --   RBC 4.10  --   --   HGB 8.7* 7.6* 8.9*  HCT 29.9* 26.0* 29.6*  MCV 72.9*  --   --   MCH 21.2*  --   --   MCHC 29.1*  --   --   RDW 21.5*  --   --   PLT 306  --   --     Cardiac Enzymes Recent Labs  Lab 07/22/18 1643  TROPONINI <0.03    Recent Labs  Lab 07/22/18 0813 07/22/18 1128 07/22/18 1345  TROPIPOC 0.02 0.00 0.02     BNPNo results for input(s): BNP, PROBNP in the last 168 hours.   DDimer  Recent Labs  Lab 07/22/18 1341  DDIMER 0.83*     Radiology    Nm Pulmonary Perf And  Vent  Result Date: 07/22/2018 CLINICAL DATA:  Syncope.  Chest heaviness, palpitations. EXAM: NUCLEAR MEDICINE VENTILATION - PERFUSION LUNG SCAN TECHNIQUE: Ventilation images were obtained in multiple projections using inhaled aerosol Tc-81m DTPA. Perfusion images were obtained in multiple projections after intravenous injection of Tc-38m-MAA. RADIOPHARMACEUTICALS:  31.5 mCi of Tc-79m DTPA aerosol inhalation and 4.3 mCi Tc96m-MAA IV COMPARISON:  Chest x-ray 07/22/2018 FINDINGS: Ventilation: No focal ventilation defect. Perfusion: No wedge shaped peripheral perfusion defects to suggest acute pulmonary embolism. IMPRESSION: Normal VQ scan.  No evidence of pulmonary embolus. Electronically Signed   By: Charlett Nose M.D.   On: 07/22/2018 18:14    Cardiac Studies   Echo: LVEF 40%  Patient Profile     52 y.o. female with syncope, anemia  Assessment & Plan    S/p transfusion- Hbg better.  No arrhythmia on tele.  Nausea- will have to postpone cardiac MRI to a time when she can lie still as she is vomiting at the time when MRI called for the patient.   Continue medical therapy for LV dysfunction.  No ACE-I due to facial swelling reported with lisinopril.     For questions or updates, please contact [facility] Please consult [email or web address] for contact info  under        Signed, Lance Muss, MD  07/24/2018, 10:14 AM

## 2018-07-24 NOTE — Progress Notes (Signed)
Tylenol and Zofran administered.  Breia Ocampo, RN

## 2018-07-24 NOTE — Progress Notes (Signed)
PROGRESS NOTE                                                                                                                                                                                                             Patient Demographics:    Julia Wheeler, is a 52 y.o. female, DOB - [birth year], CZY:[id number]  Admit date - 07/22/2018   Admitting Physician Barnetta Chapel, MD  Outpatient Primary MD for the patient is Oneta Rack, NP  LOS - 1  Outpatient Specialists: none  Chief Complaint  Patient presents with  . Loss of Consciousness       Brief Narrative   52 year old female with history of hypertension, hyperlipidemia, nephrolithiasis, nonischemic cardiomyopathy ,iron deficiency anemia,  anemia, depression and posttraumatic stress disorder presented with an episode of syncope.  He will history of major depression and recently lost 1 of her sibling. Patient was found to be significantly orthostatic in the ED.  2D echo showing EF of 45% the following.    Subjective:   Patient complains of crampy left lower quadrant abdominal pain and  nauseous all morning with 2 episode of vomiting.   Assessment  & Plan :    Principal Problem:   Syncope Suspect orthostatic due to dehydration.  Given IV fluids with improvement.  Patient reports ongoing stress for past few weeks since her brother died.  Active Problems: Nausea and vomiting with left lower quadrant abdominal pain No abdominal distention or tenderness.  Not sure she has irritable bowel.  Continue Tylenol for pain.  Denies constipation or dysuria.  Will add tramadol if not improved.  Nausea and vomiting not improved with Zofran.  Will add Phenergan as needed.  Add dicyclomine.  Check UA.  Nonischemic cardiomyopathy 2D echo with EF of 40-45% with mild diffuse hypokinesis.  Cardiology recommends getting cardiac MRI.  Could not be done today as patient complaining  of lower abdominal pain and vomiting.  Will schedule for later today or tomorrow. Continue aspirin, Coreg and statin.  Not on ACE inhibitor due to allergy.  Iron deficiency anemia Reportedly patient on iron supplement but not adherent.  Has had colonoscopy EGD in the past 2 years.  Has had gastric bypass surgery.  She received 1 unit PRBC with improvement.  Continue iron supplement.     Essential hypertension  Continue Coreg  MDD (major depressive disorder), recurrent, severe, with psychosis (HCC) Symptoms worsened with recent loss of her brother.  Continue Zoloft.  Denies suicidal ideation.  Psych consulted  Hypokalemia Replenished  Acute kidney injury Secondary to dehydration resolved with IV fluids.   Generalized weakness Normal TSH and B12.  Code Status : Full code  Family Communication  : None at bedside  Disposition Plan  : Home once clinically improved and following cardiac MRI.  Possibly tomorrow  Barriers For Discharge : To symptoms  Consults  : Cardiology   Procedures  : 2D echo  DVT Prophylaxis  :  Lovenox -   Lab Results  Component Value Date   PLT 306 07/22/2018    Antibiotics  :   Anti-infectives (From admission, onward)   None        Objective:   Vitals:   07/24/18 0504 07/24/18 0507 07/24/18 0944 07/24/18 1237  BP:   (!) 151/103 (!) 159/102  Pulse: 86 89 71 75  Resp:   18 20  Temp:    98.1 F (36.7 C)  TempSrc:    Oral  SpO2:   99% 100%  Weight:      Height:        Wt Readings from Last 3 Encounters:  07/24/18 71.7 kg  11/16/17 75 kg  09/25/17 80.5 kg     Intake/Output Summary (Last 24 hours) at 07/24/2018 1352 Last data filed at 07/24/2018 0300 Gross per 24 hour  Intake 2040.76 ml  Output -  Net 2040.76 ml     Physical Exam  Gen: not in distress, appears fatigued HEENT: Pallor present, moist mucosa, supple neck Chest: clear b/l, no added sounds CVS: N S1&S2, no murmurs, rubs or gallop GI: soft, bowel sounds  present, nondistended, no left lower quadrant or left flank tenderness. Musculoskeletal: warm, no edema     Data Review:    CBC Recent Labs  Lab 07/22/18 0753 07/22/18 2333 07/23/18 1919  WBC 4.0  --   --   HGB 8.7* 7.6* 8.9*  HCT 29.9* 26.0* 29.6*  PLT 306  --   --   MCV 72.9*  --   --   MCH 21.2*  --   --   MCHC 29.1*  --   --   RDW 21.5*  --   --     Chemistries  Recent Labs  Lab 07/22/18 0753 07/22/18 1643 07/23/18 0755  NA 137  --  136  K 3.4*  --  4.3  CL 99  --  102  CO2 26  --  27  GLUCOSE 150*  --  111*  BUN 20  --  16  CREATININE 1.27*  --  0.98  CALCIUM 9.7  --  9.4  MG  --  2.2  --   AST  --   --  19  ALT  --   --  12  ALKPHOS  --   --  79  BILITOT  --   --  0.6   ------------------------------------------------------------------------------------------------------------------ No results for input(s): CHOL, HDL, LDLCALC, TRIG, CHOLHDL, LDLDIRECT in the last 72 hours.  Lab Results  Component Value Date   HGBA1C 5.8 (H) 11/11/2017   ------------------------------------------------------------------------------------------------------------------ Recent Labs    07/22/18 1643  TSH 0.400   ------------------------------------------------------------------------------------------------------------------ No results for input(s): VITAMINB12, FOLATE, FERRITIN, TIBC, IRON, RETICCTPCT in the last 72 hours.  Coagulation profile No results for input(s): INR, PROTIME in the last 168 hours.  Recent Labs    07/22/18 1341  DDIMER 0.83*    Cardiac Enzymes Recent Labs  Lab 07/22/18 1643  TROPONINI <0.03   ------------------------------------------------------------------------------------------------------------------    Component Value Date/Time   BNP 26.5 04/01/2018 1408    Inpatient Medications  Scheduled Meds: . ARIPiprazole  10 mg Oral Daily  . aspirin EC  325 mg Oral Daily  . atorvastatin  10 mg Oral QHS  . carvedilol  6.25 mg Oral  BID WC  . enoxaparin (LOVENOX) injection  40 mg Subcutaneous Q24H  . feeding supplement (ENSURE ENLIVE)  237 mL Oral BID BM  . ferrous sulfate  325 mg Oral TID WC  . multivitamin with minerals  1 tablet Oral Daily  . sertraline  200 mg Oral Daily  . vitamin C  250 mg Oral TID WC   Continuous Infusions: . 0.9 % NaCl with KCl 20 mEq / L 75 mL/hr at 07/24/18 0140   PRN Meds:.acetaminophen, loratadine, ondansetron (ZOFRAN) IV, pantoprazole, promethazine  Micro Results No results found for this or any previous visit (from the past 240 hour(s)).  Radiology Reports Dg Chest 2 View  Result Date: 07/22/2018 CLINICAL DATA:  Syncope. EXAM: CHEST - 2 VIEW COMPARISON:  Radiographs of Apr 01, 2018. FINDINGS: The heart size and mediastinal contours are within normal limits. Both lungs are clear. No pneumothorax or pleural effusion is noted. The visualized skeletal structures are unremarkable. IMPRESSION: No active cardiopulmonary disease. Electronically Signed   By: Lupita Raider, M.D.   On: 07/22/2018 08:53   Nm Pulmonary Perf And Vent  Result Date: 07/22/2018 CLINICAL DATA:  Syncope.  Chest heaviness, palpitations. EXAM: NUCLEAR MEDICINE VENTILATION - PERFUSION LUNG SCAN TECHNIQUE: Ventilation images were obtained in multiple projections using inhaled aerosol Tc-9m DTPA. Perfusion images were obtained in multiple projections after intravenous injection of Tc-32m-MAA. RADIOPHARMACEUTICALS:  31.5 mCi of Tc-58m DTPA aerosol inhalation and 4.3 mCi Tc18m-MAA IV COMPARISON:  Chest x-ray 07/22/2018 FINDINGS: Ventilation: No focal ventilation defect. Perfusion: No wedge shaped peripheral perfusion defects to suggest acute pulmonary embolism. IMPRESSION: Normal VQ scan.  No evidence of pulmonary embolus. Electronically Signed   By: Charlett Nose M.D.   On: 07/22/2018 18:14    Time Spent in minutes  25   Xavi Tomasik M.D on 07/24/2018 at 1:52 PM  Between 7am to 7pm - Pager - [phone]  After 7pm go to  [email or web address] - password [facility]  [facility] -  Office  [phone]

## 2018-07-24 NOTE — Progress Notes (Signed)
Patient c/o pain and nausea, vomiting. Paged MD Dhungel for PRN order. Cardilogy at bedside. Aware.  Khristina Janota, RN

## 2018-07-25 ENCOUNTER — Inpatient Hospital Stay (HOSPITAL_COMMUNITY): Payer: Self-pay

## 2018-07-25 DIAGNOSIS — I428 Other cardiomyopathies: Secondary | ICD-10-CM

## 2018-07-25 LAB — CBC
HCT: 29.8 % — ABNORMAL LOW (ref 36.0–46.0)
Hemoglobin: 8.9 g/dL — ABNORMAL LOW (ref 12.0–15.0)
MCH: 22.1 pg — AB (ref 26.0–34.0)
MCHC: 29.9 g/dL — ABNORMAL LOW (ref 30.0–36.0)
MCV: 73.9 fL — ABNORMAL LOW (ref 78.0–100.0)
Platelets: 283 10*3/uL (ref 150–400)
RBC: 4.03 MIL/uL (ref 3.87–5.11)
RDW: 21.3 % — AB (ref 11.5–15.5)
WBC: 5.6 10*3/uL (ref 4.0–10.5)

## 2018-07-25 LAB — GLUCOSE, CAPILLARY: Glucose-Capillary: 74 mg/dL (ref 70–99)

## 2018-07-25 MED ORDER — GADOBUTROL 1 MMOL/ML IV SOLN
10.0000 mL | Freq: Once | INTRAVENOUS | Status: AC | PRN
  Administered 2018-07-25: 10 mL via INTRAVENOUS

## 2018-07-25 MED ORDER — MIRTAZAPINE 15 MG PO TABS
30.0000 mg | ORAL_TABLET | Freq: Every day | ORAL | Status: DC
  Administered 2018-07-25 – 2018-07-26 (×2): 30 mg via ORAL
  Filled 2018-07-25 (×2): qty 2

## 2018-07-25 MED ORDER — PHENOL 1.4 % MT LIQD
1.0000 | OROMUCOSAL | Status: DC | PRN
  Administered 2018-07-26: 1 via OROMUCOSAL
  Filled 2018-07-25: qty 177

## 2018-07-25 NOTE — Progress Notes (Signed)
Progress Note  Patient Name: Julia Wheeler Date of Encounter: 07/25/2018  Primary Cardiologist: Will Jorja Loa, MD   Subjective   Nausea better  Inpatient Medications    Scheduled Meds: . ARIPiprazole  10 mg Oral Daily  . aspirin EC  325 mg Oral Daily  . atorvastatin  10 mg Oral QHS  . carvedilol  6.25 mg Oral BID WC  . dicyclomine  10 mg Oral TID AC  . enoxaparin (LOVENOX) injection  40 mg Subcutaneous Q24H  . feeding supplement (ENSURE ENLIVE)  237 mL Oral BID BM  . ferrous sulfate  325 mg Oral TID WC  . Melatonin  3 mg Oral QHS  . mirtazapine  30 mg Oral QHS  . multivitamin with minerals  1 tablet Oral Daily  . sertraline  200 mg Oral Daily  . vitamin C  250 mg Oral TID WC   Continuous Infusions:  PRN Meds: acetaminophen, loratadine, ondansetron (ZOFRAN) IV, pantoprazole, promethazine   Vital Signs    Vitals:   07/24/18 1737 07/24/18 1938 07/25/18 0510 07/25/18 1240  BP: 125/88 111/81 134/86 (!) 122/92  Pulse: 79 76 71 72  Resp: 18 18 18 18   Temp:  98.5 F (36.9 C) 98.3 F (36.8 C) 98.3 F (36.8 C)  TempSrc:  Oral Oral Oral  SpO2: 99% 97% 100% 100%  Weight:   70.7 kg   Height:        Intake/Output Summary (Last 24 hours) at 07/25/2018 1319 Last data filed at 07/25/2018 0510 Gross per 24 hour  Intake 120 ml  Output 1800 ml  Net -1680 ml   Filed Weights   07/23/18 0400 07/24/18 0221 07/25/18 0510  Weight: 70.2 kg 71.7 kg 70.7 kg    Telemetry    NSR - Personally Reviewed  ECG     - Personally Reviewed  Physical Exam   GEN: No acute distress.   Neck: No JVD Cardiac: RRR, no murmurs, rubs, or gallops.  Respiratory: Clear to auscultation bilaterally. GI: Soft, nontender, non-distended  MS: No edema; No deformity. Neuro:  Nonfocal  Psych: Normal affect   Labs    Chemistry Recent Labs  Lab 07/22/18 0753 07/23/18 0755  NA 137 136  K 3.4* 4.3  CL 99 102  CO2 26 27  GLUCOSE 150* 111*  BUN 20 16  CREATININE 1.27* 0.98    CALCIUM 9.7 9.4  PROT  --  6.6  ALBUMIN  --  3.5  AST  --  19  ALT  --  12  ALKPHOS  --  79  BILITOT  --  0.6  GFRNONAA 48* >60  GFRAA 56* >60  ANIONGAP 12 7     Hematology Recent Labs  Lab 07/22/18 0753 07/22/18 2333 07/23/18 1919 07/25/18 0500  WBC 4.0  --   --  5.6  RBC 4.10  --   --  4.03  HGB 8.7* 7.6* 8.9* 8.9*  HCT 29.9* 26.0* 29.6* 29.8*  MCV 72.9*  --   --  73.9*  MCH 21.2*  --   --  22.1*  MCHC 29.1*  --   --  29.9*  RDW 21.5*  --   --  21.3*  PLT 306  --   --  283    Cardiac Enzymes Recent Labs  Lab 07/22/18 1643  TROPONINI <0.03    Recent Labs  Lab 07/22/18 0813 07/22/18 1128 07/22/18 1345  TROPIPOC 0.02 0.00 0.02     BNPNo results for input(s): BNP, PROBNP  in the last 168 hours.   DDimer  Recent Labs  Lab 07/22/18 1341  DDIMER 0.83*     Radiology    No results found.  Cardiac Studies   Cardiac MRI pending  Patient Profile     52 y.o. female with syncope  Assessment & Plan    1) Await MRI results.  COntinuing to treat anemia and dehydration to avoid syncope.    If significant structural heart abnormality or decreased EF, may need EP eval.     For questions or updates, please contact [facility] Please consult [email or web address] for contact info under        Signed, Lance Muss, MD  07/25/2018, 1:19 PM

## 2018-07-25 NOTE — Progress Notes (Signed)
I was asked to f/u on cMRI. Dr. Delton See called me with result, does confirm LV dysfunction with EF 40% with diffuse HK, suggestive of NICM distribution. There is no evidence of ARVC, noncompaction or infiltrative disease. I spoke with Dr. Elberta Fortis and he agrees that EP consultation would be reasonable given syncope, low EF, QT prolongation. Their team will see in AM. Will f/u BMET, CBC and EKG given continued mild QT prolongation on telemetry ( ). Will make NPO after midnight in case EP study is felt indicated, given pt's family history of syncope/sudden cardiac death. Discussed possible event monitor vs loop with patient. She is agreeable to stay for EP consult in AM and is eager to hear what they have to say. Also updated Dr. Gonzella Lex of plan.  Dayna Dunn PA-C

## 2018-07-25 NOTE — Progress Notes (Signed)
PROGRESS NOTE                                                                                                                                                                                                             Patient Demographics:    Julia Wheeler, is a 52 y.o. female, DOB - 02/02/1966, ONG:[id number]  Admit date - 07/22/2018   Admitting Physician Barnetta Chapel, MD  Outpatient Primary MD for the patient is Oneta Rack, NP  LOS - 2  Outpatient Specialists: none  Chief Complaint  Patient presents with  . Loss of Consciousness       Brief Narrative   52 year old female with history of hypertension, hyperlipidemia, nephrolithiasis, nonischemic cardiomyopathy ,iron deficiency anemia,  anemia, depression and posttraumatic stress disorder presented with an episode of syncope.  He will history of major depression and recently lost 1 of her sibling. Patient was found to be significantly orthostatic in the ED.  2D echo showing EF of 45% the following.    Subjective:   Nausea and abd pain improving this am. stil has some crampy lower abd pain   Assessment  & Plan :    Principal Problem:   Syncope Suspect orthostatic due to dehydration.  Given IV fluids with improvement.  Patient reports ongoing stress for past few weeks since her brother died.  Active Problems: Nausea and vomiting with left lower quadrant abdominal pain No abdominal distention or tenderness.  Not sure she has irritable bowel.  Continue Tylenol for pain.  Denies constipation or dysuria.  Will add tramadol if not improved.  Nausea and vomiting not improved with Zofran.  Will add Phenergan as needed.  Add dicyclomine.  Check UA.  Nonischemic cardiomyopathy 2D echo with EF of 40-45% with mild diffuse hypokinesis.  Cardiology recommends getting cardiac MRI.  Could not be done today as patient complaining of lower abdominal pain and vomiting.  Done  this afternoon, result pending. If low EF od structural abnormality , abnormal cardiology plan on consulting EP.  also found that her brother had syncope and low HR when he was hospitalized and died at the age of 25. He was told to have a weak heart. Also reports her father diagnosed of CHF in his 25s and both grandparents died in their late 11s-50s with heart disease. Will discuss about this with  cardiology as she would need EP evaluation. Continue aspirin, Coreg and statin.  Not on ACE inhibitor due to allergy.  Iron deficiency anemia Reportedly patient on iron supplement but not adherent.  Has had colonoscopy EGD in the past 2 years.  Has had gastric bypass surgery.  She received 1 unit PRBC with improvement.  Hemoccult negative. Continue iron supplement.     Essential hypertension  Continue Coreg     MDD (major depressive disorder), recurrent, severe, with psychosis (HCC) Symptoms worsened with recent loss of her brother.  Continue Zoloft.  Denies suicidal ideation.  Psych consulted. recommended resuming her home dose remeron, trazodone,abilify and zoloft. Added nighttime melatonin for insomnia which has helped. Pt had received ECT in the past and recommended second course of ECT or TMS. I have discussed with pt about this and she plans to see her psychiatrist upon discharge.   Hypokalemia Replenished  Acute kidney injury Secondary to dehydration resolved with IV fluids.   Generalized weakness Normal TSH and B12.  Code Status : Full code  Family Communication  : None at bedside  Disposition Plan  : pending cardiac MRI results  Barriers For Discharge : improving sx  Consults  : Cardiology   Procedures  : 2D echo, cardiac MRI  DVT Prophylaxis  :  Lovenox -   Lab Results  Component Value Date   PLT 283 07/25/2018    Antibiotics  :   Anti-infectives (From admission, onward)   None        Objective:   Vitals:   07/24/18 1938 07/25/18 0510 07/25/18 1240 07/25/18  1615  BP: 111/81 134/86 (!) 122/92 119/88  Pulse: 76 71 72 76  Resp: 18 18 18 20   Temp: 98.5 F (36.9 C) 98.3 F (36.8 C) 98.3 F (36.8 C) 98.6 F (37 C)  TempSrc: Oral Oral Oral Oral  SpO2: 97% 100% 100% 99%  Weight:  70.7 kg    Height:        Wt Readings from Last 3 Encounters:  07/25/18 70.7 kg  11/16/17 75 kg  09/25/17 80.5 kg     Intake/Output Summary (Last 24 hours) at 07/25/2018 1642 Last data filed at 07/25/2018 1407 Gross per 24 hour  Intake 360 ml  Output 1800 ml  Net -1440 ml   Physical exam NAD  heent: Pallor, moist mucosa Chest: clear b/l  CVS: NS1&S2, no murmurs  GI: soft, NT, ND, BS+ Musculoskeletal: warm, no edema        Data Review:    CBC Recent Labs  Lab 07/22/18 0753 07/22/18 2333 07/23/18 1919 07/25/18 0500  WBC 4.0  --   --  5.6  HGB 8.7* 7.6* 8.9* 8.9*  HCT 29.9* 26.0* 29.6* 29.8*  PLT 306  --   --  283  MCV 72.9*  --   --  73.9*  MCH 21.2*  --   --  22.1*  MCHC 29.1*  --   --  29.9*  RDW 21.5*  --   --  21.3*    Chemistries  Recent Labs  Lab 07/22/18 0753 07/22/18 1643 07/23/18 0755  NA 137  --  136  K 3.4*  --  4.3  CL 99  --  102  CO2 26  --  27  GLUCOSE 150*  --  111*  BUN 20  --  16  CREATININE 1.27*  --  0.98  CALCIUM 9.7  --  9.4  MG  --  2.2  --   AST  --   --  19  ALT  --   --  12  ALKPHOS  --   --  79  BILITOT  --   --  0.6   ------------------------------------------------------------------------------------------------------------------ No results for input(s): CHOL, HDL, LDLCALC, TRIG, CHOLHDL, LDLDIRECT in the last 72 hours.  Lab Results  Component Value Date   HGBA1C 5.8 (H) 11/11/2017   ------------------------------------------------------------------------------------------------------------------ Recent Labs    07/22/18 1643  TSH 0.400   ------------------------------------------------------------------------------------------------------------------ No results for input(s):  VITAMINB12, FOLATE, FERRITIN, TIBC, IRON, RETICCTPCT in the last 72 hours.  Coagulation profile No results for input(s): INR, PROTIME in the last 168 hours.  No results for input(s): DDIMER in the last 72 hours.  Cardiac Enzymes Recent Labs  Lab 07/22/18 1643  TROPONINI <0.03   ------------------------------------------------------------------------------------------------------------------    Component Value Date/Time   BNP 26.5 04/01/2018 1408    Inpatient Medications  Scheduled Meds: . ARIPiprazole  10 mg Oral Daily  . aspirin EC  325 mg Oral Daily  . atorvastatin  10 mg Oral QHS  . carvedilol  6.25 mg Oral BID WC  . dicyclomine  10 mg Oral TID AC  . enoxaparin (LOVENOX) injection  40 mg Subcutaneous Q24H  . feeding supplement (ENSURE ENLIVE)  237 mL Oral BID BM  . ferrous sulfate  325 mg Oral TID WC  . Melatonin  3 mg Oral QHS  . mirtazapine  30 mg Oral QHS  . multivitamin with minerals  1 tablet Oral Daily  . sertraline  200 mg Oral Daily  . vitamin C  250 mg Oral TID WC   Continuous Infusions:  PRN Meds:.acetaminophen, loratadine, ondansetron (ZOFRAN) IV, pantoprazole, promethazine  Micro Results No results found for this or any previous visit (from the past 240 hour(s)).  Radiology Reports Dg Chest 2 View  Result Date: 07/22/2018 CLINICAL DATA:  Syncope. EXAM: CHEST - 2 VIEW COMPARISON:  Radiographs of Apr 01, 2018. FINDINGS: The heart size and mediastinal contours are within normal limits. Both lungs are clear. No pneumothorax or pleural effusion is noted. The visualized skeletal structures are unremarkable. IMPRESSION: No active cardiopulmonary disease. Electronically Signed   By: Lupita Raider, M.D.   On: 07/22/2018 08:53   Nm Pulmonary Perf And Vent  Result Date: 07/22/2018 CLINICAL DATA:  Syncope.  Chest heaviness, palpitations. EXAM: NUCLEAR MEDICINE VENTILATION - PERFUSION LUNG SCAN TECHNIQUE: Ventilation images were obtained in multiple projections  using inhaled aerosol Tc-93m DTPA. Perfusion images were obtained in multiple projections after intravenous injection of Tc-4m-MAA. RADIOPHARMACEUTICALS:  31.5 mCi of Tc-30m DTPA aerosol inhalation and 4.3 mCi Tc58m-MAA IV COMPARISON:  Chest x-ray 07/22/2018 FINDINGS: Ventilation: No focal ventilation defect. Perfusion: No wedge shaped peripheral perfusion defects to suggest acute pulmonary embolism. IMPRESSION: Normal VQ scan.  No evidence of pulmonary embolus. Electronically Signed   By: Charlett Nose M.D.   On: 07/22/2018 18:14    Time Spent in minutes  25   Rajanee Schuelke M.D on 07/25/2018 at 4:42 PM  Between 7am to 7pm - Pager - [phone]  After 7pm go to [email or web address] - password [facility]  [facility] -  Office  [phone]

## 2018-07-26 ENCOUNTER — Other Ambulatory Visit: Payer: Self-pay

## 2018-07-26 DIAGNOSIS — F331 Major depressive disorder, recurrent, moderate: Secondary | ICD-10-CM

## 2018-07-26 DIAGNOSIS — I429 Cardiomyopathy, unspecified: Secondary | ICD-10-CM

## 2018-07-26 DIAGNOSIS — Z9884 Bariatric surgery status: Secondary | ICD-10-CM

## 2018-07-26 LAB — BASIC METABOLIC PANEL
ANION GAP: 8 (ref 5–15)
BUN: 11 mg/dL (ref 6–20)
CO2: 27 mmol/L (ref 22–32)
Calcium: 9.3 mg/dL (ref 8.9–10.3)
Chloride: 104 mmol/L (ref 98–111)
Creatinine, Ser: 0.88 mg/dL (ref 0.44–1.00)
GFR calc Af Amer: >60 mL/min (ref >60)
GLUCOSE: 92 mg/dL (ref 70–99)
POTASSIUM: 4 mmol/L (ref 3.5–5.1)
Sodium: 139 mmol/L (ref 135–145)

## 2018-07-26 LAB — GLUCOSE, CAPILLARY
Glucose-Capillary: 96 mg/dL (ref 70–99)
Glucose-Capillary: 88 mg/dL (ref 70–99)
Glucose-Capillary: 123 mg/dL — ABNORMAL HIGH (ref 70–99)

## 2018-07-26 LAB — CBC
HEMATOCRIT: 30.8 % — AB (ref 36.0–46.0)
HEMOGLOBIN: 9.2 g/dL — AB (ref 12.0–15.0)
MCH: 22.1 pg — AB (ref 26.0–34.0)
MCHC: 29.9 g/dL — ABNORMAL LOW (ref 30.0–36.0)
MCV: 74 fL — AB (ref 78.0–100.0)
Platelets: 295 10*3/uL (ref 150–400)
RBC: 4.16 MIL/uL (ref 3.87–5.11)
RDW: 21.8 % — AB (ref 11.5–15.5)
WBC: 6.8 10*3/uL (ref 4.0–10.5)

## 2018-07-26 NOTE — Progress Notes (Signed)
PROGRESS NOTE    Julia Wheeler  UJW:[id number] DOB: Jun 23, 1966 DOA: 07/22/2018 PCP: Oneta Rack, NP    Brief Narrative:  52 year old female with history of hypertension, hyperlipidemia, nephrolithiasis, nonischemic cardiomyopathy ,iron deficiency anemia,  anemia, depression and posttraumatic stress disorder presented with an episode of syncope.  He will history of major depression and recently lost 1 of her sibling. Patient was found to be significantly orthostatic in the ED.  2D echo showing EF of 45% the following.  Assessment & Plan:   Principal Problem:   MDD (major depressive disorder), recurrent episode, moderate (HCC) Active Problems:   Sickle cell trait (HCC)   Essential hypertension   Anemia, iron deficiency   MDD (major depressive disorder), recurrent, severe, with psychosis (HCC)   Chronic left systolic heart failure (HCC)   Gastric bypass status for obesity   Syncope   Orthostatic hypotension   Principal Problem:   Syncope -Suspected orthostasis secondary to dehydration.   -Improved with IVF hydration.  Active Problems: Nausea and vomiting with left lower quadrant abdominal pain No abdominal distention or tenderness.   -Continued on  Tylenol for pain.  Continued on dicyclomine with antiemetics. -UA reviewed, unremarkable  Nonischemic cardiomyopathy -2D echo with EF of 40-45% with mild diffuse hypokinesis.   -Cardiology recommended cardiac MRI. Findings of moderately decreased systolic function with diffuse hypokinesis and paradoxical septal motion. -No evidence of infiltrative or inflammatory cardiomyopathy -Cardiology has since recommended EP eval, consulted  Iron deficiency anemia -Reportedly patient on iron supplement but not adherent.   -s/p 1 unit PRBC transfusion with PO iron supplementation -Hemodynamically stable -Repeat cbc in AM    Essential hypertension -BP stable at present -Continue coreg as tolerated    MDD (major  depressive disorder), recurrent, severe, with psychosis (HCC) Symptoms worsened with recent loss of her brother.   -Continue Zoloft.  Denies suicidal ideation.   -Psych was consulted with recommendations for resuming her home dose remeron, trazodone,abilify and zoloft. Added nighttime melatonin for insomnia which has helped. Pt had received ECT in the past and recommended second course of ECT or TMS.  -Patient to follow up with Psychiatrist when discharged  Hypokalemia -replaced -labs reviewed  Acute kidney injury -Likely secondary to dehydration -Resolved. Labs reviewed  Generalized weakness -Labs reviewed. Noted to have normal TSH and B12.   DVT prophylaxis: Lovenox subQ Code Status: Full Family Communication: Pt in room, family not at bedside Disposition Plan: Uncertain at this time  Consultants:   Cardiology  EP  Procedures:     Antimicrobials: Anti-infectives (From admission, onward)   None       Subjective: Without complaints  Objective: Vitals:   07/25/18 1615 07/25/18 1955 07/26/18 0500 07/26/18 1210  BP: 119/88 121/84 123/89 (!) 124/91  Pulse: 76 82 87 77  Resp: 20 18 18 20   Temp: 98.6 F (37 C) 99.5 F (37.5 C) 99.6 F (37.6 C) 98.9 F (37.2 C)  TempSrc: Oral Oral Oral Oral  SpO2: 99% 99% 98% 99%  Weight:   70.9 kg   Height:        Intake/Output Summary (Last 24 hours) at 07/26/2018 1708 Last data filed at 07/26/2018 0958 Gross per 24 hour  Intake 340 ml  Output 800 ml  Net -460 ml   Filed Weights   07/24/18 0221 07/25/18 0510 07/26/18 0500  Weight: 71.7 kg 70.7 kg 70.9 kg    Examination:  General exam: Appears calm and comfortable  Respiratory system: Clear to auscultation. Respiratory effort normal.  Cardiovascular system: S1 & S2 heard, RRR Gastrointestinal system: Abdomen is nondistended, soft and nontender. No organomegaly or masses felt. Normal bowel sounds heard. Central nervous system: Alert and oriented. No focal  neurological deficits. Extremities: Symmetric 5 x 5 power. Skin: No rashes, lesions Psychiatry: Judgement and insight appear normal. Mood & affect appropriate.   Data Reviewed: I have personally reviewed following labs and imaging studies  CBC: Recent Labs  Lab 07/22/18 0753 07/22/18 2333 07/23/18 1919 07/25/18 0500 07/26/18 0525  WBC 4.0  --   --  5.6 6.8  HGB 8.7* 7.6* 8.9* 8.9* 9.2*  HCT 29.9* 26.0* 29.6* 29.8* 30.8*  MCV 72.9*  --   --  73.9* 74.0*  PLT 306  --   --  283 295   Basic Metabolic Panel: Recent Labs  Lab 07/22/18 0753 07/22/18 1643 07/23/18 0755 07/26/18 0525  NA 137  --  136 139  K 3.4*  --  4.3 4.0  CL 99  --  102 104  CO2 26  --  27 27  GLUCOSE 150*  --  111* 92  BUN 20  --  16 11  CREATININE 1.27*  --  0.98 0.88  CALCIUM 9.7  --  9.4 9.3  MG  --  2.2  --   --   PHOS  --  3.7  --   --    GFR: Estimated Creatinine Clearance: 73.5 mL/min (by C-G formula based on SCr of 0.88 mg/dL). Liver Function Tests: Recent Labs  Lab 07/23/18 0755  AST 19  ALT 12  ALKPHOS 79  BILITOT 0.6  PROT 6.6  ALBUMIN 3.5   No results for input(s): LIPASE, AMYLASE in the last 168 hours. No results for input(s): AMMONIA in the last 168 hours. Coagulation Profile: No results for input(s): INR, PROTIME in the last 168 hours. Cardiac Enzymes: Recent Labs  Lab 07/22/18 1643  TROPONINI <0.03   BNP (last 3 results) No results for input(s): PROBNP in the last 8760 hours. HbA1C: No results for input(s): HGBA1C in the last 72 hours. CBG: Recent Labs  Lab 07/22/18 0808 07/25/18 1139 07/26/18 0744 07/26/18 1129 07/26/18 1631  GLUCAP 128* 74 96 88 123*   Lipid Profile: No results for input(s): CHOL, HDL, LDLCALC, TRIG, CHOLHDL, LDLDIRECT in the last 72 hours. Thyroid Function Tests: No results for input(s): TSH, T4TOTAL, FREET4, T3FREE, THYROIDAB in the last 72 hours. Anemia Panel: No results for input(s): VITAMINB12, FOLATE, FERRITIN, TIBC, IRON, RETICCTPCT  in the last 72 hours. Sepsis Labs: No results for input(s): PROCALCITON, LATICACIDVEN in the last 168 hours.  No results found for this or any previous visit (from the past 240 hour(s)).   Radiology Studies: Mr Cardiac Morphology W Wo Contrast  Result Date: 07/25/2018 CLINICAL DATA:  52 year old female with recurrent syncopal episodes and decreased LVEF of unknown etiology. EXAM: CARDIAC MRI TECHNIQUE: The patient was scanned on a 1.5 Tesla GE magnet. A dedicated cardiac coil was used. Functional imaging was done using Fiesta sequences. 2,3, and 4 chamber views were done to assess for RWMA's. Modified Simpson's rule using a short axis stack was used to calculate an ejection fraction on a dedicated work [employer]. The patient received 10 cc of Gadovist. After 10 minutes inversion recovery sequences were used to assess for infiltration and scar tissue. CONTRAST:  10 cc of Gadovist FINDINGS: 1. Mildly dilated left ventricle with mild concentric hypertrophy and mildly to moderately decreased systolic function (LVEF = 40%). There is diffuse hypokinesis  and paradoxical septal motion. There is no late gadolinium enhancement in the left ventricular myocardium. LVEDD: 56 mm LVESD: 48 mm LVEDV: 170 ml LVESV: 102 ml SV: 68 ml CO: 5.2 L/min Myocardial mass: 150 g 2. Normal right ventricular size, thickness and systolic function (LVEF = 54%). There are no regional wall motion abnormalities. 3.  Normal left and right atrial size. 4. Normal size of the aortic root, ascending aorta and pulmonary artery. 5.  Trivial mitral and mild tricuspid regurgitation. 6.  Normal pericardium.  No pericardial effusion. IMPRESSION: 1. Mildly dilated left ventricle with mild concentric hypertrophy and mildly to moderately decreased systolic function (LVEF = 40%). There is diffuse hypokinesis and paradoxical septal motion. There is no late gadolinium enhancement in the left ventricular myocardium. 2. Normal right  ventricular size, thickness and systolic function (LVEF = 54%). There are no regional wall motion abnormalities. 3.  Normal left and right atrial size. 4. Normal size of the aortic root, ascending aorta and pulmonary artery. 5. Trivial mitral and mild tricuspid regurgitation. There is no evidence for infiltrative or inflammatory cardiomyopathy, nor for the arrhythmogenic right ventricular dysplasia. Findings consistent with non-ischemic dilated cardiomyopathy. Tobias Alexander Electronically Signed   By: Tobias Alexander   On: 07/25/2018 16:51    Scheduled Meds: . ARIPiprazole  10 mg Oral Daily  . aspirin EC  325 mg Oral Daily  . atorvastatin  10 mg Oral QHS  . carvedilol  6.25 mg Oral BID WC  . dicyclomine  10 mg Oral TID AC  . enoxaparin (LOVENOX) injection  40 mg Subcutaneous Q24H  . feeding supplement (ENSURE ENLIVE)  237 mL Oral BID BM  . ferrous sulfate  325 mg Oral TID WC  . Melatonin  3 mg Oral QHS  . mirtazapine  30 mg Oral QHS  . multivitamin with minerals  1 tablet Oral Daily  . sertraline  200 mg Oral Daily  . vitamin C  250 mg Oral TID WC   Continuous Infusions:   LOS: 3 days   Rickey Barbara, MD [facility] Pager On Amion  If 7PM-7AM, please contact night-coverage 07/26/2018, 5:08 PM
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
## 2018-07-26 NOTE — Care Management Note (Signed)
Case Management Note  Patient Details  Name: Julia Wheeler MRN: [id number] Date of Birth: [birth year]  Subjective/Objective:  MDD, Syncope                Action/Plan: Patient lives at home; PCP: Oneta Rack, NP; no medical insurance; Financial Counselor to screen the patient; CM will continue to follow for progression of care.  Expected Discharge Date:    possibly 07/28/2018              Expected Discharge Plan:  Home/Self Care  In-House Referral:   Financial Counselor  Discharge planning Services  CM Consult    Status of Service:  In process, will continue to follow  Reola Mosher [phone] 07/26/2018, 3:42 PM

## 2018-07-27 DIAGNOSIS — I5022 Chronic systolic (congestive) heart failure: Secondary | ICD-10-CM

## 2018-07-27 DIAGNOSIS — I10 Essential (primary) hypertension: Secondary | ICD-10-CM

## 2018-07-27 LAB — CBC
HCT: 31.7 % — ABNORMAL LOW (ref 36.0–46.0)
Hemoglobin: 9.4 g/dL — ABNORMAL LOW (ref 12.0–15.0)
MCH: 22.2 pg — ABNORMAL LOW (ref 26.0–34.0)
MCHC: 29.7 g/dL — ABNORMAL LOW (ref 30.0–36.0)
MCV: 74.8 fL — ABNORMAL LOW (ref 78.0–100.0)
Platelets: 286 10*3/uL (ref 150–400)
RBC: 4.24 MIL/uL (ref 3.87–5.11)
RDW: 22.1 % — AB (ref 11.5–15.5)
WBC: 5.5 10*3/uL (ref 4.0–10.5)

## 2018-07-27 LAB — FERRITIN: Ferritin: 12 ng/mL (ref 11–307)

## 2018-07-27 LAB — CORTISOL-AM, BLOOD: Cortisol - AM: 13.2 ug/dL (ref 6.7–22.6)

## 2018-07-27 MED ORDER — FERROUS SULFATE 325 (65 FE) MG PO TABS: 325.0000 mg | ORAL_TABLET | Freq: Every day | ORAL | 0 refills | Status: DC

## 2018-07-27 NOTE — Discharge Instructions (Signed)
Do not drive for at least 6 weeks

## 2018-07-27 NOTE — Progress Notes (Signed)
Contacted Case manager Ashely Goldean in regards to medical insurance and financial counselor since patient has D/C orders.  Per CM patient is ok to be d/c, someone is going to contact her after the weekend.  Gillian Meeuwsen, RN

## 2018-07-27 NOTE — Progress Notes (Signed)
Progress Note  Patient Name: Julia Wheeler Date of Encounter: 07/27/2018  Primary Cardiologist: Regan Lemming, MD   Subjective   No chest pain or shortness of breath  Inpatient Medications    Scheduled Meds: . ARIPiprazole  10 mg Oral Daily  . aspirin EC  325 mg Oral Daily  . atorvastatin  10 mg Oral QHS  . carvedilol  6.25 mg Oral BID WC  . dicyclomine  10 mg Oral TID AC  . enoxaparin (LOVENOX) injection  40 mg Subcutaneous Q24H  . feeding supplement (ENSURE ENLIVE)  237 mL Oral BID BM  . ferrous sulfate  325 mg Oral TID WC  . Melatonin  3 mg Oral QHS  . mirtazapine  30 mg Oral QHS  . multivitamin with minerals  1 tablet Oral Daily  . sertraline  200 mg Oral Daily  . vitamin C  250 mg Oral TID WC   Continuous Infusions:  PRN Meds: acetaminophen, loratadine, ondansetron (ZOFRAN) IV, pantoprazole, phenol, promethazine   Vital Signs    Vitals:   07/26/18 2003 07/27/18 0554 07/27/18 0934 07/27/18 0950  BP: 102/78 117/83 125/84 (!) 117/97  Pulse: 78 72 79 81  Resp:    20  Temp: 98.6 F (37 C) 97.8 F (36.6 C)  98.4 F (36.9 C)  TempSrc: Oral Oral  Oral  SpO2: 98% 98% 100% 100%  Weight:  70.6 kg    Height:        Intake/Output Summary (Last 24 hours) at 07/27/2018 1111 Last data filed at 07/27/2018 0939 Gross per 24 hour  Intake 837 ml  Output 500 ml  Net 337 ml   Filed Weights   07/25/18 0510 07/26/18 0500 07/27/18 0554  Weight: 70.7 kg 70.9 kg 70.6 kg    Telemetry    Normal sinus rhythm- Personally Reviewed  ECG    None- Personally Reviewed  Physical Exam   GEN: No acute distress.   Neck: No JVD Cardiac: RRR, no murmurs, rubs, or gallops.  Respiratory: Clear to auscultation bilaterally. GI: Soft, nontender, non-distended  MS: No edema; No deformity. Neuro:  Nonfocal  Psych: Normal affect   Labs    Chemistry Recent Labs  Lab 07/22/18 0753 07/23/18 0755 07/26/18 0525  NA 137 136 139  K 3.4* 4.3 4.0  CL 99 102 104    CO2 26 27 27   GLUCOSE 150* 111* 92  BUN 20 16 11   CREATININE 1.27* 0.98 0.88  CALCIUM 9.7 9.4 9.3  PROT  --  6.6  --   ALBUMIN  --  3.5  --   AST  --  19  --   ALT  --  12  --   ALKPHOS  --  79  --   BILITOT  --  0.6  --   GFRNONAA 48* >60 >60  GFRAA 56* >60 >60  ANIONGAP 12 7 8      Hematology Recent Labs  Lab 07/25/18 0500 07/26/18 0525 07/27/18 0608  WBC 5.6 6.8 5.5  RBC 4.03 4.16 4.24  HGB 8.9* 9.2* 9.4*  HCT 29.8* 30.8* 31.7*  MCV 73.9* 74.0* 74.8*  MCH 22.1* 22.1* 22.2*  MCHC 29.9* 29.9* 29.7*  RDW 21.3* 21.8* 22.1*  PLT 283 295 286    Cardiac Enzymes Recent Labs  Lab 07/22/18 1643  TROPONINI <0.03    Recent Labs  Lab 07/22/18 0813 07/22/18 1128 07/22/18 1345  TROPIPOC 0.02 0.00 0.02     BNPNo results for input(s): BNP, PROBNP in the  last 168 hours.   DDimer  Recent Labs  Lab 07/22/18 1341  DDIMER 0.83*     Radiology    No results found.  Cardiac Studies   None  Patient Profile     52 y.o. female admitted with syncope secondary to orthostasis  Assessment & Plan    1.  Syncope -her symptoms are fairly classic for orthostasis.  We discussed the warning signs, as well as medical changes as well as importance of salt and fluid intake.  She admits to missing meals and I have warned her against this.  I would recommend stopping carvedilol.  The importance of adequate hydration, avoidance of caffeine and alcohol, and frequent small-volume food intake was discussed.  We will sign off.  She can follow-up with Dr. Elberta Fortis. [facility] will sign off.   Medication Recommendations: Stop carvedilol Other recommendations (labs, testing, etc): None Follow up as an outpatient: Follow-up Dr. Elberta Fortis  For questions or updates, please contact [facility] Please consult [email or web address] for contact info under Cardiology/STEMI.      Signed, Lewayne Bunting, MD  07/27/2018, 11:11 AM  Patient ID: Julia Wheeler, female   DOB: [birth year], 52 y.o.    MRN: [id number]

## 2018-07-27 NOTE — Discharge Summary (Signed)
Physician Discharge Summary  Julia Wheeler HQI:[id number] DOB: 1965/12/18 DOA: 07/22/2018  PCP: Oneta Rack, NP  Admit date: 07/22/2018 Discharge date: 07/27/2018  Admitted From: Home Disposition:  Home  Recommendations for Outpatient Follow-up:  1. Follow up with PCP in 1-2 weeks 2. Follow up with Cardiology as scheduled  Discharge Condition:Stable CODE STATUS:Full Diet recommendation: Heart healthy   Brief/Interim Summary: 52 year old female with history of hypertension, hyperlipidemia, nephrolithiasis, nonischemic cardiomyopathy ,iron deficiency anemia, anemia, depression and posttraumatic stress disorder presented with an episode of syncope. He will history of major depression and recently lost 1 of her sibling. Patient was found to be significantly orthostatic in the ED. 2D echo showing EF of 45% the following.  Principal Problem: Syncope -Suspected orthostasis secondary to dehydration.  -Improved with IVF hydration.  Active Problems: Nausea and vomiting with left lower quadrant abdominal pain No abdominal distention or tenderness.  -Continued on  Tylenol for pain. Continued on dicyclomine with antiemetics. -UA reviewed, unremarkable  Nonischemic cardiomyopathy -2D echo with EF of 40-45% with mild diffuse hypokinesis.  -Cardiology recommended cardiac MRI. Findings of moderately decreased systolic function with diffuse hypokinesis and paradoxical septal motion. -No evidence of infiltrative or inflammatory cardiomyopathy -Cardiology has since recommended EP eval, consulted. Recommendations for outpatient follow up. Cardiology had recommended holding coreg on discharge -Pt aware of no driving for 6 weeks  Iron deficiency anemia -Reportedly patient on iron supplement but not adherent.  -s/p 1 unit PRBC transfusion with PO iron supplementation -Hemodynamically stable  Essential hypertension -BP stable at present -Stopping coreg at discharge per  Cardiology  MDD (major depressive disorder), recurrent, severe, with psychosis (HCC) Symptoms worsened with recent loss of her brother.  -Continue Zoloft. Denies suicidal ideation.  -Psych was consulted with recommendations for resuming her home dose remeron, trazodone,abilify and zoloft. Added nighttime melatonin for insomnia which has helped. Pt had received ECT in the past and recommended second course of ECT or TMS.  -Patient to follow up with Psychiatrist when discharged  Hypokalemia -replaced -labs reviewed  Acute kidney injury -Likely secondary to dehydration -Resolved. Labs reviewed  Generalized weakness -Labs reviewed. Noted to have normal TSH and B12.  Discharge Diagnoses:  Principal Problem:   MDD (major depressive disorder), recurrent episode, moderate (HCC) Active Problems:   Sickle cell trait (HCC)   Essential hypertension   Anemia, iron deficiency   MDD (major depressive disorder), recurrent, severe, with psychosis (HCC)   Chronic left systolic heart failure (HCC)   Gastric bypass status for obesity   Syncope   Orthostatic hypotension    Discharge Instructions   Allergies as of 07/27/2018      Reactions   Bee Venom Swelling   Swelling at the site    Lisinopril Swelling   Swelling of left side of face    Morphine And Related    Makes me crazy      Medication List    STOP taking these medications   carvedilol 6.25 MG tablet Commonly known as:  COREG   triamterene-hydrochlorothiazide 37.5-25 MG tablet Commonly known as:  MAXZIDE-25     TAKE these medications   ARIPiprazole 10 MG tablet Commonly known as:  ABILIFY Take 1 tablet (10 mg total) by mouth daily.   atorvastatin 10 MG tablet Commonly known as:  LIPITOR Take 1 tablet (10 mg total) by mouth at bedtime.   ferrous sulfate 325 (65 FE) MG tablet Take 1 tablet (325 mg total) by mouth daily with breakfast.   loratadine 10 MG tablet  Commonly known as:  CLARITIN Take 1 tablet  (10 mg total) by mouth daily. What changed:    when to take this  reasons to take this   pantoprazole 40 MG tablet Commonly known as:  PROTONIX Take 40 mg by mouth as needed (heartburn and/or acid reflux).   sertraline 100 MG tablet Commonly known as:  ZOLOFT Take 2 tablets (200 mg total) by mouth daily.       Allergies  Allergen Reactions  . Bee Venom Swelling    Swelling at the site   . Lisinopril Swelling    Swelling of left side of face   . Morphine And Related     Makes me crazy    Consultations:  Cardiology  EP  Procedures/Studies: Dg Chest 2 View  Result Date: 07/22/2018 CLINICAL DATA:  Syncope. EXAM: CHEST - 2 VIEW COMPARISON:  Radiographs of Apr 01, 2018. FINDINGS: The heart size and mediastinal contours are within normal limits. Both lungs are clear. No pneumothorax or pleural effusion is noted. The visualized skeletal structures are unremarkable. IMPRESSION: No active cardiopulmonary disease. Electronically Signed   By: Lupita Raider, M.D.   On: 07/22/2018 08:53   Nm Pulmonary Perf And Vent  Result Date: 07/22/2018 CLINICAL DATA:  Syncope.  Chest heaviness, palpitations. EXAM: NUCLEAR MEDICINE VENTILATION - PERFUSION LUNG SCAN TECHNIQUE: Ventilation images were obtained in multiple projections using inhaled aerosol Tc-22m DTPA. Perfusion images were obtained in multiple projections after intravenous injection of Tc-70m-MAA. RADIOPHARMACEUTICALS:  31.5 mCi of Tc-24m DTPA aerosol inhalation and 4.3 mCi Tc23m-MAA IV COMPARISON:  Chest x-ray 07/22/2018 FINDINGS: Ventilation: No focal ventilation defect. Perfusion: No wedge shaped peripheral perfusion defects to suggest acute pulmonary embolism. IMPRESSION: Normal VQ scan.  No evidence of pulmonary embolus. Electronically Signed   By: Charlett Nose M.D.   On: 07/22/2018 18:14   Mr Cardiac Morphology W Wo Contrast  Result Date: 07/25/2018 CLINICAL DATA:  52 year old female with recurrent syncopal episodes and  decreased LVEF of unknown etiology. EXAM: CARDIAC MRI TECHNIQUE: The patient was scanned on a 1.5 Tesla GE magnet. A dedicated cardiac coil was used. Functional imaging was done using Fiesta sequences. 2,3, and 4 chamber views were done to assess for RWMA's. Modified Simpson's rule using a short axis stack was used to calculate an ejection fraction on a dedicated work [employer]. The patient received 10 cc of Gadovist. After 10 minutes inversion recovery sequences were used to assess for infiltration and scar tissue. CONTRAST:  10 cc of Gadovist FINDINGS: 1. Mildly dilated left ventricle with mild concentric hypertrophy and mildly to moderately decreased systolic function (LVEF = 40%). There is diffuse hypokinesis and paradoxical septal motion. There is no late gadolinium enhancement in the left ventricular myocardium. LVEDD: 56 mm LVESD: 48 mm LVEDV: 170 ml LVESV: 102 ml SV: 68 ml CO: 5.2 L/min Myocardial mass: 150 g 2. Normal right ventricular size, thickness and systolic function (LVEF = 54%). There are no regional wall motion abnormalities. 3.  Normal left and right atrial size. 4. Normal size of the aortic root, ascending aorta and pulmonary artery. 5.  Trivial mitral and mild tricuspid regurgitation. 6.  Normal pericardium.  No pericardial effusion. IMPRESSION: 1. Mildly dilated left ventricle with mild concentric hypertrophy and mildly to moderately decreased systolic function (LVEF = 40%). There is diffuse hypokinesis and paradoxical septal motion. There is no late gadolinium enhancement in the left ventricular myocardium. 2. Normal right ventricular size, thickness and systolic function (LVEF = 54%). There  are no regional wall motion abnormalities. 3.  Normal left and right atrial size. 4. Normal size of the aortic root, ascending aorta and pulmonary artery. 5. Trivial mitral and mild tricuspid regurgitation. There is no evidence for infiltrative or inflammatory cardiomyopathy, nor for  the arrhythmogenic right ventricular dysplasia. Findings consistent with non-ischemic dilated cardiomyopathy. Tobias Alexander Electronically Signed   By: Tobias Alexander   On: 07/25/2018 16:51    Subjective: Without complaints this AM  Discharge Exam: Vitals:   07/27/18 0934 07/27/18 0950  BP: 125/84 (!) 117/97  Pulse: 79 81  Resp:  20  Temp:  98.4 F (36.9 C)  SpO2: 100% 100%   Vitals:   07/26/18 2003 07/27/18 0554 07/27/18 0934 07/27/18 0950  BP: 102/78 117/83 125/84 (!) 117/97  Pulse: 78 72 79 81  Resp:    20  Temp: 98.6 F (37 C) 97.8 F (36.6 C)  98.4 F (36.9 C)  TempSrc: Oral Oral  Oral  SpO2: 98% 98% 100% 100%  Weight:  70.6 kg    Height:        General: Pt is alert, awake, not in acute distress Cardiovascular: RRR, S1/S2 +, no rubs, no gallops Respiratory: CTA bilaterally, no wheezing, no rhonchi Abdominal: Soft, NT, ND, bowel sounds + Extremities: no edema, no cyanosis   The results of significant diagnostics from this hospitalization (including imaging, microbiology, ancillary and laboratory) are listed below for reference.     Microbiology: No results found for this or any previous visit (from the past 240 hour(s)).   Labs: BNP (last 3 results) Recent Labs    11/11/17 0320 04/01/18 1408  BNP 18.3 26.5   Basic Metabolic Panel: Recent Labs  Lab 07/22/18 0753 07/22/18 1643 07/23/18 0755 07/26/18 0525  NA 137  --  136 139  K 3.4*  --  4.3 4.0  CL 99  --  102 104  CO2 26  --  27 27  GLUCOSE 150*  --  111* 92  BUN 20  --  16 11  CREATININE 1.27*  --  0.98 0.88  CALCIUM 9.7  --  9.4 9.3  MG  --  2.2  --   --   PHOS  --  3.7  --   --    Liver Function Tests: Recent Labs  Lab 07/23/18 0755  AST 19  ALT 12  ALKPHOS 79  BILITOT 0.6  PROT 6.6  ALBUMIN 3.5   No results for input(s): LIPASE, AMYLASE in the last 168 hours. No results for input(s): AMMONIA in the last 168 hours. CBC: Recent Labs  Lab 07/22/18 0753 07/22/18 2333  07/23/18 1919 07/25/18 0500 07/26/18 0525 07/27/18 0608  WBC 4.0  --   --  5.6 6.8 5.5  HGB 8.7* 7.6* 8.9* 8.9* 9.2* 9.4*  HCT 29.9* 26.0* 29.6* 29.8* 30.8* 31.7*  MCV 72.9*  --   --  73.9* 74.0* 74.8*  PLT 306  --   --  283 295 286   Cardiac Enzymes: Recent Labs  Lab 07/22/18 1643  TROPONINI <0.03   BNP: Invalid input(s): POCBNP CBG: Recent Labs  Lab 07/22/18 0808 07/25/18 1139 07/26/18 0744 07/26/18 1129 07/26/18 1631  GLUCAP 128* 74 96 88 123*   D-Dimer No results for input(s): DDIMER in the last 72 hours. Hgb A1c No results for input(s): HGBA1C in the last 72 hours. Lipid Profile No results for input(s): CHOL, HDL, LDLCALC, TRIG, CHOLHDL, LDLDIRECT in the last 72 hours. Thyroid function studies No results for  input(s): TSH, T4TOTAL, T3FREE, THYROIDAB in the last 72 hours.  Invalid input(s): FREET3 Anemia work up Recent Labs    07/27/18 0608  FERRITIN 12   Urinalysis    Component Value Date/Time   COLORURINE STRAW (A) 07/24/2018 1810   APPEARANCEUR CLEAR 07/24/2018 1810   LABSPEC 1.003 (L) 07/24/2018 1810   PHURINE 6.0 07/24/2018 1810   GLUCOSEU NEGATIVE 07/24/2018 1810   HGBUR NEGATIVE 07/24/2018 1810   BILIRUBINUR NEGATIVE 07/24/2018 1810   KETONESUR NEGATIVE 07/24/2018 1810   PROTEINUR NEGATIVE 07/24/2018 1810   UROBILINOGEN 1.0 12/27/2014 0600   NITRITE NEGATIVE 07/24/2018 1810   LEUKOCYTESUR NEGATIVE 07/24/2018 1810   Sepsis Labs Invalid input(s): PROCALCITONIN,  WBC,  LACTICIDVEN Microbiology No results found for this or any previous visit (from the past 240 hour(s)).  Time spent: 30 min  SIGNED:   Rickey Barbara, MD  [facility] 07/27/2018, 11:52 AM  If 7PM-7AM, please contact night-coverage

## 2018-07-27 NOTE — Progress Notes (Signed)
Patient discharged: Home  Via: Wheelchair   Discharge paperwork given: to patient and family  Reviewed with teach back, Patient is to follow up with cardiology and PCP  IV and telemetry disconnected  Belongings given to patient  Prescription and out of work letter provide.  Patient escorted.   Erling Arrazola, RN

## 2018-07-29 ENCOUNTER — Telehealth: Payer: Self-pay | Admitting: Cardiology

## 2018-07-29 NOTE — Telephone Encounter (Signed)
Pt will need OV to discuss.  She was last seen by [facility] on 03/2017.  Please call pt and arrange.  Thx

## 2018-07-29 NOTE — Telephone Encounter (Signed)
Pt called about getting a loop discussed at hospital per pt. I don't see this documentation, so asking if she needs ov to discuss or can she just get scheduled? Pls advise

## 2018-07-31 NOTE — Telephone Encounter (Signed)
Follow up   Patient is returning call. I did see that she would need an appointment. An appointment was made for 10/24. If that is all that is needed she states no call back is needed. If something additional is needed please call.

## 2018-08-01 ENCOUNTER — Telehealth: Payer: Self-pay

## 2018-08-01 NOTE — Telephone Encounter (Signed)
-----   Message from Duke Salvia, MD sent at 07/26/2018  8:25 PM EDT ----- Maryln Manuel, can you call this lady next week and arrange for a loop recorder.  Sumy , she has a family history of cardiomyopathy.  I am not sure how we arrange her to see you but she is agreeable.

## 2018-08-01 NOTE — Telephone Encounter (Signed)
Pt scheduled to have Linq implanted on 9/25 with Dr Graciela Husbands. Pt had questions as to activity level and work restrictions. 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 I advised her to take it easy, stay moderately hydrated since she works throughout the night. Take in frequent small meals and be sure she has an emergency contact she can call if she does not feel well while at work. Pt aggred to see Dr Jomarie Longs for genetic counseling. Pt understands scheduling will be contacting her to set this up.  Pt has been given date, time, and instructions for Linq impant. She has verbalized understanding and had no additional questions.

## 2018-08-07 ENCOUNTER — Encounter (HOSPITAL_COMMUNITY): Payer: Self-pay | Admitting: Internal Medicine

## 2018-08-07 ENCOUNTER — Ambulatory Visit (HOSPITAL_COMMUNITY)
Admission: RE | Admit: 2018-08-07 | Discharge: 2018-08-07 | Disposition: A | Discharge status: Home / Self Care | Payer: Medicaid Other | Source: Ambulatory Visit | Attending: Internal Medicine | Admitting: Internal Medicine

## 2018-08-07 ENCOUNTER — Encounter (HOSPITAL_COMMUNITY)
Admission: RE | Disposition: A | Discharge status: Home / Self Care | Payer: Self-pay | Source: Ambulatory Visit | Attending: Internal Medicine

## 2018-08-07 DIAGNOSIS — F4310 Post-traumatic stress disorder, unspecified: Secondary | ICD-10-CM | POA: Insufficient documentation

## 2018-08-07 DIAGNOSIS — Z87442 Personal history of urinary calculi: Secondary | ICD-10-CM | POA: Insufficient documentation

## 2018-08-07 DIAGNOSIS — R55 Syncope and collapse: Secondary | ICD-10-CM

## 2018-08-07 DIAGNOSIS — I509 Heart failure, unspecified: Secondary | ICD-10-CM | POA: Insufficient documentation

## 2018-08-07 DIAGNOSIS — Z823 Family history of stroke: Secondary | ICD-10-CM | POA: Insufficient documentation

## 2018-08-07 DIAGNOSIS — Z885 Allergy status to narcotic agent status: Secondary | ICD-10-CM | POA: Insufficient documentation

## 2018-08-07 DIAGNOSIS — Z9889 Other specified postprocedural states: Secondary | ICD-10-CM | POA: Insufficient documentation

## 2018-08-07 DIAGNOSIS — Z8249 Family history of ischemic heart disease and other diseases of the circulatory system: Secondary | ICD-10-CM | POA: Insufficient documentation

## 2018-08-07 DIAGNOSIS — Z818 Family history of other mental and behavioral disorders: Secondary | ICD-10-CM | POA: Insufficient documentation

## 2018-08-07 DIAGNOSIS — K219 Gastro-esophageal reflux disease without esophagitis: Secondary | ICD-10-CM | POA: Insufficient documentation

## 2018-08-07 DIAGNOSIS — D573 Sickle-cell trait: Secondary | ICD-10-CM | POA: Insufficient documentation

## 2018-08-07 DIAGNOSIS — I110 Hypertensive heart disease with heart failure: Secondary | ICD-10-CM | POA: Insufficient documentation

## 2018-08-07 DIAGNOSIS — I429 Cardiomyopathy, unspecified: Secondary | ICD-10-CM | POA: Insufficient documentation

## 2018-08-07 DIAGNOSIS — Z79899 Other long term (current) drug therapy: Secondary | ICD-10-CM | POA: Insufficient documentation

## 2018-08-07 DIAGNOSIS — D509 Iron deficiency anemia, unspecified: Secondary | ICD-10-CM | POA: Insufficient documentation

## 2018-08-07 DIAGNOSIS — Z9851 Tubal ligation status: Secondary | ICD-10-CM | POA: Insufficient documentation

## 2018-08-07 DIAGNOSIS — E785 Hyperlipidemia, unspecified: Secondary | ICD-10-CM | POA: Insufficient documentation

## 2018-08-07 DIAGNOSIS — F329 Major depressive disorder, single episode, unspecified: Secondary | ICD-10-CM | POA: Insufficient documentation

## 2018-08-07 DIAGNOSIS — Z888 Allergy status to other drugs, medicaments and biological substances status: Secondary | ICD-10-CM | POA: Insufficient documentation

## 2018-08-07 DIAGNOSIS — D571 Sickle-cell disease without crisis: Secondary | ICD-10-CM | POA: Insufficient documentation

## 2018-08-07 DIAGNOSIS — Z9884 Bariatric surgery status: Secondary | ICD-10-CM | POA: Insufficient documentation

## 2018-08-07 DIAGNOSIS — Z8719 Personal history of other diseases of the digestive system: Secondary | ICD-10-CM | POA: Insufficient documentation

## 2018-08-07 DIAGNOSIS — M1990 Unspecified osteoarthritis, unspecified site: Secondary | ICD-10-CM | POA: Insufficient documentation

## 2018-08-07 DIAGNOSIS — Z91030 Bee allergy status: Secondary | ICD-10-CM | POA: Insufficient documentation

## 2018-08-07 HISTORY — PX: LOOP RECORDER INSERTION: EP1214

## 2018-08-07 SURGERY — LOOP RECORDER INSERTION

## 2018-08-07 MED ORDER — LIDOCAINE-EPINEPHRINE 1 %-1:100000 IJ SOLN
INTRAMUSCULAR | Status: AC
  Filled 2018-08-07: qty 1

## 2018-08-07 MED ORDER — LIDOCAINE-EPINEPHRINE 1 %-1:100000 IJ SOLN
INTRAMUSCULAR | Status: DC | PRN
  Administered 2018-08-07: 20 mL

## 2018-08-07 SURGICAL SUPPLY — 2 items
LOOP REVEAL LINQSYS (Prosthesis & Implant Heart) ×2 IMPLANT
PACK LOOP INSERTION (CUSTOM PROCEDURE TRAY) ×2 IMPLANT

## 2018-08-07 NOTE — Interval H&P Note (Signed)
History and Physical Interval Note:  08/07/2018 8:44 AM  Julia Wheeler  has presented today for surgery, with the diagnosis of syncope  The various methods of treatment have been discussed with the patient and family. After consideration of risks, benefits and other options for treatment, the patient has consented to  Procedure(s): LOOP RECORDER INSERTION (N/A) as a surgical intervention .  The patient's history has been reviewed, patient examined, no change in status, stable for surgery.  I have reviewed the patient's chart and labs.  Questions were answered to the patient's satisfaction.     Sherryl Manges  No changes

## 2018-08-07 NOTE — Discharge Instructions (Signed)
Implantable Loop Recorder Placement, Care After Refer to this sheet in the next few weeks. These instructions provide you with information about caring for yourself after your procedure. Your health care provider may also give you more specific instructions. Your treatment has been planned according to current medical practices, but problems sometimes occur. Call your health care provider if you have any problems or questions after your procedure. What can I expect after the procedure? After the procedure, it is common to have:  Soreness or pain near the cut from surgery (incision).  Some swelling or bruising near the incision.  Follow these instructions at home: Medicines  Take over-the-counter and prescription medicines only as told by your health care provider.  If you were prescribed an antibiotic medicine, take it as told by your health care provider. Do not stop taking the antibiotic even if you start to feel better. Bathing  Do not take baths, swim, or use a hot tub for 1 week. You can take showers after 24 hours.  Incision care  Follow instructions from your health care provider about how to take care of your incision. Make sure you: ? Wash your hands with soap and water before you change your bandage (dressing). If soap and water are not available, use hand sanitizer. ? Change your dressing as told by your health care provider. ? Keep your dressing dry. ? Leave stitches (sutures), skin glue, or adhesive strips in place. These skin closures may need to stay in place for 2 weeks or longer. If adhesive strip edges start to loosen and curl up, you may trim the loose edges. Do not remove adhesive strips completely unless your health care provider tells you to do that.  Check your incision area every day for signs of infection. Check for: ? More redness, swelling, or pain. ? Fluid or blood. ? Warmth. ? Pus or a bad smell. Activity  Return to your normal activities as told by your  health care provider. Ask your health care provider what activities are safe for you.  Until your health care provider says it is safe: ? Do not lift anything that is heavier than 10 lb (4.5 kg). ? Do not do activities that involve lifting your arms over your head. General instructions   Follow instructions from your health care provider about how and when to use your implantable loop recorder.  Do not go through a metal detection gate, and do not let someone hold a metal detector over your chest. Show your ID card.  Do not have an MRI unless you check with your health care provider first.  Do not use any tobacco products, such as cigarettes, chewing tobacco, and e-cigarettes. Tobacco can delay healing. If you need help quitting, ask your health care provider.  Keep all follow-up visits as told by your health care provider. This is important. Contact a health care provider if:  You have more redness, swelling, or pain around your incision.  You have more fluid or blood coming from your incision.  Your incision feels warm to the touch.  You have pus or a bad smell coming from your incision.  You have a fever.  You have pain that is not relieved by your pain medicine.  You have triggered your device because of fainting (syncope) or because of a heartbeat that feels like it is racing, slow, fluttering, or skipping (palpitations). Get help right away if:  You have chest pain.  You have difficulty breathing. This information is not  intended to replace advice given to you by your health care provider. Make sure you discuss any questions you have with your health care provider. Document Released: 10/11/2015 Document Revised: 04/06/2016 Document Reviewed: 08/04/2015 Elsevier Interactive Patient Education  [employer].

## 2018-08-12 ENCOUNTER — Ambulatory Visit: Payer: Self-pay | Admitting: Cardiology

## 2018-08-19 ENCOUNTER — Ambulatory Visit (INDEPENDENT_AMBULATORY_CARE_PROVIDER_SITE_OTHER): Payer: Self-pay | Admitting: *Deleted

## 2018-08-19 DIAGNOSIS — R55 Syncope and collapse: Secondary | ICD-10-CM

## 2018-08-19 LAB — CUP PACEART INCLINIC DEVICE CHECK
Implantable Pulse Generator Implant Date: 20190925
MDC IDC SESS DTM: 20191007092926

## 2018-08-19 NOTE — Progress Notes (Signed)
LINQ wound check in clinic. Tegaderm and steri-strips removed. Incision edges approximated without redness, swelling or drainage. Patient educated about wound care.  Battery-good. R waves 0.67mV. No episodes. Carelink monitor connected. Monthly summary reports, ROV with SK 11/22/18.

## 2018-08-21 ENCOUNTER — Telehealth: Payer: Self-pay | Admitting: Internal Medicine

## 2018-08-21 NOTE — Telephone Encounter (Signed)
New Message        Patient is calling today because she is getting very tired all the time. Patient also states she is nausea as well.

## 2018-08-21 NOTE — Telephone Encounter (Signed)
Spoke to patient who called to inform us that she is lethargic and weak.  She is also nauseated and vomiting.  She is otherwise asymptomatic without SOB or CP.  I recommended that she contact her PCP.  If she is advised to call us by her PCP, she may do so.

## 2018-08-22 ENCOUNTER — Encounter: Payer: Self-pay | Admitting: Genetic Counselor

## 2018-08-26 ENCOUNTER — Encounter (HOSPITAL_COMMUNITY): Payer: Self-pay | Admitting: *Deleted

## 2018-08-26 ENCOUNTER — Emergency Department (HOSPITAL_COMMUNITY)
Admission: EM | Admit: 2018-08-26 | Discharge: 2018-08-27 | Disposition: A | Discharge status: Other Acute Inpatient Hospital | Payer: Medicaid Other | Attending: Emergency Medicine | Admitting: Emergency Medicine

## 2018-08-26 DIAGNOSIS — F329 Major depressive disorder, single episode, unspecified: Secondary | ICD-10-CM

## 2018-08-26 DIAGNOSIS — F32A Depression, unspecified: Secondary | ICD-10-CM

## 2018-08-26 DIAGNOSIS — I110 Hypertensive heart disease with heart failure: Secondary | ICD-10-CM | POA: Insufficient documentation

## 2018-08-26 DIAGNOSIS — Z79899 Other long term (current) drug therapy: Secondary | ICD-10-CM | POA: Insufficient documentation

## 2018-08-26 DIAGNOSIS — F332 Major depressive disorder, recurrent severe without psychotic features: Secondary | ICD-10-CM | POA: Insufficient documentation

## 2018-08-26 DIAGNOSIS — I509 Heart failure, unspecified: Secondary | ICD-10-CM | POA: Insufficient documentation

## 2018-08-26 LAB — RAPID URINE DRUG SCREEN, HOSP PERFORMED
AMPHETAMINES: NOT DETECTED
BENZODIAZEPINES: NOT DETECTED
Barbiturates: NOT DETECTED
Cocaine: NOT DETECTED
OPIATES: NOT DETECTED
TETRAHYDROCANNABINOL: NOT DETECTED

## 2018-08-26 LAB — I-STAT BETA HCG BLOOD, ED (MC, WL, AP ONLY): I-stat hCG, quantitative: <5 m[IU]/mL (ref <5)

## 2018-08-26 NOTE — ED Triage Notes (Signed)
Pt in c/o increased anxiety at home, has not taken her medication in the last week for this, feels like she is going to have a nervous breakdown

## 2018-08-26 NOTE — ED Notes (Signed)
Pt asked for urine sample and Pt stated "I can't go right now I need some water first." Pt give cup of water and I also showed her where the bathroom is.

## 2018-08-26 NOTE — ED Notes (Signed)
Pt changed into burgundy scrubs and security called to come and wand pt.

## 2018-08-26 NOTE — ED Notes (Signed)
Security at bedside
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
## 2018-08-26 NOTE — ED Triage Notes (Signed)
Denies SI/HI

## 2018-08-26 NOTE — ED Notes (Signed)
TTS at bedside. 
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
## 2018-08-26 NOTE — ED Notes (Signed)
Tried getting blood from left AC and wrist, both unsuccessful x2.

## 2018-08-26 NOTE — Progress Notes (Signed)
Pt meets inpatient criteria per Nira Conn, NP. Referral information has been sent to the following hospitals for review:  [facility]  [facility]  [facility]  [facility]  [facility]  [facility]   Disposition CSWs will continue to assist with placement needs.  Wells Guiles, LCSW, LCAS Disposition CSW [facility]/TTS [phone]

## 2018-08-27 ENCOUNTER — Inpatient Hospital Stay (HOSPITAL_COMMUNITY)
Admission: AD | Admit: 2018-08-27 | Discharge: 2018-08-29 | DRG: 885 | Disposition: A | Discharge status: Home / Self Care | Payer: Federal, State, Local not specified - Other | Source: Intra-hospital | Attending: Psychiatry | Admitting: Psychiatry

## 2018-08-27 ENCOUNTER — Other Ambulatory Visit: Payer: Self-pay

## 2018-08-27 ENCOUNTER — Encounter (HOSPITAL_COMMUNITY): Payer: Self-pay

## 2018-08-27 DIAGNOSIS — F4310 Post-traumatic stress disorder, unspecified: Secondary | ICD-10-CM | POA: Diagnosis present

## 2018-08-27 DIAGNOSIS — I428 Other cardiomyopathies: Secondary | ICD-10-CM | POA: Diagnosis present

## 2018-08-27 DIAGNOSIS — F333 Major depressive disorder, recurrent, severe with psychotic symptoms: Secondary | ICD-10-CM | POA: Diagnosis present

## 2018-08-27 DIAGNOSIS — R45851 Suicidal ideations: Secondary | ICD-10-CM | POA: Diagnosis present

## 2018-08-27 DIAGNOSIS — D509 Iron deficiency anemia, unspecified: Secondary | ICD-10-CM | POA: Diagnosis present

## 2018-08-27 DIAGNOSIS — Z818 Family history of other mental and behavioral disorders: Secondary | ICD-10-CM | POA: Diagnosis not present

## 2018-08-27 DIAGNOSIS — Z888 Allergy status to other drugs, medicaments and biological substances status: Secondary | ICD-10-CM

## 2018-08-27 DIAGNOSIS — R0609 Other forms of dyspnea: Secondary | ICD-10-CM | POA: Diagnosis present

## 2018-08-27 DIAGNOSIS — I5042 Chronic combined systolic (congestive) and diastolic (congestive) heart failure: Secondary | ICD-10-CM | POA: Diagnosis present

## 2018-08-27 DIAGNOSIS — I10 Essential (primary) hypertension: Secondary | ICD-10-CM

## 2018-08-27 DIAGNOSIS — G8929 Other chronic pain: Secondary | ICD-10-CM | POA: Diagnosis present

## 2018-08-27 DIAGNOSIS — I110 Hypertensive heart disease with heart failure: Secondary | ICD-10-CM | POA: Diagnosis present

## 2018-08-27 DIAGNOSIS — F332 Major depressive disorder, recurrent severe without psychotic features: Secondary | ICD-10-CM | POA: Diagnosis not present

## 2018-08-27 DIAGNOSIS — E785 Hyperlipidemia, unspecified: Secondary | ICD-10-CM | POA: Diagnosis present

## 2018-08-27 DIAGNOSIS — K219 Gastro-esophageal reflux disease without esophagitis: Secondary | ICD-10-CM | POA: Diagnosis present

## 2018-08-27 DIAGNOSIS — Z91030 Bee allergy status: Secondary | ICD-10-CM

## 2018-08-27 DIAGNOSIS — Z79899 Other long term (current) drug therapy: Secondary | ICD-10-CM

## 2018-08-27 DIAGNOSIS — Z915 Personal history of self-harm: Secondary | ICD-10-CM

## 2018-08-27 DIAGNOSIS — R0600 Dyspnea, unspecified: Secondary | ICD-10-CM

## 2018-08-27 DIAGNOSIS — D573 Sickle-cell trait: Secondary | ICD-10-CM | POA: Diagnosis present

## 2018-08-27 DIAGNOSIS — G4700 Insomnia, unspecified: Secondary | ICD-10-CM | POA: Diagnosis present

## 2018-08-27 DIAGNOSIS — Z885 Allergy status to narcotic agent status: Secondary | ICD-10-CM | POA: Diagnosis not present

## 2018-08-27 DIAGNOSIS — Z9884 Bariatric surgery status: Secondary | ICD-10-CM

## 2018-08-27 LAB — CBC
HCT: 32.2 % — ABNORMAL LOW (ref 36.0–46.0)
Hemoglobin: 9.8 g/dL — ABNORMAL LOW (ref 12.0–15.0)
MCH: 22.3 pg — ABNORMAL LOW (ref 26.0–34.0)
MCHC: 30.4 g/dL (ref 30.0–36.0)
MCV: 73.3 fL — AB (ref 80.0–100.0)
NRBC: 0 % (ref 0.0–0.2)
PLATELETS: 223 10*3/uL (ref 150–400)
RBC: 4.39 MIL/uL (ref 3.87–5.11)
RDW: 21.7 % — AB (ref 11.5–15.5)
WBC: 5.7 10*3/uL (ref 4.0–10.5)

## 2018-08-27 LAB — COMPREHENSIVE METABOLIC PANEL
ALT: 17 U/L (ref 0–44)
AST: 31 U/L (ref 15–41)
Albumin: 4.1 g/dL (ref 3.5–5.0)
Alkaline Phosphatase: 97 U/L (ref 38–126)
Anion gap: 11 (ref 5–15)
BUN: 12 mg/dL (ref 6–20)
CALCIUM: 9.9 mg/dL (ref 8.9–10.3)
CHLORIDE: 100 mmol/L (ref 98–111)
CO2: 27 mmol/L (ref 22–32)
Creatinine, Ser: 0.88 mg/dL (ref 0.44–1.00)
GFR calc non Af Amer: >60 mL/min (ref >60)
Glucose, Bld: 91 mg/dL (ref 70–99)
POTASSIUM: 4.1 mmol/L (ref 3.5–5.1)
SODIUM: 138 mmol/L (ref 135–145)
TOTAL PROTEIN: 7.5 g/dL (ref 6.5–8.1)
Total Bilirubin: 0.8 mg/dL (ref 0.3–1.2)

## 2018-08-27 LAB — ETHANOL

## 2018-08-27 MED ORDER — ARIPIPRAZOLE 2 MG PO TABS
2.0000 mg | ORAL_TABLET | Freq: Every day | ORAL | Status: DC
  Filled 2018-08-27: qty 1

## 2018-08-27 MED ORDER — NICOTINE 21 MG/24HR TD PT24: 21.0000 mg | MEDICATED_PATCH | Freq: Every day | TRANSDERMAL | Status: DC

## 2018-08-27 MED ORDER — LORAZEPAM 2 MG/ML IJ SOLN: 0.0000 mg | Freq: Two times a day (BID) | INTRAMUSCULAR | Status: DC

## 2018-08-27 MED ORDER — LORAZEPAM 2 MG/ML IJ SOLN: 0.0000 mg | Freq: Four times a day (QID) | INTRAMUSCULAR | Status: DC

## 2018-08-27 MED ORDER — ATORVASTATIN CALCIUM 10 MG PO TABS
10.0000 mg | ORAL_TABLET | Freq: Every day | ORAL | Status: DC
  Administered 2018-08-27 – 2018-08-28 (×2): 10 mg via ORAL
  Filled 2018-08-27 (×4): qty 1

## 2018-08-27 MED ORDER — ZOLPIDEM TARTRATE 5 MG PO TABS
5.0000 mg | ORAL_TABLET | Freq: Every evening | ORAL | Status: DC | PRN
  Administered 2018-08-27: 5 mg via ORAL
  Filled 2018-08-27: qty 1

## 2018-08-27 MED ORDER — ACETAMINOPHEN 325 MG PO TABS
ORAL_TABLET | ORAL | Status: AC
  Filled 2018-08-27: qty 2

## 2018-08-27 MED ORDER — ACETAMINOPHEN 325 MG PO TABS
650.0000 mg | ORAL_TABLET | ORAL | Status: DC | PRN
  Administered 2018-08-27: 650 mg via ORAL
  Filled 2018-08-27: qty 2

## 2018-08-27 MED ORDER — ARIPIPRAZOLE 10 MG PO TABS
10.0000 mg | ORAL_TABLET | Freq: Every day | ORAL | Status: DC
  Administered 2018-08-27: 10 mg via ORAL
  Filled 2018-08-27: qty 1

## 2018-08-27 MED ORDER — ADULT MULTIVITAMIN W/MINERALS CH
1.0000 | ORAL_TABLET | Freq: Every day | ORAL | Status: DC
  Administered 2018-08-28 – 2018-08-29 (×2): 1 via ORAL
  Filled 2018-08-27 (×6): qty 1

## 2018-08-27 MED ORDER — LORAZEPAM 1 MG PO TABS
1.0000 mg | ORAL_TABLET | Freq: Four times a day (QID) | ORAL | Status: DC | PRN
  Administered 2018-08-28 (×2): 1 mg via ORAL
  Filled 2018-08-27 (×3): qty 1

## 2018-08-27 MED ORDER — LORATADINE 10 MG PO TABS: 10.0000 mg | ORAL_TABLET | Freq: Every day | ORAL | Status: DC | PRN

## 2018-08-27 MED ORDER — THIAMINE HCL 100 MG/ML IJ SOLN: 100.0000 mg | Freq: Every day | INTRAMUSCULAR | Status: DC

## 2018-08-27 MED ORDER — FERROUS SULFATE 325 (65 FE) MG PO TABS
325.0000 mg | ORAL_TABLET | Freq: Every day | ORAL | Status: DC
  Administered 2018-08-27: 325 mg via ORAL
  Filled 2018-08-27: qty 1

## 2018-08-27 MED ORDER — ENSURE ENLIVE PO LIQD
237.0000 mL | Freq: Two times a day (BID) | ORAL | Status: DC
  Administered 2018-08-28 – 2018-08-29 (×3): 237 mL via ORAL

## 2018-08-27 MED ORDER — ONDANSETRON HCL 4 MG PO TABS: 4.0000 mg | ORAL_TABLET | Freq: Three times a day (TID) | ORAL | Status: DC | PRN

## 2018-08-27 MED ORDER — LORAZEPAM 1 MG PO TABS: 0.0000 mg | ORAL_TABLET | Freq: Two times a day (BID) | ORAL | Status: DC

## 2018-08-27 MED ORDER — LORAZEPAM 1 MG PO TABS: 0.0000 mg | ORAL_TABLET | Freq: Four times a day (QID) | ORAL | Status: DC

## 2018-08-27 MED ORDER — METOPROLOL TARTRATE 12.5 MG HALF TABLET
12.5000 mg | ORAL_TABLET | Freq: Two times a day (BID) | ORAL | Status: DC
  Administered 2018-08-28 – 2018-08-29 (×3): 12.5 mg via ORAL
  Filled 2018-08-27 (×7): qty 1

## 2018-08-27 MED ORDER — ACETAMINOPHEN 325 MG PO TABS
650.0000 mg | ORAL_TABLET | Freq: Four times a day (QID) | ORAL | Status: DC | PRN
  Administered 2018-08-27 – 2018-08-29 (×2): 650 mg via ORAL
  Filled 2018-08-27: qty 2

## 2018-08-27 MED ORDER — PANTOPRAZOLE SODIUM 40 MG PO TBEC: 40.0000 mg | DELAYED_RELEASE_TABLET | ORAL | Status: DC | PRN

## 2018-08-27 MED ORDER — LORAZEPAM 1 MG PO TABS
1.0000 mg | ORAL_TABLET | Freq: Once | ORAL | Status: AC
  Administered 2018-08-27: 1 mg via ORAL

## 2018-08-27 MED ORDER — ALUM & MAG HYDROXIDE-SIMETH 200-200-20 MG/5ML PO SUSP: 30.0000 mL | Freq: Four times a day (QID) | ORAL | Status: DC | PRN

## 2018-08-27 MED ORDER — ACETAMINOPHEN 325 MG PO TABS
650.0000 mg | ORAL_TABLET | Freq: Once | ORAL | Status: AC
  Administered 2018-08-27: 650 mg via ORAL
  Filled 2018-08-27: qty 2

## 2018-08-27 MED ORDER — VITAMIN B-1 100 MG PO TABS
100.0000 mg | ORAL_TABLET | Freq: Every day | ORAL | Status: DC
  Administered 2018-08-27: 100 mg via ORAL
  Filled 2018-08-27: qty 1

## 2018-08-27 MED ORDER — ARIPIPRAZOLE 10 MG PO TABS
10.0000 mg | ORAL_TABLET | Freq: Every day | ORAL | Status: DC
  Filled 2018-08-27 (×2): qty 1

## 2018-08-27 MED ORDER — VITAMIN B-1 100 MG PO TABS
100.0000 mg | ORAL_TABLET | Freq: Every day | ORAL | Status: DC
  Administered 2018-08-28 – 2018-08-29 (×2): 100 mg via ORAL
  Filled 2018-08-27 (×5): qty 1

## 2018-08-27 MED ORDER — SERTRALINE HCL 50 MG PO TABS
50.0000 mg | ORAL_TABLET | Freq: Every day | ORAL | Status: DC
  Filled 2018-08-27 (×2): qty 1

## 2018-08-27 MED ORDER — ATORVASTATIN CALCIUM 10 MG PO TABS: 10.0000 mg | ORAL_TABLET | Freq: Every day | ORAL | Status: DC

## 2018-08-27 MED ORDER — SERTRALINE HCL 25 MG PO TABS
25.0000 mg | ORAL_TABLET | Freq: Every day | ORAL | Status: DC
  Filled 2018-08-27 (×2): qty 1

## 2018-08-27 NOTE — [facility] Group Notes (Signed)
Adult Psychoeducational Group Note  Date:  08/27/2018 Time:  9:03 PM  Group Topic/Focus:  Wrap-Up Group:   The focus of this group is to help patients review their daily goal of treatment and discuss progress on daily workbooks.  Participation Level:  Active  Participation Quality:  Appropriate and Attentive  Affect:  Appropriate  Cognitive:  Alert and Appropriate  Insight: Appropriate and Good  Engagement in Group:  Engaged  Modes of Intervention:  Discussion and Education  Additional Comments:  Pt attended and participated in wrap up group this evening. Pt rated their day a 4/10, due to them deciding to come to [facility]. Pt told writer that their day started at a 0 because they were "not okay". Pt goal while they are here is to work on "forgiving themselves for not being well".   Chrisandra Netters 08/27/2018, 9:03 PM

## 2018-08-27 NOTE — Tx Team (Signed)
Initial Treatment Plan 08/27/2018 1:31 PM Julia Wheeler [id number]    PATIENT STRESSORS: Financial difficulties Health problems Occupational concerns Substance abuse   PATIENT STRENGTHS: Average or above average intelligence Capable of independent living Communication skills Motivation for treatment/growth Supportive family/friends   PATIENT IDENTIFIED PROBLEMS: "work on my depression"  "get my medications right"                   DISCHARGE CRITERIA:  Ability to meet basic life and health needs Improved stabilization in mood, thinking, and/or behavior Motivation to continue treatment in a less acute level of care  PRELIMINARY DISCHARGE PLAN: Return to previous living arrangement  PATIENT/FAMILY INVOLVEMENT: This treatment plan has been presented to and reviewed with the patient, Julia Wheeler.  The patient and family have been given the opportunity to ask questions and make suggestions.  Raylene Miyamoto, RN 08/27/2018, 1:31 PM
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
## 2018-08-27 NOTE — ED Notes (Signed)
Pt c/o itching to arms and abdomen after being placed in paperscrubs. Mild redness noted to arms Pt given gown to change into.

## 2018-08-27 NOTE — ED Notes (Signed)
Pt denies SI/HI - states she is feeling depressed and feels she needs Inpt Tx. States last was earlier this year - 2019.

## 2018-08-27 NOTE — Progress Notes (Signed)
Pt accepted to  [facility], Bed 400-2 Nira Conn, NP is the accepting provider.  Nehemiah Massed, MD,  is the attending provider.  Call report to [phone]  [email or web address]  ED notified.   Pt is Voluntary.  Pt may be transported by [facility]  Pt scheduled  to arrive at [facility] at 11:30 AM  Carney Bern T. Kaylyn Lim, MSW, LCSWA Disposition Clinical Social Work [phone] (cell) [phone] (office)

## 2018-08-27 NOTE — Progress Notes (Signed)
Patient ID: Julia Wheeler, female   DOB: 08-19-1966, 52 y.o.   MRN: [id number]  Admission Note  Pt is a 52 yo female that presents to [place] voluntarily with worsening anxiety and depression. Pt states that she has been through two jobs this year because of her crippling anxiety that doesn't allow her to get out of bed some days. Pt became tearful talking about this on admission, and hyperventilated when she knew that she was going to have a roommate. Pt is now in a room alone with a DNA. Pt states she drinks 1-2 days/week, where she will drink a pint of vodka. Pt denies any drug use. Pt states she had a previous suicide attempt last year where she took pills and drank alcohol. Pt is guarded on assessment but states she did ECT before and it worked. Pt is open to trying this again.   Consents signed, skin/belongings search completed and patient oriented to unit. Patient stable at this time. Patient given the opportunity to express concerns and ask questions. Patient given toiletries. Will continue to monitor.

## 2018-08-27 NOTE — ED Notes (Signed)
Regular Diet was ordered for Lunch. 

## 2018-08-27 NOTE — ED Notes (Signed)
Pt voiced agreement w/tx plan - accepted to [facility] - signed consent form, copy faxed to [facility] [address], copy sent to Medical Records, and original placed in envelope for [facility].

## 2018-08-27 NOTE — ED Notes (Signed)
Pt called family member to advise of transport.

## 2018-08-27 NOTE — ED Notes (Signed)
Pt was wearing hospital gown d/t c/o scrubs itchy. Pt changed into burgundy scrubs as requested for transport to [facility].
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
## 2018-08-27 NOTE — ED Provider Notes (Signed)
  Physical Exam  BP (!) 144/100   Pulse 83   Temp 98.2 F (36.8 C) (Oral)   Resp 16   LMP 08/28/2015 (Approximate)   SpO2 99%   Physical Exam  ED Course/Procedures     Procedures  MDM  Accepted at [facility] MD 08/27/18 [phone]

## 2018-08-27 NOTE — ED Notes (Signed)
Breakfast tray ordered 

## 2018-08-27 NOTE — Progress Notes (Signed)
Pt was observed in the dayroom, no interaction with peers. Pt appears blank/forgetful in affect and mood. Pt denies SI/HI/Pain at this time. Pt endorses AVH stating "I see many faces and hear random noises at night that wakes me up". Pt c/o of insomnia. Provider on call notified. Per medical history, Ativan 1X was ordered. EKG was repeated and place in front of chart. Pt states she hopes to speak with provider about trying ECT again. Support and encouragement provided. Will continue with POC.

## 2018-08-27 NOTE — ED Notes (Signed)
ALL belongings - 1 labeled belongings bag, 1 purse, and valuables envelope - [facility].
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
## 2018-08-27 NOTE — BHH Group Notes (Signed)
LCSW Group Therapy Note 08/27/2018 2:19 PM  Type of Therapy/Topic: Group Therapy: Feelings about Diagnosis  Participation Level: Active   Description of Group:  This group will allow patients to explore their thoughts and feelings about diagnoses they have received. Patients will be guided to explore their level of understanding and acceptance of these diagnoses. Facilitator will encourage patients to process their thoughts and feelings about the reactions of others to their diagnosis and will guide patients in identifying ways to discuss their diagnosis with significant others in their lives. This group will be process-oriented, with patients participating in exploration of their own experiences, giving and receiving support, and processing challenge from other group members.  Therapeutic Goals: 1. Patient will demonstrate understanding of diagnosis as evidenced by identifying two or more symptoms of the disorder 2. Patient will be able to express two feelings regarding the diagnosis 3. Patient will demonstrate their ability to communicate their needs through discussion and/or role play  Summary of Patient Progress:  Jakiria was engaged and participated throughout the group session. Twania reports that having a mental health diagnosis is "hurtful". Maizie shared with the group how her son hurt her feelings due to him minimizing her mental health symptoms. Jann reports you learn to "fake it till you make it".     Therapeutic Modalities:  Cognitive Behavioral Therapy Brief Therapy Feelings Identification    Kenyotta Dorfman Catalina Antigua Clinical Social Worker

## 2018-08-28 ENCOUNTER — Telehealth: Payer: Self-pay | Admitting: Internal Medicine

## 2018-08-28 ENCOUNTER — Ambulatory Visit (HOSPITAL_COMMUNITY)
Admission: AD | Admit: 2018-08-28 | Discharge: 2018-08-28 | Disposition: A | Discharge status: Home / Self Care | Payer: Federal, State, Local not specified - Other | Source: Intra-hospital | Attending: Internal Medicine | Admitting: Internal Medicine

## 2018-08-28 LAB — BRAIN NATRIURETIC PEPTIDE: B NATRIURETIC PEPTIDE 5: 19.5 pg/mL (ref 0.0–100.0)

## 2018-08-28 LAB — MAGNESIUM: Magnesium: 2.2 mg/dL (ref 1.7–2.4)

## 2018-08-28 MED ORDER — ARIPIPRAZOLE 2 MG PO TABS
2.0000 mg | ORAL_TABLET | Freq: Every day | ORAL | Status: DC
  Filled 2018-08-28 (×3): qty 1

## 2018-08-28 MED ORDER — AMLODIPINE BESYLATE 5 MG PO TABS
5.0000 mg | ORAL_TABLET | Freq: Every day | ORAL | Status: DC
  Administered 2018-08-28 – 2018-08-29 (×2): 5 mg via ORAL
  Filled 2018-08-28 (×4): qty 1

## 2018-08-28 NOTE — Progress Notes (Signed)
Patient returned approximately 10:30 a.m. From [facility] Radiology with [facility] via [employer].

## 2018-08-28 NOTE — Progress Notes (Signed)
Nurse called [facility] for patient to be picked up and taken to [facility] Radiology for chest xray per MD order.  Respirations even and unlabored.  No signs/symptoms of pain/distress noted on patient's face/body movements.  Safety maintained with 15 minute checks.

## 2018-08-28 NOTE — Progress Notes (Signed)
Recreation Therapy Notes  Date: 10.16.19 Time: 0930 Location: 300 Hall Dayroom  Group Topic: Stress Management  Goal Area(s) Addresses:  Patient will verbalize importance of using healthy stress management.  Patient will identify positive emotions associated with healthy stress management.   Intervention: Stress Management  Activity :  Meditation.  LRT introduced the stress management technique of meditation.  LRT played a meditation that dealt with resilience.  Patients were to listen and follow along as meditation played.  Education:  Stress Management, Discharge Planning.   Education Outcome: Acknowledges edcuation/In group clarification offered/Needs additional education  Clinical Observations/Feedback: Pt did not attend group.    Caroll Rancher, LRT/CTRS         Caroll Rancher A 08/28/2018 11:33 AM

## 2018-08-28 NOTE — Telephone Encounter (Signed)
New message   Per Dr. Andria Frames, patient is in hospital now have questions about her heart. Please call ASAP.

## 2018-08-28 NOTE — Plan of Care (Signed)
Nurse discussed anxiety, depression and coping skills with patient.  

## 2018-08-28 NOTE — Progress Notes (Signed)
EKG completed and given to MD for review.  

## 2018-08-28 NOTE — BHH Group Notes (Signed)
[facility] Group Therapy 08/28/2018 1:15pm  Type of Therapy: Mental Health Association Presentation  Participation Level: Invited. Chose to remain in bed.   Rona Ravens, LCSW 08/28/2018 11:03 AM

## 2018-08-28 NOTE — Progress Notes (Signed)
Writer attempted to call [facility] Radiology to schedule chest XR for Pt. Writer was told to call back at 0630.

## 2018-08-28 NOTE — Progress Notes (Signed)
Pt was observed in the dayroom, no interaction with peers. Pt appears blank/forgetful/anxious in affect and mood. Pt denies SI/HI/Pain at this time. Pt endorses AVH stating "I see many faces and hear random noises at night that wakes me up".Pt c/o of insomnia and anxiety. Pt was encourage to speak with provider. CIWA 11. Ativan requested and given. Pt was given a dinner tray this evening. Vitals Q8 taken. HR elevated. Pt given fluids.Support and encouragement provided. Will continue with POC.

## 2018-08-28 NOTE — Telephone Encounter (Signed)
Dr Klein to address. 

## 2018-08-28 NOTE — Progress Notes (Signed)
D:  Patient's self inventory sheet, patient has poor sleep, sleep medication not helpful.  Fair appetite, low energy level, poor concentration.  Rated depression and hopeless 9, anxiety 10.  Denied withdrawals.  Denied SI.  Physical problem, headaches, pain.  Physical pain, worst pain #8 in past 24 hours, "My whole body but especially my lower back & rt hand."  Pain medicine not helpful.  Goal is "my anxiety, mood & depression.  ECT ?"  No discharge plans. A:  Medications administered per MD orders.  Emotional support and encouragement given patient. R:  Denied SI and HI, contracts for safety.  Denied A/V hallucinations.  Safety maintained with 15 minute checks.

## 2018-08-28 NOTE — Progress Notes (Signed)
NUTRITION ASSESSMENT  Pt identified as at risk on the Malnutrition Screen Tool  INTERVENTION: 1. Supplements: Ensure Enlive po BID, each supplement provides 350 kcal and 20 grams of protein  NUTRITION DIAGNOSIS: Unintentional weight loss related to sub-optimal intake as evidenced by pt report.   Goal: Pt to meet >/= 90% of their estimated nutrition needs.  Monitor:  PO intake  Assessment:  Pt admitted with depression. Was recently assessed by nutrition staff at [facility] where patient reported weight loss and poor appetite. Pt with history of gastric bypass surgery in 2004. Per weight records, pt has lost 20 lb since November 2018. Continue Ensure supplements, MVI and Thiamine supplementation.  Height: Ht Readings from Last 1 Encounters:  08/27/18 5\' 7"  (1.702 m)    Weight: Wt Readings from Last 1 Encounters:  08/27/18 71.2 kg    Weight Hx: Wt Readings from Last 10 Encounters:  08/27/18 71.2 kg  08/07/18 71.7 kg  07/27/18 70.6 kg  12/17/17 70.3 kg  11/22/17 74.4 kg  11/16/17 75 kg  09/25/17 80.5 kg  06/28/17 72.3 kg  05/24/17 77.1 kg  05/09/17 77.1 kg    BMI:  Body mass index is 24.59 kg/m. Pt meets criteria for normal based on current BMI.  Estimated Nutritional Needs: Kcal: 25-30 kcal/kg Protein: > 1 gram protein/kg Fluid: 1 ml/kcal  Diet Order:  Diet Order            Diet Heart Room service appropriate? Yes; Fluid consistency: Thin  Diet effective now             Pt is also offered choice of unit snacks mid-morning and mid-afternoon.   Lab results and medications reviewed.   Tilda Franco, MS, RD, LDN [facility] Inpatient Clinical Dietitian Pager: [phone] After Hours Pager: [phone]

## 2018-08-28 NOTE — Progress Notes (Signed)
TRH   Chart reviewed and case discussed with psychiatric attending   Hypertension Suboptimal, dyspnea felt to be related to severe elevated blood pressure.  Prior to admission antihypertensive medications were discontinued due to orthostatic hypotension.  She has been started on low-dose metoprolol since she was tachycardic however diastolic blood pressure remains elevated.  Will add 5 mg of amlodipine.  Monitor orthostatic vital signs.  Dyspnea Felt to be related to severe hypertension.  Chest x-ray normal, BMP within normal limits.  Saturating well at room air.  Reviewed echo last month within normal limits.  Prolonged QTC Avoid QTC prolonging agent.  Will continue to monitor peripherally   Latrelle Dodrill, MD

## 2018-08-28 NOTE — Tx Team (Signed)
Interdisciplinary Treatment and Diagnostic Plan Update  08/28/2018 Time of Session: [name] [name] MRN: [id number]  Principal Diagnosis: <principal problem not specified>  Secondary Diagnoses: Active Problems:   MDD (major depressive disorder), recurrent severe, without psychosis ([name])   Current Medications:  Current Facility-Administered Medications  Medication Dose Route Frequency Provider Last Rate Last Dose  . acetaminophen (TYLENOL) tablet 650 mg  650 mg Oral Q6H PRN Cobos, Myer Peer, MD   650 mg at 08/27/18 1256  . amLODipine (NORVASC) tablet 5 mg  5 mg Oral Daily Patrecia Pour, Christean Grief, MD   5 mg at 08/28/18 1500  . atorvastatin (LIPITOR) tablet 10 mg  10 mg Oral QHS Rankin, Shuvon B, NP   10 mg at 08/27/18 2131  . feeding supplement (ENSURE ENLIVE) (ENSURE ENLIVE) liquid 237 mL  237 mL Oral BID BM Cobos, Myer Peer, MD   237 mL at 08/28/18 1500  . LORazepam (ATIVAN) tablet 1 mg  1 mg Oral Q6H PRN Cobos, Myer Peer, MD   1 mg at 08/28/18 0825  . metoprolol tartrate (LOPRESSOR) tablet 12.5 mg  12.5 mg Oral BID Hosie Poisson, MD   12.5 mg at 08/28/18 0825  . multivitamin with minerals tablet 1 tablet  1 tablet Oral Daily Cobos, Myer Peer, MD   1 tablet at 08/28/18 0752  . thiamine (VITAMIN B-1) tablet 100 mg  100 mg Oral Daily Cobos, Myer Peer, MD   100 mg at 08/28/18 2683   PTA Medications: Medications Prior to Admission  Medication Sig Dispense Refill Last Dose  . ARIPiprazole (ABILIFY) 10 MG tablet Take 1 tablet (10 mg total) by mouth daily. 30 tablet 1 Past Week  . atorvastatin (LIPITOR) 10 MG tablet Take 1 tablet (10 mg total) by mouth at bedtime. 30 tablet 1 Past Week  . b complex vitamins capsule Take 1 capsule by mouth daily.   2 weeks ago  . ferrous sulfate 325 (65 FE) MG tablet Take 1 tablet (325 mg total) by mouth daily with breakfast. 30 tablet 0 Past Week  . loratadine (CLARITIN) 10 MG tablet Take 1 tablet (10 mg total) by mouth daily. (Patient taking  differently: Take 10 mg by mouth daily as needed for allergies. ) 14 tablet 1 2 weeks ago  . Multiple Vitamins-Minerals (ADULT GUMMY PO) Take 4 tablets by mouth daily.   Past Month  . pantoprazole (PROTONIX) 40 MG tablet Take 40 mg by mouth as needed (For heartburn or acid reflux.).    months ago  . sertraline (ZOLOFT) 100 MG tablet Take 2 tablets (200 mg total) by mouth daily. 60 tablet 1 2 weeks ago  . Tetrahydroz-Glyc-Hyprom-PEG (VISINE MAXIMUM REDNESS RELIEF OP) Place 2 drops into both eyes as needed (For allergies.).   08/25/2018    Patient Stressors: Financial difficulties Health problems Occupational concerns Substance abuse  Patient Strengths: Average or above average intelligence Capable of independent living Communication skills Motivation for treatment/growth Supportive family/friends  Treatment Modalities: Medication Management, Group therapy, Case management,  1 to 1 session with clinician, Psychoeducation, Recreational therapy.   Physician Treatment Plan for Primary Diagnosis: <principal problem not specified> Long Term Goal(s): Improvement in symptoms so as ready for discharge Improvement in symptoms so as ready for discharge   Short Term Goals: Ability to identify changes in lifestyle to reduce recurrence of condition will improve Ability to maintain clinical measurements within normal limits will improve Ability to identify changes in lifestyle to reduce recurrence of condition will improve Ability to verbalize feelings  will improve Ability to disclose and discuss suicidal ideas Ability to demonstrate self-control will improve Ability to identify and develop effective coping behaviors will improve  Medication Management: Evaluate patient's response, side effects, and tolerance of medication regimen.  Therapeutic Interventions: 1 to 1 sessions, Unit Group sessions and Medication administration.  Evaluation of Outcomes: Not Met  Physician Treatment Plan for  Secondary Diagnosis: Active Problems:   MDD (major depressive disorder), recurrent severe, without psychosis ([name])  Long Term Goal(s): Improvement in symptoms so as ready for discharge Improvement in symptoms so as ready for discharge   Short Term Goals: Ability to identify changes in lifestyle to reduce recurrence of condition will improve Ability to maintain clinical measurements within normal limits will improve Ability to identify changes in lifestyle to reduce recurrence of condition will improve Ability to verbalize feelings will improve Ability to disclose and discuss suicidal ideas Ability to demonstrate self-control will improve Ability to identify and develop effective coping behaviors will improve     Medication Management: Evaluate patient's response, side effects, and tolerance of medication regimen.  Therapeutic Interventions: 1 to 1 sessions, Unit Group sessions and Medication administration.  Evaluation of Outcomes: Not Met   RN Treatment Plan for Primary Diagnosis: <principal problem not specified> Long Term Goal(s): Knowledge of disease and therapeutic regimen to maintain health will improve  Short Term Goals: Ability to identify and develop effective coping behaviors will improve and Compliance with prescribed medications will improve  Medication Management: RN will administer medications as ordered by provider, will assess and evaluate patient's response and provide education to patient for prescribed medication. RN will report any adverse and/or side effects to prescribing provider.  Therapeutic Interventions: 1 on 1 counseling sessions, Psychoeducation, Medication administration, Evaluate responses to treatment, Monitor vital signs and CBGs as ordered, Perform/monitor CIWA, COWS, AIMS and Fall Risk screenings as ordered, Perform wound care treatments as ordered.  Evaluation of Outcomes: Not Met   LCSW Treatment Plan for Primary Diagnosis: <principal problem not  specified> Long Term Goal(s): Safe transition to appropriate next level of care at discharge, Engage patient in therapeutic group addressing interpersonal concerns.  Short Term Goals: Engage patient in aftercare planning with referrals and resources, Increase social support and Increase skills for wellness and recovery  Therapeutic Interventions: Assess for all discharge needs, 1 to 1 time with Social worker, Explore available resources and support systems, Assess for adequacy in community support network, Educate family and significant other(s) on suicide prevention, Complete Psychosocial Assessment, Interpersonal group therapy.  Evaluation of Outcomes: Not Met   Progress in Treatment: Attending groups: Yes. Participating in groups: Yes. Taking medication as prescribed: Yes. Toleration medication: Yes. Family/Significant other contact made: No, will contact:  friend Patient understands diagnosis: Yes. Discussing patient identified problems/goals with staff: Yes. Medical problems stabilized or resolved: Yes. Denies suicidal/homicidal ideation: Yes. Issues/concerns per patient self-inventory: No. Other: none  New problem(s) identified: No, Describe:  none  New Short Term/Long Term Goal(s):  Patient Goals:  "get stabilized with my depression"  Discharge Plan or Barriers:   Reason for Continuation of Hospitalization: Depression Medication stabilization  Estimated Length of Stay: 3-5 days  Attendees: Patient:Julia Wheeler 08/28/2018   Physician: Larene Beach, MD 08/28/2018   Nursing: Grayland Ormond, RN 08/28/2018   RN Care Manager: 08/28/2018   Social Worker: Lurline Idol, LCSW 08/28/2018   Recreational Therapist:  08/28/2018   Other:  08/28/2018   Other:  08/28/2018   Other: 08/28/2018        Scribe for Treatment Team:  Joanne Chars, LCSW 08/28/2018 3:32 PM
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
## 2018-08-29 ENCOUNTER — Encounter: Payer: Self-pay | Admitting: Behavioral Health

## 2018-08-29 ENCOUNTER — Inpatient Hospital Stay
Admission: AD | Admit: 2018-08-29 | Discharge: 2018-09-07 | DRG: 885 | Disposition: A | Discharge status: Home / Self Care | Payer: No Typology Code available for payment source | Source: Intra-hospital | Attending: Psychiatry | Admitting: Psychiatry

## 2018-08-29 ENCOUNTER — Other Ambulatory Visit: Payer: Self-pay

## 2018-08-29 ENCOUNTER — Other Ambulatory Visit: Payer: Self-pay | Admitting: Psychiatry

## 2018-08-29 DIAGNOSIS — Z9114 Patient's other noncompliance with medication regimen: Secondary | ICD-10-CM | POA: Diagnosis not present

## 2018-08-29 DIAGNOSIS — F4310 Post-traumatic stress disorder, unspecified: Secondary | ICD-10-CM | POA: Diagnosis present

## 2018-08-29 DIAGNOSIS — T50906A Underdosing of unspecified drugs, medicaments and biological substances, initial encounter: Secondary | ICD-10-CM | POA: Diagnosis present

## 2018-08-29 DIAGNOSIS — Z560 Unemployment, unspecified: Secondary | ICD-10-CM

## 2018-08-29 DIAGNOSIS — Z888 Allergy status to other drugs, medicaments and biological substances status: Secondary | ICD-10-CM

## 2018-08-29 DIAGNOSIS — M1990 Unspecified osteoarthritis, unspecified site: Secondary | ICD-10-CM | POA: Diagnosis present

## 2018-08-29 DIAGNOSIS — E785 Hyperlipidemia, unspecified: Secondary | ICD-10-CM | POA: Diagnosis present

## 2018-08-29 DIAGNOSIS — F332 Major depressive disorder, recurrent severe without psychotic features: Secondary | ICD-10-CM | POA: Diagnosis present

## 2018-08-29 DIAGNOSIS — Z8249 Family history of ischemic heart disease and other diseases of the circulatory system: Secondary | ICD-10-CM

## 2018-08-29 DIAGNOSIS — Z8701 Personal history of pneumonia (recurrent): Secondary | ICD-10-CM | POA: Diagnosis not present

## 2018-08-29 DIAGNOSIS — Z91030 Bee allergy status: Secondary | ICD-10-CM

## 2018-08-29 DIAGNOSIS — I5022 Chronic systolic (congestive) heart failure: Secondary | ICD-10-CM | POA: Diagnosis present

## 2018-08-29 DIAGNOSIS — Z91120 Patient's intentional underdosing of medication regimen due to financial hardship: Secondary | ICD-10-CM

## 2018-08-29 DIAGNOSIS — D573 Sickle-cell trait: Secondary | ICD-10-CM | POA: Diagnosis present

## 2018-08-29 DIAGNOSIS — R45851 Suicidal ideations: Secondary | ICD-10-CM | POA: Diagnosis present

## 2018-08-29 DIAGNOSIS — Z87442 Personal history of urinary calculi: Secondary | ICD-10-CM | POA: Diagnosis not present

## 2018-08-29 DIAGNOSIS — F1020 Alcohol dependence, uncomplicated: Secondary | ICD-10-CM | POA: Diagnosis present

## 2018-08-29 DIAGNOSIS — I110 Hypertensive heart disease with heart failure: Secondary | ICD-10-CM | POA: Diagnosis present

## 2018-08-29 DIAGNOSIS — Y92009 Unspecified place in unspecified non-institutional (private) residence as the place of occurrence of the external cause: Secondary | ICD-10-CM

## 2018-08-29 DIAGNOSIS — Z9851 Tubal ligation status: Secondary | ICD-10-CM | POA: Diagnosis not present

## 2018-08-29 DIAGNOSIS — Z79899 Other long term (current) drug therapy: Secondary | ICD-10-CM

## 2018-08-29 DIAGNOSIS — Z8711 Personal history of peptic ulcer disease: Secondary | ICD-10-CM

## 2018-08-29 DIAGNOSIS — I428 Other cardiomyopathies: Secondary | ICD-10-CM | POA: Diagnosis present

## 2018-08-29 DIAGNOSIS — K219 Gastro-esophageal reflux disease without esophagitis: Secondary | ICD-10-CM | POA: Diagnosis present

## 2018-08-29 DIAGNOSIS — Z6825 Body mass index (BMI) 25.0-25.9, adult: Secondary | ICD-10-CM | POA: Diagnosis not present

## 2018-08-29 DIAGNOSIS — K5790 Diverticulosis of intestine, part unspecified, without perforation or abscess without bleeding: Secondary | ICD-10-CM | POA: Diagnosis present

## 2018-08-29 DIAGNOSIS — E669 Obesity, unspecified: Secondary | ICD-10-CM | POA: Diagnosis present

## 2018-08-29 DIAGNOSIS — Z885 Allergy status to narcotic agent status: Secondary | ICD-10-CM

## 2018-08-29 DIAGNOSIS — Z818 Family history of other mental and behavioral disorders: Secondary | ICD-10-CM

## 2018-08-29 DIAGNOSIS — G3184 Mild cognitive impairment, so stated: Secondary | ICD-10-CM | POA: Diagnosis present

## 2018-08-29 DIAGNOSIS — I10 Essential (primary) hypertension: Secondary | ICD-10-CM | POA: Diagnosis present

## 2018-08-29 MED ORDER — AMITRIPTYLINE HCL 25 MG PO TABS
25.0000 mg | ORAL_TABLET | Freq: Every day | ORAL | Status: DC
  Administered 2018-08-29 – 2018-09-06 (×9): 25 mg via ORAL
  Filled 2018-08-29 (×9): qty 1

## 2018-08-29 MED ORDER — ACETAMINOPHEN 325 MG PO TABS
650.0000 mg | ORAL_TABLET | Freq: Four times a day (QID) | ORAL | Status: DC | PRN
  Filled 2018-08-29 (×3): qty 2

## 2018-08-29 MED ORDER — ACETAMINOPHEN 325 MG PO TABS: 650.0000 mg | ORAL_TABLET | Freq: Four times a day (QID) | ORAL | Status: DC | PRN

## 2018-08-29 MED ORDER — ATORVASTATIN CALCIUM 20 MG PO TABS
10.0000 mg | ORAL_TABLET | Freq: Every day | ORAL | Status: DC
  Administered 2018-08-30 – 2018-09-06 (×8): 10 mg via ORAL
  Filled 2018-08-29 (×8): qty 1

## 2018-08-29 MED ORDER — ENSURE ENLIVE PO LIQD: 237.0000 mL | Freq: Two times a day (BID) | ORAL | 12 refills | Status: DC

## 2018-08-29 MED ORDER — METOPROLOL TARTRATE 25 MG PO TABS: 12.5000 mg | ORAL_TABLET | Freq: Two times a day (BID) | ORAL | Status: DC

## 2018-08-29 MED ORDER — METOPROLOL TARTRATE 25 MG PO TABS
12.5000 mg | ORAL_TABLET | Freq: Two times a day (BID) | ORAL | Status: DC
  Administered 2018-08-29 – 2018-09-07 (×17): 12.5 mg via ORAL
  Filled 2018-08-29 (×17): qty 1

## 2018-08-29 MED ORDER — ALUM & MAG HYDROXIDE-SIMETH 200-200-20 MG/5ML PO SUSP
30.0000 mL | ORAL | Status: DC | PRN
  Administered 2018-09-01 – 2018-09-05 (×4): 30 mL via ORAL
  Filled 2018-08-29 (×5): qty 30

## 2018-08-29 MED ORDER — AMLODIPINE BESYLATE 5 MG PO TABS
5.0000 mg | ORAL_TABLET | Freq: Every day | ORAL | Status: DC
  Administered 2018-08-30 – 2018-09-07 (×9): 5 mg via ORAL
  Filled 2018-08-29 (×9): qty 1

## 2018-08-29 MED ORDER — MAGNESIUM HYDROXIDE 400 MG/5ML PO SUSP: 30.0000 mL | Freq: Every day | ORAL | Status: DC | PRN

## 2018-08-29 MED ORDER — ADULT MULTIVITAMIN W/MINERALS CH: 1.0000 | ORAL_TABLET | Freq: Every day | ORAL | Status: DC

## 2018-08-29 MED ORDER — AMLODIPINE BESYLATE 5 MG PO TABS: 5.0000 mg | ORAL_TABLET | Freq: Every day | ORAL | Status: DC

## 2018-08-29 MED ORDER — ATORVASTATIN CALCIUM 10 MG PO TABS: 10.0000 mg | ORAL_TABLET | Freq: Every day | ORAL | 1 refills | Status: DC

## 2018-08-29 NOTE — BH Assessment (Signed)
Patient has been accepted to [facility].  Accepting physician is Dr. Toni Amend.  Attending Physician will be Dr. Toni Amend.  Patient has been assigned to room 306, by [facility] Charge Nurse T'Yawn.  Call report to [phone].  Representative/Transfer Coordinator is Psychologist, prison and probation services. Patient pre-admitted by [facility] Patient Access Ethelene Browns)   [facility] ER Staff Tammy Sours, TTS) made aware of acceptance.

## 2018-08-29 NOTE — Progress Notes (Addendum)
CSW spoke to Dr Toni Amend regarding possible ECT treatment at the end of the day on 10/16.  CSW received phone messages from Dr Jama Flavors and Dr Toni Amend reporting pt can be transferred to [facility] for ECT on 08/29/18.  CSW spoke to [facility] in TTS at [facility] 08/29/18 0750.  She will speak to BMU charge RN to discuss bed availability. Garner Nash, MSW, LCSW Clinical Social Worker 08/29/2018 7:52 AM   CSW spoke to [place] in TTS at [facility].  She has spoken to [employer] on unit, no bed number yet, waiting on that.  Does look like transfer can happen today. Garner Nash, MSW, LCSW Clinical Social Worker 08/29/2018 10:24 AM

## 2018-08-29 NOTE — BHH Group Notes (Signed)
LCSW Group Therapy Note  08/29/2018 1:00 pm  Type of Therapy/Topic:  Group Therapy:  Balance in Life  Participation Level:  Did Not Attend  Description of Group:    This group will address the concept of balance and how it feels and looks when one is unbalanced. Patients will be encouraged to process areas in their lives that are out of balance and identify reasons for remaining unbalanced. Facilitators will guide patients in utilizing problem-solving interventions to address and correct the stressor making their life unbalanced. Understanding and applying boundaries will be explored and addressed for obtaining and maintaining a balanced life. Patients will be encouraged to explore ways to assertively make their unbalanced needs known to significant others in their lives, using other group members and facilitator for support and feedback.  Therapeutic Goals: 1. Patient will identify two or more emotions or situations they have that consume much of in their lives. 2. Patient will identify signs/triggers that life has become out of balance:  3. Patient will identify two ways to set boundaries in order to achieve balance in their lives:  4. Patient will demonstrate ability to communicate their needs through discussion and/or role plays  Summary of Patient Progress:      Therapeutic Modalities:   Cognitive Behavioral Therapy Solution-Focused Therapy Assertiveness Training  Alease Frame, LCSW 08/29/2018 3:19 PM

## 2018-08-29 NOTE — Progress Notes (Signed)
Patient ID: Julia Wheeler [name], female   DOB: 1966-08-19, 52 y.o.   MRN: [id number]  Attempted to call report to [facility] BMU. Spoke to [place] and was informed the charge nurse would call back. Provided phone number and informed unit [facility] had been contacted for transport. Per [facility], patient to be received by [facility] BMU between 1pm-2pm.

## 2018-08-29 NOTE — Progress Notes (Signed)
  [facility] Adult Case Management Discharge Plan :  Will you be returning to the same living situation after discharge:  No. Pt going to ECT treatment at [facility] directly at discharge. At discharge, do you have transportation home?: No. [facility] to transport.  Do you have the ability to pay for your medications: No. No insurance.  Will work with [employer] when discharged from ECT.   Release of information consent forms completed and in the chart;  Patient's signature needed at discharge.  Patient to Follow up at: Follow-up Information    [facility]. Go on 08/29/2018.   Why:  You will be admitted to [facility] for ECT treatments with Dr. Toni Amend. Contact information: [address] [id number] [place] [address] [phone]          Next level of care provider has access to [facility] Link:yes  Safety Planning and Suicide Prevention discussed: Yes,  with friend     Has patient been referred to the Quitline?: N/A patient is not a smoker  Patient has been referred for addiction treatment: N/A  Lorri Frederick, LCSW 08/29/2018, 1:12 PM

## 2018-08-29 NOTE — Progress Notes (Signed)
Patient ID: Julia Wheeler [name], female   DOB: 01/27/1966, 52 y.o.   MRN: [id number]  Patient was discharged to the lobby via [facility] to be transferred to [facility]. Patient was ambulatory and in no acute distress. Discharge instructions reviewed with patient and paperwork provided. Copy of EMTALA provided. Patient agreeable to the discharge plan. All belongings returned and transferred with patient.

## 2018-08-29 NOTE — Progress Notes (Addendum)
Received Julia Wheeler from [facility], alert and oriented x4. Her affect is flat. She verbalized feeling depressed and mostly isolative for the past several months. She has a loop recorder to monitor her heart activity r/t syncope episodes. She requested a private room related to social anxiety.  She lives with her son, divorced x 10 years and have two best friends she listed as next of kin in case of an emergency. She was cooperative with the admission process. The skin assessment was completed and oriented to her new environment. She walks with a walker at home during intervals and requested/ given a walker. She verbalized having degenerative disc disease. A request was made to the doctor for a PT consult.

## 2018-08-29 NOTE — BHH Suicide Risk Assessment (Signed)
B[facility] INPATIENT:  Family/Significant Other Suicide Prevention Education  Suicide Prevention Education:  Education Completed; Lindwood Coke, friend, [phone], has been identified by the patient as the family member/significant other with whom the patient will be residing, and identified as the person(s) who will aid the patient in the event of a mental health crisis (suicidal ideations/suicide attempt).  With written consent from the patient, the family member/significant other has been provided the following suicide prevention education, prior to the and/or following the discharge of the patient.  The suicide prevention education provided includes the following:  Suicide risk factors  Suicide prevention and interventions  National Suicide Hotline telephone number  [facility] assessment telephone number  [facility] Emergency Assistance 911  [facility] and/or Residential Mobile Crisis Unit telephone number  Request made of family/significant other to:  Remove weapons (e.g., guns, rifles, knives), all items previously/currently identified as safety concern.  No guns, per [place].  Remove drugs/medications (over-the-counter, prescriptions, illicit drugs), all items previously/currently identified as a safety concern.  The family member/significant other verbalizes understanding of the suicide prevention education information provided.  The family member/significant other agrees to remove the items of safety concern listed above.   Lorri Frederick, LCSW 08/29/2018, 8:16 AM

## 2018-08-29 NOTE — Progress Notes (Signed)
Patient was sitting in the dayroom with peers at the beginning of this shift. Alert and oriented. Interacting with staff and peers appropriately. Watched TV with peers, attended group and currently eating a snack. Sad and depressed but maintains appropriate behavior. No major concern so far. Patient was encouraged  to talk to staff as needed. Emotional support provided and safety maintained at Q 15 mn level of observation as ordered.
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
## 2018-08-29 NOTE — Progress Notes (Addendum)
Patient ID: Julia Wheeler Julia Wheeler, female   DOB: 09/17/1966, 52 y.o.   MRN: [id number]  Nursing Progress Note [id number]  Data: Patient presents with flat affect and is guarded/minimal with Clinical research associate. Patient endorses feeling anxious and irritable this morning. Patient compliant with scheduled medications and requests PRN pain medication for her chronic back pain. Patient completed self-inventory sheet and rates depression, hopelessness, and anxiety 8,8,8 respectively. Patient rates their sleep and appetite as poor/fair respectively. Patient states goal for today is to "feel less anxious and agitated" and speak to the doctor about pain/anxiety medication. Patient appears withdrawn in the milieu. Patient currently denies SI/HI and endorses AH. Patient is currently ambulating with a steady gait but does endorse feeling "weak". PO fluids encouraged. Patient does not appear to be having withdrawal symptoms at this time.   Action: Patient is educated about and provided medication per provider's orders. Patient safety maintained with q15 min safety checks and frequent rounding. High fall risk precautions in place. Emotional support given. 1:1 interaction and active listening provided. Patient encouraged to attend meals, groups, and work on treatment plan and goals. Labs, vital signs and patient behavior monitored throughout shift. EKG performed.  Response: Patient remains safe on the unit at this time and agrees to come to staff with any issues/concerns. Patient is interacting with peers appropriately on the unit. Will continue to support and monitor.

## 2018-08-29 NOTE — Progress Notes (Signed)
Patient ID: Julia Wheeler, female   DOB: 05-25-1966, 52 y.o.   MRN: [id number]  Report called to [facility] and was given to RN Shay.

## 2018-08-29 NOTE — [facility] Suicide Risk Assessment (Addendum)
[facility] Discharge Suicide Risk Assessment   Principal Problem:  MDD  Discharge Diagnoses:  Patient Active Problem List   Diagnosis Date Noted  . MDD (major depressive disorder), recurrent episode, moderate (HCC) [F33.1]   . Orthostatic hypotension [I95.1] 07/23/2018  . Syncope [R55] 07/22/2018  . Severe recurrent major depression with psychotic features (HCC) [F33.3] 11/28/2017  . MDD (major depressive disorder), single episode, severe with psychosis (HCC) [F32.3] 11/22/2017  . Hyperactive small intestine s/p Dx laparoscopy 11/11/2017 [K59.9] 11/12/2017  . MDD (major depressive disorder), recurrent severe, without psychosis (HCC) [F33.2] 11/12/2017  . Depression with anxiety [F41.8] 11/11/2017  . GERD (gastroesophageal reflux disease) [K21.9] 11/11/2017  . Alcohol abuse [F10.10] 11/11/2017  . Diverticulosis [K57.90] 05/24/2017  . Internal hemorrhoids [K64.8] 05/24/2017  . Rectal bleeding [K62.5]   . Abnormal abdominal CT scan [R93.5]   . Gastric bypass status for obesity [Z98.84]   . Esophageal dysphagia [R13.10]   . Symptomatic anemia [D64.9] 05/22/2017  . Chronic left systolic heart failure (HCC) [I50.22] 06/26/2016  . Weight loss [R63.4] 05/04/2016  . B12 deficiency [E53.8] 05/04/2016  . MDD (major depressive disorder), recurrent, severe, with psychosis (HCC) [F33.3] 08/26/2015  . PTSD (post-traumatic stress disorder) [F43.10] 08/26/2015  . Alcohol use disorder, moderate, dependence (HCC) [F10.20] 08/26/2015  . Head trauma [S09.90XA] 08/26/2015  . AP (abdominal pain) [R10.9]   . Abdominal pain [R10.9] 12/27/2014  . Enteritis [K52.9] 12/27/2014  . Nausea with vomiting [R11.2] 12/27/2014  . Hyponatremia [E87.1] 12/27/2014  . Essential hypertension [I10] 12/27/2014  . Depression [F32.9] 12/27/2014  . Anemia, iron deficiency [D50.9] 12/27/2014  . Sickle cell trait (HCC) [D57.3]   . Iron deficiency anemia [D50.9]   . UGI bleed [K92.2] 05/18/2014  . Abdominal pain, left upper  quadrant [R10.12] 05/18/2014  . Intussusception of intestine - physiologic & self-resolved [K56.1] 05/17/2014    Total Time spent with patient: 30 minutes  Musculoskeletal: Strength & Muscle Tone: within normal limits Gait & Station: normal Patient leans: N/A  Psychiatric Specialty Exam: ROS reports mild headache, at this time does not endorse significant dizziness or lightheadedness, no chest pain, no shortness of breath at rest, reports some dyspnea on exertion but states it is getting better, no nausea, no vomiting   Blood pressure 107/90, pulse (!) 119, temperature 98.5 F (36.9 C), temperature source Oral, resp. rate 20, height 5\' 7"  (1.702 m), weight 71.2 kg, last menstrual period 08/28/2015.Body mass index is 24.59 kg/m.  General Appearance: Well Groomed  Eye Contact::  Good  Speech:  Normal Rate409  Volume:  Normal  Mood:  partially improved mood, less severely depressed, describes mood as 4-5/10  Affect:  remains constricted, but less severely than on admission  Thought Process:  Linear and Descriptions of Associations: Intact  Orientation:  Other:  fully alert and attentive  Thought Content:  no hallucinations, no delusions expressed   Suicidal Thoughts:  No denies current suicidal or self injurious ideations, no homicidal or violent ideations  Homicidal Thoughts:  No  Memory:  recent and remote grossly intact   Judgement:  Fair/ improving   Insight:  Present  Psychomotor Activity:  Decreased  Concentration:  Good  Recall:  Good  Fund of Knowledge:Good  Language: Good  Akathisia:  Negative  Handed:  Right  AIMS (if indicated):     Assets:  Desire for Improvement Resilience  Sleep:  Number of Hours: 6  Cognition: WNL  ADL's:  Intact   Mental Status Per Nursing Assessment::   On Admission:  Suicidal ideation  indicated by patient, Self-harm thoughts, Self-harm behaviors, Suicide plan  Demographic Factors:  52 year old female, employed, lives with adult  son  Loss Factors: Medical issues, reports history of cardiac illness  Historical Factors: History of depression, history of prior suicide attempts, history of good response to ECT   Risk Reduction Factors:   Sense of responsibility to family, Employed and Positive coping skills or problem solving skills  Continued Clinical Symptoms:  At this time patient is alert, attentive, calm, mood remains depressed but acknowledges some improvement compared to admission. She presents with less severely constricted affect and not tearful today. Denies suicidal or self injurious ideations . Denies current hallucinations and does not appear internally preoccupied, no delusions are expressed .  Presents calm, no psychomotor agitation. As noted , she has history of good response to ECT in the past, and has expressed interest in this treatment modality. In addition, antidepressant management options may be limited or carry increased risk due to prolonged QTc . Case has been evaluated by Dr. Toni Amend at [facility], who has agreed to ECT and accepted her for inpatient ECT treatment.  Patient motivated in transfer to [facility] for above, hopeful for improvement . 10/17- EKG QTc 493.   Cognitive Features That Contribute To Risk:  No gross cognitive deficits noted upon discharge. Is alert , attentive, and oriented x 3    Suicide Risk:  Moderate:  Frequent suicidal ideation with limited intensity, and duration, some specificity in terms of plans, no associated intent, good self-control, limited dysphoria/symptomatology, some risk factors present, and identifiable protective factors, including available and accessible social support.  Follow-up Information    [facility]. Go on 08/29/2018.   Why:  You will be admitted to [facility] for ECT treatments with Dr. Toni Amend. Contact information: [address] [id number] [address] [place] [address] [phone]           Plan Of Care/Follow-up recommendations:  Activity:  inpatient treatment Diet:  heart healthy Tests:  NA Other:  See below  Patient is being discharged from [facility] with plan of going to [facility] for inpatient ECT under the care of Dr. Ellen Henri physician.  Craige Cotta, MD 08/29/2018, 12:29 PM

## 2018-08-29 NOTE — Tx Team (Signed)
Initial Treatment Plan 08/29/2018 3:49 PM Roselyn Reef Anglique Vivenzio FIE:[id number]    PATIENT STRESSORS: Health problems   PATIENT STRENGTHS: Ability for insight Average or above average intelligence Capable of independent living Communication skills Financial means Supportive family/friends Work skills   PATIENT IDENTIFIED PROBLEMS: Depression  Anxiety/ social anxiety                   DISCHARGE CRITERIA:  Ability to meet basic life and health needs Adequate post-discharge living arrangements Improved stabilization in mood, thinking, and/or behavior  PRELIMINARY DISCHARGE PLAN: Return to previous living arrangement  PATIENT/FAMILY INVOLVEMENT: This treatment plan has been presented to and reviewed with the patient, Wetona Hollingsworth.  The patient has been given the opportunity to ask questions and make suggestions.  Rex Kras, RN 08/29/2018, 3:49 PM
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
## 2018-08-29 NOTE — Plan of Care (Signed)
Newly admitted but adjusting well to the milieu

## 2018-08-30 ENCOUNTER — Inpatient Hospital Stay: Payer: No Typology Code available for payment source

## 2018-08-30 ENCOUNTER — Inpatient Hospital Stay: Payer: No Typology Code available for payment source | Admitting: Certified Registered Nurse Anesthetist

## 2018-08-30 LAB — LIPID PANEL
Cholesterol: 210 mg/dL — ABNORMAL HIGH (ref 0–200)
HDL: 104 mg/dL
LDL Cholesterol: 96 mg/dL (ref 0–99)
Total CHOL/HDL Ratio: 2 ratio
Triglycerides: 48 mg/dL
VLDL: 10 mg/dL (ref 0–40)

## 2018-08-30 LAB — TSH: TSH: 0.589 u[IU]/mL (ref 0.350–4.500)

## 2018-08-30 LAB — GLUCOSE, CAPILLARY: Glucose-Capillary: 84 mg/dL (ref 70–99)

## 2018-08-30 LAB — HEMOGLOBIN A1C
Hgb A1c MFr Bld: 6 % — ABNORMAL HIGH (ref 4.8–5.6)
Mean Plasma Glucose: 125.5 mg/dL

## 2018-08-30 MED ORDER — ADULT MULTIVITAMIN W/MINERALS CH
1.0000 | ORAL_TABLET | Freq: Every day | ORAL | Status: DC
  Administered 2018-08-31 – 2018-09-06 (×7): 1 via ORAL
  Filled 2018-08-30 (×7): qty 1

## 2018-08-30 MED ORDER — TRAZODONE HCL 100 MG PO TABS
100.0000 mg | ORAL_TABLET | Freq: Every evening | ORAL | Status: DC | PRN
  Administered 2018-08-30 – 2018-09-06 (×8): 100 mg via ORAL
  Filled 2018-08-30 (×8): qty 1

## 2018-08-30 MED ORDER — SODIUM CHLORIDE 0.9 % IV SOLN
500.0000 mL | Freq: Once | INTRAVENOUS | Status: AC
  Administered 2018-08-30: 500 mL via INTRAVENOUS

## 2018-08-30 MED ORDER — MIRTAZAPINE 15 MG PO TABS
45.0000 mg | ORAL_TABLET | Freq: Every day | ORAL | Status: DC
  Administered 2018-08-30 – 2018-09-06 (×8): 45 mg via ORAL
  Filled 2018-08-30 (×8): qty 3

## 2018-08-30 MED ORDER — ONDANSETRON HCL 4 MG/2ML IJ SOLN: 4.0000 mg | Freq: Once | INTRAMUSCULAR | Status: DC | PRN

## 2018-08-30 MED ORDER — IBUPROFEN 600 MG PO TABS
600.0000 mg | ORAL_TABLET | Freq: Four times a day (QID) | ORAL | Status: DC | PRN
  Administered 2018-08-30 – 2018-09-02 (×6): 600 mg via ORAL
  Filled 2018-08-30 (×7): qty 1

## 2018-08-30 MED ORDER — ONDANSETRON HCL 4 MG PO TABS
4.0000 mg | ORAL_TABLET | Freq: Three times a day (TID) | ORAL | Status: DC | PRN
  Filled 2018-08-30: qty 1

## 2018-08-30 MED ORDER — METHOHEXITAL SODIUM 100 MG/10ML IV SOSY
PREFILLED_SYRINGE | INTRAVENOUS | Status: DC | PRN
  Administered 2018-08-30: 60 mg via INTRAVENOUS

## 2018-08-30 MED ORDER — PREMIER PROTEIN SHAKE
11.0000 [oz_av] | Freq: Two times a day (BID) | ORAL | Status: DC
  Administered 2018-08-31 – 2018-09-07 (×9): 11 [oz_av] via ORAL

## 2018-08-30 MED ORDER — SUCCINYLCHOLINE CHLORIDE 20 MG/ML IJ SOLN
INTRAMUSCULAR | Status: DC | PRN
  Administered 2018-08-30: 80 mg via INTRAVENOUS

## 2018-08-30 MED ORDER — SODIUM CHLORIDE 0.9 % IV SOLN
INTRAVENOUS | Status: DC | PRN
  Administered 2018-08-30: 10:00:00 via INTRAVENOUS

## 2018-08-30 MED ORDER — ACETAMINOPHEN 325 MG PO TABS
650.0000 mg | ORAL_TABLET | Freq: Four times a day (QID) | ORAL | Status: DC | PRN
  Administered 2018-08-31 – 2018-09-06 (×6): 650 mg via ORAL
  Filled 2018-08-30 (×4): qty 2
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
## 2018-08-30 NOTE — Progress Notes (Signed)
Recreation Therapy Notes  INPATIENT RECREATION THERAPY ASSESSMENT  Patient Details Name: Luciana Illes MRN: [id number] DOB: [birth year] Today's Date: 08/30/2018       Information Obtained From: Patient  Able to Participate in Assessment/Interview: Yes  Patient Presentation: Responsive  Reason for Admission (Per Patient): Active Symptoms, Other (Comments)(Depression)  Patient Stressors: Death  Coping Skills:   Talk, Other (Comment)(Keep pushing myself)  Leisure Interests (2+):  Individual - Phone, Social - Friends, Community - Orthoptist, Garment/textile technologist - Technical brewer of Recreation/Participation: Chief Executive Officer of Community Resources:  Yes  Community Resources:  [employer], Engineering geologist  Current Use:    If no, Barriers?:    Expressed Interest in [employer] Information:    [place] of Residence:  [place]  Patient Main Form of Transportation: Therapist, music  Patient Strengths:  Loyal  Patient Identified Areas of Improvement:  Learn how to deal with depression  Patient Goal for Hospitalization:  To feel better  Current SI (including self-harm):  No  Current HI:  No  Current AVH: No  Staff Intervention Plan: Group Attendance, Collaborate with Interdisciplinary Treatment Team  Consent to Intern Participation: N/A  Roizy Harold 08/30/2018, 1:57 PM

## 2018-08-30 NOTE — Progress Notes (Signed)
Recreation Therapy Notes  Date: 08/30/2018  Time: 9:30 am   Location: Craft room   Behavioral response: N/A   Intervention Topic: Leisure   Discussion/Intervention: Patient did not attend group.   Clinical Observations/Feedback:  Patient did not attend group.   Simcha Speir LRT/CTRS        Stevee Valenta 08/30/2018 10:54 AM
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
## 2018-08-30 NOTE — Anesthesia Post-op Follow-up Note (Signed)
Anesthesia QCDR form completed.        

## 2018-08-30 NOTE — [facility] Suicide Risk Assessment (Signed)
[facility] INPATIENT:  Family/Significant Other Suicide Prevention Education  Suicide Prevention Education:  Patient Refusal for Family/Significant Other Suicide Prevention Education: The patient Julia Wheeler has refused to provide written consent for family/significant other to be provided Family/Significant Other Suicide Prevention Education during admission and/or prior to discharge.  Physician notified.  [name] 08/30/2018, 11:49 AM

## 2018-08-30 NOTE — [facility] Group Notes (Signed)
Feelings Around Relapse 08/30/2018 1PM  Type of Therapy and Topic:  Group Therapy:  Feelings around Relapse and Recovery  Participation Level:  Did Not Attend   Description of Group:    Patients in this group will discuss emotions they experience before and after a relapse. They will process how experiencing these feelings, or avoidance of experiencing them, relates to having a relapse. Facilitator will guide patients to explore emotions they have related to recovery. Patients will be encouraged to process which emotions are more powerful. They will be guided to discuss the emotional reaction significant others in their lives may have to patients' relapse or recovery. Patients will be assisted in exploring ways to respond to the emotions of others without this contributing to a relapse.  Therapeutic Goals: 1. Patient will identify two or more emotions that lead to a relapse for them 2. Patient will identify two emotions that result when they relapse 3. Patient will identify two emotions related to recovery 4. Patient will demonstrate ability to communicate their needs through discussion and/or role plays   Summary of Patient Progress:  Patient was encouraged and invited to attend group. Patient did not attend group. Social worker will continue to encourage group participation in the future.      Therapeutic Modalities:   Cognitive Behavioral Therapy Solution-Focused Therapy Assertiveness Training Relapse Prevention Therapy   Suzan Slick, LCSW 08/30/2018 3:02 PM

## 2018-08-30 NOTE — [facility] Group Notes (Signed)
[facility] Group Notes:  (Nursing/MHT/Case Management/Adjunct)  Date:  08/30/2018  Time:  11:09 PM  Type of Therapy:  Group Therapy  Participation Level:  Active  Participation Quality:  Appropriate  Affect:  Appropriate  Cognitive:  Appropriate  Insight:  Appropriate  Engagement in Group:  Engaged  Modes of Intervention:  Support  Summary of Progress/Problems:  Julia Wheeler 08/30/2018, 11:09 PM

## 2018-08-30 NOTE — Progress Notes (Signed)
Patient returned to unit after having ECT procedure done. Patient alert and oriented x 4. Ambulatory with a steady gait. C/O headache with a pain score of an 8 and a goal of a 0. Medicated with Tylenol, food and po fluids provided to patient. Ate meal without difficulty, after meal pt observed resting in bed with eyes closed. Will continue to monitor.
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
## 2018-08-30 NOTE — Progress Notes (Signed)
Initial Nutrition Assessment  DOCUMENTATION CODES:   Not applicable  INTERVENTION:   Premier Protein BID, each supplement provides 160 kcal and 30 grams of protein.   MVI daily   Pt at high risk for nutrient deficiencies r/t her gastric surgery. Recommend check thiamine, B12, folate, iron, zinc, copper, and vitamins C, D, A, E, & K.   NUTRITION DIAGNOSIS:   Increased nutrient needs related to altered GI function(h/o roux-en-y gastric bypass ) as evidenced by estimated needs.  GOAL:   Patient will meet greater than or equal to 90% of their needs  MONITOR:   PO intake, Supplement acceptance  REASON FOR ASSESSMENT:   Malnutrition Screening Tool    ASSESSMENT:   52 y/o with h/o multiple abdominal surgeries, roux-en-y 2005, depression admitted for ECT treatment    Pt s/p gastric bypass 2005; s/p diag lap 2008 for SBO, incarcerated internal hernias x2; s/p diag lap 2015 for possible jejunal intussusception. S/p diagnostic laparoscopy on 11/11/2017 for intususeption.   Patient is well known to nutrition department from multiple previous admits. Pt was last seen by this RD in January. Pt reports poor appetite and weight loss at every admission. Pt believes that her poor appetite is r/t her depression and at times has reported intermittent sharp abdominal pains in her left, upper abdomen. Pt reports being a "grazer" and eating multiple small meals at baseline. Pt does report taking a daily MVI when she remembers and drinking chocolate Boost supplements daily. Pt has reported on previous admissions that she can't tolerate milky substances and prefers Boost Breeze. Pt does not take her iron supplements regularly. Per chart, pt's weight is stable since January. Pt s/p multiple small bowel surgeries including roux-en-y in 2005. Pt at high risk for nutrient deficiencies r/t her gastric surgery. Recommend check thiamine, B12, folate, iron, zinc, copper, and vitamins C, D, A, E, & K. Some  nutrient deficiencies can cause severe depression.   Medications reviewed and include:   Labs reviewed:   Diet Order:   Diet Order            Diet NPO time specified  Diet effective midnight        Diet regular Room service appropriate? Yes; Fluid consistency: Thin  Diet effective now             EDUCATION NEEDS:   Education needs have been addressed  Skin:  Skin Assessment: Reviewed RN Assessment  Last BM:  pta  Height:   Ht Readings from Last 1 Encounters:  08/30/18 5\' 7"  (1.702 m)    Weight:   Wt Readings from Last 1 Encounters:  08/30/18 72.6 kg    Ideal Body Weight:  61.36 kg  BMI:  Body mass index is 25.06 kg/m.  Estimated Nutritional Needs:   Kcal:  1600-1800kcal/day   Protein:  73-80g/day   Fluid:  >1.6L/day   Betsey Holiday MS, RD, LDN Pager #- [phone] Office#- [phone] After Hours Pager: [phone]

## 2018-08-30 NOTE — Procedures (Signed)
ECT SERVICES Physician's Interval Evaluation & Treatment Note  Patient Identification: Julia Wheeler MRN:  [id number] Date of Evaluation:  08/30/2018 TX #: 1  MADRS:   MMSE:   P.E. Findings:  Patient's blood pressure and vitals are stable.  No major medical finding lungs clear.  Psychiatric Interval Note:  Depressed down not psychotic cooperative  Subjective:  Patient is a 52 y.o. female seen for evaluation for Electroconvulsive Therapy. Depressed mood  Treatment Summary:   []   Right Unilateral             []  Bilateral   % Energy : 1.0 ms 25%   Impedance: 1710 ohms  Seizure Energy Index: 5734 V squared  Postictal Suppression Index: 85%  Seizure Concordance Index: 90%  Medications  Pre Shock: Brevital succinylcholine  Post Shock:    Seizure Duration: 32 seconds by EMG 52 seconds by EEG   Comments: Follow-up Monday  Lungs:  [x]   Clear to auscultation               []  Other:   Heart:    [x]   Regular rhythm             []  irregular rhythm    [x]   Previous H&P reviewed, patient examined and there are NO CHANGES                 []   Previous H&P reviewed, patient examined and there are changes noted.   Mordecai Rasmussen, MD 10/18/20193:45 PM

## 2018-08-30 NOTE — Progress Notes (Signed)
Patient went to sleep at bed time. Slept throughout the night and appeared to be comfortable in bed. Currently awake  Expressing readiness for ECT today. CBG 84. Education and encouragements provided. Safety maintained.

## 2018-08-30 NOTE — Transfer of Care (Signed)
Immediate Anesthesia Transfer of Care Note  Patient: Julia Wheeler  Procedure(s) Performed: ECT TX  Patient Location: PACU  Anesthesia Type:General  Level of Consciousness: drowsy  Airway & Oxygen Therapy: Patient connected to face mask oxygen  Post-op Assessment: Post -op Vital signs reviewed and stable  Post vital signs: stable  Last Vitals:  Vitals Value Taken Time  BP 142/96 08/30/2018 10:20 AM  Temp 36.9 C 08/30/2018 10:20 AM  Pulse 88 08/30/2018 10:20 AM  Resp 18 08/30/2018 10:21 AM  SpO2 100 % 08/30/2018 10:20 AM  Vitals shown include unvalidated device data.  Last Pain:  Vitals:   08/30/18 1020  TempSrc: Temporal  PainSc:          Complications: No apparent anesthesia complications

## 2018-08-30 NOTE — Tx Team (Signed)
Interdisciplinary Treatment and Diagnostic Plan Update  08/30/2018 Time of Session: 2PM Julia Wheeler MRN: [id number]  Principal Diagnosis: Severe recurrent major depression without psychotic features [facility])  Secondary Diagnoses: Principal Problem:   Severe recurrent major depression without psychotic features [facility]) Active Problems:   Essential hypertension   Chronic left systolic heart failure (HCC)   Current Medications:  Current Facility-Administered Medications  Medication Dose Route Frequency Provider Last Rate Last Dose  . acetaminophen (TYLENOL) tablet 650 mg  650 mg Oral Q6H PRN Clapacs, [name], MD      . acetaminophen (TYLENOL) tablet 650 mg  650 mg Oral Q6H PRN Clapacs, [name], MD      . alum & mag hydroxide-simeth (MAALOX/MYLANTA) 200-200-20 MG/5ML suspension 30 mL  30 mL Oral Q4H PRN Clapacs, [name], MD      . amitriptyline (ELAVIL) tablet 25 mg  25 mg Oral QHS Pucilowska, Jolanta B, MD   25 mg at 08/29/18 2226  . amLODipine (NORVASC) tablet 5 mg  5 mg Oral Daily Clapacs, Jackquline Denmark, MD   5 mg at 08/30/18 0745  . atorvastatin (LIPITOR) tablet 10 mg  10 mg Oral q1800 Clapacs, [name], MD      . ibuprofen (ADVIL,MOTRIN) tablet 600 mg  600 mg Oral Q6H PRN Clapacs, [name], MD      . magnesium hydroxide (MILK OF MAGNESIA) suspension 30 mL  30 mL Oral Daily PRN Clapacs, [name], MD      . metoprolol tartrate (LOPRESSOR) tablet 12.5 mg  12.5 mg Oral BID Clapacs, [name], MD   12.5 mg at 08/30/18 0745  . [START ON 08/31/2018] multivitamin with minerals tablet 1 tablet  1 tablet Oral Daily Clapacs, [name], MD      . ondansetron (ZOFRAN) injection 4 mg  4 mg Intravenous Once PRN [name], MD      . ondansetron [facility]) tablet 4 mg  4 mg Oral Q8H PRN Clapacs, Jackquline Denmark, MD      . Melene Muller ON 08/31/2018] protein supplement (PREMIER PROTEIN) liquid - approved for s/p bariatric surgery  11 oz Oral BID BM Clapacs, Jackquline Denmark, MD       PTA Medications: Medications Prior to Admission   Medication Sig Dispense Refill Last Dose  . acetaminophen (TYLENOL) 325 MG tablet Take 2 tablets (650 mg total) by mouth every 6 (six) hours as needed for mild pain or headache.   08/29/2018  . amLODipine (NORVASC) 5 MG tablet Take 1 tablet (5 mg total) by mouth daily. For high blood pressure   08/29/2018  . atorvastatin (LIPITOR) 10 MG tablet Take 1 tablet (10 mg total) by mouth at bedtime. For high cholesterol 30 tablet 1 08/29/2018  . feeding supplement, ENSURE ENLIVE, (ENSURE ENLIVE) LIQD Take 237 mLs by mouth 2 (two) times daily between meals. (Nutritional supplement). 237 mL 12 08/29/2018  . metoprolol tartrate (LOPRESSOR) 25 MG tablet Take 0.5 tablets (12.5 mg total) by mouth 2 (two) times daily. For high blood pressure   08/30/2018 at Unknown time  . Multiple Vitamin (MULTIVITAMIN WITH MINERALS) TABS tablet Take 1 tablet by mouth daily. 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   Physician Treatment Plan for Primary Diagnosis:  Severe recurrent major depression without psychotic features (HCC) Long Term Goal(s): Improvement in symptoms so as ready for discharge Improvement in symptoms so as ready for discharge   Short Term Goals: Ability to disclose and discuss suicidal ideas Ability to demonstrate self-control will improve Ability to maintain clinical measurements within normal limits will improve  Medication Management: Evaluate patient's response, side effects, and tolerance of medication regimen.  Therapeutic Interventions: 1 to 1 sessions, Unit Group sessions and Medication administration.  Evaluation of  Outcomes: Progressing  Physician Treatment Plan for Secondary Diagnosis: Principal Problem:   Severe recurrent major depression without psychotic features (HCC) Active Problems:   Essential hypertension   Chronic left systolic heart failure (HCC)  Long Term Goal(s): Improvement in symptoms so as ready for discharge Improvement in symptoms so as ready for discharge   Short Term Goals: Ability to disclose and discuss suicidal ideas Ability to demonstrate self-control will improve Ability to maintain clinical measurements within normal limits will improve     Medication Management: Evaluate patient's response, side effects, and tolerance of medication regimen.  Therapeutic Interventions: 1 to 1 sessions, Unit Group sessions and Medication administration.  Evaluation of Outcomes: Progressing    RN Treatment Plan for Primary Diagnosis: Severe recurrent major depression without psychotic features (HCC) Long Term Goal(s): Knowledge of disease and therapeutic regimen to maintain health will improve  Short Term Goals: Ability to disclose and discuss suicidal ideas, Ability to identify and develop effective coping behaviors will improve and Compliance with prescribed medications will improve  Medication Management: RN will administer medications as ordered by provider, will assess and evaluate patient's response and provide education to patient for prescribed medication. RN will report any adverse and/or side effects to prescribing provider.  Therapeutic Interventions: 1 on 1 counseling sessions, Psychoeducation, Medication administration, Evaluate responses to treatment, Monitor vital signs and CBGs as ordered, Perform/monitor CIWA, COWS, AIMS and Fall Risk screenings as ordered, Perform wound care treatments as ordered.  Evaluation of Outcomes: Progressing   LCSW Treatment Plan for Primary Diagnosis: Severe recurrent major depression without psychotic features (HCC) Long Term Goal(s): Safe  transition to appropriate next level of care at discharge, Engage patient in therapeutic group addressing interpersonal concerns.  Short Term Goals: Engage patient in aftercare planning with referrals and resources, Increase social support, Increase emotional regulation and Increase skills for wellness and recovery  Therapeutic Interventions: Assess for all discharge needs, 1 to 1 time with Social worker, Explore available resources and support systems, Assess for adequacy in community support network, Educate family and significant other(s) on suicide prevention, Complete Psychosocial Assessment, Interpersonal group therapy.  Evaluation of Outcomes: Progressing   Progress in Treatment: Attending groups: No. Participating in groups: No. Taking medication as prescribed: Yes. Toleration medication: Yes. Family/Significant other contact made: No, will contact:  Patient refused Patient understands diagnosis: Yes. Discussing patient identified problems/goals with staff: Yes. Medical problems stabilized or resolved: Yes. Denies suicidal/homicidal ideation: Yes. Issues/concerns per patient self-inventory: No. Other:   New problem(s) identified: No, Describe:  None  New Short Term/Long Term Goal(s): "To find a way to realize when I am getting depressed and find a way to not allow it to overtake me."  Patient Goals:  "To find a way to realize when I am getting depressed and find a way to not allow it to overtake me."   Discharge Plan or Barriers: To discharge home and follow up with [facility].  Reason for Continuation of Hospitalization: Depression Medication stabilization  Estimated Length of Stay:  7 days  Attendees: Patient: Julia Wheeler 08/30/2018 3:19 PM  Physician: Mordecai Rasmussen, MD 08/30/2018 3:19 PM  Nursing:  08/30/2018 3:19 PM  RN Care Manager: 08/30/2018 3:19 PM  Social Worker: [name], LCSWA 08/30/2018 3:19 PM  Recreational Therapist:  08/30/2018 3:19 PM   Other: Lowella Dandy, LCSW 08/30/2018 3:19 PM  Other:  08/30/2018 3:19 PM  Other: 08/30/2018 3:19 PM    Scribe for Treatment Team: [name], LCSW 08/30/2018 3:19 PM

## 2018-08-30 NOTE — Plan of Care (Signed)
Patient alert and oriented x 4. ECT procedure performed today with minimal side effects noted. Complained of headache when she arrived back to the unit from the procedure. PRN Tylenol administered with effectiveness. Patient up in the milieu intermittently for meals. Rates her depression and hopelessness today an 8. Rates her anxiety a 7. Milieu remains safe with q 15 minute safety checks. Will continue to monitor.

## 2018-08-30 NOTE — Anesthesia Postprocedure Evaluation (Signed)
Anesthesia Post Note  Patient: Julia Wheeler  Procedure(s) Performed: ECT TX  Patient location during evaluation: PACU Anesthesia Type: General Level of consciousness: sedated Pain management: pain level controlled Vital Signs Assessment: post-procedure vital signs reviewed and stable Respiratory status: spontaneous breathing and respiratory function stable Cardiovascular status: stable Anesthetic complications: no     Last Vitals:  Vitals:   08/30/18 0842 08/30/18 1020  BP: 135/78 (!) 142/96  Pulse: 75 88  Resp:  19  Temp: 36.6 C 36.9 C  SpO2: 98% 100%    Last Pain:  Vitals:   08/30/18 1020  TempSrc: Temporal  PainSc: 0-No pain                 Kennidy Lamke K

## 2018-08-30 NOTE — Anesthesia Preprocedure Evaluation (Signed)
Anesthesia Evaluation  Patient identified by MRN, date of birth, ID band Patient awake    Reviewed: Allergy & Precautions, NPO status , Patient's Chart, lab work & pertinent test results  History of Anesthesia Complications Negative for: history of anesthetic complications  Airway Mallampati: II  TM Distance: >3 FB Neck ROM: Full    Dental  (+) Poor Dentition   Pulmonary neg pulmonary ROS, neg sleep apnea, neg COPD,    breath sounds clear to auscultation- rhonchi (-) wheezing      Cardiovascular hypertension, Pt. on medications +CHF (NICM)  (-) CAD, (-) Past MI, (-) Cardiac Stents and (-) CABG  Rhythm:Regular Rate:Normal - Systolic murmurs and - Diastolic murmurs Loop recorder implanted since last ECT for syncopal episodes.   Neuro/Psych  Headaches, PSYCHIATRIC DISORDERS Anxiety Depression    GI/Hepatic Neg liver ROS, PUD, GERD  Medicated,  Endo/Other  negative endocrine ROSneg diabetes  Renal/GU negative Renal ROS     Musculoskeletal  (+) Arthritis ,   Abdominal (+) - obese,   Peds  Hematology  (+) anemia ,   Anesthesia Other Findings   Reproductive/Obstetrics                             Anesthesia Physical  Anesthesia Plan  ASA: III  Anesthesia Plan: General   Post-op Pain Management:    Induction: Intravenous  PONV Risk Score and Plan: 2 and Ondansetron  Airway Management Planned: Mask  Additional Equipment:   Intra-op Plan:   Post-operative Plan:   Informed Consent: I have reviewed the patients History and Physical, chart, labs and discussed the procedure including the risks, benefits and alternatives for the proposed anesthesia with the patient or authorized representative who has indicated his/her understanding and acceptance.     Plan Discussed with: CRNA and Anesthesiologist  Anesthesia Plan Comments:         Anesthesia Quick Evaluation

## 2018-08-31 NOTE — Plan of Care (Signed)
Data: Patient is appropriate and cooperative to assessment. Patient denies SI/HI/AVH. Patient has completed daily self inventory worksheet. Patient has no complaints and a pain rating of 0/10. Patient reports fair sleep quality, appetite is fair for the last 24 hours. Patient rates depression "7/10" , feelings of hopelessness "7/10" and anxiety "7/10" Patients goal for today is "Bringing my mood up."  Action:  Q x 15 minute observation checks were completed for safety. Patient was provided with education on medications. Patient was offered support and encouragement. Patient was given scheduled medications. Patient  was encourage to attend groups, participate in unit activities and continue with plan of care.     Response: Patient is compliant with medication and is seen in the milieu. Patient has no complaints at this time. Patient is receptive to treatment and safety maintained on unit.    Problem: Activity: Goal: Interest or engagement in activities will improve Outcome: Progressing   Problem: Safety: Goal: Ability to remain free from injury will improve Outcome: Progressing   Problem: Self-Concept: Goal: Will verbalize positive feelings about self Outcome: Not Progressing

## 2018-08-31 NOTE — [facility] Group Notes (Signed)
LCSW Group Therapy Note  08/31/2018 1:15pm  Type of Therapy and Topic:  Group Therapy:  Cognitive Distortions  Participation Level:  Did Not Attend   Description of Group:    Patients in this group will be introduced to the topic of cognitive distortions.  Patients will identify and describe cognitive distortions, describe the feelings these distortions create for them.  Patients will identify one or more situations in their personal life where they have cognitively distorted thinking and will verbalize challenging this cognitive distortion through positive thinking skills.  Patients will practice the skill of using positive affirmations to challenge cognitive distortions using affirmation cards.    Therapeutic Goals:  1. Patient will identify two or more cognitive distortions they have used 2. Patient will identify one or more emotions that stem from use of a cognitive distortion 3. Patient will demonstrate use of a positive affirmation to counter a cognitive distortion through discussion and/or role play. 4. Patient will describe one way cognitive distortions can be detrimental to wellness   Summary of Patient Progress:Pt was invited to attend group but chose not to attend. CSW will continue to encourage pt to attend group throughout their admission.      Therapeutic Modalities:   Cognitive Behavioral Therapy Motivational Interviewing   Iver Miklas  [name], LCSW 08/31/2018 10:34 AM

## 2018-08-31 NOTE — Plan of Care (Signed)
  Problem: Coping: Goal: Coping ability will improve Outcome: Progressing  Patient is coping appropriately, interacting with peers and staff.

## 2018-08-31 NOTE — Progress Notes (Signed)
Patient alert and oriented x 4, denies SI/HI/AVH, S/P ECT no adverse reaction noted, no memory loss, denies pain or discomfort, interacting appropriately with peers and staff. Patient appers less anxious, making appropriate eye contact, thoughts are organized and coherent, receptive to treatment plan and complaint with evening medication regimen .15 minutes safety checks maintained, will continue to monitor closely.
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
## 2018-09-01 NOTE — Plan of Care (Signed)
Patient stated that she was feeling better this evening and that she had a good day. Patient stated that she wasn't going to let others affect how she feels. Patient stated that she was excited about ECT therapy.   Problem: Education: Goal: Emotional status will improve Outcome: Progressing   Problem: Self-Concept: Goal: Will verbalize positive feelings about self Outcome: Progressing

## 2018-09-01 NOTE — BHH Group Notes (Signed)
LCSW Group Therapy Note 09/01/2018 1:15pm  Type of Therapy and Topic: Group Therapy: Feelings Around Returning Home & Establishing a Supportive Framework and Supporting Oneself When Supports Not Available  Participation Level: Active  Description of Group:  Patients first processed thoughts and feelings about upcoming discharge. These included fears of upcoming changes, lack of change, new living environments, judgements and expectations from others and overall stigma of mental health issues. The group then discussed the definition of a supportive framework, what that looks and feels like, and how do to discern it from an unhealthy non-supportive network. The group identified different types of supports as well as what to do when your family/friends are less than helpful or unavailable  Therapeutic Goals  1. Patient will identify one healthy supportive network that they can use at discharge. 2. Patient will identify one factor of a supportive framework and how to tell it from an unhealthy network. 3. Patient able to identify one coping skill to use when they do not have positive supports from others. 4. Patient will demonstrate ability to communicate their needs through discussion and/or role plays.  Summary of Patient Progress:  The patient reported she feels "alright." Pt engaged during group session. As patients processed their anxiety about discharge and described healthy supports patient shared she is not ready to be discharge, she needs more ECT treatments.  Patients identified at least one self-care tool they were willing to use after discharge.   Therapeutic Modalities Cognitive Behavioral Therapy Motivational Interviewing   Julia Wheeler  [name], LCSW 09/01/2018 12:02 PM

## 2018-09-01 NOTE — Progress Notes (Signed)

## 2018-09-01 NOTE — Plan of Care (Signed)
Patient is improving adequately and adjusting nicely in the unit , participating in group activities with peers, patient is  feeling good about herself and sleeping long hours at night without any interruptions , compliant with medication regimen, voice no concerns regarding her health, appetite is good , denies any SI/HI and no signs of AVH, 15 minute safety rounds in progress.   Problem: Education: Goal: Knowledge of [facility] Education information/materials will improve Outcome: Progressing Goal: Emotional status will improve Outcome: Progressing   Problem: Activity: Goal: Interest or engagement in activities will improve Outcome: Progressing   Problem: Coping: Goal: Coping ability will improve Outcome: Progressing   Problem: Health Behavior/Discharge Planning: Goal: Compliance with therapeutic regimen will improve Outcome: Progressing   Problem: Self-Concept: Goal: Will verbalize positive feelings about self Outcome: Progressing   Problem: Safety: Goal: Ability to remain free from injury will improve Outcome: Progressing

## 2018-09-02 ENCOUNTER — Inpatient Hospital Stay: Payer: No Typology Code available for payment source | Admitting: Anesthesiology

## 2018-09-02 ENCOUNTER — Inpatient Hospital Stay: Payer: No Typology Code available for payment source

## 2018-09-02 ENCOUNTER — Other Ambulatory Visit: Payer: Self-pay | Admitting: Psychiatry

## 2018-09-02 LAB — GLUCOSE, CAPILLARY
Glucose-Capillary: 74 mg/dL (ref 70–99)
Glucose-Capillary: 72 mg/dL (ref 70–99)

## 2018-09-02 MED ORDER — SODIUM CHLORIDE 0.9 % IV SOLN
500.0000 mL | Freq: Once | INTRAVENOUS | Status: AC
  Administered 2018-09-02: 500 mL via INTRAVENOUS

## 2018-09-02 MED ORDER — METHOHEXITAL SODIUM 100 MG/10ML IV SOSY
PREFILLED_SYRINGE | INTRAVENOUS | Status: DC | PRN
  Administered 2018-09-02: 60 mg via INTRAVENOUS

## 2018-09-02 MED ORDER — SODIUM CHLORIDE 0.9 % IV SOLN
INTRAVENOUS | Status: DC | PRN
  Administered 2018-09-02: 10:00:00 via INTRAVENOUS

## 2018-09-02 MED ORDER — SUCCINYLCHOLINE CHLORIDE 200 MG/10ML IV SOSY
PREFILLED_SYRINGE | INTRAVENOUS | Status: DC | PRN
  Administered 2018-09-02: 80 mg via INTRAVENOUS

## 2018-09-02 NOTE — BHH Group Notes (Signed)
B[facility] Group Notes:  (Nursing/MHT/Case Management/Adjunct)  Date:  09/02/2018  Time:  11:26 PM  Type of Therapy:  Group Therapy  Participation Level:  Active  Participation Quality:  Appropriate  Affect:  Appropriate  Cognitive:  Appropriate  Insight:  Appropriate  Engagement in Group:  Engaged  Modes of Intervention:  Discussion  Summary of Progress/Problems:  Julia Wheeler 09/02/2018, 11:26 PM

## 2018-09-02 NOTE — Progress Notes (Signed)
D - Patient denies SI/HI/AVH. Patient contracts for safety. Patient denied anxiety but stated her depression was a "6/10" Patient was in the dayroom watching TV this evening. Patient was calm and cooperative with this Clinical research associate. Patient was ambulatory on the unit today and stated that "not using her walker was part of what made her day good".   A - Patient was pleasant during assessment and medication administration per MD orders. Patient was oriented to the unit. Safety checks were performed Q 15 minutes as per unit protocol. Patient was offered support and encouragement. Patient was compliant with medications and continues with the plan of care.   R - Patient has no complaints of pain at this time. Patient is receptive to treatment and safety maintained on unit.
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
## 2018-09-02 NOTE — Progress Notes (Signed)
Recreation Therapy Notes  Date: 09/02/2018  Time: 9:30 am   Location: Craft room   Behavioral response: N/A   Intervention Topic: Stress  Discussion/Intervention: Patient did not attend group.   Clinical Observations/Feedback:  Patient did not attend group.   Sandee Bernath LRT/CTRS        Vahe Pienta 09/02/2018 12:26 PM
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
## 2018-09-02 NOTE — Transfer of Care (Signed)
Immediate Anesthesia Transfer of Care Note  Patient: Julia Wheeler  Procedure(s) Performed: ECT TX  Patient Location: PACU  Anesthesia Type:General  Level of Consciousness: sedated  Airway & Oxygen Therapy: Patient Spontanous Breathing and Patient connected to face mask oxygen  Post-op Assessment: Report given to RN and Post -op Vital signs reviewed and stable  Post vital signs: Reviewed and stable  Last Vitals:  Vitals Value Taken Time  BP 135/101 09/02/2018 10:35 AM  Temp    Pulse    Resp 19 09/02/2018 10:36 AM  SpO2    Vitals shown include unvalidated device data.  Last Pain:  Vitals:   09/02/18 0952  TempSrc:   PainSc: 0-No pain      Patients Stated Pain Goal: 2 (09/01/18 2201)  Complications: No apparent anesthesia complications

## 2018-09-02 NOTE — Anesthesia Postprocedure Evaluation (Signed)
Anesthesia Post Note  Patient: Julia Wheeler  Procedure(s) Performed: ECT TX  Patient location during evaluation: Endoscopy Anesthesia Type: General Level of consciousness: awake and alert Pain management: pain level controlled Vital Signs Assessment: post-procedure vital signs reviewed and stable Respiratory status: spontaneous breathing, nonlabored ventilation, respiratory function stable and patient connected to nasal cannula oxygen Cardiovascular status: blood pressure returned to baseline and stable Postop Assessment: no apparent nausea or vomiting Anesthetic complications: no     Last Vitals:  Vitals:   09/02/18 1048 09/02/18 1108  BP: (!) 135/93   Pulse:    Resp:    Temp:  37.1 C  SpO2:      Last Pain:  Vitals:   09/02/18 1108  TempSrc:   PainSc: 0-No pain                 Cleda Mccreedy Piscitello

## 2018-09-02 NOTE — Procedures (Signed)
ECT SERVICES Physician's Interval Evaluation & Treatment Note  Patient Identification: Julia Wheeler MRN:  [id number] Date of Evaluation:  09/02/2018 TX #: 2  MADRS:   MMSE:   P.E. Findings:  No change to physical exam  Psychiatric Interval Note:  Depressed but not nearly as down as before  Subjective:  Patient is a 52 y.o. female seen for evaluation for Electroconvulsive Therapy. Mild depression not suicidal  Treatment Summary:   []   Right Unilateral             [x]  Bilateral   % Energy : 1.0 ms 25%   Impedance: 1850 ohms  Seizure Energy Index: 9780 V squared  Postictal Suppression Index: 78%  Seizure Concordance Index: 98%  Medications  Pre Shock: Zofran 4 mg Brevital 60 mg succinylcholine 80 mg  Post Shock:    Seizure Duration: 49 seconds by EMG 77 seconds by EEG   Comments: Follow-up on Wednesday  Lungs:  [x]   Clear to auscultation               []  Other:   Heart:    [x]   Regular rhythm             []  irregular rhythm    [x]   Previous H&P reviewed, patient examined and there are NO CHANGES                 []   Previous H&P reviewed, patient examined and there are changes noted.   Mordecai Rasmussen, MD 10/21/201910:29 AM

## 2018-09-02 NOTE — Plan of Care (Addendum)
D: Pt denies SI/HI/AV hallucinations. Pt is pleasant and cooperative. Pt goal for today is to work on her coping skills. Patient was very talkative with this Clinical research associate. Patient shared she had been depress for sometime and that she wants to be better when she goes home. Patient rates her depression 6/10, hopelessness 5/10 and anxiety 6/10. A: Pt was offered support and encouragement. Pt was given scheduled medications. Pt was encourage to attend groups. Q 15 minute checks were done for safety.  R: Pt is taking medication. Pt has no complaints.Pt receptive to treatment and safety maintained on unit.    Problem: Coping: Goal: Coping ability will improve Outcome: Progressing   Problem: Health Behavior/Discharge Planning: Goal: Compliance with therapeutic regimen will improve Outcome: Progressing   Problem: Self-Concept: Goal: Will verbalize positive feelings about self Outcome: Progressing   Problem: Safety: Goal: Ability to remain free from injury will improve Outcome: Progressing

## 2018-09-02 NOTE — Anesthesia Procedure Notes (Signed)
Date/Time: 09/02/2018 10:23 AM Performed by: Lily Kocher, CRNA Pre-anesthesia Checklist: Patient identified, Emergency Drugs available, Suction available and Patient being monitored Patient Re-evaluated:Patient Re-evaluated prior to induction Oxygen Delivery Method: Circle system utilized Preoxygenation: Pre-oxygenation with 100% oxygen Induction Type: IV induction Ventilation: Mask ventilation without difficulty and Mask ventilation throughout procedure Airway Equipment and Method: Bite block Placement Confirmation: positive ETCO2 Dental Injury: Teeth and Oropharynx as per pre-operative assessment

## 2018-09-02 NOTE — Anesthesia Post-op Follow-up Note (Signed)
Anesthesia QCDR form completed.        
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
## 2018-09-03 ENCOUNTER — Other Ambulatory Visit: Payer: Self-pay | Admitting: Psychiatry

## 2018-09-03 NOTE — [facility] Group Notes (Signed)
  LCSW Group Therapy Note  Tuesday Oct 22/19 at 1:00pm  Type of Therapy/Topic:  Group Therapy:  Feelings about Diagnosis  Participation Level:  Patient did attend group  Description of Group:   This group will allow patients to explore their thoughts and feelings about diagnoses they have received. Patients will be guided to explore their level of understanding and acceptance of these diagnoses. Facilitator will encourage patients to process their thoughts and feelings about the reactions of others to their diagnosis and will guide patients in identifying ways to discuss their diagnosis with significant others in their lives. This group will be process-oriented, with patients participating in exploration of their own experiences, giving and receiving support, and processing challenge from other group members.   Therapeutic Goals: 1. Patient will demonstrate understanding of diagnosis as evidenced by identifying two or more symptoms of the disorder 2. Patient will be able to express two feelings regarding the diagnosis 3. Patient will demonstrate their ability to communicate their needs through discussion and/or role play  Summary of Patient Progress:  This patient had excellent participation in group and disclosed her struggle with suicide and depression. She had entire group mesmerized on her content and personal struggles. Several group members were very touched by her truth.    Therapeutic Modalities:   Cognitive Behavioral Therapy Brief Therapy Feelings Identification    Dequita Schleicher LCSW [id number]

## 2018-09-03 NOTE — Plan of Care (Addendum)
Patient found in day room upon my arrival. Patient is visible and slightly social this evening. Denies SI/HI/AVH at this time. Complains of back and jaw pain, given Motrin with positive results. Reports eating and voiding adequately. Compliant with HS medications and staff direction. Attends group. Q 15 minute checks maintained. Will continue to monitor throughout the shift. Patient given Trazodone for sleep with positive results. Slept 7.75 hours. No apparent distress. Will endorse care to oncoming shift.  Problem: Education: Goal: Emotional status will improve Outcome: Progressing   Problem: Coping: Goal: Coping ability will improve Outcome: Progressing   Problem: Health Behavior/Discharge Planning: Goal: Compliance with therapeutic regimen will improve Outcome: Progressing   Problem: Safety: Goal: Ability to remain free from injury will improve Outcome: Progressing

## 2018-09-03 NOTE — Progress Notes (Signed)
Recreation Therapy Notes   Date: 09/03/2018  Time: 9:30 am  Location: Craft Room  Behavioral response: Appropriate  Intervention Topic: Emotions  Discussion/Intervention:  Group content on today was focused on emotions. The group identified what emotions are and why it is important to have emotions. Patients expressed some positive and negative emotions. Individuals gave some past experiences on how they normally dealt with emotions in the past. The group described some positive ways to deal with emotions in the future. Patients participated in the intervention "The Situation" where individuals were given a chance to respond to certain situations involving their emotions.  Clinical Observations/Feedback:  Patient came to group late due to unknown reasons. She stated that she sometimes act on her emotions and takes action before thinking. Individual identified herself as an extremist. She is either exteremly happy or extremely sad; there is no happy medium. Participant was social with peers and staff while participating in the intervention.  Mekaylah Klich LRT/CTRS          Dariya Gainer 09/03/2018 11:34 AM

## 2018-09-03 NOTE — Progress Notes (Signed)
Received Julia Wheeler this AM after breakfast, she was compliant with her medications. She continues to endorse feeling depressed, but her affect is a little brighter since her admission. She is OOB in the milieu at intervals throughout the morning.

## 2018-09-04 ENCOUNTER — Inpatient Hospital Stay: Payer: No Typology Code available for payment source | Admitting: Anesthesiology

## 2018-09-04 ENCOUNTER — Inpatient Hospital Stay: Payer: No Typology Code available for payment source

## 2018-09-04 LAB — GLUCOSE, CAPILLARY
Glucose-Capillary: 107 mg/dL — ABNORMAL HIGH (ref 70–99)
Glucose-Capillary: 68 mg/dL — ABNORMAL LOW (ref 70–99)
Glucose-Capillary: 65 mg/dL — ABNORMAL LOW (ref 70–99)
Glucose-Capillary: 134 mg/dL — ABNORMAL HIGH (ref 70–99)

## 2018-09-04 MED ORDER — SUCCINYLCHOLINE CHLORIDE 200 MG/10ML IV SOSY
PREFILLED_SYRINGE | INTRAVENOUS | Status: DC | PRN
  Administered 2018-09-04: 80 mg via INTRAVENOUS

## 2018-09-04 MED ORDER — SODIUM CHLORIDE 0.9 % IV SOLN
500.0000 mL | Freq: Once | INTRAVENOUS | Status: AC
  Administered 2018-09-04: 09:00:00 via INTRAVENOUS

## 2018-09-04 MED ORDER — ONDANSETRON HCL 4 MG/2ML IJ SOLN: 4.0000 mg | Freq: Once | INTRAMUSCULAR | Status: DC | PRN

## 2018-09-04 MED ORDER — SUCCINYLCHOLINE CHLORIDE 20 MG/ML IJ SOLN
INTRAMUSCULAR | Status: AC
  Filled 2018-09-04: qty 1

## 2018-09-04 MED ORDER — METHOHEXITAL SODIUM 100 MG/10ML IV SOSY
PREFILLED_SYRINGE | INTRAVENOUS | Status: DC | PRN
  Administered 2018-09-04: 60 mg via INTRAVENOUS

## 2018-09-04 MED ORDER — LIDOCAINE HCL (PF) 2 % IJ SOLN
INTRAMUSCULAR | Status: AC
  Filled 2018-09-04: qty 10

## 2018-09-04 NOTE — Progress Notes (Signed)
Recreation Therapy Notes  Date: 09/04/2018  Time: 9:30 am   Location: Craft room   Behavioral response: N/A   Intervention Topic: Communication  Discussion/Intervention: Patient did not attend group.   Clinical Observations/Feedback:  Patient did not attend group.   Rikayla Demmon LRT/CTRS        Aron Needles 09/04/2018 12:07 PM
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
## 2018-09-04 NOTE — Plan of Care (Signed)
Pt. Verbally is able to contract for safety. Pt. Denies SI/HI. Pt. Complaint with medications this evening. Pt. Affect much brighter and cheerful this evening. Pt. Able to verbalize many positive feelings.    Problem: Education: Goal: Emotional status will improve Outcome: Progressing   Problem: Health Behavior/Discharge Planning: Goal: Compliance with therapeutic regimen will improve Outcome: Progressing   Problem: Self-Concept: Goal: Will verbalize positive feelings about self Outcome: Progressing   Problem: Safety: Goal: Ability to remain free from injury will improve Outcome: Progressing

## 2018-09-04 NOTE — Progress Notes (Signed)
Hypoglycemic Event  CBG: 68  Treatment: 15 GM carbohydrate snack  Symptoms: None  Follow-up CBG: Time: 07:06am CBG Result: 107  Possible Reasons for Event: Inadequate meal intake or possibly medication regimen.   Comments/MD notified: MD paged at 06:47am when notified by RN of patient low blood sugar. Hypoglycemia protocol initiated upon discovery for patient safety and security. Patient alert and orientated x4. Pt. Denies pain. Pt. Showing no signs or symptoms of hypoglycemia. Will continue to monitor for safety per routine. Willl continue to reach out to on call MD and attending.     Lenox Ponds

## 2018-09-04 NOTE — Plan of Care (Addendum)
Patient was educated on ECT and why she had to wait to go until the afternoon today (10/23) when her original appointment was  in the morning. Patient is compliant with medication and procedures on the unit, including going to ECT therapy. Patient stated she wanted to discharge "hopefully Saturday, but I want my mood to get better."   Problem: Education: Goal: Knowledge of [facility] Education information/materials will improve Outcome: Progressing   Problem: Health Behavior/Discharge Planning: Goal: Compliance with therapeutic regimen will improve Outcome: Progressing   Problem: Education: Goal: Emotional status will improve Outcome: Not Progressing

## 2018-09-04 NOTE — Anesthesia Postprocedure Evaluation (Signed)
Anesthesia Post Note  Patient: Julia Wheeler  Procedure(s) Performed: ECT TX  Patient location during evaluation: PACU Anesthesia Type: General Level of consciousness: awake and alert Pain management: pain level controlled Vital Signs Assessment: post-procedure vital signs reviewed and stable Respiratory status: spontaneous breathing and respiratory function stable Cardiovascular status: stable Anesthetic complications: no     Last Vitals:  Vitals:   09/04/18 1354 09/04/18 1355  BP:  134/90  Pulse: 96 95  Resp: (!) 23 19  Temp:  37.1 C  SpO2:  94%    Last Pain:  Vitals:   09/04/18 1355  TempSrc:   PainSc: 0-No pain                 [name]

## 2018-09-04 NOTE — [facility] Group Notes (Signed)
  Emotional Regulation October 23 1PM  Type of Therapy/Topic:  Group Therapy:  Emotion Regulation  Participation Level: Did not attend Description of Group:   The purpose of this group is to assist patients in learning to regulate negative emotions and experience positive emotions. Patients will be guided to discuss ways in which they have been vulnerable to their negative emotions. These vulnerabilities will be juxtaposed with experiences of positive emotions or situations, and patients will be challenged to use positive emotions to combat negative ones. Special emphasis will be placed on coping with negative emotions in conflict situations, and patients will process healthy conflict resolution skills.  Therapeutic Goals: 1. Patient will identify two positive emotions or experiences to reflect on in order to balance out negative emotions 2. Patient will label two or more emotions that they find the most difficult to experience 3. Patient will demonstrate positive conflict resolution skills through discussion and/or role plays  Summary of Patient Progress:       Therapeutic Modalities:   Cognitive Behavioral Therapy Feelings Identification Dialectical Behavioral Therapy   Shirl Weir LCSW 2:15 pm

## 2018-09-04 NOTE — Progress Notes (Signed)
Patient had an hypoglycemics episode, CBG 68, asymptomatic, denies any strange feeling non diaphoretic, thoughts are organized and coherent, patient is non diabetic patient, but doing ECT this am . Dr Antionette Poles paged x 3 and called repeatedly still waiting on response.

## 2018-09-04 NOTE — Progress Notes (Signed)
Received Julia Wheeler this AM before ECT, she was transport without incident. She returned from the PACU without incident and ate. She felt nausea afterwards and received Mylanta. She ate dinner and received her AM medications. Her VS were rechecked with an elevated B/P. She is resting in bed this PM without distress. She remains depressed with a flat affect.  She denied feeling suicidal.

## 2018-09-04 NOTE — Transfer of Care (Signed)
Immediate Anesthesia Transfer of Care Note  Patient: Julia Wheeler  Procedure(s) Performed: ECT TX  Patient Location: PACU  Anesthesia Type:General  Level of Consciousness: sedated  Airway & Oxygen Therapy: Patient Spontanous Breathing and Patient connected to face mask oxygen  Post-op Assessment: Report given to RN and Post -op Vital signs reviewed and stable  Post vital signs: Reviewed and stable  Last Vitals:  Vitals Value Taken Time  BP 137/95 09/04/2018  1:26 PM  Temp 37.4 C 09/04/2018  1:25 PM  Pulse 94 09/04/2018  1:25 PM  Resp 24 09/04/2018  1:28 PM  SpO2 92 % 09/04/2018  1:25 PM  Vitals shown include unvalidated device data.  Last Pain:  Vitals:   09/04/18 1325  TempSrc:   PainSc: 0-No pain      Patients Stated Pain Goal: 0 (09/03/18 1258)  Complications: No apparent anesthesia complications

## 2018-09-04 NOTE — B[facility] Group Notes (Signed)
[facility] Group Notes:  (Nursing/MHT/Case Management/Adjunct)  Date:  09/04/2018  Time:  10:26 PM  Type of Therapy:  Group Therapy  Participation Level:  Active  Participation Quality:  Appropriate  Affect:  Appropriate  Cognitive:  Oriented  Insight:  Appropriate  Engagement in Group:  Engaged  Modes of Intervention:  Discussion  Summary of Progress/Problems: Patient was quiet through out the entire period but was attentive  crack a smile every now  then during the discussion period. Group content was focused on communication with people that are different from you. Lelan Pons 09/04/2018, 10:26 PM

## 2018-09-04 NOTE — Procedures (Signed)
ECT SERVICES Physician's Interval Evaluation & Treatment Note  Patient Identification: Julia Wheeler MRN:  [id number] Date of Evaluation:  09/04/2018 TX #: 3  MADRS:   MMSE:   P.E. Findings:  No change to physical exam  Psychiatric Interval Note:  Mood a little bit better still blunted  Subjective:  Patient is a 52 y.o. female seen for evaluation for Electroconvulsive Therapy. No specific complaint  Treatment Summary:   []   Right Unilateral             [x]  Bilateral   % Energy : 1.0 ms 25%   Impedance: 1840 ohms  Seizure Energy Index: 9749 V squared  Postictal Suppression Index: 88%  Seizure Concordance Index: 96%  Medications  Pre Shock: Zofran 4 mg Brevital 60 mg succinylcholine 80 mg  Post Shock:    Seizure Duration: 49 seconds EMG 58 seconds EEG   Comments: Patient did well.  Anticipate another treatment on Friday.  At 0.2 mg Robinul.  Lungs:  [x]   Clear to auscultation               []  Other:   Heart:    [x]   Regular rhythm             []  irregular rhythm    [x]   Previous H&P reviewed, patient examined and there are NO CHANGES                 []   Previous H&P reviewed, patient examined and there are changes noted.   Mordecai Rasmussen, MD 10/23/20191:09 PM
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
## 2018-09-04 NOTE — Progress Notes (Signed)
D: Pt denies SI/HI/AVH, verbally is able to contract for safety. Pt is pleasant and cooperative. Pt. Affect much brighter this evening, smiling periodically during assessments. Pt. has no Complaints, denies need for pain medications this evening. Pt. Pain being addressed utilizing provider orders. Patient Interactions appropriate with staff and peers. Pt. Given extensive high falls risk education, but mobility has been steady and greatly improved.  A: Q x 15 minute observation checks were completed for safety. Patient was provided with education.  Patient was given/offered medications per orders. Patient  was encourage to attend groups, participate in unit activities and continue with plan of care. Pt. Chart and plans of care reviewed. Pt. Given support and encouragement.   R: Patient is complaint with medication and unit procedures with direction and encouragement. Pt. NPO maintained per orders. Pt. Blood sugars checked per pre-ect procedures.             Precautionary checks every 15 minutes for safety maintained, room free of safety hazards, patient sustains no injury or falls during this shift. Will endorse care to next shift.

## 2018-09-04 NOTE — Anesthesia Post-op Follow-up Note (Signed)
Anesthesia QCDR form completed.        

## 2018-09-04 NOTE — Tx Team (Signed)
Interdisciplinary Treatment and Diagnostic Plan Update  09/04/2018 Time of Session: 9:30am Julia Wheeler MRN: [id number]  Principal Diagnosis: Severe recurrent major depression without psychotic features [facility])  Secondary Diagnoses: Principal Problem:   Severe recurrent major depression without psychotic features (HCC) Active Problems:   Essential hypertension   Chronic left systolic heart failure (HCC)   Current Medications:  Current Facility-Administered Medications  Medication Dose Route Frequency Provider Last Rate Last Dose  . acetaminophen (TYLENOL) tablet 650 mg  650 mg Oral Q6H PRN Clapacs, Jackquline Denmark, MD   650 mg at 09/03/18 1258  . alum & mag hydroxide-simeth (MAALOX/MYLANTA) 200-200-20 MG/5ML suspension 30 mL  30 mL Oral Q4H PRN Clapacs, [name], MD   30 mL at 09/02/18 1554  . amitriptyline (ELAVIL) tablet 25 mg  25 mg Oral QHS Pucilowska, Jolanta B, MD   25 mg at 09/03/18 2158  . amLODipine (NORVASC) tablet 5 mg  5 mg Oral Daily Clapacs, Jackquline Denmark, MD   5 mg at 09/03/18 0904  . atorvastatin (LIPITOR) tablet 10 mg  10 mg Oral q1800 Clapacs, Jackquline Denmark, MD   10 mg at 09/03/18 1726  . ibuprofen (ADVIL,MOTRIN) tablet 600 mg  600 mg Oral Q6H PRN Clapacs, Jackquline Denmark, MD   600 mg at 09/02/18 2118  . magnesium hydroxide (MILK OF MAGNESIA) suspension 30 mL  30 mL Oral Daily PRN Clapacs, [name], MD      . metoprolol tartrate (LOPRESSOR) tablet 12.5 mg  12.5 mg Oral BID Clapacs, [name], MD   12.5 mg at 09/03/18 1726  . mirtazapine (REMERON) tablet 45 mg  45 mg Oral QHS Clapacs, Jackquline Denmark, MD   45 mg at 09/03/18 2158  . multivitamin with minerals tablet 1 tablet  1 tablet Oral Daily Clapacs, Jackquline Denmark, MD   1 tablet at 09/03/18 1258  . ondansetron (ZOFRAN) injection 4 mg  4 mg Intravenous Once PRN [name], MD      . ondansetron [facility]) tablet 4 mg  4 mg Oral Q8H PRN Clapacs, [name], MD      . protein supplement (PREMIER PROTEIN) liquid - approved for s/p bariatric surgery  11 oz Oral BID  BM Clapacs, Jackquline Denmark, MD   11 oz at 09/02/18 1500  . traZODone (DESYREL) tablet 100 mg  100 mg Oral QHS PRN Clapacs, Jackquline Denmark, MD   100 mg at 09/03/18 2159   PTA Medications: Medications Prior to Admission  Medication Sig Dispense Refill Last Dose  . acetaminophen (TYLENOL) 325 MG tablet Take 2 tablets (650 mg total) by mouth every 6 (six) hours as needed for mild pain or headache.   09/01/2018 at Unknown time  . amLODipine (NORVASC) 5 MG tablet Take 1 tablet (5 mg total) by mouth daily. For high blood pressure   09/02/2018 at Unknown time  . atorvastatin (LIPITOR) 10 MG tablet Take 1 tablet (10 mg total) by mouth at bedtime. For high cholesterol 30 tablet 1 09/01/2018 at Unknown time  . feeding supplement, ENSURE ENLIVE, (ENSURE ENLIVE) LIQD Take 237 mLs by mouth 2 (two) times daily between meals. (Nutritional supplement). 237 mL 12 09/01/2018 at Unknown time  . metoprolol tartrate (LOPRESSOR) 25 MG tablet Take 0.5 tablets (12.5 mg total) by mouth 2 (two) times daily. For high blood pressure   09/04/2018 at Unknown time  . Multiple Vitamin (MULTIVITAMIN WITH MINERALS) TABS tablet Take 1 tablet by mouth daily. (Vitamin supplement)   09/01/2018 at Unknown time    Patient  Stressors: Health problems  Patient Strengths: Ability for insight Average or above average intelligence Capable of independent living Licensed conveyancer Supportive family/friends Work skills  Treatment Modalities: Medication Management, Group therapy, Case management,  1 to 1 session with clinician, Psychoeducation, Recreational therapy.   Physician Treatment Plan for Primary Diagnosis: Severe recurrent major depression without psychotic features (HCC) Long Term Goal(s): Improvement in symptoms so as ready for discharge Improvement in symptoms so as ready for discharge   Short Term Goals: Ability to disclose and discuss suicidal ideas Ability to demonstrate self-control will improve Ability to maintain  clinical measurements within normal limits will improve  Medication Management: Evaluate patient's response, side effects, and tolerance of medication regimen.  Therapeutic Interventions: 1 to 1 sessions, Unit Group sessions and Medication administration.  Evaluation of Outcomes: Progressing  Physician Treatment Plan for Secondary Diagnosis: Principal Problem:   Severe recurrent major depression without psychotic features (HCC) Active Problems:   Essential hypertension   Chronic left systolic heart failure (HCC)  Long Term Goal(s): Improvement in symptoms so as ready for discharge Improvement in symptoms so as ready for discharge   Short Term Goals: Ability to disclose and discuss suicidal ideas Ability to demonstrate self-control will improve Ability to maintain clinical measurements within normal limits will improve     Medication Management: Evaluate patient's response, side effects, and tolerance of medication regimen.  Therapeutic Interventions: 1 to 1 sessions, Unit Group sessions and Medication administration.  Evaluation of Outcomes: Progressing    RN Treatment Plan for Primary Diagnosis: Severe recurrent major depression without psychotic features (HCC) Long Term Goal(s): Knowledge of disease and therapeutic regimen to maintain health will improve  Short Term Goals: Ability to disclose and discuss suicidal ideas, Ability to identify and develop effective coping behaviors will improve and Compliance with prescribed medications will improve  Medication Management: RN will administer medications as ordered by provider, will assess and evaluate patient's response and provide education to patient for prescribed medication. RN will report any adverse and/or side effects to prescribing provider.  Therapeutic Interventions: 1 on 1 counseling sessions, Psychoeducation, Medication administration, Evaluate responses to treatment, Monitor vital signs and CBGs as ordered,  Perform/monitor CIWA, COWS, AIMS and Fall Risk screenings as ordered, Perform wound care treatments as ordered.  Evaluation of Outcomes: Progressing   LCSW Treatment Plan for Primary Diagnosis: Severe recurrent major depression without psychotic features (HCC) Long Term Goal(s): Safe transition to appropriate next level of care at discharge, Engage patient in therapeutic group addressing interpersonal concerns.  Short Term Goals: Engage patient in aftercare planning with referrals and resources, Increase social support, Increase emotional regulation and Increase skills for wellness and recovery  Therapeutic Interventions: Assess for all discharge needs, 1 to 1 time with Social worker, Explore available resources and support systems, Assess for adequacy in community support network, Educate family and significant other(s) on suicide prevention, Complete Psychosocial Assessment, Interpersonal group therapy.  Evaluation of Outcomes: Progressing   Progress in Treatment: Attending groups: No. Participating in groups: No. Taking medication as prescribed: Yes. Toleration medication: Yes. Family/Significant other contact made: No, will contact:  Patient refused Patient understands diagnosis: Yes. Discussing patient identified problems/goals with staff: Yes. Medical problems stabilized or resolved: Yes. Denies suicidal/homicidal ideation: Yes. Issues/concerns per patient self-inventory: No. Other:   New problem(s) identified: No, Describe:  None  New Short Term/Long Term Goal(s): "To find a way to realize when I am getting depressed and find a way to not allow it to overtake me."  Patient  Goals:  "To find a way to realize when I am getting depressed and find a way to not allow it to overtake me."   Discharge Plan or Barriers: To discharge home and follow up with [facility].  Reason for Continuation of Hospitalization: Depression Medication stabilization  Estimated Length of  Stay:3 days  Attendees: Patient:  09/04/2018 10:16 AM  Physician: Mordecai Rasmussen, MD 09/04/2018 10:16 AM  Nursing:  09/04/2018 10:16 AM  RN Care Manager: 09/04/2018 10:16 AM  Social Worker: [name], LCSWA 09/04/2018 10:16 AM  Recreational Therapist:  09/04/2018 10:16 AM  Other: Lowella Dandy, LCSW 09/04/2018 10:16 AM  Other:  09/04/2018 10:16 AM  Other: 09/04/2018 10:16 AM    Scribe for Treatment Team: Suzan Slick, LCSW 09/04/2018 10:16 AM

## 2018-09-04 NOTE — Anesthesia Preprocedure Evaluation (Addendum)
Anesthesia Evaluation  Patient identified by MRN, date of birth, ID band Patient awake    Reviewed: Allergy & Precautions, NPO status , Patient's Chart, lab work & pertinent test results  History of Anesthesia Complications Negative for: history of anesthetic complications  Airway Mallampati: II  TM Distance: >3 FB Neck ROM: Full    Dental  (+) Poor Dentition   Pulmonary neg pulmonary ROS, neg sleep apnea, neg COPD,    breath sounds clear to auscultation- rhonchi (-) wheezing      Cardiovascular hypertension, Pt. on medications +CHF (NICM)  (-) CAD, (-) Past MI, (-) Cardiac Stents and (-) CABG  Rhythm:Regular Rate:Normal - Systolic murmurs and - Diastolic murmurs Loop recorder implanted since last ECT for syncopal episodes.   Neuro/Psych  Headaches, PSYCHIATRIC DISORDERS Anxiety Depression    GI/Hepatic Neg liver ROS, PUD, GERD  Medicated,  Endo/Other  negative endocrine ROSneg diabetes  Renal/GU negative Renal ROS     Musculoskeletal  (+) Arthritis ,   Abdominal (+) - obese,   Peds  Hematology  (+) anemia ,   Anesthesia Other Findings   Reproductive/Obstetrics                             Anesthesia Physical  Anesthesia Plan  ASA: III  Anesthesia Plan: General   Post-op Pain Management:    Induction: Intravenous  PONV Risk Score and Plan: 2 and Ondansetron  Airway Management Planned: Mask  Additional Equipment:   Intra-op Plan:   Post-operative Plan:   Informed Consent: I have reviewed the patients History and Physical, chart, labs and discussed the procedure including the risks, benefits and alternatives for the proposed anesthesia with the patient or authorized representative who has indicated his/her understanding and acceptance.     Plan Discussed with: CRNA and Anesthesiologist  Anesthesia Plan Comments:         Anesthesia Quick Evaluation  

## 2018-09-05 ENCOUNTER — Ambulatory Visit: Payer: Self-pay | Admitting: Cardiology

## 2018-09-05 NOTE — [facility] Group Notes (Signed)
[facility] Group Notes:  (Nursing/MHT/Case Management/Adjunct)  Date:  09/05/2018  Time:  10:28 PM  Type of Therapy:  Group Therapy  Participation Level:  Did Not Attend    Jinger Neighbors 09/05/2018, 10:28 PM

## 2018-09-05 NOTE — [facility] Group Notes (Signed)
LCSW Group Therapy Note  09/05/2018 1:00 pm  Type of Therapy/Topic:  Group Therapy:  Balance in Life  Participation Level:  Did Not Attend  Description of Group:    This group will address the concept of balance and how it feels and looks when one is unbalanced. Patients will be encouraged to process areas in their lives that are out of balance and identify reasons for remaining unbalanced. Facilitators will guide patients in utilizing problem-solving interventions to address and correct the stressor making their life unbalanced. Understanding and applying boundaries will be explored and addressed for obtaining and maintaining a balanced life. Patients will be encouraged to explore ways to assertively make their unbalanced needs known to significant others in their lives, using other group members and facilitator for support and feedback.  Therapeutic Goals: 1. Patient will identify two or more emotions or situations they have that consume much of in their lives. 2. Patient will identify signs/triggers that life has become out of balance:  3. Patient will identify two ways to set boundaries in order to achieve balance in their lives:  4. Patient will demonstrate ability to communicate their needs through discussion and/or role plays  Summary of Patient Progress:      Therapeutic Modalities:   Cognitive Behavioral Therapy Solution-Focused Therapy Assertiveness Training  Alease Frame, LCSW 09/05/2018 4:13 PM

## 2018-09-05 NOTE — Progress Notes (Signed)
Recreation Therapy Notes  Date: 09/05/2018  Time: 9:30 am   Location: Craft room   Behavioral response: N/A   Intervention Topic: Problem Solving  Discussion/Intervention: Patient did not attend group.   Clinical Observations/Feedback:  Patient did not attend group.   Kimiye Strathman LRT/CTRS        Burwell Bethel 09/05/2018 11:08 AM

## 2018-09-05 NOTE — Progress Notes (Signed)
D - Patient denies SI/HI/AVH. Patient states that her depression is a "7/10" and that her anxiety is a "7/10". Patient stated she had a bad day upon assessment. Patient told this writer that the day started off bad because they pushed her ECT therapy from the morning to the afternoon because she had some OJ to drink. Patient given education regarding the movement of her ECT therapy because of he low blood sugar. Patient denies pain. Patient was given extensive high falls risk education, but mobility has been steady. Patient given education regarding unit protocol on low blood sugar.   A: Q 15 minute observation checks were completed for safety. Patient was provided with education. Patient was given medications per MD orders. Patient attended wrapup group this evening. Patient given support and encouragement.   R - Patient is compliant with medication and procedures on the unit. Patient remains safe on the unit.

## 2018-09-05 NOTE — Plan of Care (Signed)
Patient is alert and oriented X 4, denies SI, HI and AVH. Patient complains of Depression 7/10 this morning and just sleepiness from last night.  Patient states." I don't know if I am just tired from the medicine or if I just didn't sleep well last night." Patient did get up to eat breakfast. Patient pleasant and appropriate with peers and staff on the unit. Patient goal is to attend group today. Patient rates pain 0/10. Patient showering and wearing regular clothing no scrubs. There is no self injurious behaviors noted. Problem: Education: Goal: Knowledge of [place] [facility] Education information/materials will improve Outcome: Progressing Goal: Emotional status will improve Outcome: Progressing   Problem: Health Behavior/Discharge Planning: Goal: Compliance with therapeutic regimen will improve Outcome: Progressing   Problem: Self-Concept: Goal: Will verbalize positive feelings about self Outcome: Progressing   Problem: Education: Goal: Emotional status will improve Outcome: Progressing

## 2018-09-05 NOTE — Progress Notes (Addendum)
Upon review of patient labs this RN discovered patient blood sugar low (65) from earlier in the day during ECT procedure and was treated per ECT staff following hypoglycemia protocol. This Clinical research associate for safety and security of the patient, blood sugar was rechecked to confirm WDL blood sugar levels this night shift. Will continue to monitor patient for safety.

## 2018-09-06 ENCOUNTER — Inpatient Hospital Stay: Payer: No Typology Code available for payment source | Admitting: Anesthesiology

## 2018-09-06 ENCOUNTER — Other Ambulatory Visit: Payer: Self-pay | Admitting: Psychiatry

## 2018-09-06 LAB — GLUCOSE, CAPILLARY: GLUCOSE-CAPILLARY: 85 mg/dL (ref 70–99)

## 2018-09-06 MED ORDER — ONDANSETRON HCL 4 MG/2ML IJ SOLN: 4.0000 mg | Freq: Once | INTRAMUSCULAR | Status: DC | PRN

## 2018-09-06 MED ORDER — SODIUM CHLORIDE 0.9 % IV SOLN
500.0000 mL | Freq: Once | INTRAVENOUS | Status: AC
  Administered 2018-09-06: 500 mL via INTRAVENOUS

## 2018-09-06 MED ORDER — GLYCOPYRROLATE 0.2 MG/ML IJ SOLN: 0.2000 mg | Freq: Once | INTRAMUSCULAR | Status: DC

## 2018-09-06 MED ORDER — GLYCOPYRROLATE 0.2 MG/ML IJ SOLN
INTRAMUSCULAR | Status: AC
  Filled 2018-09-06: qty 1

## 2018-09-06 MED ORDER — SODIUM CHLORIDE 0.9 % IV SOLN
INTRAVENOUS | Status: DC | PRN
  Administered 2018-09-06: 10:00:00 via INTRAVENOUS

## 2018-09-06 MED ORDER — SUCCINYLCHOLINE CHLORIDE 20 MG/ML IJ SOLN
INTRAMUSCULAR | Status: DC | PRN
  Administered 2018-09-06: 80 mg via INTRAVENOUS

## 2018-09-06 MED ORDER — FENTANYL CITRATE (PF) 100 MCG/2ML IJ SOLN: 25.0000 ug | INTRAMUSCULAR | Status: DC | PRN

## 2018-09-06 MED ORDER — METHOHEXITAL SODIUM 100 MG/10ML IV SOSY
PREFILLED_SYRINGE | INTRAVENOUS | Status: DC | PRN
  Administered 2018-09-06: 80 mg via INTRAVENOUS
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
## 2018-09-06 NOTE — Plan of Care (Addendum)
Patient found in day room upon my arrival. Patient is visible and somewhat social this evening. Patient mood and affect are improved. Appearance, speech, and motor activity are normal. Patient is polite and cooperative with assessment. Denies SI/HI/AVH. Denies pain. Compliant with HS medications and staff direction. Q 15 minute checks maintained. Education given RE:NPO after midnight status. Verbalized understanding. Will continue to monitor throughout the shift. Patient slept 6.5 hours. No apparent distress. Patient maintained NPO status. Will endorse care to oncoming shift.  Problem: Education: Goal: Knowledge of [facility] Education information/materials will improve Outcome: Progressing Goal: Emotional status will improve Outcome: Progressing   Problem: Health Behavior/Discharge Planning: Goal: Compliance with therapeutic regimen will improve Outcome: Progressing   Problem: Self-Concept: Goal: Will verbalize positive feelings about self Outcome: Progressing

## 2018-09-06 NOTE — Anesthesia Post-op Follow-up Note (Signed)
Anesthesia QCDR form completed.        

## 2018-09-06 NOTE — Anesthesia Procedure Notes (Signed)
Date/Time: 09/06/2018 10:14 AM Performed by: Junious Silk, CRNA Pre-anesthesia Checklist: Patient identified, Emergency Drugs available, Suction available, Patient being monitored and Timeout performed Oxygen Delivery Method: Ambu bag

## 2018-09-06 NOTE — [facility] Group Notes (Signed)
[facility] Group Notes:  (Nursing/MHT/Case Management/Adjunct)  Date:  09/06/2018  Time:  10:46 PM  Type of Therapy:  Group Therapy  Participation Level:  Active  Participation Quality:  Appropriate  Affect:  Appropriate  Cognitive:  Appropriate  Insight:  Appropriate  Engagement in Group:  Engaged  Modes of Intervention:  Discussion  Summary of Progress/Problems:  Burt Ek 09/06/2018, 10:46 PM
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
## 2018-09-06 NOTE — Transfer of Care (Signed)
Immediate Anesthesia Transfer of Care Note  Patient: Julia Wheeler  Procedure(s) Performed: ECT TX  Patient Location: PACU  Anesthesia Type:General  Level of Consciousness: awake, alert  and oriented  Airway & Oxygen Therapy: Patient Spontanous Breathing and Patient connected to face mask oxygen  Post-op Assessment: Report given to RN and Post -op Vital signs reviewed and stable  Post vital signs: Reviewed and stable  Last Vitals:  Vitals Value Taken Time  BP 118/93 09/06/2018 10:31 AM  Temp 37.1 C 09/06/2018 10:31 AM  Pulse 106 09/06/2018 10:31 AM  Resp 18 09/06/2018 10:31 AM  SpO2      Last Pain:  Vitals:   09/06/18 1031  TempSrc:   PainSc: Asleep      Patients Stated Pain Goal: 0 (09/03/18 1258)  Complications: No apparent anesthesia complications
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
## 2018-09-06 NOTE — Plan of Care (Signed)
problem: Education: Goal: Knowledge of [facility] Education information/materials will improve Outcome: Progressing   Problem: Health Behavior/Discharge Planning: Goal: Compliance with therapeutic regimen will improve Outcome: Progressing  DAR Note: Pt A & O X4. Denies SI, HI and AVH when assessed. Visible in milieu at intervals during shift. Presents with blunted affect, agitated and irritable about going to ECT late this morning "I don't know why, they are always here at 9:00 to pick me up, I really want to know why right now, why no one is here to pick me up yet for ECT". Reports headache 6/10 post ECT. Pt attended groups as scheduled, participated in activities on and off the unit.  Vitals done post ECT, Pt's BP was elevated. Scheduled and PRN medications (Tylenol--pain) given per order with verbal education and effects monitored. Encouraged pt to voice concerns, attend to ADLs and comply with current treatment regimen including groups. Safety checks maintained throughout this shift without verbal outburst or self harm gestures.  Pt receptive to care. Cooperative with unit routines. Tolerates all PO intake fairly when offered. Reports relief from her headache when reassessed 0/10. Safety maintained on and off unit.

## 2018-09-06 NOTE — Procedures (Signed)
ECT SERVICES Physician's Interval Evaluation & Treatment Note  Patient Identification: Julia Wheeler MRN:  [id number] Date of Evaluation:  09/06/2018 TX #: [id number]  MADRS:   MMSE:   P.E. Findings:  No change to physical exam  Psychiatric Interval Note:  Mood is stable a little bit better still some anxiety  Subjective:  Patient is a 52 y.o. female seen for evaluation for Electroconvulsive Therapy. Memory complaints and minor depression  Treatment Summary:   []   Right Unilateral             [x]  Bilateral   % Energy : 1.0 ms 25%   Impedance: 1130 ohms  Seizure Energy Index: 8137 V squared  Postictal Suppression Index: 93%  Seizure Concordance Index: 98%  Medications  Pre Shock: Zofran 4 mg Brevital 80 mg succinylcholine 80 mg  Post Shock:    Seizure Duration: 44 seconds EMG 56 seconds EEG   Comments: It is uncertain whether we are going to do treatment on Monday or not she and I are going to have to discuss it today and tomorrow  Lungs:  [x]   Clear to auscultation               []  Other:   Heart:    [x]   Regular rhythm             []  irregular rhythm    [x]   Previous H&P reviewed, patient examined and there are NO CHANGES                 []   Previous H&P reviewed, patient examined and there are changes noted.   Mordecai Rasmussen, MD 10/25/201910:14 AM

## 2018-09-06 NOTE — [facility] Group Notes (Signed)
Feelings Around Relapse 09/06/2018 1PM  Type of Therapy and Topic:  Group Therapy:  Feelings around Relapse and Recovery  Participation Level:  Active   Description of Group:    Patients in this group will discuss emotions they experience before and after a relapse. They will process how experiencing these feelings, or avoidance of experiencing them, relates to having a relapse. Facilitator will guide patients to explore emotions they have related to recovery. Patients will be encouraged to process which emotions are more powerful. They will be guided to discuss the emotional reaction significant others in their lives may have to patients' relapse or recovery. Patients will be assisted in exploring ways to respond to the emotions of others without this contributing to a relapse.  Therapeutic Goals: 1. Patient will identify two or more emotions that lead to a relapse for them 2. Patient will identify two emotions that result when they relapse 3. Patient will identify two emotions related to recovery 4. Patient will demonstrate ability to communicate their needs through discussion and/or role plays   Summary of Patient Progress: Actively and appropriately engaged in the group. Patient was able to provide support and validation to other group members.Patient practiced active listening when interacting with the facilitator and other group members. Pt states when she finds herself relapsing "I go to the [employer] store, stay in  my room to avoid facing reality."   Patient is still in the process of obtaining treatment goals.      Therapeutic Modalities:   Cognitive Behavioral Therapy Solution-Focused Therapy Assertiveness Training Relapse Prevention Therapy   Suzan Slick, LCSW 09/06/2018 2:19 PM

## 2018-09-06 NOTE — Progress Notes (Signed)
Recreation Therapy Notes  Date: 09/06/2018  Time: 9:30 am   Location: Craft room   Behavioral response: N/A   Intervention Topic: Leisure  Discussion/Intervention: Patient did not attend group.   Clinical Observations/Feedback:  Patient did not attend group.   Carley Strickling LRT/CTRS         Kaidin Boehle 09/06/2018 11:30 AM

## 2018-09-07 MED ORDER — TRAZODONE HCL 100 MG PO TABS: 100.0000 mg | ORAL_TABLET | Freq: Every evening | ORAL | 0 refills | Status: DC | PRN

## 2018-09-07 MED ORDER — AMLODIPINE BESYLATE 5 MG PO TABS: 5.0000 mg | ORAL_TABLET | Freq: Every day | ORAL | 1 refills | Status: DC

## 2018-09-07 MED ORDER — AMITRIPTYLINE HCL 25 MG PO TABS: 25.0000 mg | ORAL_TABLET | Freq: Every day | ORAL | 0 refills | Status: DC

## 2018-09-07 MED ORDER — AMITRIPTYLINE HCL 25 MG PO TABS: 25.0000 mg | ORAL_TABLET | Freq: Every day | ORAL | 1 refills | Status: DC

## 2018-09-07 MED ORDER — AMLODIPINE BESYLATE 5 MG PO TABS: 5.0000 mg | ORAL_TABLET | Freq: Every day | ORAL | 0 refills | Status: DC

## 2018-09-07 MED ORDER — TRAZODONE HCL 100 MG PO TABS: 100.0000 mg | ORAL_TABLET | Freq: Every evening | ORAL | 1 refills | Status: DC | PRN

## 2018-09-07 MED ORDER — ATORVASTATIN CALCIUM 10 MG PO TABS: 10.0000 mg | ORAL_TABLET | Freq: Every day | ORAL | 1 refills | Status: DC

## 2018-09-07 MED ORDER — MIRTAZAPINE 45 MG PO TABS: 45.0000 mg | ORAL_TABLET | Freq: Every day | ORAL | 1 refills | Status: DC

## 2018-09-07 MED ORDER — METOPROLOL TARTRATE 25 MG PO TABS: 12.5000 mg | ORAL_TABLET | Freq: Two times a day (BID) | ORAL | 0 refills | Status: DC

## 2018-09-07 MED ORDER — MIRTAZAPINE 45 MG PO TABS: 45.0000 mg | ORAL_TABLET | Freq: Every day | ORAL | 0 refills | Status: DC

## 2018-09-07 MED ORDER — METOPROLOL TARTRATE 25 MG PO TABS: 12.5000 mg | ORAL_TABLET | Freq: Two times a day (BID) | ORAL | 1 refills | Status: DC

## 2018-09-07 MED ORDER — ATORVASTATIN CALCIUM 10 MG PO TABS: 10.0000 mg | ORAL_TABLET | Freq: Every day | ORAL | 0 refills | Status: DC
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
## 2018-09-07 NOTE — [facility] Suicide Risk Assessment (Signed)
[facility] Discharge Suicide Risk Assessment   Principal Problem: Severe recurrent major depression without psychotic features [facility]) Discharge Diagnoses:  Patient Active Problem List   Diagnosis Date Noted  . Severe recurrent major depression with psychotic features (HCC) [F33.3] 11/28/2017    Priority: High  . PTSD (post-traumatic stress disorder) [F43.10] 08/26/2015    Priority: Medium  . Alcohol use disorder, moderate, dependence (HCC) [F10.20] 08/26/2015    Priority: Medium  . Nausea with vomiting [R11.2] 12/27/2014    Priority: Medium  . Essential hypertension [I10] 12/27/2014    Priority: Medium  . Severe recurrent major depression without psychotic features (HCC) [F33.2] 08/29/2018  . MDD (major depressive disorder), recurrent episode, moderate (HCC) [F33.1]   . Orthostatic hypotension [I95.1] 07/23/2018  . Syncope [R55] 07/22/2018  . MDD (major depressive disorder), single episode, severe with psychosis (HCC) [F32.3] 11/22/2017  . Hyperactive small intestine s/p Dx laparoscopy 11/11/2017 [K59.9] 11/12/2017  . MDD (major depressive disorder), recurrent severe, without psychosis (HCC) [F33.2] 11/12/2017  . Depression with anxiety [F41.8] 11/11/2017  . GERD (gastroesophageal reflux disease) [K21.9] 11/11/2017  . Alcohol abuse [F10.10] 11/11/2017  . Diverticulosis [K57.90] 05/24/2017  . Internal hemorrhoids [K64.8] 05/24/2017  . Rectal bleeding [K62.5]   . Abnormal abdominal CT scan [R93.5]   . Gastric bypass status for obesity [Z98.84]   . Esophageal dysphagia [R13.10]   . Symptomatic anemia [D64.9] 05/22/2017  . Chronic left systolic heart failure (HCC) [I50.22] 06/26/2016  . Weight loss [R63.4] 05/04/2016  . B12 deficiency [E53.8] 05/04/2016  . MDD (major depressive disorder), recurrent, severe, with psychosis (HCC) [F33.3] 08/26/2015  . Head trauma [S09.90XA] 08/26/2015  . AP (abdominal pain) [R10.9]   . Abdominal pain [R10.9] 12/27/2014  . Enteritis [K52.9] 12/27/2014  .  Hyponatremia [E87.1] 12/27/2014  . Depression [F32.9] 12/27/2014  . Anemia, iron deficiency [D50.9] 12/27/2014  . Sickle cell trait (HCC) [D57.3]   . Iron deficiency anemia [D50.9]   . UGI bleed [K92.2] 05/18/2014  . Abdominal pain, left upper quadrant [R10.12] 05/18/2014  . Intussusception of intestine - physiologic & self-resolved [K56.1] 05/17/2014    Total Time spent with patient: 45 minutes  Musculoskeletal: Strength & Muscle Tone: within normal limits Gait & Station: normal Patient leans: N/A  Psychiatric Specialty Exam: Review of Systems  Constitutional: Negative.   HENT: Negative.   Eyes: Negative.   Respiratory: Negative.   Cardiovascular: Negative.   Gastrointestinal: Negative.   Musculoskeletal: Negative.   Skin: Negative.   Neurological: Negative.   Psychiatric/Behavioral: Negative for depression, hallucinations, memory loss, substance abuse and suicidal ideas. The patient is not nervous/anxious and does not have insomnia.     Blood pressure 102/77, pulse (!) 108, temperature 98.6 F (37 C), temperature source Oral, resp. rate 16, height 5\' 7"  (1.702 m), weight 73.9 kg, last menstrual period 08/28/2015, SpO2 100 %.Body mass index is 25.53 kg/m.  General Appearance: Fairly Groomed  Patent attorney::  Good  Speech:  Clear and Coherent409  Volume:  Normal  Mood:  Euthymic  Affect:  Constricted  Thought Process:  Coherent  Orientation:  Full (Time, Place, and Person)  Thought Content:  Logical  Suicidal Thoughts:  No  Homicidal Thoughts:  No  Memory:  Immediate;   Fair Recent;   Fair Remote;   Fair  Judgement:  Good  Insight:  Good  Psychomotor Activity:  Normal  Concentration:  Good  Recall:  Good  Fund of Knowledge:Good  Language: Good  Akathisia:  Negative  Handed:  Right  AIMS (if indicated):  Assets:  Communication Skills Desire for Improvement Housing Physical Health Resilience Social Support  Sleep:  Number of Hours: 7  Cognition: WNL   ADL's:  Intact   Mental Status Per Nursing Assessment::   On Admission:  Suicidal ideation indicated by patient, Self-harm thoughts  Demographic Factors:  NA  Loss Factors: Financial problems/change in socioeconomic status  Historical Factors: NA  Risk Reduction Factors:   Sense of responsibility to family, Religious beliefs about death, Living with another person, especially a relative, Positive social support and Positive therapeutic relationship  Continued Clinical Symptoms:  Depression:   Anhedonia  Cognitive Features That Contribute To Risk:  Loss of executive function    Suicide Risk:  Minimal: No identifiable suicidal ideation.  Patients presenting with no risk factors but with morbid ruminations; may be classified as minimal risk based on the severity of the depressive symptoms  Follow-up Information    [facility]. Go on 09/10/2018.   Why:  Please follow up with Vesta Mixer on Tuesday, September 10, 2018 at 8am. Please bring your social security card, ID, proof of income and insurance card. Thank you. Contact information: [address] [place] [address] [phone]           Plan Of Care/Follow-up recommendations:  Activity:  Activity as tolerated all normal Diet:  Regular diet Other:  Follow-up with local mental health providers in the community.  Prescriptions written.  14-day supply will be provided as soon as it can be obtained.  Mordecai Rasmussen, MD 09/07/2018, 11:27 AM

## 2018-09-07 NOTE — Plan of Care (Signed)
Patient is appropriate and cooperative with assessment. Patient denies SI/HI/AVH. Patient denies any depression or anxiety at this time. Patient reports ECT treatments are "going well." Patient is seen in the milieu. Patient has no complaints. Patient's safety is maintained on the unit.    Problem: Education: Goal: Knowledge of [place] [facility] Education information/materials will improve Outcome: Progressing   Problem: Education: Goal: Knowledge of [place] [facility] Education information/materials will improve Outcome: Progressing Goal: Emotional status will improve Outcome: Progressing   Problem: Self-Concept: Goal: Will verbalize positive feelings about self Outcome: Progressing

## 2018-09-07 NOTE — Plan of Care (Signed)
Patient is interacting  and watching television with peers with out any issues, no noticeable side effects from ECT treatment done earlier, patient compliant with her medication regimen, denies any SI/HI and no signs of AVH, sleep long hours with out any interruptions contract for safety of self and others, appetite is good , 15 minute safety rounding is maintained no distress.    Problem: Education: Goal: Knowledge of [place] [facility] Education information/materials will improve Outcome: Progressing Goal: Emotional status will improve Outcome: Progressing   Problem: Health Behavior/Discharge Planning: Goal: Compliance with therapeutic regimen will improve Outcome: Progressing   Problem: Self-Concept: Goal: Will verbalize positive feelings about self Outcome: Progressing

## 2018-09-07 NOTE — Progress Notes (Signed)
Patient was provided with discharge summary, Transition packet, and Suicide Risk Assessment. Verbalized discharge summary, transition packet and suicide risk assessment, patient made aware of any change in medications, and all upcoming appointments.   Patient belongings/money returned. Rx provided to patient.  Patient denied any SI, denies plans for self harm or the harm of others. Patient is calm, with appropriate affect and eye contact. Patient reports no complaints at this time.   Patient will be discharged to self.

## 2018-09-07 NOTE — Progress Notes (Signed)
  [facility] Adult Case Management Discharge Plan :  Will you be returning to the same living situation after discharge:  Yes,  will go back home At discharge, do you have transportation home?: Yes,  pt will call her ride Do you have the ability to pay for your medications: Yes,  pt has insurance  Release of information consent forms completed and in the chart;  Patient's signature needed at discharge.  Patient to Follow up at: Follow-up Information    [facility]. Go on 09/10/2018.   Why:  Please follow up with Vesta Mixer on Tuesday, September 10, 2018 at 8am. Please bring your social security card, ID, proof of income and insurance card. Thank you. Contact information: [address] [place] [address] [phone]           Next level of care provider has access to [facility] Link:no  Safety Planning and Suicide Prevention discussed: Yes,  CSW discussed with pt  Have you used any form of tobacco in the last 30 days? (Cigarettes, Smokeless Tobacco, Cigars, and/or Pipes): No  Has patient been referred to the Quitline?: N/A patient is not a smoker  Patient has been referred for addiction treatment: N/A  Lexi Conaty  [name], LCSW 09/07/2018, 10:46 AM

## 2018-09-09 ENCOUNTER — Ambulatory Visit (INDEPENDENT_AMBULATORY_CARE_PROVIDER_SITE_OTHER): Payer: Self-pay | Admitting: *Deleted

## 2018-09-09 DIAGNOSIS — R55 Syncope and collapse: Secondary | ICD-10-CM

## 2018-09-09 NOTE — Progress Notes (Signed)
[facility]link Summary Report / Loop Recorder 

## 2018-09-11 ENCOUNTER — Other Ambulatory Visit: Payer: Self-pay | Admitting: Psychiatry

## 2018-09-12 DIAGNOSIS — R0600 Dyspnea, unspecified: Secondary | ICD-10-CM

## 2018-09-16 NOTE — Anesthesia Postprocedure Evaluation (Signed)
Anesthesia Post Note  Patient: Julia Wheeler  Procedure(s) Performed: ECT TX  Patient location during evaluation: PACU Anesthesia Type: General Level of consciousness: awake and alert Pain management: pain level controlled Vital Signs Assessment: post-procedure vital signs reviewed and stable Respiratory status: spontaneous breathing, nonlabored ventilation, respiratory function stable and patient connected to nasal cannula oxygen Cardiovascular status: blood pressure returned to baseline and stable Postop Assessment: no apparent nausea or vomiting Anesthetic complications: no     Last Vitals:  Vitals:   09/07/18 0620 09/07/18 0621  BP: 105/80 102/77  Pulse: 84 (!) 108  Resp: 16   Temp: 37 C   SpO2: 100% 100%    Last Pain:  Vitals:   09/07/18 0755  TempSrc:   PainSc: 0-No pain                 Yevette Edwards

## 2018-10-02 LAB — CUP PACEART REMOTE DEVICE CHECK
MDC IDC PG IMPLANT DT: 20190925
MDC IDC SESS DTM: 20191028120726

## 2018-10-14 ENCOUNTER — Ambulatory Visit (INDEPENDENT_AMBULATORY_CARE_PROVIDER_SITE_OTHER): Payer: Self-pay

## 2018-10-14 DIAGNOSIS — R55 Syncope and collapse: Secondary | ICD-10-CM

## 2018-10-14 NOTE — Progress Notes (Signed)
[facility]link Summary Report / Loop Recorder 

## 2018-11-11 LAB — CUP PACEART REMOTE DEVICE CHECK
Date Time Interrogation Session: 20191202091026
MDC IDC PG IMPLANT DT: 20190925

## 2018-11-18 ENCOUNTER — Ambulatory Visit (INDEPENDENT_AMBULATORY_CARE_PROVIDER_SITE_OTHER): Payer: Self-pay

## 2018-11-18 DIAGNOSIS — R55 Syncope and collapse: Secondary | ICD-10-CM

## 2018-11-19 LAB — CUP PACEART REMOTE DEVICE CHECK
Implantable Pulse Generator Implant Date: 20190925
MDC IDC SESS DTM: 20200104092051

## 2018-11-19 NOTE — Progress Notes (Signed)
[facility]link Summary Report / Loop Recorder 

## 2018-11-22 ENCOUNTER — Encounter: Payer: Self-pay | Admitting: Internal Medicine

## 2018-11-22 ENCOUNTER — Ambulatory Visit (INDEPENDENT_AMBULATORY_CARE_PROVIDER_SITE_OTHER): Payer: Self-pay | Admitting: Internal Medicine

## 2018-11-22 VITALS — BP 126/84 | HR 94 | Ht 67.0 in | Wt 163.2 lb

## 2018-11-22 DIAGNOSIS — I428 Other cardiomyopathies: Secondary | ICD-10-CM

## 2018-11-22 DIAGNOSIS — R55 Syncope and collapse: Secondary | ICD-10-CM

## 2018-11-22 LAB — CUP PACEART INCLINIC DEVICE CHECK
Date Time Interrogation Session: 20200110162258
Implantable Pulse Generator Implant Date: 20190925

## 2018-11-22 MED ORDER — FUROSEMIDE 40 MG PO TABS: ORAL_TABLET | ORAL | 0 refills | Status: DC

## 2018-11-22 MED ORDER — HYDRALAZINE HCL 25 MG PO TABS: 25.0000 mg | ORAL_TABLET | Freq: Two times a day (BID) | ORAL | 3 refills | Status: DC

## 2018-11-22 MED ORDER — METOPROLOL SUCCINATE ER 25 MG PO TB24: 25.0000 mg | ORAL_TABLET | Freq: Every day | ORAL | 3 refills | Status: DC

## 2018-11-22 NOTE — Patient Instructions (Addendum)
  Medication Instructions:  Your physician has recommended you make the following change in your medication:   1. Stop Norvasc 2. Stop Metoprolol Tartrate 3. Begin Hydralazine, 25mg  tablet, twice daily 4. Begin Metoprolol Succinate, 25mg , once daily 5. Take Lasix. 20mg  (1/2 tablet) for one day; then take 40mg  (1 tablet) for 2 days  Labwork: None ordered.  Testing/Procedures: None ordered.  Follow-Up: Your physician recommends that you schedule a follow-up appointment in:   [place], [place] in 4-5 weeks.  Any Other Special Instructions Will Be Listed Below (If Applicable).  Please begin to wear Spanx (compression stockings).    If you need a refill on your cardiac medications before your next appointment, please call your pharmacy.
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
## 2018-12-19 ENCOUNTER — Ambulatory Visit (INDEPENDENT_AMBULATORY_CARE_PROVIDER_SITE_OTHER): Payer: Self-pay

## 2018-12-19 DIAGNOSIS — R55 Syncope and collapse: Secondary | ICD-10-CM

## 2018-12-20 LAB — CUP PACEART REMOTE DEVICE CHECK
Implantable Pulse Generator Implant Date: 20190925
MDC IDC SESS DTM: 20200206124118
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
## 2018-12-31 ENCOUNTER — Ambulatory Visit (INDEPENDENT_AMBULATORY_CARE_PROVIDER_SITE_OTHER): Payer: Self-pay | Admitting: Nurse Practitioner

## 2018-12-31 VITALS — BP 154/102 | HR 98 | Ht 67.0 in | Wt 161.0 lb

## 2018-12-31 DIAGNOSIS — I428 Other cardiomyopathies: Secondary | ICD-10-CM

## 2018-12-31 DIAGNOSIS — I10 Essential (primary) hypertension: Secondary | ICD-10-CM

## 2018-12-31 DIAGNOSIS — I951 Orthostatic hypotension: Secondary | ICD-10-CM

## 2018-12-31 DIAGNOSIS — R55 Syncope and collapse: Secondary | ICD-10-CM

## 2018-12-31 NOTE — Patient Instructions (Signed)
Medication Instructions:   Your physician recommends that you continue on your current medications as directed. Please refer to the Current Medication list given to you today.  If you need a refill on your cardiac medications before your next appointment, please call your pharmacy.   Lab work: NONE ORDERED  TODAY   If you have labs (blood work) drawn today and your tests are completely normal, you will receive your results only by: [name] MyChart Message (if you have MyChart) OR . A paper copy in the mail If you have any lab test that is abnormal or we need to change your treatment, we will call you to review the results.  Testing/Procedures: NONE ORDERED  TODAY   Follow-Up: IN 4 TO 6 WEEKS WITH KLEIN   Any Other Special Instructions Will Be Listed Below (If Applicable).   HERE IS A WEBSITE FOR INFORMATION ON POTS AND LIFESTYLE TOOLS :   [email or web address]  ALONG WITH PRINT OUT PAPER HANDOUT

## 2018-12-31 NOTE — Progress Notes (Signed)
[facility]link Summary Report / Loop Recorder 

## 2019-01-14 ENCOUNTER — Telehealth: Payer: Self-pay | Admitting: Internal Medicine

## 2019-01-14 NOTE — Telephone Encounter (Signed)
New Message   Pt is calling and needs her records or notes to send to disability.  She needs them faxed too  Fax number [phone] [employer]    Please call Pt back

## 2019-01-14 NOTE — Telephone Encounter (Signed)
Will forward to Dr. Odessa Fleming nurse and medical records.

## 2019-01-14 NOTE — Telephone Encounter (Signed)
Patient needs to fill out an authorization form to have medical records sent to law firm. Will mail form to patient.

## 2019-01-21 ENCOUNTER — Ambulatory Visit (INDEPENDENT_AMBULATORY_CARE_PROVIDER_SITE_OTHER): Payer: Self-pay | Admitting: *Deleted

## 2019-01-21 DIAGNOSIS — R55 Syncope and collapse: Secondary | ICD-10-CM

## 2019-01-22 LAB — CUP PACEART REMOTE DEVICE CHECK
Date Time Interrogation Session: 20200310124252
Implantable Pulse Generator Implant Date: 20190925

## 2019-01-23 ENCOUNTER — Encounter: Payer: Self-pay | Admitting: Internal Medicine

## 2019-01-28 ENCOUNTER — Telehealth: Payer: Self-pay

## 2019-01-28 NOTE — Telephone Encounter (Signed)
LVM regarding schedule changes on 3/23.

## 2019-01-29 NOTE — Progress Notes (Signed)
[facility]link Summary Report / Loop Recorder 

## 2019-01-30 NOTE — Telephone Encounter (Signed)
Spoke with pt regarding upcoming appt. She states she would like to speak with Dr Graciela Husbands before postponing her appointment. She is feeling sluggish and does not know what is going on. I advised her I would have him call to assess her needs. She agrees and will await his call.

## 2019-01-30 NOTE — Telephone Encounter (Signed)
Called and spoke to pt regarding appointment  for syncope   Currently symptoms are limited to tired and presyncope  Discussed rescheduling of the appt 8 weeks from now  Pt is agreeable and advised to call if interval problems

## 2019-02-03 ENCOUNTER — Ambulatory Visit: Payer: Self-pay | Admitting: Internal Medicine

## 2019-02-03 ENCOUNTER — Encounter: Payer: Self-pay | Admitting: Internal Medicine

## 2019-02-03 NOTE — Telephone Encounter (Signed)
Pt has already been rescheduled to see Dr. Graciela Husbands on 03/31/2019. Will remove from the covid 19 cancel pool.

## 2019-02-07 IMAGING — CT CT ABD-PELV W/O CM
2 of 4 series · 15 of 46 positions shown, 17 images · non-contrast
Comparison: 05/22/2017

CLINICAL DATA: Left upper quadrant pain for several days

EXAM:
CT ABDOMEN AND PELVIS WITHOUT CONTRAST
TECHNIQUE: Multidetector CT imaging of the abdomen and pelvis was performed
following the standard protocol without IV contrast.

[Series 3: ap without · axial · non-contrast · 0.71mm/px · z∈[+425,+845]mm · 12 of 94 slices shown, 14 images]
[im 5/94  soft-tissue]
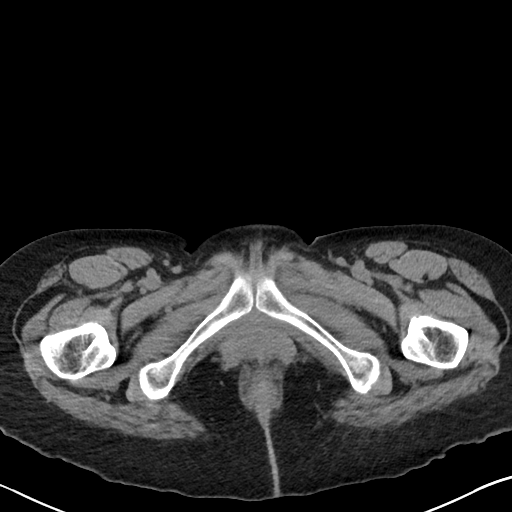
[im 5/94  bone]
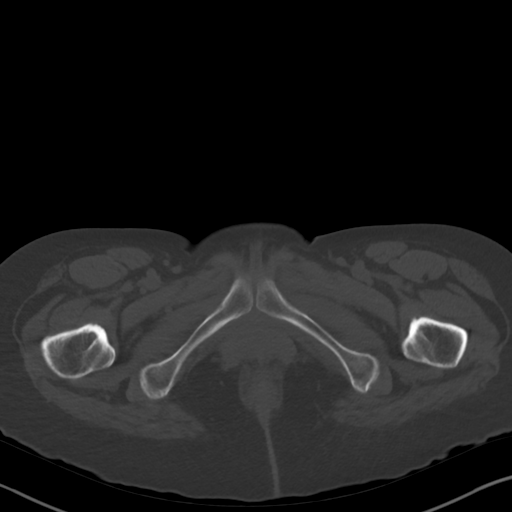
[im 15/94  soft-tissue]
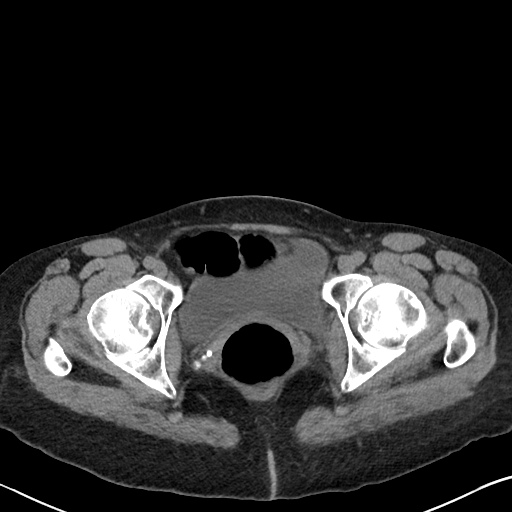
[im 20/94  soft-tissue]
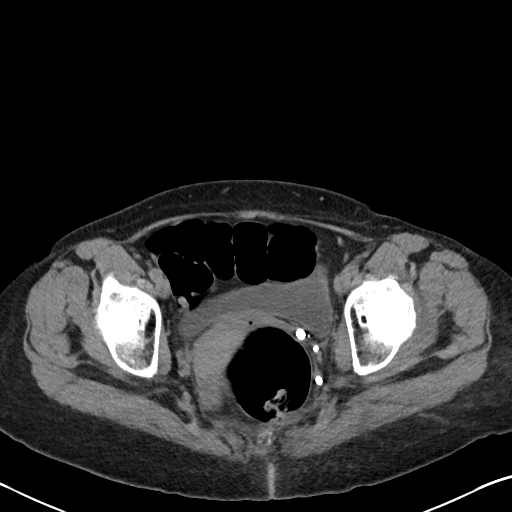
[im 30/94  soft-tissue]
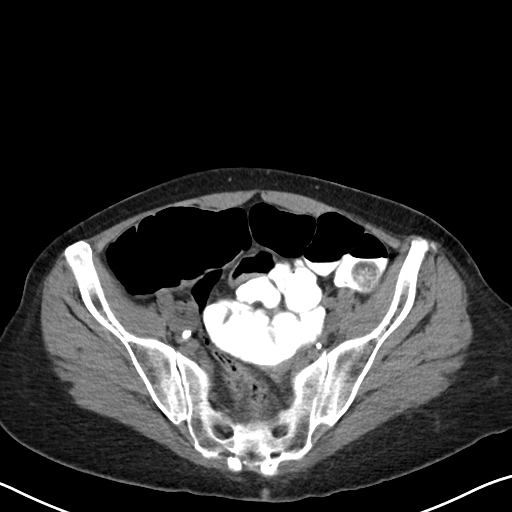
[im 35/94  soft-tissue]
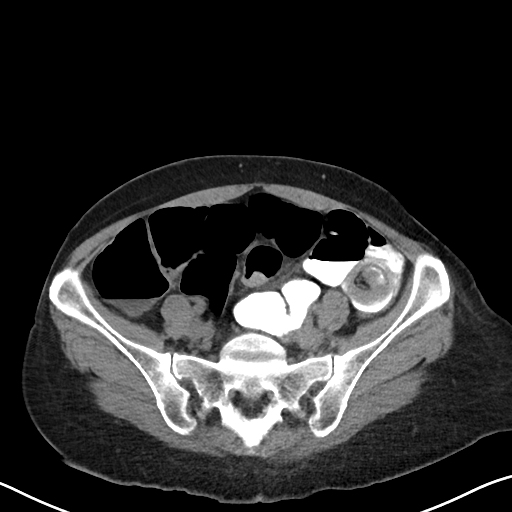
[im 45/94  soft-tissue]
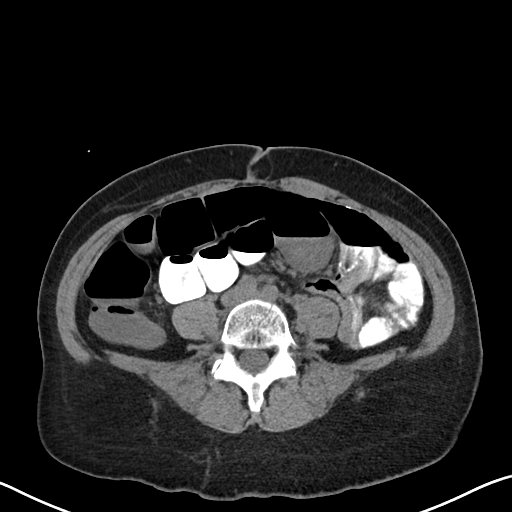
[im 49/94  soft-tissue]
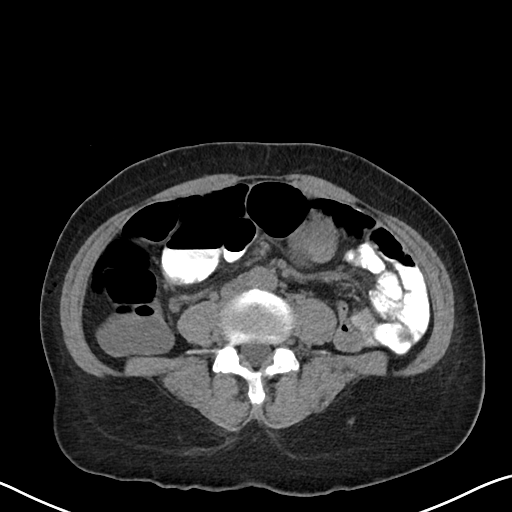
[im 59/94  soft-tissue]
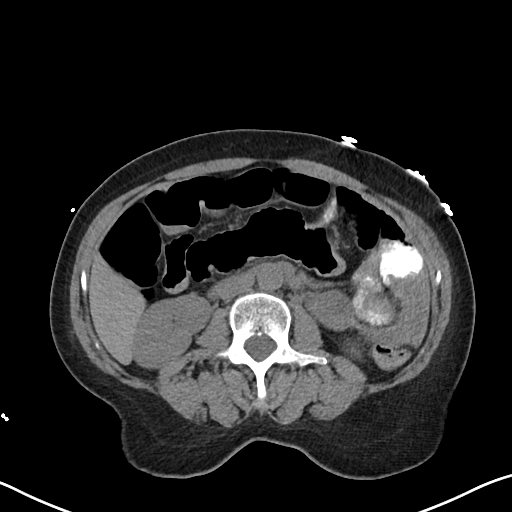
[im 64/94  soft-tissue]
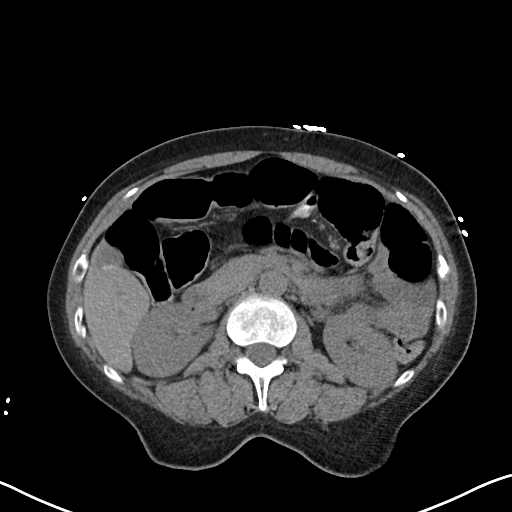
[im 64/94  bone]
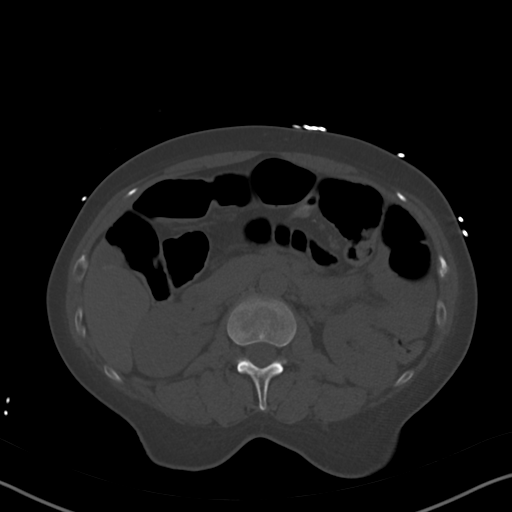
[im 74/94  soft-tissue]
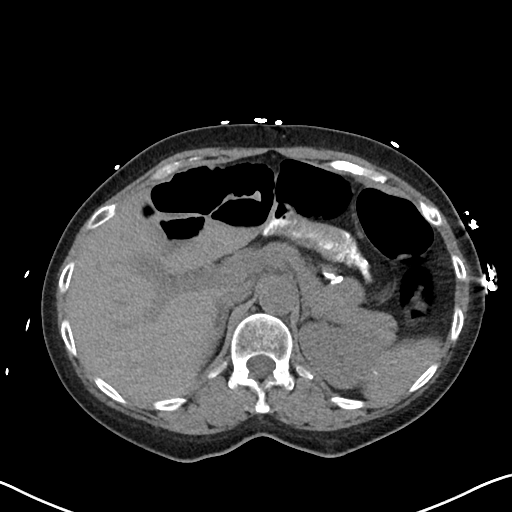
[im 79/94  soft-tissue]
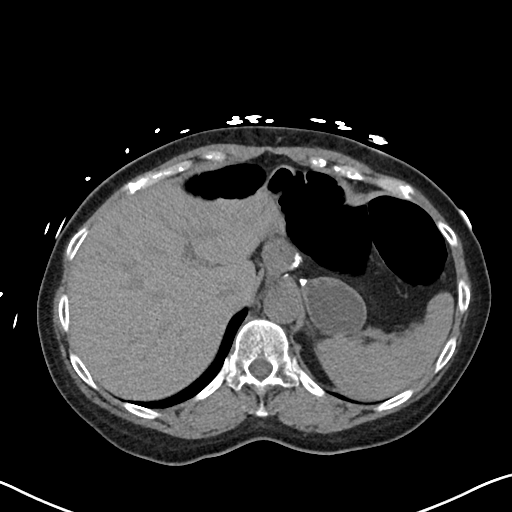
[im 89/94  soft-tissue]
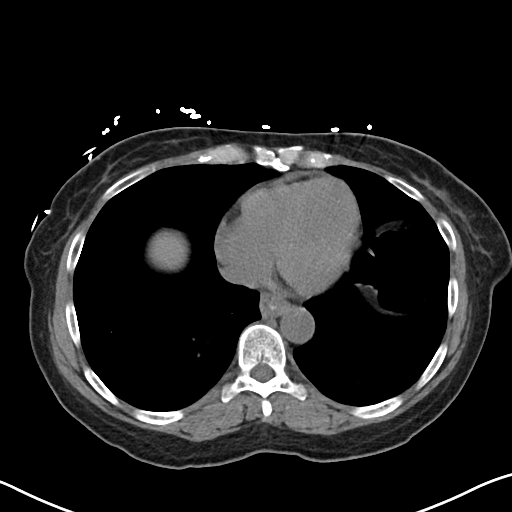

[Series 6: cor · coronal · 0.69mm/px · 3 of 78 slices shown]
[im 26/78  soft-tissue]
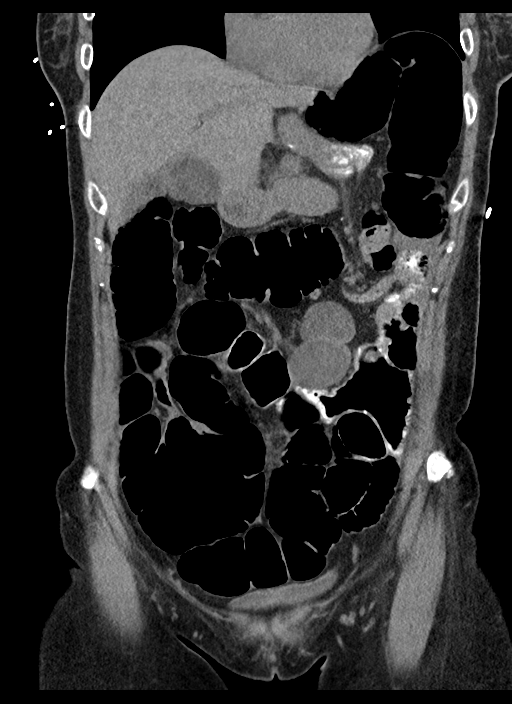
[im 35/78  soft-tissue]
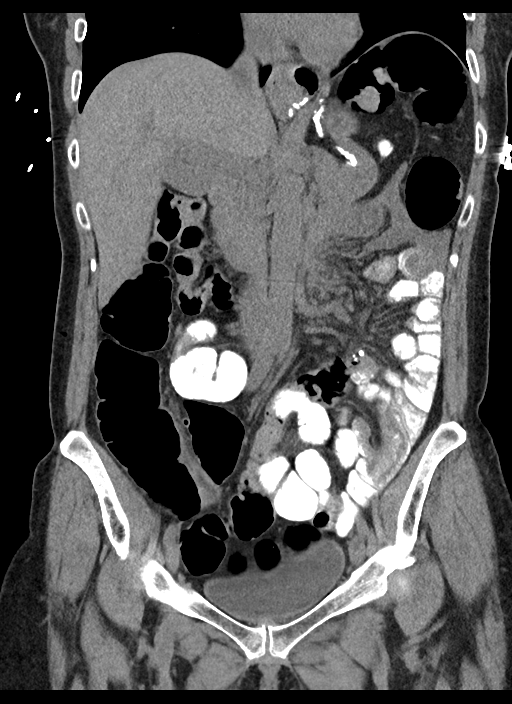
[im 43/78  soft-tissue]
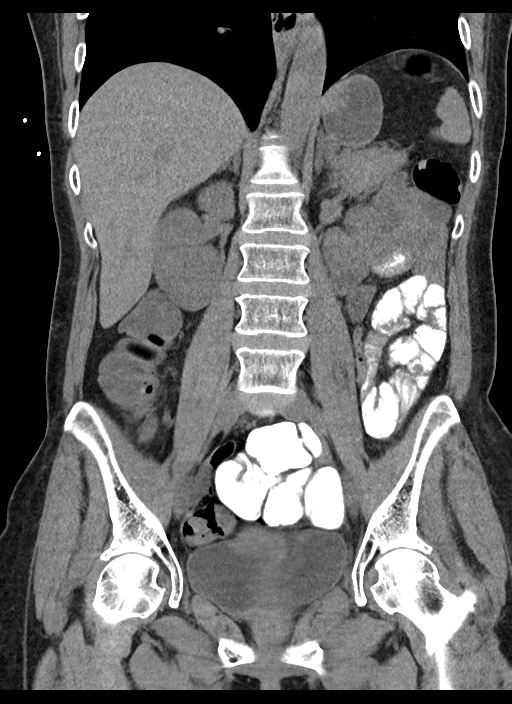

[15 of 46 positions shown; findings below may reference images not displayed]

FINDINGS: Lower chest: No acute abnormality.

Hepatobiliary: The liver is within normal limits. The gallbladder is
partially distended with multiple dependent gallstones.

Pancreas: Unremarkable. No pancreatic ductal dilatation or
surrounding inflammatory changes.

Spleen: Normal in size without focal abnormality.

Adrenals/Urinary Tract: The adrenal glands are within normal limits.
Kidneys are well visualized bilaterally. No renal calculi or
obstructive changes are seen. The bladder is partially distended.

Stomach/Bowel: The appendix is within normal limits. Some mild
gaseous distension of the colon is seen although no true obstructive
changes are noted. There are changes noted in the mid to distal
jejunum on the left consistent with intussusception. This is best
visualized on image numbers 57 through 66 of series 3 as well as
image number 35 of series 6. This was not identified on the prior
exam and is likely the etiology of the patient's underlying
discomfort. No definitive mass lesion is identified although
distention of the intussusceptum is incomplete. No definitive
obstructive changes are noted secondary to this. Postsurgical
changes are noted in the stomach stable from the previous exam. Mild
sliding-type hiatal hernia is seen.

Vascular/Lymphatic: No significant vascular findings are present. No
enlarged abdominal or pelvic lymph nodes.

Reproductive: Uterus and bilateral adnexa are unremarkable.

Other: No abdominal wall hernia or abnormality. No abdominopelvic
ascites.

Musculoskeletal: Degenerative changes of lumbar spine are seen. No
acute bony abnormality is noted.
IMPRESSION: Changes consistent with intussusception within the mid jejunum. This
is likely the etiology of the patient's given clinical
symptomatology. No obstructive changes are noted. No definitive
focal mass is seen although distention of the intussusceptum is
somewhat limited.

Cholelithiasis without complicating factors.

## 2019-02-24 ENCOUNTER — Ambulatory Visit (INDEPENDENT_AMBULATORY_CARE_PROVIDER_SITE_OTHER): Payer: Self-pay | Admitting: *Deleted

## 2019-02-24 ENCOUNTER — Other Ambulatory Visit: Payer: Self-pay

## 2019-02-24 DIAGNOSIS — I428 Other cardiomyopathies: Secondary | ICD-10-CM

## 2019-02-24 DIAGNOSIS — R55 Syncope and collapse: Secondary | ICD-10-CM

## 2019-02-24 LAB — CUP PACEART REMOTE DEVICE CHECK
Date Time Interrogation Session: 20200412133842
Implantable Pulse Generator Implant Date: 20190925

## 2019-03-04 ENCOUNTER — Telehealth: Payer: Self-pay | Admitting: Internal Medicine

## 2019-03-04 ENCOUNTER — Telehealth: Payer: Self-pay | Admitting: Physician Assistant

## 2019-03-04 DIAGNOSIS — I428 Other cardiomyopathies: Secondary | ICD-10-CM

## 2019-03-04 MED ORDER — FUROSEMIDE 40 MG PO TABS: ORAL_TABLET | ORAL | 2 refills | Status: DC

## 2019-03-04 NOTE — Telephone Encounter (Signed)
Pt calling stating that she is having swelling in he feet and ankles and would like a refill on furosemide. Dr. Graciela Husbands does not have the pt taking this medication daily. Would Dr. Graciela Husbands like to refill this medication? Pt would like a call back concerning this matter. Please address

## 2019-03-04 NOTE — Telephone Encounter (Signed)
Spoke with pt regarding Dr Odessa Fleming recommendation. She will begin with taking 40mg  lasix qd x 2 days. She will call if she begins to use this more frequently.

## 2019-03-04 NOTE — Telephone Encounter (Addendum)
   Pt called answering service wanting clarification on prescription called in earlier - I reiterated instructions as outlined in the chart. I had her write them down so that she could refer back to them. I also relayed that rx had already been sent into her pharmacy. She had questions about her prior diagnosis of CHF and I answered her questions to the best of my ability based on chart review. Also reiterated 2g sodium, 2L fluid restrictions. She seems as though she would like to walk through her progress with diuresis with one of our staff for direction on how she is responding to the medicine as she is a bit nervous navigating this on her own, so I encouraged her to call back and speak with a nurse to give update on how she is doing after 1-2 days on the diuretic. She was also encouraged to reach out sooner if symptoms worsen or do not begin to improve. She verbalized gratitude for call back. Liat Mayol PA-C

## 2019-03-04 NOTE — Telephone Encounter (Signed)
That would be fine Disp # 10 1 pill daily up to 2 x week  Disp 8   Refills two  thx

## 2019-03-05 NOTE — Telephone Encounter (Signed)
Noted    A little curious because my impression is taht she is a smart and savvy lady, so surprised that the understanding is a challenge

## 2019-03-07 NOTE — Progress Notes (Signed)
[facility]link Summary Report / Loop Recorder 

## 2019-03-27 ENCOUNTER — Telehealth: Payer: Self-pay

## 2019-03-27 NOTE — Telephone Encounter (Signed)
I spoke with pt. She reports swelling has improved but she is still retaining fluid.  Ankles no longer painful. No shortness of breath.  She has been taking lasix twice a week and has 2-3 tablets left.  I let her know prescription has refills. Pt has android phone and can do video visit. She would like [place] to check with Dr. Graciela Husbands and see if video is OK or if she needs in person appt.

## 2019-03-27 NOTE — Telephone Encounter (Signed)
I spoke with Julia Wheeler and went over details for video visit with Dr. Graciela Husbands on May 18,2020.  Will send consent to her through my chart.

## 2019-03-27 NOTE — Telephone Encounter (Signed)
I called and spoke with patient about upcoming appointment on 5/18//20. I was trying to switch her over to a video visit. She states that her ankles are still swollen, no pain with swelling, but Dr. Graciela Husbands gave her some Furosemide and she wants to know if she needs to continue this? If so she wants a refill. But she would like to talk to a nurse. She states that the swelling is not going down. If he is ok with a video visit she would be ok with that, but she is still retaining fluid. So she doesn't know if she needs to be seen or a video visit will be ok?

## 2019-03-27 NOTE — Telephone Encounter (Signed)
telehealth visit  Would be fine, Dennie Bible  Thanks  And hope you guys are all well

## 2019-03-28 ENCOUNTER — Other Ambulatory Visit: Payer: Self-pay

## 2019-03-28 ENCOUNTER — Ambulatory Visit (INDEPENDENT_AMBULATORY_CARE_PROVIDER_SITE_OTHER): Payer: Self-pay | Admitting: *Deleted

## 2019-03-28 DIAGNOSIS — R55 Syncope and collapse: Secondary | ICD-10-CM

## 2019-03-29 LAB — CUP PACEART REMOTE DEVICE CHECK
Date Time Interrogation Session: 20200515134145
Implantable Pulse Generator Implant Date: 20190925

## 2019-03-31 ENCOUNTER — Telehealth: Payer: Self-pay | Admitting: Internal Medicine

## 2019-03-31 ENCOUNTER — Other Ambulatory Visit: Payer: Self-pay

## 2019-03-31 ENCOUNTER — Telehealth (INDEPENDENT_AMBULATORY_CARE_PROVIDER_SITE_OTHER): Payer: Medicaid Other | Admitting: Internal Medicine

## 2019-03-31 VITALS — BP 159/105 | Ht 67.0 in | Wt 173.0 lb

## 2019-03-31 DIAGNOSIS — I420 Dilated cardiomyopathy: Secondary | ICD-10-CM

## 2019-03-31 DIAGNOSIS — R55 Syncope and collapse: Secondary | ICD-10-CM

## 2019-03-31 DIAGNOSIS — I428 Other cardiomyopathies: Secondary | ICD-10-CM

## 2019-03-31 DIAGNOSIS — R609 Edema, unspecified: Secondary | ICD-10-CM

## 2019-03-31 MED ORDER — MAGNESIUM OXIDE 400 MG PO TABS: 400.0000 mg | ORAL_TABLET | Freq: Every day | ORAL | Status: AC

## 2019-03-31 MED ORDER — FUROSEMIDE 40 MG PO TABS: ORAL_TABLET | ORAL | 3 refills | Status: DC

## 2019-03-31 NOTE — Telephone Encounter (Signed)
New message:    Patient calling concerning her appt this afternoon.please call patient.

## 2019-03-31 NOTE — Progress Notes (Signed)
[facility]link Summary Report / Loop Recorder 

## 2019-03-31 NOTE — Telephone Encounter (Signed)
Pt calling to make sure she has her virtual consent sent. See MyChart encounter.
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
## 2019-04-21 ENCOUNTER — Telehealth: Payer: Self-pay | Admitting: Internal Medicine

## 2019-04-21 NOTE — Telephone Encounter (Signed)
Spoke with pt who states she is getting overheated when wearing a mask. She states she has no choice but to wear her mask when she is out as she takes [employer] everywhere since she cannot drive right now. She says she cannot wear the mask while inside the store, she cannot tolerate it when moving around. She is wearing a plain surgical mask, not an N95. I advised her the surgical mask allows for sufficient oxygen to be inhlaled, so she does not need to worry about not getting enough oxygen.   She states she also gets overheated when she cooks. So much that she needs to sit down because she becomes nauseated from the heat. She wants to know what she can do to make her life "more normal." She feels she cannot do her ADL's without discomfort or the fear of passing out.   When asked about her BP's, she states they are "still high."   In order to combat symptoms, I encouraged her to wear her compression stockings, thigh sleeves, abdominal binder, etc. I encouraged her to take her time when moving position to position. I encouraged her to use grocery delivery services if possible; cook several meals at once during cooler parts of the day. If she has to leave the house, try running her errands in the morning before it gets hot. That way she can rest in the afternoon as well.   She states she is increasingly frustrated and needs to find a solution to her problem. I discussed the importance of compliance as well as being patient with herself while trying to navigate a solution for her symptoms.   I told her I would be forwarding her concerns to Dr Caryl Comes, however he is out of the office until next week. She understands we may not get back to her until next week.

## 2019-04-21 NOTE — Telephone Encounter (Signed)
New Message:      Pt says she feels so uncomfortable wearing her Mask, When she wears it, and sometimes when she is not wearing it the patient says she  feels like she is going to pass out. She wonder what she can do, she wonder if her blood medicine might need to be changed.pS

## 2019-04-25 ENCOUNTER — Telehealth: Payer: Self-pay | Admitting: Internal Medicine

## 2019-04-25 NOTE — Telephone Encounter (Signed)
Soke to pt regarding alert for episode of Tachy episode  Lasting 45 seconds that occurred at 07:57 am this morning. Pt stated she had no sx at the time of event. She has not taken her Toprol XL this morning. Stated she felt "light- headed" a few minutes ago after walking to [place] but states she does not have CP, SHOB, or feel like her HR is elevated at this time.

## 2019-04-25 NOTE — Telephone Encounter (Signed)
C  Good am Two thoughts 1) what is the vector of her sinus p wave,  This tracing looks like something called PJRT  We can talk about that 2) we should reporgram her detect rate to say 170    Its hard to know how this start, as it gradually merges into her detection zone-- if its sinus, which would be unlikely for a 21-53 yo woman, then we should see it move gradually from 170's to 180's  O/w it should be abrupt  Thanks sk

## 2019-04-30 ENCOUNTER — Ambulatory Visit (INDEPENDENT_AMBULATORY_CARE_PROVIDER_SITE_OTHER): Payer: Self-pay | Admitting: *Deleted

## 2019-04-30 DIAGNOSIS — R55 Syncope and collapse: Secondary | ICD-10-CM

## 2019-04-30 LAB — CUP PACEART REMOTE DEVICE CHECK
Date Time Interrogation Session: 20200617135354
Implantable Pulse Generator Implant Date: 20190925

## 2019-04-30 NOTE — Telephone Encounter (Signed)
If she can do orthostatics at home, that might be helpful  The problem with the mask is a tough one, important to maintain vigilance with COVID

## 2019-05-02 ENCOUNTER — Telehealth: Payer: Self-pay | Admitting: Cardiology

## 2019-05-02 NOTE — Telephone Encounter (Signed)
Spoke w/ pt and requested that she send a manual transmission w/ her home monitor.  Transmission received.  

## 2019-05-02 NOTE — Telephone Encounter (Signed)
Manual transmission reviewed. "Symptom" episode from 04/27/19 at 15:41 shows ST at 103bpm. Tachy episode also noted on 05/01/19 at 11:28, duration 28sec, median V rate 182bpm.   Pt denies symptoms with tachy episode, reports she was likely taking her trash out at that time. Pt reports that during symptom episodes she feels her heart feels beating rapidly, feels very weak like she's going to "drop". Occurs when she's standing at the sink washing dishes. She also notes that when she brings the trash out from 2nd floor apartment (8 stairs and 6 stairs), she feels ShOB (ongoing issue). Overall, she just doesn't feel good. Ankles swell, doesn't feel like she can prop her feet up due to [facility] when laying down (uses 4 pillows at baseline). Weight is up 3lb over the past few days, abdomen bloated, hasn't tried PRN furosemide.  Encouraged pt to take PRN furosemide as instructed. Reviewed importance of sodium and fluid restriction--pt often drinks Gatorade. Discussed thigh sleeves and encouraged isometric leg contractions while standing as instructed by Dr. Caryl Comes during 5/18 virtual visit. Advised to call back next week if symptoms don't improve. Advised I'll route this message and ECGs to Dr. Caryl Comes for review. Pt verbalizes understanding and thanked me for my call.

## 2019-05-04 NOTE — Telephone Encounter (Signed)
Prob needs OV  Raquel Sarna Thanks SK

## 2019-05-05 NOTE — Telephone Encounter (Signed)
Patient agreeable to an appointment with Dr. Caryl Comes on 05/21/19 at 1:00pm. She is aware of office address and denies additional questions or concerns at this time.

## 2019-05-12 ENCOUNTER — Telehealth: Payer: Self-pay | Admitting: Internal Medicine

## 2019-05-12 NOTE — Telephone Encounter (Signed)
New message   Patient would like to discuss about getting on disability per the patient's heart condition(syncope & congestive heart failure). Please call to discuss.

## 2019-05-13 NOTE — Telephone Encounter (Signed)
LVM for pt to return call

## 2019-05-14 NOTE — Progress Notes (Signed)
[facility]link Summary Report / Loop Recorder 

## 2019-05-15 ENCOUNTER — Emergency Department (HOSPITAL_COMMUNITY): Payer: Self-pay

## 2019-05-15 ENCOUNTER — Emergency Department (HOSPITAL_COMMUNITY)
Admission: EM | Admit: 2019-05-15 | Discharge: 2019-05-15 | Disposition: A | Discharge status: Home / Self Care | Payer: Self-pay | Attending: Emergency Medicine | Admitting: Emergency Medicine

## 2019-05-15 ENCOUNTER — Other Ambulatory Visit: Payer: Self-pay

## 2019-05-15 ENCOUNTER — Encounter (HOSPITAL_COMMUNITY): Payer: Self-pay | Admitting: Emergency Medicine

## 2019-05-15 DIAGNOSIS — Y939 Activity, unspecified: Secondary | ICD-10-CM | POA: Insufficient documentation

## 2019-05-15 DIAGNOSIS — Y999 Unspecified external cause status: Secondary | ICD-10-CM | POA: Insufficient documentation

## 2019-05-15 DIAGNOSIS — S2232XA Fracture of one rib, left side, initial encounter for closed fracture: Secondary | ICD-10-CM | POA: Insufficient documentation

## 2019-05-15 DIAGNOSIS — I509 Heart failure, unspecified: Secondary | ICD-10-CM | POA: Insufficient documentation

## 2019-05-15 DIAGNOSIS — D509 Iron deficiency anemia, unspecified: Secondary | ICD-10-CM | POA: Insufficient documentation

## 2019-05-15 DIAGNOSIS — Z91030 Bee allergy status: Secondary | ICD-10-CM | POA: Insufficient documentation

## 2019-05-15 DIAGNOSIS — R0602 Shortness of breath: Secondary | ICD-10-CM | POA: Insufficient documentation

## 2019-05-15 DIAGNOSIS — I110 Hypertensive heart disease with heart failure: Secondary | ICD-10-CM | POA: Insufficient documentation

## 2019-05-15 DIAGNOSIS — Y929 Unspecified place or not applicable: Secondary | ICD-10-CM | POA: Insufficient documentation

## 2019-05-15 DIAGNOSIS — Z79899 Other long term (current) drug therapy: Secondary | ICD-10-CM | POA: Insufficient documentation

## 2019-05-15 DIAGNOSIS — Z888 Allergy status to other drugs, medicaments and biological substances status: Secondary | ICD-10-CM | POA: Insufficient documentation

## 2019-05-15 DIAGNOSIS — F1010 Alcohol abuse, uncomplicated: Secondary | ICD-10-CM | POA: Insufficient documentation

## 2019-05-15 DIAGNOSIS — X58XXXA Exposure to other specified factors, initial encounter: Secondary | ICD-10-CM | POA: Insufficient documentation

## 2019-05-15 DIAGNOSIS — R0789 Other chest pain: Secondary | ICD-10-CM | POA: Insufficient documentation

## 2019-05-15 LAB — CBC
HCT: 33 % — ABNORMAL LOW (ref 36.0–46.0)
Hemoglobin: 9.9 g/dL — ABNORMAL LOW (ref 12.0–15.0)
MCH: 22 pg — ABNORMAL LOW (ref 26.0–34.0)
MCHC: 30 g/dL (ref 30.0–36.0)
MCV: 73.5 fL — ABNORMAL LOW (ref 80.0–100.0)
Platelets: 320 10*3/uL (ref 150–400)
RBC: 4.49 MIL/uL (ref 3.87–5.11)
RDW: 19.1 % — ABNORMAL HIGH (ref 11.5–15.5)
WBC: 5.5 10*3/uL (ref 4.0–10.5)
nRBC: 0 % (ref 0.0–0.2)

## 2019-05-15 LAB — CBG MONITORING, ED: Glucose-Capillary: 110 mg/dL — ABNORMAL HIGH (ref 70–99)

## 2019-05-15 LAB — TROPONIN I (HIGH SENSITIVITY)
Troponin I (High Sensitivity): 4 ng/L (ref <18)
Troponin I (High Sensitivity): 3 ng/L (ref <18)

## 2019-05-15 LAB — BASIC METABOLIC PANEL
Anion gap: 18 — ABNORMAL HIGH (ref 5–15)
BUN: 10 mg/dL (ref 6–20)
CO2: 21 mmol/L — ABNORMAL LOW (ref 22–32)
Calcium: 9.3 mg/dL (ref 8.9–10.3)
Chloride: 102 mmol/L (ref 98–111)
Creatinine, Ser: 0.71 mg/dL (ref 0.44–1.00)
GFR calc Af Amer: >60 mL/min (ref >60)
GFR calc non Af Amer: >60 mL/min (ref >60)
Glucose, Bld: 49 mg/dL — ABNORMAL LOW (ref 70–99)
Potassium: 3.6 mmol/L (ref 3.5–5.1)
Sodium: 141 mmol/L (ref 135–145)

## 2019-05-15 LAB — I-STAT BETA HCG BLOOD, ED (MC, WL, AP ONLY): I-stat hCG, quantitative: <5 m[IU]/mL (ref <5)

## 2019-05-15 LAB — LACTIC ACID, PLASMA: Lactic Acid, Venous: 3.5 mmol/L (ref 0.5–1.9)

## 2019-05-15 MED ORDER — LORAZEPAM 2 MG/ML IJ SOLN
1.0000 mg | Freq: Once | INTRAMUSCULAR | Status: AC
  Administered 2019-05-15: 1 mg via INTRAVENOUS
  Filled 2019-05-15: qty 1

## 2019-05-15 MED ORDER — BUPIVACAINE HCL (PF) 0.5 % IJ SOLN
20.0000 mL | Freq: Once | INTRAMUSCULAR | Status: AC
  Administered 2019-05-15: 20 mL
  Filled 2019-05-15: qty 20

## 2019-05-15 MED ORDER — IOHEXOL 350 MG/ML SOLN
80.0000 mL | Freq: Once | INTRAVENOUS | Status: AC | PRN
  Administered 2019-05-15: 80 mL via INTRAVENOUS

## 2019-05-15 MED ORDER — SODIUM CHLORIDE 0.9% FLUSH: 3.0000 mL | Freq: Once | INTRAVENOUS | Status: DC

## 2019-05-15 MED ORDER — SODIUM CHLORIDE 0.9 % IV BOLUS
1000.0000 mL | Freq: Once | INTRAVENOUS | Status: AC
  Administered 2019-05-15: 1000 mL via INTRAVENOUS

## 2019-05-15 MED ORDER — LIDOCAINE 5 % EX PTCH
1.0000 | MEDICATED_PATCH | CUTANEOUS | Status: DC
  Administered 2019-05-15: 1 via TRANSDERMAL
  Filled 2019-05-15: qty 1

## 2019-05-15 NOTE — ED Triage Notes (Signed)
Pts family- Elane Fritz  [phone]

## 2019-05-15 NOTE — ED Notes (Signed)
Patient transported to X-ray 

## 2019-05-15 NOTE — ED Notes (Signed)
MD williams aware this RN unable to start an IV, states he will attempt with ultrasound.

## 2019-05-15 NOTE — ED Triage Notes (Signed)
Pt states she started having sharp pain under her left arm. Pain has persisted since yesterday. Pt says she has been drinking alcohol since yesterday. Denies SOB. Pain worsens with movement.

## 2019-05-15 NOTE — ED Notes (Signed)
Unsuccessful blood draw attempt. RN informed 

## 2019-05-15 NOTE — ED Notes (Signed)
Notified Dr. Jimmye Norman of pt's lactate, Dr. Jimmye Norman stated we will hang 2nd liter of ns when first liter is finished.
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
## 2019-05-16 ENCOUNTER — Telehealth: Payer: Self-pay | Admitting: Cardiology

## 2019-05-16 MED ORDER — LIDOCAINE 5 % EX PTCH: 1.0000 | MEDICATED_PATCH | CUTANEOUS | 0 refills | Status: DC

## 2019-05-16 NOTE — Telephone Encounter (Signed)
Received outpatient page in which the patient stated she was seen in the emergency department yesterday, 05/15/2019 with chest pain however was found to have a fractured rib per CTA in the exact location of her pain.  She ruled out for ACS and was discharged home.  She is calling the outpatient paging service today for pain medication.  We discussed that we typically do not prescribe narcotics over the telephone without physical assessment and I recommended that she call her PCPs office or visit an urgent care center if possible.  Further recommendations included Tylenol and ice/heating packs as well as rest.  Patient agrees with recommendations.   Kathyrn Drown NP-C Power Pager: [phone]

## 2019-05-19 ENCOUNTER — Telehealth: Payer: Self-pay | Admitting: Internal Medicine
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
## 2019-05-19 NOTE — Telephone Encounter (Signed)
  Patient had EKG at [facility] ED last Thursday. She has a pacer check appt on 05/21/19 and wants to know since the EKG was done last week can she put off this appt? She has a broken rib and does not want to come unless she really needs to.

## 2019-05-19 NOTE — Telephone Encounter (Signed)
Appointment is with Dr. Caryl Comes to discuss presyncopal episodes and symptom episodes on LINQ. Routed to [employer], Therapist, sports to address.

## 2019-05-21 ENCOUNTER — Telehealth: Payer: Self-pay | Admitting: Internal Medicine

## 2019-05-21 ENCOUNTER — Other Ambulatory Visit: Payer: Self-pay

## 2019-05-21 ENCOUNTER — Encounter: Payer: Self-pay | Admitting: Internal Medicine

## 2019-05-21 VITALS — Ht 67.0 in | Wt 164.0 lb

## 2019-05-21 DIAGNOSIS — R609 Edema, unspecified: Secondary | ICD-10-CM

## 2019-05-21 DIAGNOSIS — R55 Syncope and collapse: Secondary | ICD-10-CM

## 2019-05-21 DIAGNOSIS — I420 Dilated cardiomyopathy: Secondary | ICD-10-CM

## 2019-05-21 DIAGNOSIS — I10 Essential (primary) hypertension: Secondary | ICD-10-CM

## 2019-05-21 NOTE — Progress Notes (Signed)
Erroneous encounter

## 2019-06-02 ENCOUNTER — Ambulatory Visit (INDEPENDENT_AMBULATORY_CARE_PROVIDER_SITE_OTHER): Payer: Self-pay | Admitting: *Deleted

## 2019-06-02 ENCOUNTER — Telehealth: Payer: Self-pay | Admitting: Internal Medicine

## 2019-06-02 DIAGNOSIS — R55 Syncope and collapse: Secondary | ICD-10-CM

## 2019-06-02 LAB — CUP PACEART REMOTE DEVICE CHECK
Date Time Interrogation Session: 20200720154009
Implantable Pulse Generator Implant Date: 20190925

## 2019-06-02 NOTE — Telephone Encounter (Signed)
New Message ° ° ° °Left message to confirm appt and answer covid questions  °
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
## 2019-06-03 ENCOUNTER — Ambulatory Visit (INDEPENDENT_AMBULATORY_CARE_PROVIDER_SITE_OTHER): Payer: Self-pay | Admitting: Internal Medicine

## 2019-06-03 ENCOUNTER — Other Ambulatory Visit: Payer: Self-pay

## 2019-06-03 ENCOUNTER — Encounter: Payer: Self-pay | Admitting: Internal Medicine

## 2019-06-03 VITALS — BP 115/110 | HR 108 | Ht 67.0 in | Wt 167.0 lb

## 2019-06-03 DIAGNOSIS — R55 Syncope and collapse: Secondary | ICD-10-CM

## 2019-06-03 DIAGNOSIS — I951 Orthostatic hypotension: Secondary | ICD-10-CM

## 2019-06-03 DIAGNOSIS — I420 Dilated cardiomyopathy: Secondary | ICD-10-CM

## 2019-06-03 DIAGNOSIS — I428 Other cardiomyopathies: Secondary | ICD-10-CM

## 2019-06-03 LAB — CUP PACEART INCLINIC DEVICE CHECK
Date Time Interrogation Session: 20200721180745
Implantable Pulse Generator Implant Date: 20190925

## 2019-06-03 MED ORDER — METOPROLOL SUCCINATE ER 50 MG PO TB24: 50.0000 mg | ORAL_TABLET | Freq: Every day | ORAL | 3 refills | Status: DC

## 2019-06-03 MED ORDER — CLONIDINE HCL 0.1 MG PO TABS: 0.1000 mg | ORAL_TABLET | Freq: Two times a day (BID) | ORAL | 3 refills | Status: DC

## 2019-06-03 MED ORDER — METOPROLOL SUCCINATE ER 50 MG PO TB24: 50.0000 mg | ORAL_TABLET | Freq: Every day | ORAL | 3 refills | Status: AC

## 2019-06-03 NOTE — Patient Instructions (Signed)
Medication Instructions:  Your physician has recommended you make the following change in your medication:   1. Discontinue your Hydralazine today.  2. Friday, Increase your metoprolol Succinate to 50 mg, two tablets of your current dose, once daily  3. Tues 7/28, Begin Clonidine, 0.1mg  tablet, two times per day.   Labwork: None ordered.  Testing/Procedures: None ordered.  Follow-Up:  You have a follow up appointment scheduled for October 23 @ 1:45pm  Any Other Special Instructions Will Be Listed Below (If Applicable).     If you need a refill on your cardiac medications before your next appointment, please call your pharmacy.

## 2019-06-05 ENCOUNTER — Telehealth: Payer: Self-pay

## 2019-06-05 NOTE — Telephone Encounter (Signed)
We received a PA request from [facility] on the pts Clonidine.  We have no insurance information on file for this pt so I called [employer] representative. I was adviced that they dont have any insurance info either and that the pt must have used a discount card when inquiring about the cost which triggered this request.  I called the pt and she explained that she has no insurance coverage on her medication and is self pay. She states that she can afford her Clonidine as Dr Caryl Comes looked up the cost of Clonidine while the pt was at her OV with him on 7/21 and found that Clonidine is on the $4 dollar list at [facility].  She thanked me for calling to check on her and expressed much gratitude for Dr Caryl Comes for always taking good care of her.

## 2019-06-08 ENCOUNTER — Other Ambulatory Visit: Payer: Self-pay

## 2019-06-08 ENCOUNTER — Emergency Department (HOSPITAL_COMMUNITY)
Admission: EM | Admit: 2019-06-08 | Discharge: 2019-06-08 | Disposition: A | Discharge status: Home / Self Care | Payer: Medicaid Other | Attending: Emergency Medicine | Admitting: Emergency Medicine

## 2019-06-08 ENCOUNTER — Emergency Department (HOSPITAL_COMMUNITY): Payer: Medicaid Other

## 2019-06-08 DIAGNOSIS — Y9389 Activity, other specified: Secondary | ICD-10-CM | POA: Insufficient documentation

## 2019-06-08 DIAGNOSIS — R55 Syncope and collapse: Secondary | ICD-10-CM | POA: Insufficient documentation

## 2019-06-08 DIAGNOSIS — Y999 Unspecified external cause status: Secondary | ICD-10-CM | POA: Insufficient documentation

## 2019-06-08 DIAGNOSIS — W1839XA Other fall on same level, initial encounter: Secondary | ICD-10-CM | POA: Insufficient documentation

## 2019-06-08 DIAGNOSIS — S92345A Nondisplaced fracture of fourth metatarsal bone, left foot, initial encounter for closed fracture: Secondary | ICD-10-CM

## 2019-06-08 DIAGNOSIS — I509 Heart failure, unspecified: Secondary | ICD-10-CM | POA: Insufficient documentation

## 2019-06-08 DIAGNOSIS — Y92007 Garden or yard of unspecified non-institutional (private) residence as the place of occurrence of the external cause: Secondary | ICD-10-CM | POA: Insufficient documentation

## 2019-06-08 DIAGNOSIS — S92325A Nondisplaced fracture of second metatarsal bone, left foot, initial encounter for closed fracture: Secondary | ICD-10-CM | POA: Insufficient documentation

## 2019-06-08 DIAGNOSIS — Z7982 Long term (current) use of aspirin: Secondary | ICD-10-CM | POA: Insufficient documentation

## 2019-06-08 DIAGNOSIS — S92335A Nondisplaced fracture of third metatarsal bone, left foot, initial encounter for closed fracture: Secondary | ICD-10-CM | POA: Insufficient documentation

## 2019-06-08 DIAGNOSIS — I110 Hypertensive heart disease with heart failure: Secondary | ICD-10-CM | POA: Insufficient documentation

## 2019-06-08 DIAGNOSIS — S92355A Nondisplaced fracture of fifth metatarsal bone, left foot, initial encounter for closed fracture: Secondary | ICD-10-CM

## 2019-06-08 DIAGNOSIS — E876 Hypokalemia: Secondary | ICD-10-CM | POA: Insufficient documentation

## 2019-06-08 LAB — BASIC METABOLIC PANEL
Anion gap: 15 (ref 5–15)
BUN: 12 mg/dL (ref 6–20)
CO2: 27 mmol/L (ref 22–32)
Calcium: 9.6 mg/dL (ref 8.9–10.3)
Chloride: 101 mmol/L (ref 98–111)
Creatinine, Ser: 0.79 mg/dL (ref 0.44–1.00)
GFR calc Af Amer: >60 mL/min (ref >60)
GFR calc non Af Amer: >60 mL/min (ref >60)
Glucose, Bld: 79 mg/dL (ref 70–99)
Potassium: 3.4 mmol/L — ABNORMAL LOW (ref 3.5–5.1)
Sodium: 143 mmol/L (ref 135–145)

## 2019-06-08 LAB — CBC WITH DIFFERENTIAL/PLATELET
Abs Immature Granulocytes: 0.01 10*3/uL (ref 0.00–0.07)
Basophils Absolute: 0 10*3/uL (ref 0.0–0.1)
Basophils Relative: 1 %
Eosinophils Absolute: 0 10*3/uL (ref 0.0–0.5)
Eosinophils Relative: 0 %
HCT: 31.1 % — ABNORMAL LOW (ref 36.0–46.0)
Hemoglobin: 9.5 g/dL — ABNORMAL LOW (ref 12.0–15.0)
Immature Granulocytes: 0 %
Lymphocytes Relative: 20 %
Lymphs Abs: 1.1 10*3/uL (ref 0.7–4.0)
MCH: 22.7 pg — ABNORMAL LOW (ref 26.0–34.0)
MCHC: 30.5 g/dL (ref 30.0–36.0)
MCV: 74.2 fL — ABNORMAL LOW (ref 80.0–100.0)
Monocytes Absolute: 0.6 10*3/uL (ref 0.1–1.0)
Monocytes Relative: 12 %
Neutro Abs: 3.6 10*3/uL (ref 1.7–7.7)
Neutrophils Relative %: 67 %
Platelets: 245 10*3/uL (ref 150–400)
RBC: 4.19 MIL/uL (ref 3.87–5.11)
RDW: 21.2 % — ABNORMAL HIGH (ref 11.5–15.5)
WBC: 5.3 10*3/uL (ref 4.0–10.5)
nRBC: 0 % (ref 0.0–0.2)

## 2019-06-08 LAB — ETHANOL: Alcohol, Ethyl (B): 171 mg/dL — ABNORMAL HIGH (ref <10)

## 2019-06-08 MED ORDER — POTASSIUM CHLORIDE CRYS ER 20 MEQ PO TBCR
40.0000 meq | EXTENDED_RELEASE_TABLET | Freq: Once | ORAL | Status: AC
  Administered 2019-06-08: 40 meq via ORAL
  Filled 2019-06-08: qty 2

## 2019-06-08 MED ORDER — OXYCODONE-ACETAMINOPHEN 5-325 MG PO TABS: 1.0000 | ORAL_TABLET | ORAL | 0 refills | Status: DC | PRN

## 2019-06-08 MED ORDER — SODIUM CHLORIDE 0.9 % IV BOLUS
500.0000 mL | Freq: Once | INTRAVENOUS | Status: AC
  Administered 2019-06-08: 500 mL via INTRAVENOUS

## 2019-06-08 MED ORDER — HYDROCODONE-ACETAMINOPHEN 5-325 MG PO TABS
1.0000 | ORAL_TABLET | Freq: Once | ORAL | Status: AC
  Administered 2019-06-08: 1 via ORAL
  Filled 2019-06-08: qty 1

## 2019-06-08 NOTE — ED Notes (Signed)
Pt verbalized understanding of D/C instructions and follow up. Ankle boot placed and crutches provided. Pt verbalized understanding of how to properly use crutches. Spirometer provided with understanding . Pt wheeled to lobby for safety.

## 2019-06-08 NOTE — Progress Notes (Signed)
Orthopedic Tech Progress Note Patient Details:  Julia Wheeler 1966-03-23 [id number]  Ortho Devices Type of Ortho Device: Crutches, CAM walker Ortho Device/Splint Location: left Ortho Device/Splint Interventions: Application   Post Interventions Patient Tolerated: Well, Ambulated well Instructions Provided: Care of device   [place] Pink 06/08/2019, 10:56 AM

## 2019-06-08 NOTE — ED Notes (Signed)
Patient transported to X-ray 

## 2019-06-08 NOTE — Discharge Instructions (Signed)
Rest - please stay off left foot as much as possible Ice - ice for 20 minutes at a time, several times a day Wear boot at all times when walking Elevate - elevate left leg above level of heart to help reduce swelling Take Percocet every 4-5 hours for severe pain. Otherwise you can take Ibuprofen or Tylenol for pain Please follow up with orthopedics Use incentive spirometer every 2 hours while awake to prevent pneumonia
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
## 2019-06-08 NOTE — ED Triage Notes (Signed)
Pt arrives EMS with complaints of a syncopal episode last night. Pt next memory was being on the ground with foot pain. Pt endorsees HX of CHF and syncopal episodes happen fairly often. Pt also stated she has broken ribs d/t moving furniture a few weeks ago.

## 2019-06-12 ENCOUNTER — Telehealth: Payer: Self-pay

## 2019-06-12 NOTE — Telephone Encounter (Signed)
Spoke with patient to advise of disconnected monitor. 

## 2019-06-13 ENCOUNTER — Telehealth: Payer: Self-pay | Admitting: *Deleted

## 2019-06-13 NOTE — Telephone Encounter (Signed)
Spoke with patient regarding symptoms episodes from 7/18 and 7/20 and tachy episodes from 06/08/19. Pt reports during symptom episodes she feels unsteady, feels her heart beating faster, feels like she is going to pass out. Symptom episode ECGs appear SR with occasional PVCs and ST. Tachy episodes from 06/08/19 at 00:36 and 00:37 appear SVT, median V rates 182bpm. Pt reports she was asleep at the time of these episodes, went to bed around 23:00 on 7/25, went to the ED on 7/26 as she fractured her foot due to a fall during a syncopal episode on 7/25. Advised pt I will route this message to Dr. Caryl Comes for review and recommendations. She verbalizes understanding and agreement with plan. No further questions at this time.

## 2019-06-17 NOTE — Progress Notes (Signed)
[facility]link Summary Report / Loop Recorder 

## 2019-06-18 NOTE — Telephone Encounter (Signed)
What a challenge-=- Can we reprogram the tachy detection rate to try and catch onset I will need to do some reading as to whether CLS can be of any benefit for pts without and bradycardia Raquel Sarna Thanks SK

## 2019-07-01 NOTE — Telephone Encounter (Signed)
Patient is scheduled for DC appointment on 07/15/19 at 4:00pm.

## 2019-07-07 ENCOUNTER — Ambulatory Visit (INDEPENDENT_AMBULATORY_CARE_PROVIDER_SITE_OTHER): Payer: Self-pay | Admitting: *Deleted

## 2019-07-07 DIAGNOSIS — R55 Syncope and collapse: Secondary | ICD-10-CM

## 2019-07-08 LAB — CUP PACEART REMOTE DEVICE CHECK
Date Time Interrogation Session: 20200822160849
Implantable Pulse Generator Implant Date: 20190925

## 2019-07-14 NOTE — Progress Notes (Signed)
[facility]link Summary Report / Loop Recorder 

## 2019-07-15 ENCOUNTER — Telehealth: Payer: Self-pay

## 2019-07-15 ENCOUNTER — Telehealth: Payer: Self-pay | Admitting: Internal Medicine

## 2019-07-15 NOTE — Telephone Encounter (Signed)
Patient needs to reschedule her appt with the device clinic to come in.  I reached out to them, they said you can reschedule this patient.

## 2019-07-15 NOTE — Telephone Encounter (Signed)

## 2019-07-25 ENCOUNTER — Telehealth: Payer: Self-pay

## 2019-07-25 NOTE — Telephone Encounter (Signed)
Left message for patient to remind of missed remote transmission.  

## 2019-08-07 ENCOUNTER — Encounter: Payer: Self-pay | Admitting: *Deleted

## 2019-08-07 ENCOUNTER — Encounter: Payer: Self-pay | Admitting: Cardiology

## 2019-08-12 ENCOUNTER — Other Ambulatory Visit: Payer: Self-pay

## 2019-08-12 ENCOUNTER — Ambulatory Visit (INDEPENDENT_AMBULATORY_CARE_PROVIDER_SITE_OTHER): Payer: Self-pay | Admitting: Student

## 2019-08-12 DIAGNOSIS — R55 Syncope and collapse: Secondary | ICD-10-CM

## 2019-08-12 LAB — CUP PACEART INCLINIC DEVICE CHECK
Date Time Interrogation Session: 20200929133138
Implantable Pulse Generator Implant Date: 20190925

## 2019-08-12 NOTE — Progress Notes (Signed)
Loop check in clinic. Battery status: OK. R-waves 0.68 mV. 0 symptom episodes, 2 tachy episodes, previously reviewed, appear SVT; 0 pause episodes, 0 brady episodes. 0 AF episodes (0% burden). Tachy detection changed from 176 bpm to 167 bpm, and from 16 beats to 12 beats in attempt to onset of tachycardia. Monthly summary reports and ROV with Dr. Caryl Comes.

## 2019-08-26 ENCOUNTER — Telehealth: Payer: Self-pay | Admitting: Internal Medicine

## 2019-08-26 NOTE — Telephone Encounter (Signed)
Received call from patient. Patient stated she was denied disability claim because documentation in her medical record does not support her claims of syncope. Advised patient to discuss questions about her diagnosis with Dr. Caryl Comes. Patient stated she is scheduled to see Dr. Caryl Comes on 09/05/19 and will have a heart to heart with him at that time.

## 2019-08-29 ENCOUNTER — Telehealth: Payer: Self-pay | Admitting: Internal Medicine

## 2019-08-29 NOTE — Telephone Encounter (Signed)
New Message   Patient is requesting something on [place] indicating that she has had episodes related to CHF. Please advise.

## 2019-08-29 NOTE — Telephone Encounter (Signed)
Pt calling today to request a letter to be faxed to [facility] for an unemployment claim. She is needing a letter stating because of her heart issues, it is not safe for her to work closely with potential COVID exposure and/or in close proximity with patients in their homes.    She understands we do not usually give diagnosis information unless FMLA. She has requested we fax this info to [phone] with First Last Name with last 4 of ss [id number] with claim # [id number].

## 2019-09-03 NOTE — Telephone Encounter (Signed)
Would write " with her cardiomyopathy ( or heart condition )  She might be expected to be at higher risk from complications if she were to get COVID " Thanks SK

## 2019-09-05 ENCOUNTER — Ambulatory Visit (INDEPENDENT_AMBULATORY_CARE_PROVIDER_SITE_OTHER): Payer: Self-pay | Admitting: Internal Medicine

## 2019-09-05 ENCOUNTER — Encounter: Payer: Self-pay | Admitting: Internal Medicine

## 2019-09-05 ENCOUNTER — Other Ambulatory Visit: Payer: Self-pay

## 2019-09-05 VITALS — BP 136/82 | HR 99 | Ht 67.0 in | Wt 170.4 lb

## 2019-09-05 DIAGNOSIS — I951 Orthostatic hypotension: Secondary | ICD-10-CM

## 2019-09-05 DIAGNOSIS — I428 Other cardiomyopathies: Secondary | ICD-10-CM

## 2019-09-05 DIAGNOSIS — R55 Syncope and collapse: Secondary | ICD-10-CM

## 2019-09-05 LAB — CUP PACEART INCLINIC DEVICE CHECK
Date Time Interrogation Session: 20201023181154
Implantable Pulse Generator Implant Date: 20190925
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
## 2019-09-05 NOTE — Telephone Encounter (Signed)
Faxed letter has been sent; pt will be in today to discuss.

## 2019-09-05 NOTE — Patient Instructions (Signed)
Medication Instructions:  Your physician recommends that you continue on your current medications as directed. Please refer to the Current Medication list given to you today.  Labwork: None ordered.  Testing/Procedures: Your physician has requested that you have an echocardiogram. Echocardiography is a painless test that uses sound waves to create images of your heart. It provides your doctor with information about the size and shape of your heart and how well your heart's chambers and valves are working. This procedure takes approximately one hour. There are no restrictions for this procedure.   Follow-Up: Your physician recommends that you schedule a follow-up appointment in:   6 months with Dr. Caryl Comes  Any Other Special Instructions Will Be Listed Below (If Applicable).     If you need a refill on your cardiac medications before your next appointment, please call your pharmacy.

## 2019-09-08 ENCOUNTER — Telehealth: Payer: Self-pay

## 2019-09-08 NOTE — Telephone Encounter (Signed)
Pt aware of Dr. Olin Pia recommendation. She had no additional questions.

## 2019-09-08 NOTE — Telephone Encounter (Signed)
-----   Message from Deboraha Sprang, MD sent at 09/06/2019  7:58 AM EDT ----- L  could you plz ask her to followup with her PCP re her anemia Thanks SK

## 2019-09-09 ENCOUNTER — Telehealth: Payer: Self-pay | Admitting: Student

## 2019-09-09 ENCOUNTER — Ambulatory Visit (INDEPENDENT_AMBULATORY_CARE_PROVIDER_SITE_OTHER): Payer: Medicaid Other | Admitting: *Deleted

## 2019-09-09 DIAGNOSIS — R55 Syncope and collapse: Secondary | ICD-10-CM

## 2019-09-09 NOTE — Telephone Encounter (Signed)
Pt returned [place] phone call. I told her per Jonni Sanger phone note she had 1 symptom event and 4 tachy events. She tried to send a manual transmission with her home monitor but she received [id number]. I told her to let the handle charge for 30 minutes then I will call her back to do the transmission again.

## 2019-09-09 NOTE — Telephone Encounter (Signed)
LMOM to [facility] device clinic.  1 symptom event and 4 tachy events. No ecg for review due to gaps in connection  If she calls back, just please have her send a manual transmission, and we will give her call back after reviewing.   Legrand Como [address] [place], PA-C  09/09/2019 9:23 AM

## 2019-09-09 NOTE — Telephone Encounter (Signed)
Reviewed remote transmission . Episodes and events were reviewed with Dr Caryl Comes at her visit with him on 09/05/19. No new symptoms and no change in condition. Patient to contact PCP due to hx of anemia per Dr Olin Pia earlier recommendation.

## 2019-09-10 LAB — CUP PACEART REMOTE DEVICE CHECK
Date Time Interrogation Session: 20201027181058
Implantable Pulse Generator Implant Date: 20190925

## 2019-09-18 ENCOUNTER — Other Ambulatory Visit: Payer: Self-pay

## 2019-09-18 ENCOUNTER — Ambulatory Visit (HOSPITAL_COMMUNITY): Payer: Self-pay | Attending: Cardiology

## 2019-09-18 DIAGNOSIS — I428 Other cardiomyopathies: Secondary | ICD-10-CM | POA: Insufficient documentation

## 2019-09-24 ENCOUNTER — Telehealth: Payer: Self-pay | Admitting: Internal Medicine

## 2019-09-24 NOTE — Telephone Encounter (Signed)
Patient is calling for her echo results.   

## 2019-09-24 NOTE — Telephone Encounter (Signed)
Returned call to Pt.  Advised Dr. Caryl Comes has not reviewed ECHO results yet.  Advised he would be back in office next week and would dictate on results and office will contact Pt at that time with results.  Pt indicates understanding.

## 2019-09-29 ENCOUNTER — Telehealth: Payer: Self-pay

## 2019-09-29 NOTE — Telephone Encounter (Signed)
Follow up: ° ° ° °Patient returning your call back.  °

## 2019-09-29 NOTE — Telephone Encounter (Signed)
-----   Message from Frederik Schmidt, RN sent at 09/29/2019  8:29 AM EST -----  ----- Message ----- From: Damian Leavell, RN Sent: 09/29/2019   7:44 AM EST To: [place] Triage

## 2019-09-29 NOTE — Telephone Encounter (Signed)
LVM with pt to discuss recent ECHO results; Per Dr. Caryl Comes, he would like to see her in the office to discuss. OK to place in DOD spot.

## 2019-09-30 NOTE — Progress Notes (Signed)
[facility]link Summary Report / Loop Recorder 

## 2019-09-30 NOTE — Telephone Encounter (Signed)
Call returned to Pt.   Advised of echo results.  Pt was not able make upcoming DOD spot as [place].  She also requires a Tuesday visit.  Scheduled Pt for 10/21/2019 in consult spot, as nothing else available for Pt.  Pt indicates understanding and thanked nurse for call back.

## 2019-09-30 NOTE — Telephone Encounter (Signed)
Follow Up  Patient is returning call. Please give patient a call back with echo results.

## 2019-10-06 ENCOUNTER — Ambulatory Visit: Payer: Medicaid Other | Admitting: Internal Medicine

## 2019-10-06 ENCOUNTER — Other Ambulatory Visit: Payer: Self-pay

## 2019-10-06 ENCOUNTER — Telehealth (INDEPENDENT_AMBULATORY_CARE_PROVIDER_SITE_OTHER): Payer: Self-pay | Admitting: Internal Medicine

## 2019-10-06 ENCOUNTER — Emergency Department (HOSPITAL_COMMUNITY): Payer: Self-pay | Admitting: EMERGENCY MEDICINE

## 2019-10-06 VITALS — Wt 170.0 lb

## 2019-10-06 DIAGNOSIS — I428 Other cardiomyopathies: Secondary | ICD-10-CM

## 2019-10-06 DIAGNOSIS — I420 Dilated cardiomyopathy: Secondary | ICD-10-CM

## 2019-10-06 DIAGNOSIS — Z79899 Other long term (current) drug therapy: Secondary | ICD-10-CM

## 2019-10-06 DIAGNOSIS — I10 Essential (primary) hypertension: Secondary | ICD-10-CM

## 2019-10-06 MED ORDER — SPIRONOLACTONE 25 MG PO TABS: 25.0000 mg | ORAL_TABLET | Freq: Every day | ORAL | 3 refills | Status: AC

## 2019-10-06 NOTE — Telephone Encounter (Signed)
Pt had televisit with Dr. Caryl Comes today and discussed her Echo results.

## 2019-10-06 NOTE — Patient Instructions (Signed)
Medication Instructions:  Start Aldactone 25 mg a day sent to the Pharmacy in [place] *If you need a refill on your cardiac medications before your next appointment, please call your pharmacy*  Lab Work: BMET in 2 weeks after starting your medications.. please call [phone] and make appt for the lab... does not have to be fasting    If you have labs (blood work) drawn today and your tests are completely normal, you will receive your results only by: [name] MyChart Message (if you have MyChart) OR . A paper copy in the mail If you have any lab test that is abnormal or we need to change your treatment, we will call you to review the results.  Testing/Procedures: None ordered  Follow-Up: At [facility], you and your health needs are our priority.  As part of our continuing mission to provide you with exceptional heart care, we have created designated Provider Care Teams.  These Care Teams include your primary Cardiologist (physician) and Advanced Practice Providers (APPs -  Physician Assistants and Nurse Practitioners) who all work together to provide you with the care you need, when you need it.  Your next appointment:   12 week(s)  The format for your next appointment:   Virtual Visit   Provider:   Virl Axe, MD  Other Instructions Recheck BP and sent results to My Chart...   Your physician has requested that you regularly monitor and record your blood pressure readings at home. Please use the same machine at the same time of day to check your readings and record them to bring to your follow-up visit.  Try to sit and relax for about 20-30 minutes prior to checking your BP. Please record about twice a day.
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
## 2019-10-21 ENCOUNTER — Other Ambulatory Visit: Payer: Self-pay

## 2019-10-21 ENCOUNTER — Ambulatory Visit: Payer: Medicaid Other | Admitting: Internal Medicine

## 2019-11-25 ENCOUNTER — Other Ambulatory Visit: Payer: Self-pay | Admitting: Internal Medicine

## 2019-11-25 MED ORDER — CLONIDINE HCL 0.1 MG PO TABS: 0.1000 mg | ORAL_TABLET | Freq: Two times a day (BID) | ORAL | 3 refills | Status: AC

## 2019-11-25 NOTE — Telephone Encounter (Signed)
Pt's medication was sent to pt's pharmacy as requested. Confirmation received.  °

## 2019-12-23 ENCOUNTER — Other Ambulatory Visit: Payer: Self-pay | Admitting: Internal Medicine

## 2019-12-23 MED ORDER — FUROSEMIDE 40 MG PO TABS: 40.0000 mg | ORAL_TABLET | Freq: Every day | ORAL | 0 refills | Status: AC | PRN

## 2019-12-23 NOTE — Telephone Encounter (Signed)
Pt's medication was sent to pt's pharmacy as requested. Confirmation received.  °

## 2020-01-10 DIAGNOSIS — I429 Cardiomyopathy, unspecified: Secondary | ICD-10-CM | POA: Insufficient documentation

## 2020-01-13 ENCOUNTER — Encounter (HOSPITAL_BASED_OUTPATIENT_CLINIC_OR_DEPARTMENT_OTHER): Payer: Self-pay | Admitting: Student in an Organized Health Care Education/Training Program

## 2020-01-15 ENCOUNTER — Telehealth: Payer: Self-pay | Admitting: Internal Medicine

## 2020-01-15 ENCOUNTER — Other Ambulatory Visit: Payer: Self-pay
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
## 2020-01-21 ENCOUNTER — Encounter: Payer: Self-pay | Admitting: Internal Medicine

## 2020-02-13 ENCOUNTER — Ambulatory Visit (HOSPITAL_BASED_OUTPATIENT_CLINIC_OR_DEPARTMENT_OTHER): Payer: Self-pay | Admitting: Student in an Organized Health Care Education/Training Program

## 2020-02-20 ENCOUNTER — Telehealth: Payer: Self-pay

## 2020-02-20 NOTE — Telephone Encounter (Signed)
Pt called wanting a new monitor ordered because she had to move in a rush and her monitor was lost/left and she needs it  So I went ahead and ordered her a new one and she is aware it will take 7-10 business days

## 2020-03-23 ENCOUNTER — Telehealth (HOSPITAL_BASED_OUTPATIENT_CLINIC_OR_DEPARTMENT_OTHER): Payer: Self-pay | Admitting: Psychiatry

## 2020-03-23 NOTE — Telephone Encounter (Signed)
Received call from Krystal Eaton today about patient no showing previously scheduled intake.    Dr. Cato Mulligan stated that the patient says she missed the appointment because she never received a packet (probably be she moved).    I explained to Victorino Dike that the appointment would have only been scheduled directly with the patient and she would have also received a reminder call, so even if the packet did not make it to her due to moving, she still accepted the appointment date and time.    Dr. Cato Mulligan asked about rescheduling. I went over our no show/cancellation policy. She then informed me she thought there was some flexibility with that policy to which I explained that we do make exceptions sometimes due to unforseen circumstances, such as hospitalization, motor vehicle accident, family deaths, etc. -- but do to the number of referrals we receive daily and how far we usually have to book out, we do stick to the policy aside from t hose unforseen circumstances that are out of the patient's control.    I recommended that she refer the patient to [facility] or [facility] as they are both also local offices.

## 2020-08-18 DIAGNOSIS — F4310 Post-traumatic stress disorder, unspecified: Secondary | ICD-10-CM

## 2020-08-18 DIAGNOSIS — K219 Gastro-esophageal reflux disease without esophagitis: Secondary | ICD-10-CM

## 2020-08-18 DIAGNOSIS — F315 Bipolar disorder, current episode depressed, severe, with psychotic features: Secondary | ICD-10-CM

## 2020-08-18 DIAGNOSIS — Z6829 Body mass index (BMI) 29.0-29.9, adult: Secondary | ICD-10-CM

## 2020-08-18 DIAGNOSIS — I5022 Chronic systolic (congestive) heart failure: Secondary | ICD-10-CM

## 2020-08-18 DIAGNOSIS — D509 Iron deficiency anemia, unspecified: Secondary | ICD-10-CM

## 2020-08-18 DIAGNOSIS — F411 Generalized anxiety disorder: Secondary | ICD-10-CM

## 2020-08-19 DIAGNOSIS — R17 Unspecified jaundice: Secondary | ICD-10-CM

## 2020-08-20 ENCOUNTER — Encounter (HOSPITAL_BASED_OUTPATIENT_CLINIC_OR_DEPARTMENT_OTHER): Payer: Self-pay | Admitting: Mental Health

## 2020-08-20 NOTE — Nursing Note (Signed)
Per Bessie she will see this patient for a new patient visit with her on 09/06/20, 09/09/20 at 8:00am or 09/10/20 at 12:00pm    She is seeing the patient as (hospital fup per Dr. Sherryll Burger and Dr. Denton Lank)

## 2020-09-06 ENCOUNTER — Encounter (HOSPITAL_BASED_OUTPATIENT_CLINIC_OR_DEPARTMENT_OTHER): Payer: Self-pay | Admitting: Mental Health

## 2020-09-06 ENCOUNTER — Ambulatory Visit (INDEPENDENT_AMBULATORY_CARE_PROVIDER_SITE_OTHER): Payer: MEDICAID | Admitting: Mental Health

## 2020-09-06 ENCOUNTER — Other Ambulatory Visit: Payer: Self-pay

## 2020-09-06 DIAGNOSIS — F331 Major depressive disorder, recurrent, moderate: Secondary | ICD-10-CM

## 2020-09-06 DIAGNOSIS — F4310 Post-traumatic stress disorder, unspecified: Secondary | ICD-10-CM

## 2020-09-06 DIAGNOSIS — F4489 Other dissociative and conversion disorders: Secondary | ICD-10-CM

## 2020-09-06 NOTE — Patient Instructions (Signed)
If you need to cancel, reschedule, or change a future appointment, please call [phone] to do so.    If you have a specific concern to discuss with me between sessions, you may send me a secure, email message through the Electronic Medical Record system. Log on to [email or web address] to send me an email this way.     You may also call my direct line at [phone]. I have limited time to discuss concerns out of session, and I will do my best to get back to you within 1-2 business days.     If you have an emergency between sessions, please call 911 or go to the local emergency room. If you are in the [place] area, [facility] has a psychiatrist from the Behavioral Medicine Department on call at all times in their ED.    Erlene Devita T. Jimenez MSW, LICSW  License Number:  [id number]

## 2020-09-13 ENCOUNTER — Other Ambulatory Visit: Payer: Self-pay

## 2020-09-13 ENCOUNTER — Ambulatory Visit (INDEPENDENT_AMBULATORY_CARE_PROVIDER_SITE_OTHER): Payer: MEDICAID | Admitting: Student in an Organized Health Care Education/Training Program

## 2020-09-13 ENCOUNTER — Encounter (HOSPITAL_BASED_OUTPATIENT_CLINIC_OR_DEPARTMENT_OTHER): Payer: Self-pay | Admitting: Student in an Organized Health Care Education/Training Program

## 2020-09-13 VITALS — BP 141/100 | HR 89 | Ht 67.0 in | Wt 189.0 lb

## 2020-09-13 DIAGNOSIS — F411 Generalized anxiety disorder: Secondary | ICD-10-CM

## 2020-09-13 DIAGNOSIS — F4310 Post-traumatic stress disorder, unspecified: Secondary | ICD-10-CM

## 2020-09-13 MED ORDER — DIVALPROEX ER 500 MG TABLET,EXTENDED RELEASE 24 HR
ORAL_TABLET | ORAL | 2 refills | Status: DC
Start: 2020-09-13 — End: 2020-10-22

## 2020-09-13 MED ORDER — FLUOXETINE 40 MG CAPSULE
40.0000 mg | ORAL_CAPSULE | Freq: Every day | ORAL | 2 refills | Status: DC
Start: 2020-09-13 — End: 2020-10-22

## 2020-09-13 MED ORDER — OLANZAPINE 10 MG TABLET
10.0000 mg | ORAL_TABLET | Freq: Every evening | ORAL | 2 refills | Status: DC
Start: 2020-09-13 — End: 2020-10-22

## 2020-09-13 NOTE — Progress Notes (Signed)
[facility]  [address] [place] [address]    Date Of Service: 09/13/2020    Natalye Kott  DOB:  [birth year]  MRN:  [id number]       Chief Complaint   Patient presents with    PTSD       Subjective:   This is 54 y.o. yo female who presented to the [facility] medicine outpatient clinic for  Hospital follow up. Patient was admitted from 10/5 to 10/8 at 6E and was diagnosed with PTSD with dicociative features and GAD.  Patient has history of 1 suicide attempt 3 years ago and has been admitted in [place] 3 times and has ECT during those admissions. She moved to [facility] in 2020 and is currently working at [employer]. Patient currently lives alone in [place]. She has 3 adult children and she is close to her youngest daughter. Denies any recreational drug use and occasionally drinks alcohol. Since discharge, patient has established with [facility] for therapy.  Patient has complaint of another personality living inside her called 'Ms Yomira' which takes over when the patient is under stress. Patient states that she has noticed some improvement since discharge and states that 'Ms Linnaea' takes over less often.   Currently patient describes her mood as 'on edge'.  Denies any suicidal or homacidal ideations. Still has complaint of intusive thoughts, avoidance, nightmares and flashbacks. Has complaint of low energy and poor sleep. Endorsed auditory hallucinations involving 'Ms Hyla', denies visual hallucinations.   Patient states has been tolerating current medications and denies having any side effects.     ROS:  Patient denies any symptoms of headache, nausea, vomiting, diarrhea, SOB, and chest pain.     Past Med Trials:  Vraylar, Trazodone, Zoloft, Wellbutrin    Cholecalciferol, Vitamin D3, 50 mcg (2,000 unit) Oral Capsule, Once a day  cloNIDine HCL (CATAPRES) 0.1 mg Oral Tablet, 0.1 mg Twice daily  ENTRESTO 24-26 mg Oral Tablet, Twice daily  metoprolol succinate (TOPROL-XL)  50 mg Oral Tablet Sustained Release 24 hr, Once a day  pantoprazole (PROTONIX) 40 mg Oral Tablet, Delayed Release (E.C.), 40 mg Once a day  propranoloL (INDERAL) 10 mg Oral Tablet, 10 mg Once a day  spironolactone (ALDACTONE) 25 mg Oral Tablet, Once a day  divalproex (DEPAKOTE ER) 500 mg Oral Tablet Sustained Release 24 hr, 1 in am 2 in the eve  OLANZapine (ZYPREXA) 10 mg Oral Tablet, Every night    No facility-administered medications prior to visit.      Mental Status Exam   BP (!) 141/100    Pulse 89    Ht 1.702 m (5\' 7" )    Wt 85.7 kg (189 lb)    BMI 29.60 kg/m       - Alert and oriented to person, place, time and event.  - Patient is dressed in casual clothes and appropriately groomed. Patient appears stated age. Patient maintains good eye contact and is agreeable to discussion.  - Fund of knowledge is good. Patient is able to list 3 previous presidents   - Thought process is coherent, logical, and goal-directed.  - Thought content is void of delusions, suicidal ideation, and homicidal ideation.  - Patient does not have perceptual disturbances, including both auditory and visual hallucinations.  - No loosening of associations.  - Patients stated mood is on edge. Affect is restricted to the dysthymic range.  - Memory is good. Patient is a able recall 3/3 words immediately and at  5 minutes.  - Speech is spontaneous, coherent and of a normal rate, rhythym, and volume with appropriate inflections.  - Language is fluent.  - Patients insight and judgement are both intact and good. The patient has demonstrated a good understanding of their medical condition.  - Concentration is good. Patient is able to spell WORLD forward and backward.  - Gait is normal.          ASSESSMENT    ICD-10-CM    1. PTSD (post-traumatic stress disorder)  F43.10    2. GAD (generalized anxiety disorder)  F41.1          PLAN  - Will increase Prozac to 40 mg Daily this visit.   -Continue Depakote 500 + 1000 and Zyprexa 10 mg HS for now,  plan is to taper off depakote in subsequent visits.    -Medication side effects discussed with patient in great details and patient educated on importance of medication compliance.   -Plan of care discussed with patient. Patient voices understanding and commits to the safety plan    -Patient discussed and seen with Dr. Judithann Sheen.  -Call in between appointments as needed.   -RTC in Return in about 4 weeks (around 10/11/2020) for In Person Visit.   - Call voicemail at [phone] for refills or any questions/concerns  - Call [phone] to set up or change appointments    Orders Placed This Encounter    OLANZapine (ZYPREXA) 10 mg Oral Tablet    divalproex (DEPAKOTE ER) 500 mg Oral Tablet Sustained Release 24 hr    FLUoxetine (PROZAC) 40 mg Oral Capsule       Lucie Leather, MD  09/13/2020, 14:04      ATTENDING NOTE:  I saw and examined the patient 09/13/2020.  I reviewed the resident's note.  I agree with the findings and plan of care as documented in the resident's note.  Any exceptions/additions are edited/noted.  Randolph Bing, MD

## 2020-09-13 NOTE — Patient Instructions (Addendum)
Incraese Prozac to 40 mg daily  Continue Zyprexa 10 mg  Continue Depakote 500 mg and 1000 mg  Voicemail No [phone]

## 2020-09-16 ENCOUNTER — Ambulatory Visit: Payer: MEDICAID | Admitting: Mental Health

## 2020-09-16 ENCOUNTER — Other Ambulatory Visit: Payer: Self-pay

## 2020-09-16 ENCOUNTER — Encounter (HOSPITAL_BASED_OUTPATIENT_CLINIC_OR_DEPARTMENT_OTHER): Payer: Self-pay | Admitting: Mental Health

## 2020-09-16 DIAGNOSIS — F4489 Other dissociative and conversion disorders: Secondary | ICD-10-CM

## 2020-09-16 DIAGNOSIS — F331 Major depressive disorder, recurrent, moderate: Secondary | ICD-10-CM

## 2020-09-16 DIAGNOSIS — F411 Generalized anxiety disorder: Secondary | ICD-10-CM

## 2020-09-16 DIAGNOSIS — F4310 Post-traumatic stress disorder, unspecified: Secondary | ICD-10-CM

## 2020-09-16 NOTE — Progress Notes (Signed)
[facility]  [facility]  [address]   [place] [address]      Name: Jessica Pennington  MRN: [id number]  DOB: Feb 22, 1966  Date of Service: 09/16/2020    Interval History:  Jessica Pennington reported continued depression daily.  She is having difficulty with wanting to engage in things that she enjoyed and wants to isolate when she gets home.  She is also reporting a lot of distress, anxiety and hopelessness associated with her job.  She reported receiving a new medication for depression; however, she has not picked up the prescription.    Intervention:  Explored thoughts and validated emotions associated with depression, normalized increase in symptoms due to move within the last year and adjusting to new environment, loss of friends due to the move, and due to stressful work environment, identified unmet needs to increase awareness of loss of fun, freedom, and so forth, and also increasing awareness that she continues to meet her needs for survival, and love and belongings.  Identified ways that she is already meeting these needs and how all these efforts make a shift in her mood and identified additional ways that she can meet her unmet needs to reduce depression.  Provided information on dissociation and the importance and help fullness of empathizing with internal system to reduce dysregulation and increase integration.  Additionally, encouraged regular contact with her psychiatrist if needed and again job search in an effort to increase hope the reduce depression as well as additional symptoms.    Intervention Response:  Jessica Pennington identified thoughts and the difficulty of living with depression, anxiety, and internal dialogue.  She identified resonating these contributing factors to the increase in depression.  She increased her understanding the impact of isolating in disengaging has on her ability to meet her needs.  She identified helping her neighbor increases her sense of self-worth and  reduces her depression even if slightly.  She identified she would like to help more and meet her needs for fun by going to the movies with her cousin.  She tearfully identified I never realized that she was hurting to, referring to her other part.  She continues to be reluctant to have additional dialogue with the other part however there appeared to be modification and how she thought of this part and increase understanding of her purpose.  Jessica Pennington is going to look into getting her [place] my chart setup.      Plan: To increase understanding of symptoms,  impact of trauma, and increase mindfulness    MENTAL STATUS EXAM:    Orientation:Fully oriented to person, place, time and situation.[name]    Appearance:neatly dressed.    Eye Contact:good.    Behavior:cooperative.    Attention:hypervigilant   Speech:normal rate and volume.    Motor:psychomotor retardation.    Mood:dysphoric.    Affect:congruent to mood.    Thought Process:linear.    Thought Content:no paranoia or delusions.    Suicidal Ideation:passive thoughts--no intent.    HomicidalIdeation:none.    Perception:no hallucinations endorsed   Cognition:good abstract ability.    Insight:good.    Judgement:good.    Attitude:dysphoric.    Riggins Cisek Tadlock-Jimenez, LICSW  09/16/2020, 11:37    Please Note: EPIC requires review of medication in order to close encounter.  Patients medication is reviewed by their physicians who are responsible for prescribing their medications.

## 2020-09-16 NOTE — Patient Instructions (Signed)
If you need to cancel, reschedule, or change a future appointment, please call [phone] to do so.    If you have a specific concern to discuss with me between sessions, you may send me a secure, email message through the Electronic Medical Record system. Log on to [email or web address] to send me an email this way.     You may also call my direct line at [phone]. I have limited time to discuss concerns out of session, and I will do my best to get back to you within 1-2 business days.     If you have an emergency between sessions, please call 911 or go to the local emergency room. If you are in the [place] area, [facility] has a psychiatrist from the Behavioral Medicine Department on call at all times in their ED.    Bevan Vu T. Jimenez MSW, LICSW  License Number:  [id number]
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
## 2020-09-19 NOTE — Progress Notes (Signed)
[facility]  [facility]  [address]  [place] [address]      Patient name:  Roberto Hlavaty  MRN:  [id number]  DOB:  1966/07/10  Date of Service: 09/06/2020      Consent was given to the office by Yvonne Kendall and understands that all co-payments may apply.    Visit Type: Intake Evaluation (billed as individual session. She was recently evaluated by [facility], Dr. Lucie Leather)    Time: 8:05 a.m. Time Out: 9:05 am    Subjective:  See sensitive note for therapy.    Modality:supportive  Frequency of Treatment: weekly.  Prognosis: good    Goals: To identify symptoms and functional impairments to identify medical necessity and treatment needs      Problem List Items Addressed This Visit     None          Raylene Miyamoto, LICSW  09/19/2020, 08:41  [place] Licensed Number: [id number]    Please Note:  Before I could close this encounter in the electronic medical record, the EPIC system asked about my reviewing medications with pt.  A detailed medication review is not a standard part of my psychotherapy sessions with patients and may be considered outside my training/scope of practice.  I indicated "Marked as Reviewed" to close encounter, but this note serves as documentation that I am not managing this patient's medications in any way.

## 2020-09-23 ENCOUNTER — Other Ambulatory Visit: Payer: Self-pay

## 2020-09-23 ENCOUNTER — Ambulatory Visit: Payer: MEDICAID | Admitting: Mental Health

## 2020-09-23 DIAGNOSIS — F4489 Other dissociative and conversion disorders: Secondary | ICD-10-CM

## 2020-09-23 DIAGNOSIS — F331 Major depressive disorder, recurrent, moderate: Secondary | ICD-10-CM

## 2020-09-23 DIAGNOSIS — F411 Generalized anxiety disorder: Secondary | ICD-10-CM

## 2020-09-23 DIAGNOSIS — F4310 Post-traumatic stress disorder, unspecified: Secondary | ICD-10-CM

## 2020-09-24 ENCOUNTER — Ambulatory Visit (HOSPITAL_BASED_OUTPATIENT_CLINIC_OR_DEPARTMENT_OTHER): Payer: Self-pay | Admitting: Mental Health

## 2020-09-24 NOTE — Progress Notes (Unsigned)
[facility]  [facility]  [address]  [place] [address]      Name: Jessica Pennington  MRN: [id number]  DOB: Sep 16, 1966  Date of Service: 09/23/2020    Interval History:  Aleda reported some relief in work related stress this week, for her boss only came in once and when she did she was preoccupied. She reported continued depression and symptoms associated with trauma.     Intervention:  Continued to provide information on trauma and dissociation to increase understanding and normalize her symptoms to reduce self-defeating and distorted thinking, completed a mindfulness exercise and identified things she can do to self-soothe to reduce stress and anxiety to reduce dissociation and splitting to continue to reduce symptoms associated with trauma.     Intervention Response:  Tyrone demonstrated an interest in learning more about her symptoms. She identified, she thought that Ms. Venissa just wanted to take over. She increased her understanding of the importance of reducing stress and anxiety. She identified the exercise to be helpful and identified things she can get and do to help her to engage her senses and self-soothe.     Plan: To increase understanding of symptoms,  impact of trauma, and increase mindfulness    MENTAL STATUS EXAM:    Orientation: Fully oriented to person, place, time and situation.  [name]     Appearance:  neatly dressed.     Eye Contact:  good.     Behavior:  cooperative.     Attention:  hypervigilant    Speech:  normal rate and volume.     Motor:  psychomotor retardation.     Mood:  dysphoric.     Affect:  congruent to mood.     Thought Process:  linear.     Thought Content:  no paranoia or delusions.    Suicidal Ideation:  passive thoughts--no intent.     Homicidal Ideation:  none.      Perception:  no hallucinations endorsed   Cognition:  good abstract ability.     Insight:  good.     Judgement:  good.     Attitude: dysphoric.            Chanele Douglas  Tadlock-Jimenez, LICSW  09/24/2020, 07:38    Please Note: EPIC requires review of medication in order to close encounter.  Patients medication is reviewed by their physicians who are responsible for prescribing their medications.

## 2020-09-30 NOTE — Progress Notes (Signed)
[facility]  [facility]  [address]  [place] [address]      Patient name:  Jessica Pennington  MRN:  [id number]  DOB:  1966-05-24  Date of Service: 09/16/2020    This visit was conducted via Telehealth at the patient's request: Audiovisual  Consent was given to the office by Yvonne Kendall and understands that all co-payments may apply.    Visit Type: Individual Therapy    Time: 8:10 am  Time Out: 9:10 am    Subjective:  See sensitive note for therapy.    Modality:supportive and  Trauma-Informed therapy  Frequency of Treatment: weekly.  Prognosis: good    Goals: To increase understanding of symptoms, increase awareness of and increase ability to meet unmet needs to reduce depression, anxiety, and additional symptoms associated with trauma.       Problem List Items Addressed This Visit     None      Visit Diagnoses     PTSD (post-traumatic stress disorder)    -  Primary    GAD (generalized anxiety disorder)        MDD (major depressive disorder), recurrent episode, moderate (CMS HCC)        Other dissociative and conversion disorders              Raylene Miyamoto, LICSW  09/30/2020, 12:12  [place] Licensed Number: [id number]    Please Note:  Before I could close this encounter in the electronic medical record, the EPIC system asked about my reviewing medications with pt.  A detailed medication review is not a standard part of my psychotherapy sessions with patients and may be considered outside my training/scope of practice.  I indicated "Marked as Reviewed" to close encounter, but this note serves as documentation that I am not managing this patient's medications in any way.

## 2020-09-30 NOTE — Progress Notes (Signed)
[facility]  [address]  [place] [address]      Patient name:  Jessica Pennington  MRN:  [id number]  DOB:  [birth year]  Date of Service: 09/23/2020    This visit was conducted via Telehealth at the patient's request: Audiovisual  Consent was given to the office by Yvonne Kendall and understands that all co-payments may apply.    Visit Type: Individual Therapy    Time: 9:07 a.m.   Time Out: 10:00 a.m.     Subjective:  See sensitive note for therapy.    Modality:supportive and trauma-informed  Frequency of Treatment: weekly.  Prognosis: good    Goals: To increase understanding of symptoms, trauma, and mindfulness to reduce symptoms associated with depression and trauma      Problem List Items Addressed This Visit     None      Visit Diagnoses     PTSD (post-traumatic stress disorder)    -  Primary    GAD (generalized anxiety disorder)        MDD (major depressive disorder), recurrent episode, moderate (CMS HCC)        Other dissociative and conversion disorders              Raylene Miyamoto, LICSW  09/30/2020, 11:04  [place] Licensed Number: [id number]    Please Note:  Before I could close this encounter in the electronic medical record, the EPIC system asked about my reviewing medications with pt.  A detailed medication review is not a standard part of my psychotherapy sessions with patients and may be considered outside my training/scope of practice.  I indicated "Marked as Reviewed" to close encounter, but this note serves as documentation that I am not managing this patient's medications in any way.

## 2020-09-30 NOTE — Patient Instructions (Signed)
If you need to cancel, reschedule, or change a future appointment, please call [phone] to do so.    If you have a specific concern to discuss with me between sessions, you may send me a secure, email message through the Electronic Medical Record system. Log on to [email or web address] to send me an email this way.     You may also call my direct line at [phone]. I have limited time to discuss concerns out of session, and I will do my best to get back to you within 1-2 business days.     If you have an emergency between sessions, please call 911 or go to the local emergency room. If you are in the [place] area, [facility] has a psychiatrist from the Behavioral Medicine Department on call at all times in their ED.     Knotek T. Jimenez MSW, LICSW  License Number:  [id number]

## 2020-10-13 ENCOUNTER — Other Ambulatory Visit: Payer: Self-pay

## 2020-10-13 ENCOUNTER — Ambulatory Visit (INDEPENDENT_AMBULATORY_CARE_PROVIDER_SITE_OTHER): Payer: MEDICAID | Admitting: Mental Health

## 2020-10-13 DIAGNOSIS — F331 Major depressive disorder, recurrent, moderate: Secondary | ICD-10-CM

## 2020-10-13 DIAGNOSIS — F4489 Other dissociative and conversion disorders: Secondary | ICD-10-CM

## 2020-10-13 DIAGNOSIS — F411 Generalized anxiety disorder: Secondary | ICD-10-CM

## 2020-10-13 DIAGNOSIS — F4310 Post-traumatic stress disorder, unspecified: Secondary | ICD-10-CM

## 2020-10-15 NOTE — Progress Notes (Signed)
[facility]  [facility]  [address]  [place] [address]      Name: Jessica Pennington  MRN: [id number]  DOB: [birth year]  Date of Service: 10/13/2020    Interval History:  Terryl reported an increase in depression since she quit her job two weeks ago. She reported continued splitting and dissociation. She reported difficulty getting to sleep due to multiple voices.     Intervention:  Explored thoughts, validated emotions, provided information on dissociative disorder, the role of parts, the importance of slowly increasing dialogue, and discussed additional resources to reduce anxiety, depression, and additional trauma related symptoms.     Intervention Response:  Siena was tearful as she described the difficulty of living with depression every day for the last two weeks. She identified a continued desire to ignore other part, continuing to fear making her mad. She identified more of an openness and understanding through the use of analogies to explain why dialoguing is helpful. She expressed gratitude for resource.        Jakwan Sally Tadlock-Jimenez, LICSW  10/15/2020, 07:31    Please Note: EPIC requires review of medication in order to close encounter.  Patients medication is reviewed by their physicians who are responsible for prescribing their medications.

## 2020-10-15 NOTE — Patient Instructions (Signed)
If you need to cancel, reschedule, or change a future appointment, please call [phone] to do so.    If you have a specific concern to discuss with me between sessions, you may send me a secure, email message through the Electronic Medical Record system. Log on to [email or web address] to send me an email this way.     You may also call my direct line at [phone]. I have limited time to discuss concerns out of session, and I will do my best to get back to you within 1-2 business days.     If you have an emergency between sessions, please call 911 or go to the local emergency room. If you are in the [place] area, [facility] has a psychiatrist from the Behavioral Medicine Department on call at all times in their ED.    Tinesha Siegrist T. Jimenez MSW, LICSW  License Number:  [id number]

## 2020-10-15 NOTE — Progress Notes (Signed)
[facility]  [address]  [place] [address]      Patient name:  Jessica Pennington  MRN:  [id number]  DOB:  [birth year]  Date of Service: 10/13/2020    This visit was conducted via Telehealth at the patient's request: Audiovisual  Consent was given to the office by Yvonne Kendall and understands that all co-payments may apply.    Visit Type: Individual Therapy    Time: 12:16 p.m.  Time Out: 1:05    Subjective:  See sensitive note for therapy.    Modality:supportive and trauma-informed therapy  Frequency of Treatment: weekly.  Prognosis: good    Goals: To normalize and increase understanding of symptoms and provided information on methods of reduction to reduce fragmentation to reduce anxiety, depression, and additional symptoms associated with trauma.       Problem List Items Addressed This Visit     None      Visit Diagnoses     PTSD (post-traumatic stress disorder)    -  Primary    GAD (generalized anxiety disorder)        MDD (major depressive disorder), recurrent episode, moderate (CMS HCC)        Other dissociative and conversion disorders              Raylene Miyamoto, LICSW  10/15/2020, 07:39  [place] Licensed Number: [id number]    Please Note:  Before I could close this encounter in the electronic medical record, the EPIC system asked about my reviewing medications with pt.  A detailed medication review is not a standard part of my psychotherapy sessions with patients and may be considered outside my training/scope of practice.  I indicated "Marked as Reviewed" to close encounter, but this note serves as documentation that I am not managing this patient's medications in any way.

## 2020-10-20 ENCOUNTER — Other Ambulatory Visit: Payer: Self-pay

## 2020-10-22 ENCOUNTER — Ambulatory Visit (INDEPENDENT_AMBULATORY_CARE_PROVIDER_SITE_OTHER): Payer: MEDICAID | Admitting: Student in an Organized Health Care Education/Training Program

## 2020-10-22 ENCOUNTER — Encounter (HOSPITAL_BASED_OUTPATIENT_CLINIC_OR_DEPARTMENT_OTHER): Payer: Self-pay | Admitting: Student in an Organized Health Care Education/Training Program

## 2020-10-22 ENCOUNTER — Other Ambulatory Visit: Payer: Self-pay

## 2020-10-22 VITALS — BP 148/109 | HR 84 | Ht 67.0 in | Wt 189.0 lb

## 2020-10-22 DIAGNOSIS — F4310 Post-traumatic stress disorder, unspecified: Secondary | ICD-10-CM

## 2020-10-22 DIAGNOSIS — F411 Generalized anxiety disorder: Secondary | ICD-10-CM

## 2020-10-22 DIAGNOSIS — F4489 Other dissociative and conversion disorders: Secondary | ICD-10-CM

## 2020-10-22 MED ORDER — FLUOXETINE 20 MG CAPSULE
60.0000 mg | ORAL_CAPSULE | Freq: Every day | ORAL | 2 refills | Status: AC
Start: 2020-10-22 — End: 2020-11-21

## 2020-10-22 MED ORDER — DIVALPROEX ER 500 MG TABLET,EXTENDED RELEASE 24 HR
ORAL_TABLET | ORAL | 2 refills | Status: AC
Start: 2020-10-22 — End: ?

## 2020-10-22 MED ORDER — OLANZAPINE 10 MG TABLET
10.0000 mg | ORAL_TABLET | Freq: Every evening | ORAL | 2 refills | Status: AC
Start: 2020-10-22 — End: 2020-11-21

## 2020-10-22 NOTE — Patient Instructions (Signed)
Increase Prozac to 60 mg OD  Continue Depakote and Zyprexa at current doses  Follow up in 1 week.  Voicemail no [phone]

## 2020-10-22 NOTE — Progress Notes (Signed)
[facility]  [address] [place] [address]    Date Of Service: 10/22/2020    Jessica Pennington  DOB:  01-06-1966  MRN:  [id number]       Chief Complaint   Patient presents with    Medication Check       Subjective:   This is 54 y.o. yo female who presented to the [facility] medicine outpatient clinic for follow up visit. 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   Patient's Prozac was increased to 40 mg OD on her last visit. Patient does not report significant improvement in her mood with the increase. Patient states that she finds therapy helpul. Patient states that she has changed her job recently and she is now working for [employer].   Patient states that '[name]' still takes over occasionally. Patient reports low energy, easy fatiguability and poor appetite. Endorsed frequent nightmares. States that he anxiety has been 'horrible' lately. States that her mood has been 'not so good'. Denies suicidal or homacidal ideations. Endorsed auditory hallucinations involving '[name]'. Denies visual hallucinations.   Patient states has been tolerating current medications and denies having any side effects.     ROS:  Patient denies any symptoms of headache, nausea, vomiting, diarrhea, SOB, and chest pain.       Cholecalciferol, Vitamin D3, 50 mcg (2,000 unit) Oral Capsule, Once a day  cloNIDine HCL (CATAPRES) 0.1 mg Oral Tablet, 0.1 mg Twice daily  ENTRESTO 24-26 mg Oral Tablet, Twice daily  metoprolol succinate (TOPROL-XL) 50 mg Oral Tablet Sustained Release 24 hr, Once a day  pantoprazole (PROTONIX) 40 mg Oral Tablet, Delayed Release (E.C.), 40 mg Once a day  propranoloL (INDERAL) 10 mg Oral Tablet, 10 mg Once a day  spironolactone (ALDACTONE) 25 mg Oral Tablet, Once a day  divalproex (DEPAKOTE ER) 500 mg Oral Tablet Sustained Release 24 hr, 1 in the morning and 2 in the evening  FLUoxetine (PROZAC) 40 mg Oral Capsule, Take 1 Capsule (40 mg total) by  mouth Once a day for 30 days  OLANZapine (ZYPREXA) 10 mg Oral Tablet, Take 1 Tablet (10 mg total) by mouth Every night for 30 days    No facility-administered medications prior to visit.      Mental Status Exam   BP (!) 148/109    Pulse 84    Ht 1.702 m (5\' 7" )    Wt 85.7 kg (189 lb)    BMI 29.60 kg/m       - Alert and oriented to person, place, time and event.  - Patient is dressed in casual clothes and appropriately groomed. Patient appears stated age. Patient maintains good eye contact and is agreeable to discussion.  - Fund of knowledge is good. Patient is able to list 3 previous presidents and can name 5/5 states that border .  - Thought process is coherent, logical, and goal-directed.  - Thought content is void of delusions, suicidal ideation, and homicidal ideation.  - Patient does not have perceptual disturbances, including both auditory and visual hallucinations.  - No loosening of associations.  - Patients stated mood is Not so good. Affect is restricted to the dysthymic range and congruent with the stated content of mood.  - Memory is good. Patient is a able recall 3/3 words immediately and at 5 minutes. Patient is able to recall what they had for lunch yesterday.  - Speech is spontaneous, coherent and of a normal rate, rhythym,  and volume with appropriate inflections.  - Language is fluent.  - Patients insight and judgement are both intact and good. The patient has demonstrated a good understanding of their medical condition.  - Concentration is good. Patient is able to spell WORLD forward and backward. Able to complete serial 7s and maintain focus throughout conversation.  - Gait is normal.          ASSESSMENT    ICD-10-CM    1. PTSD (post-traumatic stress disorder)  F43.10    2. 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  - Continue rest of the medications at current  doses for now. Plan is to taper off Depakote and switch the patient to Abilify from Zyprexa in the future.  -Medication side effects discussed with patient in great details and patient educated on importance of medication compliance.   -Plan of care discussed with patient. Patient voices understanding and commits to the safety plan    -Patient discussed and seen with Dr. Judithann Sheen.  -Call in between appointments as needed.   -RTC in Return in about 4 weeks (around 11/19/2020) for In Person Visit.   - Call voicemail at [phone] for refills or any questions/concerns  - Call [phone] to set up or change appointments    Orders Placed This Encounter    divalproex (DEPAKOTE ER) 500 mg Oral Tablet Sustained Release 24 hr    OLANZapine (ZYPREXA) 10 mg Oral Tablet    FLUoxetine (PROZAC) 20 mg Oral Capsule       Lucie Leather, MD  10/22/2020, 10:03    ATTENDING NOTE:  I saw and examined the patient 10/22/2020.  I reviewed the resident's note.  I agree with the findings and plan of care as documented in the resident's note.  Any exceptions/additions are edited/noted.  Randolph Bing, MD

## 2020-12-06 ENCOUNTER — Encounter (HOSPITAL_BASED_OUTPATIENT_CLINIC_OR_DEPARTMENT_OTHER): Payer: MEDICAID | Admitting: Student in an Organized Health Care Education/Training Program

## 2020-12-06 ENCOUNTER — Other Ambulatory Visit: Payer: Self-pay

## 2020-12-07 ENCOUNTER — Telehealth (HOSPITAL_BASED_OUTPATIENT_CLINIC_OR_DEPARTMENT_OTHER): Payer: Self-pay | Admitting: Mental Health

## 2020-12-10 ENCOUNTER — Encounter (INDEPENDENT_AMBULATORY_CARE_PROVIDER_SITE_OTHER): Payer: Self-pay | Admitting: Student in an Organized Health Care Education/Training Program

## 2020-12-13 NOTE — Progress Notes (Signed)
Routed to [facility] for processing.

## 2021-01-06 ENCOUNTER — Other Ambulatory Visit: Payer: Self-pay

## 2021-01-06 ENCOUNTER — Ambulatory Visit: Payer: MEDICAID | Admitting: Mental Health

## 2021-01-06 DIAGNOSIS — F4489 Other dissociative and conversion disorders: Secondary | ICD-10-CM

## 2021-01-06 DIAGNOSIS — F4310 Post-traumatic stress disorder, unspecified: Secondary | ICD-10-CM

## 2021-01-06 DIAGNOSIS — F411 Generalized anxiety disorder: Secondary | ICD-10-CM

## 2021-01-06 NOTE — Progress Notes (Signed)
[facility]  [address]  [place] [address]      Patient name:  Jessica Pennington  MRN:  [id number]  DOB:  Jan 12, 1966  Date of Service: 01/06/2021    This visit was conducted via Telehealth at the patient's request: Audiovisual  Consent was given to the office by Yvonne Kendall and understands that all co-payments may apply.    Visit Type: Individual Therapy    Time: 11:05 am  Time Out: 12:05 pm    Subjective:  See sensitive note for therapy.    Modality:supportive and trauma-informed therapy  Frequency of Treatment: weekly.  Prognosis: good    Goals: To increase understanding of symptoms, increase mindfulness, and emotional regulation to reduce anxiety, depression, and additional symptoms associated with trauma.       Problem List Items Addressed This Visit    None     Visit Diagnoses     PTSD (post-traumatic stress disorder)    -  Primary    GAD (generalized anxiety disorder)        Other dissociative and conversion disorders              Raylene Miyamoto, LICSW  01/06/2021, 14:47  [place] Licensed Number: [id number]    Please Note:  Before I could close this encounter in the electronic medical record, the EPIC system asked about my reviewing medications with pt.  A detailed medication review is not a standard part of my psychotherapy sessions with patients and may be considered outside my training/scope of practice.  I indicated "Marked as Reviewed" to close encounter, but this note serves as documentation that I am not managing this patient's medications in any way.

## 2021-01-06 NOTE — Progress Notes (Signed)
[facility]  [address]  [place] [address]      Name: Jessica Pennington  MRN: [id number]  DOB: May 26, 1966  Date of Service: 01/06/2021    Interval History:  Jessica Pennington reported reading information provided on dissociation and dissociative identity disorder. She reported, "I just want to get rid of her". She is experiencing a high level of anxiety, always on edge, high startle response, always looking over her shoulder,and gets startle when she sees things in her peripheral vision.     Intervention:  Provided additional information on depersonalization, derealization, and about and treatment approaches for Dissociative Identify Disorder, explored symptoms to continue to increase understanding and the functioning of other part, and did a short demonstration of and provided information about the mind/body connection, provided information on willing hands and half-smile as techniques to increase mindfulness and emotional regulation to continue to reduce symptoms symptoms associated with anxiety, depression, and trauma.       Intervention Response:  She increased her understanding of symptoms. She reported having both depersonalization and derealization. She identified, it is really scary when she switches and is observing her other part. She identified beginning with ways to reduce her anxiety and stress would be helpful for her and to reduce switching. She identified, it is often being overwhelmed with anxiety that leads to depression.     Plan: Provide techniques to increase reduce stress and anxiety, and continue to increase understanding of symptoms.           Jessica Pennington, LICSW  01/06/2021, 14:35    Please Note: EPIC requires review of medication in order to close encounter.  Patients medication is reviewed by their physicians who are responsible for prescribing their medications.

## 2021-01-06 NOTE — Patient Instructions (Signed)
If you need to cancel, reschedule, or change a future appointment, please call [phone] to do so.    If you have a specific concern to discuss with me between sessions, you may send me a secure, email message through the Electronic Medical Record system. Log on to [email or web address] to send me an email this way.     You may also call my direct line at [phone]. I have limited time to discuss concerns out of session, and I will do my best to get back to you within 1-2 business days.     If you have an emergency between sessions, please call 911 or go to the local emergency room. If you are in the [place] area, [facility] has a psychiatrist from the Behavioral Medicine Department on call at all times in their ED.    Nydia Ytuarte T. Jimenez MSW, LICSW  License Number:  [id number]

## 2021-01-14 ENCOUNTER — Ambulatory Visit (HOSPITAL_BASED_OUTPATIENT_CLINIC_OR_DEPARTMENT_OTHER): Payer: MEDICAID | Admitting: Mental Health

## 2021-01-31 ENCOUNTER — Encounter (HOSPITAL_BASED_OUTPATIENT_CLINIC_OR_DEPARTMENT_OTHER): Payer: MEDICAID | Admitting: Student in an Organized Health Care Education/Training Program

## 2021-02-02 ENCOUNTER — Encounter (INDEPENDENT_AMBULATORY_CARE_PROVIDER_SITE_OTHER): Payer: Self-pay | Admitting: Mental Health

## 2021-02-08 ENCOUNTER — Telehealth (HOSPITAL_BASED_OUTPATIENT_CLINIC_OR_DEPARTMENT_OTHER): Payer: Self-pay | Admitting: Mental Health

## 2021-02-08 NOTE — Telephone Encounter (Signed)
Called to check on patient and to discuss record request.

## 2021-02-10 NOTE — Progress Notes (Signed)
Request submitted to provider for review.

## 2021-02-14 NOTE — Progress Notes (Signed)
Routed to [facility] for processing.

## 2021-02-14 NOTE — Progress Notes (Signed)
Tadlock-Jimenez, Bessie, LICSW  McCallister, Tim D  Hi Tim, thank you! I have tried calling her 3 times and have left voicemails with no return call. Please send the records.     Thank you for your efforts to help [name]!   -Bessie             Previous Messages       ----- Message -----   From: Junius Finner D   Sent: 02/10/2021   4:18 PM EDT   To: Bessie Tadlock-Jimenez, LICSW   Subject: RE: Request for therapy notes                     Just following up with you to see if you were able to speak with Ms. Gullickson?  No rush I just like to follow up to make sure things are not forgotten.     Tim   ----- Message -----   From: Raylene Miyamoto, LICSW   Sent: 02/02/2021   4:49 PM EDT   To: Christ Kick McCallister   Subject: RE: Request for therapy notes                     Hi Tim,     I just let Ms. Everingham a Engineer, technical sales. I will check with her and see what she would prefer that I do. If I do not hear from her, then I will likely write a letter.     Thank you for your message!

## 2021-03-30 ENCOUNTER — Other Ambulatory Visit (HOSPITAL_BASED_OUTPATIENT_CLINIC_OR_DEPARTMENT_OTHER): Payer: Self-pay | Admitting: Student in an Organized Health Care Education/Training Program

## 2021-05-20 ENCOUNTER — Other Ambulatory Visit (HOSPITAL_BASED_OUTPATIENT_CLINIC_OR_DEPARTMENT_OTHER): Payer: Self-pay | Admitting: Student in an Organized Health Care Education/Training Program

## 2021-12-08 ENCOUNTER — Other Ambulatory Visit (HOSPITAL_BASED_OUTPATIENT_CLINIC_OR_DEPARTMENT_OTHER): Payer: Self-pay | Admitting: Student in an Organized Health Care Education/Training Program
